# Patient Record
Sex: Female | Born: 1950 | Race: White | Hispanic: No | Smoking: Former smoker
Health system: Southern US, Community
[De-identification: ages and names within clinical notes are randomized; demographics above are authoritative.]

## PROBLEM LIST (undated history)

## (undated) DIAGNOSIS — I4819 Other persistent atrial fibrillation: Secondary | ICD-10-CM

## (undated) DIAGNOSIS — C50919 Malignant neoplasm of unspecified site of unspecified female breast: Secondary | ICD-10-CM

## (undated) DIAGNOSIS — T7840XA Allergy, unspecified, initial encounter: Secondary | ICD-10-CM

## (undated) DIAGNOSIS — E119 Type 2 diabetes mellitus without complications: Secondary | ICD-10-CM

## (undated) DIAGNOSIS — S329XXA Fracture of unspecified parts of lumbosacral spine and pelvis, initial encounter for closed fracture: Secondary | ICD-10-CM

## (undated) DIAGNOSIS — K644 Residual hemorrhoidal skin tags: Secondary | ICD-10-CM

## (undated) DIAGNOSIS — Z9889 Other specified postprocedural states: Secondary | ICD-10-CM

## (undated) DIAGNOSIS — D175 Benign lipomatous neoplasm of intra-abdominal organs: Secondary | ICD-10-CM

## (undated) DIAGNOSIS — Z8679 Personal history of other diseases of the circulatory system: Secondary | ICD-10-CM

## (undated) DIAGNOSIS — J9600 Acute respiratory failure, unspecified whether with hypoxia or hypercapnia: Secondary | ICD-10-CM

## (undated) DIAGNOSIS — D126 Benign neoplasm of colon, unspecified: Secondary | ICD-10-CM

## (undated) DIAGNOSIS — I4892 Unspecified atrial flutter: Secondary | ICD-10-CM

## (undated) DIAGNOSIS — I10 Essential (primary) hypertension: Secondary | ICD-10-CM

## (undated) DIAGNOSIS — N179 Acute kidney failure, unspecified: Secondary | ICD-10-CM

## (undated) DIAGNOSIS — E785 Hyperlipidemia, unspecified: Secondary | ICD-10-CM

## (undated) DIAGNOSIS — F419 Anxiety disorder, unspecified: Secondary | ICD-10-CM

## (undated) DIAGNOSIS — D649 Anemia, unspecified: Secondary | ICD-10-CM

## (undated) DIAGNOSIS — K648 Other hemorrhoids: Secondary | ICD-10-CM

## (undated) DIAGNOSIS — G4730 Sleep apnea, unspecified: Secondary | ICD-10-CM

## (undated) DIAGNOSIS — IMO0001 Reserved for inherently not codable concepts without codable children: Secondary | ICD-10-CM

## (undated) DIAGNOSIS — M797 Fibromyalgia: Secondary | ICD-10-CM

## (undated) DIAGNOSIS — IMO0002 Reserved for concepts with insufficient information to code with codable children: Secondary | ICD-10-CM

## (undated) DIAGNOSIS — R112 Nausea with vomiting, unspecified: Secondary | ICD-10-CM

## (undated) DIAGNOSIS — J449 Chronic obstructive pulmonary disease, unspecified: Secondary | ICD-10-CM

## (undated) DIAGNOSIS — Z86010 Personal history of colonic polyps: Secondary | ICD-10-CM

## (undated) DIAGNOSIS — K449 Diaphragmatic hernia without obstruction or gangrene: Secondary | ICD-10-CM

## (undated) DIAGNOSIS — K219 Gastro-esophageal reflux disease without esophagitis: Secondary | ICD-10-CM

## (undated) DIAGNOSIS — M1990 Unspecified osteoarthritis, unspecified site: Secondary | ICD-10-CM

## (undated) DIAGNOSIS — J189 Pneumonia, unspecified organism: Secondary | ICD-10-CM

## (undated) DIAGNOSIS — K5790 Diverticulosis of intestine, part unspecified, without perforation or abscess without bleeding: Secondary | ICD-10-CM

## (undated) HISTORY — DX: Reserved for concepts with insufficient information to code with codable children: IMO0002

## (undated) HISTORY — DX: Hyperlipidemia, unspecified: E78.5

## (undated) HISTORY — PX: DILATION AND CURETTAGE OF UTERUS: SHX78

## (undated) HISTORY — PX: ROTATOR CUFF REPAIR: SHX139

## (undated) HISTORY — DX: Gastro-esophageal reflux disease without esophagitis: K21.9

## (undated) HISTORY — DX: Diaphragmatic hernia without obstruction or gangrene: K44.9

## (undated) HISTORY — DX: Chronic obstructive pulmonary disease, unspecified: J44.9

## (undated) HISTORY — DX: Benign lipomatous neoplasm of intra-abdominal organs: D17.5

## (undated) HISTORY — DX: Acute kidney failure, unspecified: N17.9

## (undated) HISTORY — PX: POLYPECTOMY: SHX149

## (undated) HISTORY — DX: Malignant neoplasm of unspecified site of unspecified female breast: C50.919

## (undated) HISTORY — DX: Residual hemorrhoidal skin tags: K64.4

## (undated) HISTORY — PX: TOTAL KNEE ARTHROPLASTY: SHX125

## (undated) HISTORY — DX: Benign neoplasm of colon, unspecified: D12.6

## (undated) HISTORY — DX: Unspecified osteoarthritis, unspecified site: M19.90

## (undated) HISTORY — PX: COLONOSCOPY: SHX174

## (undated) HISTORY — DX: Personal history of colonic polyps: Z86.010

## (undated) HISTORY — PX: JOINT REPLACEMENT: SHX530

## (undated) HISTORY — DX: Fracture of unspecified parts of lumbosacral spine and pelvis, initial encounter for closed fracture: S32.9XXA

## (undated) HISTORY — DX: Anxiety disorder, unspecified: F41.9

## (undated) HISTORY — DX: Anemia, unspecified: D64.9

## (undated) HISTORY — DX: Reserved for inherently not codable concepts without codable children: IMO0001

## (undated) HISTORY — DX: Residual hemorrhoidal skin tags: K64.8

## (undated) HISTORY — PX: UPPER GASTROINTESTINAL ENDOSCOPY: SHX188

## (undated) HISTORY — DX: Unspecified atrial flutter: I48.92

## (undated) HISTORY — DX: Essential (primary) hypertension: I10

## (undated) HISTORY — DX: Allergy, unspecified, initial encounter: T78.40XA

## (undated) HISTORY — DX: Acute respiratory failure, unspecified whether with hypoxia or hypercapnia: J96.00

## (undated) HISTORY — PX: ESOPHAGOGASTRODUODENOSCOPY: SHX1529

## (undated) HISTORY — DX: Diverticulosis of intestine, part unspecified, without perforation or abscess without bleeding: K57.90

## (undated) HISTORY — DX: Other persistent atrial fibrillation: I48.19

---

## 1991-09-26 DIAGNOSIS — D126 Benign neoplasm of colon, unspecified: Secondary | ICD-10-CM

## 1991-09-26 HISTORY — DX: Benign neoplasm of colon, unspecified: D12.6

## 1993-06-30 ENCOUNTER — Encounter: Payer: Self-pay | Admitting: Internal Medicine

## 1999-07-22 ENCOUNTER — Other Ambulatory Visit: Admission: RE | Admit: 1999-07-22 | Discharge: 1999-07-22 | Payer: Self-pay | Admitting: Obstetrics and Gynecology

## 2000-07-26 ENCOUNTER — Encounter (INDEPENDENT_AMBULATORY_CARE_PROVIDER_SITE_OTHER): Payer: Self-pay | Admitting: Gastroenterology

## 2000-10-17 ENCOUNTER — Other Ambulatory Visit: Admission: RE | Admit: 2000-10-17 | Discharge: 2000-10-17 | Payer: Self-pay | Admitting: Obstetrics and Gynecology

## 2001-02-13 ENCOUNTER — Encounter: Admission: RE | Admit: 2001-02-13 | Discharge: 2001-02-13 | Payer: Self-pay | Admitting: Obstetrics and Gynecology

## 2001-02-13 ENCOUNTER — Encounter: Payer: Self-pay | Admitting: Obstetrics and Gynecology

## 2001-05-10 ENCOUNTER — Encounter: Payer: Self-pay | Admitting: Obstetrics and Gynecology

## 2001-05-10 ENCOUNTER — Encounter: Admission: RE | Admit: 2001-05-10 | Discharge: 2001-05-10 | Payer: Self-pay | Admitting: Obstetrics and Gynecology

## 2001-12-18 ENCOUNTER — Emergency Department (HOSPITAL_COMMUNITY): Admission: EM | Admit: 2001-12-18 | Discharge: 2001-12-18 | Payer: Self-pay | Admitting: Emergency Medicine

## 2001-12-18 ENCOUNTER — Encounter: Payer: Self-pay | Admitting: Emergency Medicine

## 2002-02-13 ENCOUNTER — Other Ambulatory Visit: Admission: RE | Admit: 2002-02-13 | Discharge: 2002-02-13 | Payer: Self-pay | Admitting: Obstetrics and Gynecology

## 2002-04-22 ENCOUNTER — Encounter: Admission: RE | Admit: 2002-04-22 | Discharge: 2002-04-22 | Payer: Self-pay | Admitting: Obstetrics and Gynecology

## 2002-04-22 ENCOUNTER — Encounter: Payer: Self-pay | Admitting: Obstetrics and Gynecology

## 2002-05-14 ENCOUNTER — Encounter: Admission: RE | Admit: 2002-05-14 | Discharge: 2002-05-14 | Payer: Self-pay | Admitting: Obstetrics and Gynecology

## 2002-05-14 ENCOUNTER — Encounter: Payer: Self-pay | Admitting: Obstetrics and Gynecology

## 2004-06-02 ENCOUNTER — Other Ambulatory Visit: Admission: RE | Admit: 2004-06-02 | Discharge: 2004-06-02 | Payer: Self-pay | Admitting: Obstetrics and Gynecology

## 2004-08-02 ENCOUNTER — Ambulatory Visit: Payer: Self-pay | Admitting: Internal Medicine

## 2004-09-23 ENCOUNTER — Ambulatory Visit: Payer: Self-pay | Admitting: Internal Medicine

## 2004-10-21 ENCOUNTER — Ambulatory Visit: Payer: Self-pay | Admitting: Internal Medicine

## 2004-12-07 ENCOUNTER — Encounter: Admission: RE | Admit: 2004-12-07 | Discharge: 2004-12-07 | Payer: Self-pay | Admitting: Obstetrics and Gynecology

## 2005-02-01 ENCOUNTER — Other Ambulatory Visit: Admission: RE | Admit: 2005-02-01 | Discharge: 2005-02-01 | Payer: Self-pay | Admitting: Obstetrics and Gynecology

## 2005-02-01 ENCOUNTER — Other Ambulatory Visit: Admission: RE | Admit: 2005-02-01 | Discharge: 2005-02-03 | Payer: Self-pay | Admitting: Physician Assistant

## 2005-12-04 ENCOUNTER — Ambulatory Visit: Payer: Self-pay | Admitting: Internal Medicine

## 2006-05-21 ENCOUNTER — Ambulatory Visit: Payer: Self-pay | Admitting: Internal Medicine

## 2006-09-08 ENCOUNTER — Emergency Department (HOSPITAL_COMMUNITY): Admission: EM | Admit: 2006-09-08 | Discharge: 2006-09-08 | Payer: Self-pay | Admitting: Emergency Medicine

## 2006-09-24 ENCOUNTER — Ambulatory Visit: Payer: Self-pay | Admitting: Internal Medicine

## 2006-10-23 ENCOUNTER — Inpatient Hospital Stay (HOSPITAL_COMMUNITY): Admission: RE | Admit: 2006-10-23 | Discharge: 2006-10-26 | Payer: Self-pay | Admitting: Orthopedic Surgery

## 2006-12-28 ENCOUNTER — Ambulatory Visit: Admission: RE | Admit: 2006-12-28 | Discharge: 2006-12-28 | Payer: Self-pay | Admitting: Orthopedic Surgery

## 2007-01-28 ENCOUNTER — Inpatient Hospital Stay (HOSPITAL_COMMUNITY): Admission: RE | Admit: 2007-01-28 | Discharge: 2007-01-31 | Payer: Self-pay | Admitting: Orthopedic Surgery

## 2007-03-07 ENCOUNTER — Ambulatory Visit (HOSPITAL_COMMUNITY): Admission: RE | Admit: 2007-03-07 | Discharge: 2007-03-07 | Payer: Self-pay | Admitting: Orthopedic Surgery

## 2007-03-07 ENCOUNTER — Ambulatory Visit: Payer: Self-pay | Admitting: Vascular Surgery

## 2007-10-03 ENCOUNTER — Ambulatory Visit: Payer: Self-pay | Admitting: Internal Medicine

## 2007-10-03 ENCOUNTER — Telehealth (INDEPENDENT_AMBULATORY_CARE_PROVIDER_SITE_OTHER): Payer: Self-pay | Admitting: *Deleted

## 2007-10-03 DIAGNOSIS — K219 Gastro-esophageal reflux disease without esophagitis: Secondary | ICD-10-CM | POA: Insufficient documentation

## 2007-10-03 DIAGNOSIS — J45991 Cough variant asthma: Secondary | ICD-10-CM

## 2007-10-10 ENCOUNTER — Ambulatory Visit: Payer: Self-pay | Admitting: Internal Medicine

## 2007-10-17 ENCOUNTER — Ambulatory Visit: Payer: Self-pay | Admitting: Internal Medicine

## 2007-11-20 ENCOUNTER — Encounter: Payer: Self-pay | Admitting: Internal Medicine

## 2008-01-16 ENCOUNTER — Ambulatory Visit: Payer: Self-pay | Admitting: Internal Medicine

## 2008-02-04 ENCOUNTER — Telehealth (INDEPENDENT_AMBULATORY_CARE_PROVIDER_SITE_OTHER): Payer: Self-pay | Admitting: *Deleted

## 2008-06-02 ENCOUNTER — Ambulatory Visit: Payer: Self-pay | Admitting: Internal Medicine

## 2008-06-09 ENCOUNTER — Ambulatory Visit: Payer: Self-pay | Admitting: Internal Medicine

## 2008-06-09 ENCOUNTER — Ambulatory Visit: Payer: Self-pay | Admitting: Pulmonary Disease

## 2008-06-15 ENCOUNTER — Ambulatory Visit: Payer: Self-pay | Admitting: Internal Medicine

## 2008-06-23 ENCOUNTER — Telehealth (INDEPENDENT_AMBULATORY_CARE_PROVIDER_SITE_OTHER): Payer: Self-pay | Admitting: *Deleted

## 2008-07-01 ENCOUNTER — Ambulatory Visit: Payer: Self-pay | Admitting: Internal Medicine

## 2008-07-01 DIAGNOSIS — Z860100 Personal history of colon polyps, unspecified: Secondary | ICD-10-CM | POA: Insufficient documentation

## 2008-07-01 DIAGNOSIS — R1319 Other dysphagia: Secondary | ICD-10-CM

## 2008-09-07 ENCOUNTER — Telehealth: Payer: Self-pay | Admitting: Internal Medicine

## 2008-11-30 ENCOUNTER — Telehealth (INDEPENDENT_AMBULATORY_CARE_PROVIDER_SITE_OTHER): Payer: Self-pay

## 2009-01-15 ENCOUNTER — Telehealth: Payer: Self-pay | Admitting: Internal Medicine

## 2009-02-23 ENCOUNTER — Ambulatory Visit: Payer: Self-pay | Admitting: Internal Medicine

## 2009-07-14 ENCOUNTER — Encounter: Payer: Self-pay | Admitting: Internal Medicine

## 2009-07-19 ENCOUNTER — Ambulatory Visit: Payer: Self-pay | Admitting: Internal Medicine

## 2009-07-19 DIAGNOSIS — R079 Chest pain, unspecified: Secondary | ICD-10-CM

## 2009-08-06 ENCOUNTER — Encounter (INDEPENDENT_AMBULATORY_CARE_PROVIDER_SITE_OTHER): Payer: Self-pay | Admitting: *Deleted

## 2009-08-13 ENCOUNTER — Encounter (INDEPENDENT_AMBULATORY_CARE_PROVIDER_SITE_OTHER): Payer: Self-pay | Admitting: *Deleted

## 2009-08-17 ENCOUNTER — Ambulatory Visit: Payer: Self-pay | Admitting: Internal Medicine

## 2009-08-25 ENCOUNTER — Telehealth: Payer: Self-pay | Admitting: Internal Medicine

## 2009-08-31 ENCOUNTER — Telehealth (INDEPENDENT_AMBULATORY_CARE_PROVIDER_SITE_OTHER): Payer: Self-pay | Admitting: *Deleted

## 2009-11-15 ENCOUNTER — Ambulatory Visit: Payer: Self-pay | Admitting: Internal Medicine

## 2009-11-22 IMAGING — CR DG CHEST 2V
2 series · 2 of 2 positions shown · non-contrast
Comparison: none

CLINICAL DATA: Cough and chronic bronchitis.
 CHEST - 2 VIEW:

[view not recorded (1 of 2)]
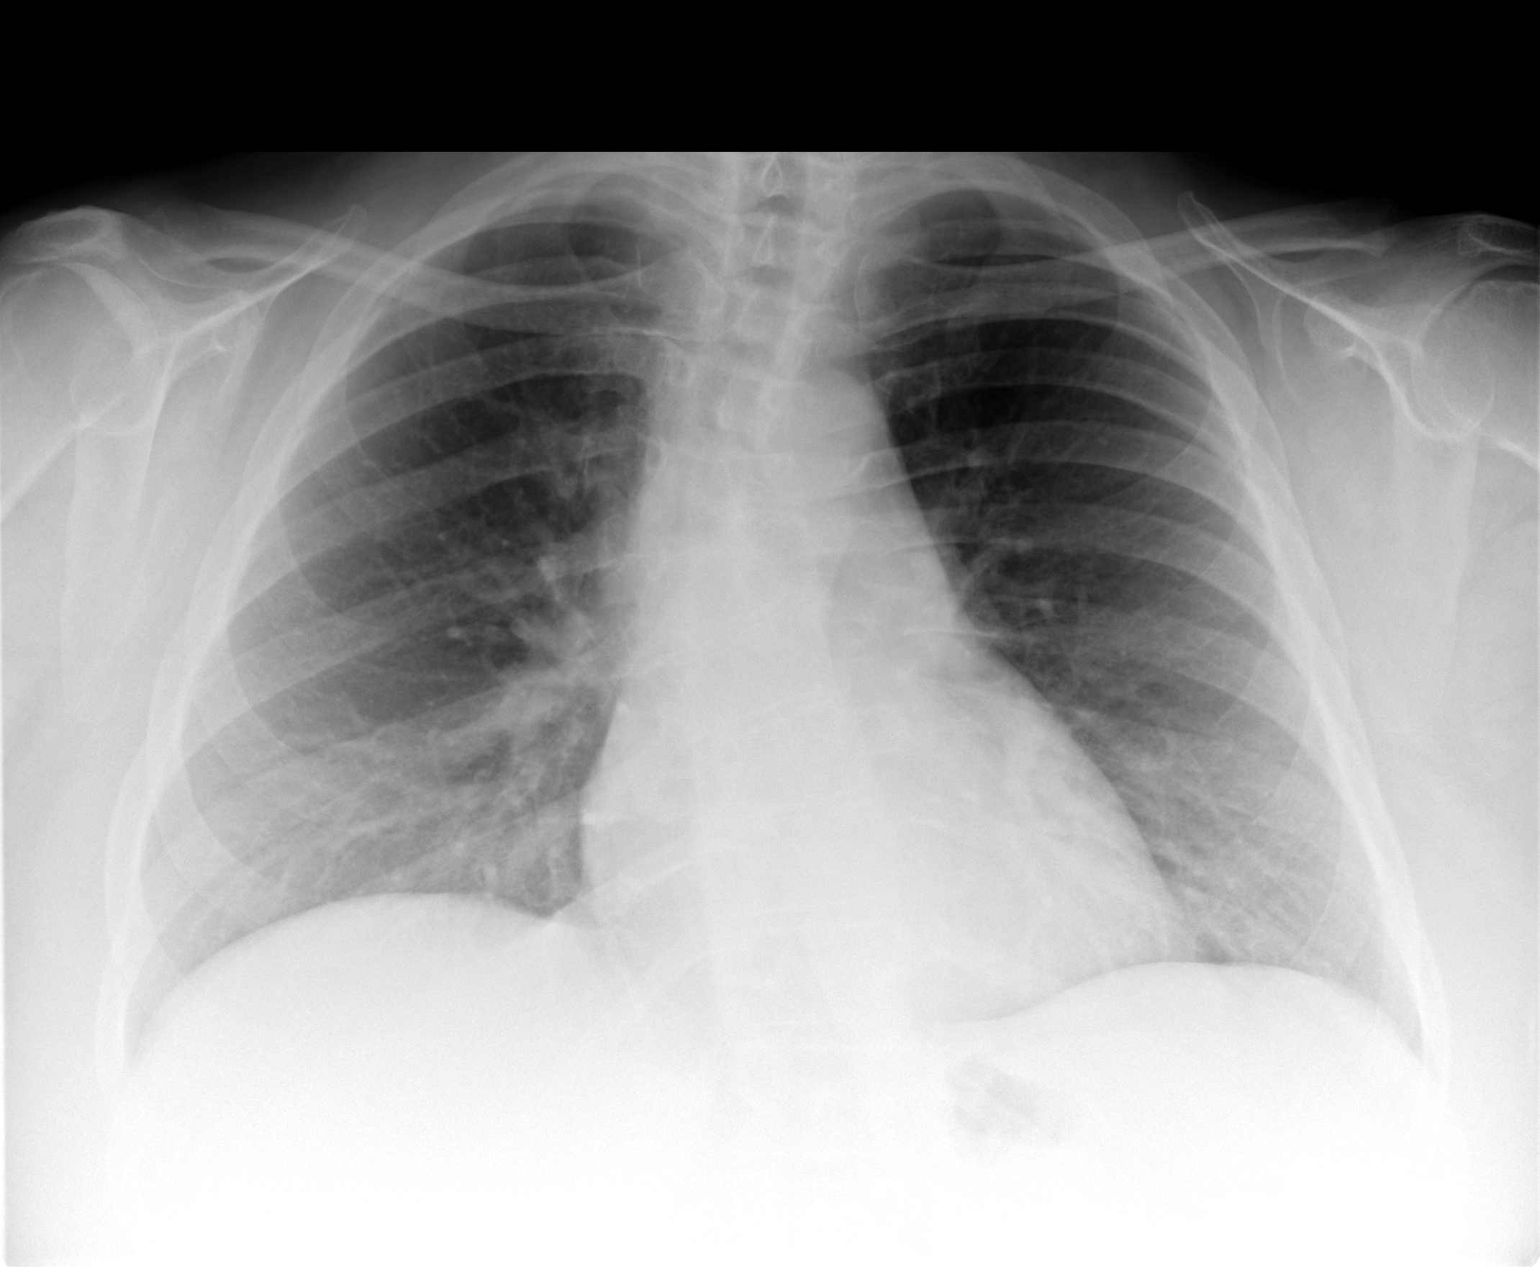

[view not recorded (2 of 2)]
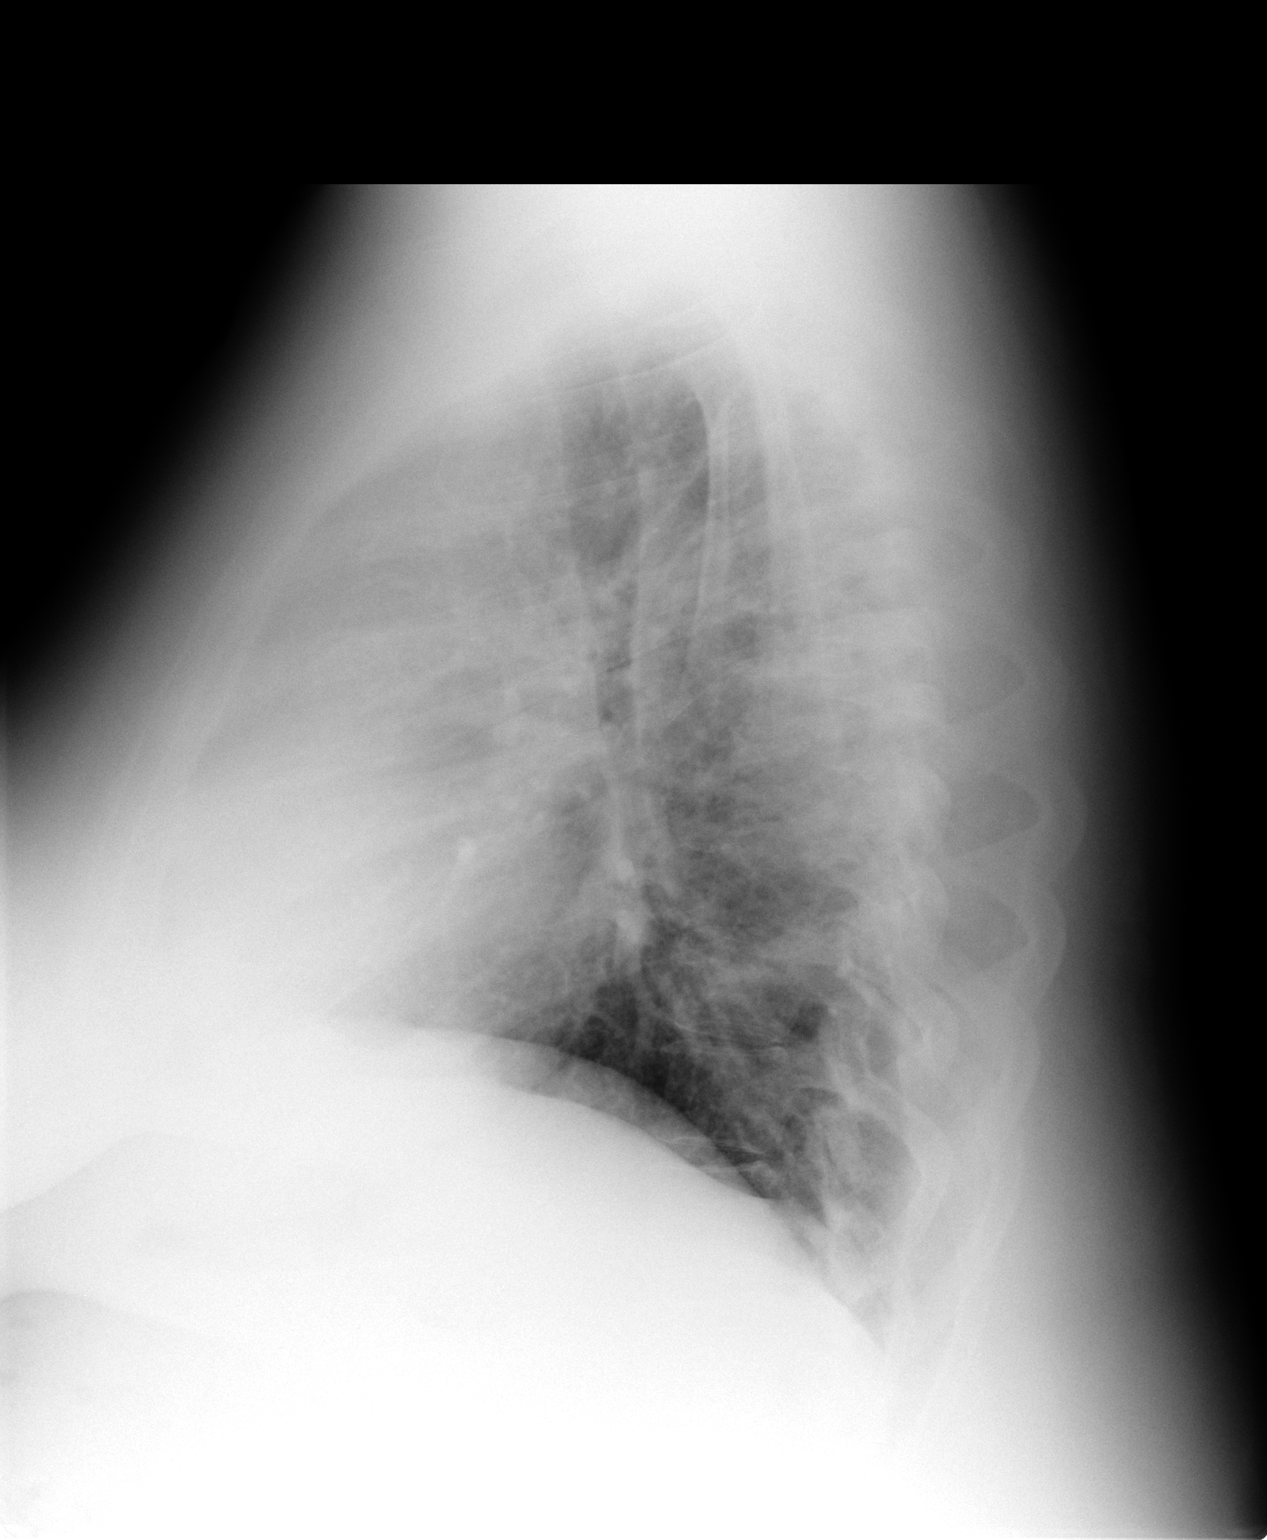

[2 of 2 positions shown; findings below may reference images not displayed]

FINDINGS: Normal mediastinum and cardiac silhouette.  There is scoliosis of the thoracolumbar spine unchanged.  There are low lung volumes.  Normal pulmonary vasculature.
IMPRESSION: No acute cardiopulmonary disease.

## 2010-01-31 ENCOUNTER — Encounter: Payer: Self-pay | Admitting: Internal Medicine

## 2010-02-11 ENCOUNTER — Encounter: Payer: Self-pay | Admitting: Internal Medicine

## 2010-03-21 ENCOUNTER — Encounter (INDEPENDENT_AMBULATORY_CARE_PROVIDER_SITE_OTHER): Payer: Self-pay | Admitting: *Deleted

## 2010-03-21 ENCOUNTER — Ambulatory Visit: Payer: Self-pay | Admitting: Internal Medicine

## 2010-06-13 ENCOUNTER — Telehealth: Payer: Self-pay | Admitting: Internal Medicine

## 2010-06-13 ENCOUNTER — Telehealth (INDEPENDENT_AMBULATORY_CARE_PROVIDER_SITE_OTHER): Payer: Self-pay | Admitting: *Deleted

## 2010-06-14 ENCOUNTER — Ambulatory Visit: Payer: Self-pay | Admitting: Internal Medicine

## 2010-06-14 DIAGNOSIS — Z860100 Personal history of colon polyps, unspecified: Secondary | ICD-10-CM

## 2010-06-14 HISTORY — DX: Personal history of colonic polyps: Z86.010

## 2010-06-14 HISTORY — DX: Personal history of colon polyps, unspecified: Z86.0100

## 2010-06-15 ENCOUNTER — Encounter: Payer: Self-pay | Admitting: Internal Medicine

## 2010-10-25 NOTE — Assessment & Plan Note (Signed)
Summary: ACID REFLUX/ABDOMINAL PAIN--CH.   History of Present Illness Visit Type: Follow-up Visit Primary GI MD: Stan Head MD [facility] Primary Provider: Doreen Beam, MD Requesting Provider: n/a Chief Complaint: dysphagia History of Present Illness:   " I feel like my food doesn't go down and I stay nauseous" She has a fullness in lower chest after she eats. It dissipates over time. No odynophagia.  She has been on prednisone which made her gain weight. She has had COPD problems of late and recently finished prednisone. She also complains of a dry mouth x 6 months.    GI Review of Systems    Reports dysphagia with solids and  nausea.      Denies abdominal pain, acid reflux, belching, bloating, chest pain, dysphagia with liquids, heartburn, loss of appetite, vomiting, vomiting blood, weight loss, and  weight gain.        Denies anal fissure, black tarry stools, change in bowel habit, constipation, diarrhea, diverticulosis, fecal incontinence, heme positive stool, hemorrhoids, irritable bowel syndrome, jaundice, light color stool, liver problems, rectal bleeding, and  rectal pain.    Current Medications (verified): 1)  Nexium 40 Mg  Cpdr (Esomeprazole Magnesium) .... Take 1 Tablet By Mouth Once A Day 2)  Celebrex 200 Mg  Caps (Celecoxib) .... Take 1 Tablet By Mouth Once A Day 3)  Alprazolam 0.5 Mg  Tabs (Alprazolam) .... Once Daily As Needed 4)  Furosemide 20 Mg  Tabs (Furosemide) .... 1/2 As Needed 5)  Symbicort 80-4.5 Mcg/act Aero (Budesonide-Formoterol Fumarate) .... 2 Puffs Two Times A Day 6)  Ventolin Hfa 108 (90 Base) Mcg/act Aers (Albuterol Sulfate) .... Two Puffs As Needed 7)  Vitamin E 600 Unit  Caps (Vitamin E) .... One Tablet By Mouth Once Daily 8)  Vitamin A & D 1000 Iu .... Take 1 Capsule By Mouth Once A Day 9)  Fish Oil 1000 Mg Caps (Omega-3 Fatty Acids) .... One Tablet By Mouth Once Daily 10)  Vitamin C 1000 Mg Tabs (Ascorbic Acid) .... One Tablet By Mouth Once Daily 11)   Ferrous Sulfate 325 (65 Fe) Mg Tabs (Ferrous Sulfate) .... One Tablet By Mouth Once Daily  Allergies (verified): 1)  ! Normand Sloop 2)  ! Epinephrine 3)  ! Darvon  Past History:  Past Medical History: Reviewed history from 07/01/2008 and no changes required. GERD  Asthma Osteoarthritis Degenerative disk disease Pelvic fracture COPD Hiatal hernia Migraines Anxiety Disorder Anemia Obesity Diverticulosis Adenomatous colon polyps  Past Surgical History: Reviewed history from 07/01/2008 and no changes required. 2 Knee Replacements-09/2006 Right rotater cuff  Family History: Reviewed history from 07/01/2008 and no changes required. positivefor emphysema in her father who was a smoker Family History of Breast Cancer: Mother, Sister No FH of Colon Cancer: Family History of Heart Disease: father and sister  Social History: Reviewed history from 07/01/2008 and no changes required. Patient states former smoker.  Alcohol Use - no Illicit Drug Use - no Daily Caffeine Use coffee 1 cup/day Widowed, no children Acoounting tech at [employer]  Review of Systems       recent hip injection  Vital Signs:  Patient profile:   60 year old female Height:      64 inches Weight:      256 pounds BMI:     44.10 BSA:     2.17 Pulse rate:   108 / minute Pulse rhythm:   regular BP sitting:   152 / 80  (left arm) Cuff size:  regular  Vitals Entered By: Ok Anis CMA (March 21, 2010 8:57 AM)  Physical Exam  General:  obese.   Eyes:  anicteric Mouth:  clear oro and posterior pharynx Chest Wall:  non-tender Lungs:  coarse breath sounds but clear Heart:  Regular rate and rhythm; no murmurs, rubs,  or bruits. Abdomen:  obese, some diastasis recti no organomegaly or mass tender upper abdominal wall BS+ Neurologic:  Alert and  oriented x3   Impression & Recommendations:  Problem # 1:  DYSPHAGIA (BJY-782.95) Assessment New Has been a problem in the past but  has now recurred. A bit vague but with prednisone history and GEERD, endoscopic evaluation is appropriate. May need esophageal dilation. Risks, benefits,and indications of endoscopic procedure(s) were reviewed with the patient and all questions answered.  Orders: Colon/Endo (Colon/Endo)  Problem # 2:  TUBULOVILLOUS ADENOMA, COLON, HX OF (ICD-V12.72) Assessment: Unchanged surveillance ande screening is appropriate. Risks, benefits,and indications of endoscopic procedure(s) were reviewed with the patient and all questions answered.  Orders: Colon/Endo (Colon/Endo)  Problem # 3:  SCREENING, COLON CANCER (ICD-V76.51) Assessment: Unchanged she is at higher-risk of colon cancer due to prior polyps. Orders: Colon/Endo (Colon/Endo)  Problem # 4:  ABDOMINAL WALL PAIN (ICD-789.09) Assessment: New This seems to be from coughing to try heating pad, bracing abdomen and possible sportscreme  Patient Instructions: 1)  Please pick up your medications at your pharmacy. MOVIPREP 2)  We will see you at your procedure on  06/14/10. 3)  You may use a heating pad applied to your abdomen for your abdominal wall pain.  You may also apply a sports cream that contains salicylate to the area. 4)  COMFORT KEEPERS--[phone]--www.comfortkeepers.com 5)  HOME INSTEAD--[phone]--www.homeinstead.com 6)  [facility] Patient Information Guide given to patient.  7)  Colonoscopy and Flexible Sigmoidoscopy brochure given.  8)  Upper Endoscopy with Dilatation brochure given.  9)  Copy sent to : Doreen Beam, MD 10)  The medication list was reviewed and reconciled.  All changed / newly prescribed medications were explained.  A complete medication list was provided to the patient / caregiver. Prescriptions: MOVIPREP 100 GM  SOLR (PEG-KCL-NACL-NASULF-NA ASC-C) As per prep instructions.  #1 x 0   Entered by:   Francee Piccolo CMA (AAMA)   Authorized by:   Iva Boop MD, [facility]   Signed by:    Francee Piccolo CMA (AAMA) on 03/21/2010   Method used:   Electronically to        [facility] # [phone]* (retail)       [address]       [place], [place]  [address]       Ph: [phone] or [phone]       Fax: [phone]   RxID:   [id number]

## 2010-10-25 NOTE — Letter (Signed)
Summary: Patient Notice- Polyp Results  [facility]  [address] [place], [place] [address]   Phone: [phone]  Fax: [phone]        June 15, 2010 MRN: [id number]    [name] [address] [place], [place]  [address]    Dear [name],  The polyp removed from your colon was adenomatous. This means that it was pre-cancerous or that  it had the potential to change into cancer over time.  I recommend that you have a repeat colonoscopy in 5 years to determine if you have developed any new polyps over time. If you develop any new rectal bleeding, abdominal pain or significant bowel habit changes, please contact us before then.  Please call us if you are having persistent problems or have questions about your condition that have not been fully answered at this time.  Sincerely,  Iva Boop MD, [facility]  This letter has been electronically signed by your physician.  Appended Document: Patient Notice- Polyp Results letter mailed

## 2010-10-25 NOTE — Letter (Signed)
Summary: [facility] Instructions  [facility]  [address] [place], [place] [address]   Phone: [phone]  Fax: [phone]       [name]    02-23-1951    MRN: [id number]      Procedure Day Dorna Bloom: Jake Shark, 06/14/10     Arrival Time: 7:30 AM      Procedure Time: 8:30 AM    Location of Procedure:                    _X_  [facility] (4th Floor)  PREPARATION FOR COLONOSCOPY WITH MOVIPREP   Starting 5 days prior to your procedure 06/09/10 do not eat nuts, seeds, popcorn, corn, beans, peas,  salads, or any raw vegetables.  Do not take any fiber supplements (e.g. Metamucil, Citrucel, and Benefiber).  THE DAY BEFORE YOUR PROCEDURE         MONDAY, 06/13/10  1.  Drink clear liquids the entire day-NO SOLID FOOD  2.  Do not drink anything colored red or purple.  Avoid juices with pulp.  No orange juice.  3.  Drink at least 64 oz. (8 glasses) of fluid/clear liquids during the day to prevent dehydration and help the prep work efficiently.  CLEAR LIQUIDS INCLUDE: Water Jello Ice Popsicles Tea (sugar ok, no milk/cream) Powdered fruit flavored drinks Coffee (sugar ok, no milk/cream) Gatorade Juice: apple, white grape, white cranberry  Lemonade Clear bullion, consomm, broth Carbonated beverages (any kind) Strained chicken noodle soup Hard Candy                             4.  In the morning, mix first dose of MoviPrep solution:    Empty 1 Pouch A and 1 Pouch B into the disposable container    Add lukewarm drinking water to the top line of the container. Mix to dissolve    Refrigerate (mixed solution should be used within 24 hrs)  5.  Begin drinking the prep at 5:00 p.m. The MoviPrep container is divided by 4 marks.   Every 15 minutes drink the solution down to the next mark (approximately 8 oz) until the full liter is complete.   6.  Follow completed prep with 16 oz of clear liquid of your choice (Nothing red or purple).  Continue to drink  clear liquids until bedtime.  7.   Mix second dose of MoviPrep solution:    Empty 1 Pouch A and 1 Pouch B into the disposable container    Add lukewarm drinking water to the top line of the container. Mix to dissolve    Refrigerate  Beginning at 9:00 p.m. (5 hours before procedure):         1. Every 15 minutes, drink the solution down to the next mark (approx 8 oz) until the full liter is complete.         2. Follow completed prep with 16 oz. of clear liquid of your choice.    THE DAY OF YOUR PROCEDURE      TUESDAY, 06/14/10  1. You may drink clear liquids until 6:30 AM (2 HOURS BEFORE PROCEDURE).  MEDICATION INSTRUCTIONS  Unless otherwise instructed, you should take regular prescription medications with a small sip of water   as early as possible the morning of your procedure.  Additional medication instructions: Hold Ferrous Sulfate 7 days prior to procedure.        OTHER INSTRUCTIONS  You will need  a responsible adult at least 62 years of age to accompany you and drive you home.   This person must remain in the waiting room during your procedure.  Wear loose fitting clothing that is easily removed.  Leave jewelry and other valuables at home.  However, you may wish to bring a book to read or  an iPod/MP3 player to listen to music as you wait for your procedure to start.  Remove all body piercing jewelry and leave at home.  Total time from sign-in until discharge is approximately 2-3 hours.  You should go home directly after your procedure and rest.  You can resume normal activities the  day after your procedure.  The day of your procedure you should not:   Drive   Make legal decisions   Operate machinery   Drink alcohol   Return to work  You will receive specific instructions about eating, activities and medications before you leave.  The above instructions have been reviewed and explained to me by   [facility], CMA    I fully understand and can  verbalize these instructions _____________________________ Date _________

## 2010-10-25 NOTE — Progress Notes (Signed)
Summary: please delete  Phone Note Call from Patient   Summary of Call: Pt called stated was already vomiting up prep- usually receives a prescription for nausea- did not this time Initial call taken by: Oda Cogan RN,  June 13, 2010 3:24 PM     Appended Document: please delete SEE SUBSEQUENT PHONE NOTE

## 2010-10-25 NOTE — Procedures (Signed)
Summary: Upper Endoscopy  Patient: [name] Note: All result statuses are Final unless otherwise noted.  Tests: (1) Upper Endoscopy (EGD)   EGD Upper Endoscopy       DONE     [facility]     [address]     [place], [place]  [address]           ENDOSCOPY PROCEDURE REPORT           PATIENT:  [name], [name]  MR#:  [id number]     BIRTHDATE:  07/03/1951, 59 yrs. old  GENDER:  female           ENDOSCOPIST:  Iva Boop, MD, [facility]     Referred by:  Doreen Beam, M.D.           PROCEDURE DATE:  06/14/2010     PROCEDURE:  EGD, diagnostic     ASA CLASS:  Class III     INDICATIONS:  dysphagia           MEDICATIONS:   Fentanyl 75 mcg IV, Versed 6 mg IV     TOPICAL ANESTHETIC:  Exactacain Spray           DESCRIPTION OF PROCEDURE:   After the risks benefits and     alternatives of the procedure were thoroughly explained, informed     consent was obtained.  The LB GIF-H180 [id number] endoscope was     introduced through the mouth and advanced to the second portion of     the duodenum, without limitations.  The instrument was slowly     withdrawn as the mucosa was fully examined.     <<PROCEDUREIMAGES>>           The upper, middle, and distal third of the esophagus were     carefully inspected and no abnormalities were noted. The z-line     was well seen at the GEJ. The endoscope was pushed into the fundus     which was normal including a retroflexed view. The antrum,gastric     body, first and second part of the duodenum were unremarkable.     Z-line at 42 cm    Retroflexed views revealed no abnormalities.     The scope was then withdrawn from the patient and the procedure     completed.           COMPLICATIONS:  None           ENDOSCOPIC IMPRESSION:     1) Normal EGD     RECOMMENDATIONS:     We both think dysphagia is anxiety-related.     She wants to try cheaper PPI so I prescribed omeprazole 40 mg     each day instead of Nexium           REPEAT EXAM:  In  for as needed.           Iva Boop, MD, [facility]           CC:  Werner Lean, MD     The Patient           n.     Rosalie Doctor:   Iva Boop at 06/14/2010 09:22 AM           Jonnie Kind, [id number]  Note: An exclamation mark (!) indicates a result that was not dispersed into the flowsheet. Document Creation Date: 06/14/2010 9:22 AM _______________________________________________________________________  (1) Order result status: Final Collection or observation date-time: 06/14/2010 08:51 Requested  date-time:  Receipt date-time:  Reported date-time:  Referring Physician:   Ordering Physician: Stan Head [phone]) Specimen Source:  Source: Launa Grill Order Number: [id number] Lab site:

## 2010-10-25 NOTE — Progress Notes (Signed)
Summary: pt vomiting up prep  Phone Note Call from Patient   Summary of Call: Pt vomiting prep- usually receives rx for nausea- did not this time Initial call taken by: Oda Cogan RN,  June 13, 2010 3:28 PM  Follow-up for Phone Call        pt called back and stated did not need anything for nausea-asked if she wanted something in case she needed something later- kept saying would be OK . Told her we would call in something  for just in case  Follow-up by: Oda Cogan RN,  June 13, 2010 4:12 PM    New/Updated Medications: METOCLOPRAMIDE HCL 10 MG  TABS (METOCLOPRAMIDE HCL) 1 by mouth as needed for nausea with colon prep may repeat a dose within 30 minutes and use eery 6 hours as needed Prescriptions: METOCLOPRAMIDE HCL 10 MG  TABS (METOCLOPRAMIDE HCL) 1 by mouth as needed for nausea with colon prep may repeat a dose within 30 minutes and use eery 6 hours as needed  #4 x 0   Entered and Authorized by:   Iva Boop MD, [facility]   Signed by:   Iva Boop MD, FACG on 06/13/2010   Method used:   Electronically to        [facility] [address] # [phone]* (retail)       [address]       [place], [place]  [address]       Ph: [phone] or [phone]       Fax: [phone]   RxID:   [id number]

## 2010-10-25 NOTE — Letter (Signed)
Summary: Certified Return Receipt  Certified Return Receipt   Imported By: Maryln Gottron 02/15/2010 10:06:57  _____________________________________________________________________  External Attachment:    Type:   Image     Comment:   External Document

## 2010-10-25 NOTE — Letter (Signed)
Summary: Generic Letter  [facility]  [address] [place], [place] [address]   Phone: [phone]  Fax: [phone]    01/31/2010  [name] [address] [place] [address] [place], [place]  [address]  Dear [name],  You have repeatedly cancelled or not shown for appointments with me in the past several months. While I recognize that this happens at times, your continued non-compliance with office visits is jeopardizing your care.  I understand that you have arranged another appointment March 21, 2010. Please be aware that failure to show for that appointment without calling in advance and explaining a reason why could result in discharge from my practice as it would indicate that we are unable to maintain an appropriate physician-patient relationship.   Sincerely,   Stan Head MD, [facility]  Appended Document: Generic Letter This letter was mailed certified by Benita Gutter in the Mailroom.

## 2010-10-25 NOTE — Procedures (Signed)
Summary: Colonoscopy  Patient: [name] Note: All result statuses are Final unless otherwise noted.  Tests: (1) Colonoscopy (COL)   COL Colonoscopy           DONE     [facility]     [address]     [place], [place]  [address]           COLONOSCOPY PROCEDURE REPORT           PATIENT:  [name], [name]  MR#:  [id number]     BIRTHDATE:  December 27, 1950, 59 yrs. old  GENDER:  female     ENDOSCOPIST:  Iva Boop, MD, [facility]     REF. BY:  Doreen Beam, M.D.     PROCEDURE DATE:  06/14/2010     PROCEDURE:  Colonoscopy with snare polypectomy     ASA CLASS:  Class III     INDICATIONS:  surveillance and high-risk screening, history of     pre-cancerous (adenomatous) colon polyps prior tubulovillous     adenoma 1993     2001 colonoscopy no polyps     MEDICATIONS:   Fentanyl 25 mcg IV, Versed 4 mg IV           DESCRIPTION OF PROCEDURE:   After the risks benefits and     alternatives of the procedure were thoroughly explained, informed     consent was obtained.  Digital rectal exam was performed and     revealed no abnormalities.   The LB [id number] endoscope     was introduced through the anus and advanced to the cecum, which     was identified by both the appendix and ileocecal valve, without     limitations.  The quality of the prep was good, using MoviPrep.     The instrument was then slowly withdrawn as the colon was fully     examined. Insertion: 2:09 minutes Withdrawal: 9:56 minutes     <<PROCEDUREIMAGES>>           FINDINGS:  A diminutive polyp was found in the ascending colon. It     was 5 mm in size. Polyp was snared without cautery. Retrieval was     successful. snare polyp  A lipoma was found in the distal     transverse colon. It was 1 cm in size.  This was otherwise a     normal examination of the colon.   Retroflexed views in the rectum     revealed internal and external hemorrhoids.    The scope was then     withdrawn from the patient and the  procedure completed.           COMPLICATIONS:  None     ENDOSCOPIC IMPRESSION:     1) 5 mm diminutive polyp in the ascending colon - removed     2) 1 cm lipoma in the distal transverse colon     3) Internal and external hemorrhoids     4) Otherwise normal examination, good prep     5) Prior tubulovillous adenoma 1993           REPEAT EXAM:  In for Colonoscopy, pending biopsy results.           Iva Boop, MD, [facility]           CC:  Doreen Beam, MD     The Patient           n.     eSIGNED:  Iva Boop at 06/14/2010 09:28 AM           Jonnie Kind, [id number]  Note: An exclamation mark (!) indicates a result that was not dispersed into the flowsheet. Document Creation Date: 06/14/2010 9:29 AM _______________________________________________________________________  (1) Order result status: Final Collection or observation date-time: 06/14/2010 09:09 Requested date-time:  Receipt date-time:  Reported date-time:  Referring Physician:   Ordering Physician: Stan Head [phone]) Specimen Source:  Source: Launa Grill Order Number: [id number] Lab site:   Appended Document: Colonoscopy     Procedures Next Due Date:    Colonoscopy: 06/2015

## 2010-10-25 NOTE — Miscellaneous (Signed)
Summary: change nexium to omeprazole  Clinical Lists Changes  Medications: Changed medication from NEXIUM 40 MG  CPDR (ESOMEPRAZOLE MAGNESIUM) Take 1 tablet by mouth once a day to OMEPRAZOLE 40 MG CPDR (OMEPRAZOLE) 1 by mouth once daily 30 minutes before breakfast - Signed Rx of OMEPRAZOLE 40 MG CPDR (OMEPRAZOLE) 1 by mouth once daily 30 minutes before breakfast;  #30 x 11;  Signed;  Entered by: Iva Boop MD, [facility];  Authorized by: Iva Boop MD, [facility];  Method used: Electronically to [facility] # [phone]*, [address] [place], [place]  [address], Ph: [phone] or [phone], Fax: [phone]    Prescriptions: OMEPRAZOLE 40 MG CPDR (OMEPRAZOLE) 1 by mouth once daily 30 minutes before breakfast  #30 x 11   Entered and Authorized by:   Iva Boop MD, [facility]   Signed by:   Iva Boop MD, [facility] on 06/14/2010   Method used:   Electronically to        [facility] # [phone]* (retail)       [address]       [place], [place]  [address]       Ph: [phone] or [phone]       Fax: [phone]   RxID:   [id number]

## 2010-10-31 ENCOUNTER — Ambulatory Visit: Payer: Self-pay | Admitting: Adult Health

## 2010-11-09 ENCOUNTER — Encounter: Payer: Self-pay | Admitting: Internal Medicine

## 2010-11-09 ENCOUNTER — Ambulatory Visit (INDEPENDENT_AMBULATORY_CARE_PROVIDER_SITE_OTHER): Payer: 59 | Admitting: Internal Medicine

## 2010-11-09 DIAGNOSIS — K219 Gastro-esophageal reflux disease without esophagitis: Secondary | ICD-10-CM

## 2010-11-09 DIAGNOSIS — J45909 Unspecified asthma, uncomplicated: Secondary | ICD-10-CM

## 2010-11-09 DIAGNOSIS — R05 Cough: Secondary | ICD-10-CM | POA: Insufficient documentation

## 2010-11-16 NOTE — Assessment & Plan Note (Signed)
Summary: Pulmonary/ acute ext ov wtih dtc asthma ? gerd related    Copy to:  na  Primary Provider/Referring Provider:  Doreen Beam, MD  CC:  Cough and wheezing x 2 months..  History of Present Illness: 57 yowf quit smoking in  1986  with history of Asthma and GERD.   June 02, 2008 --Complains 10 days of cough, congestion- green sputum, wheezing and DOE. Treated mcuinex and advil cold. No better.    Tx with Augmentin for 10 days, predn taper. and changed from QVAR to [employer].     July 19, 2009--Presents for an acute office visit.x 2d.  tripped in bathroom, fell against toilet seat, hitting right ribs on toilet seat. Pain w/ inspiration and very tender to touch along right lateral ribs. No skinbreak. No bruising noted. Has had recent bronchitis tx w/ doxcycline/pred  last week,  better w/ decreased cough adn congestion. No discolored mucus or fever. .Cont on celebrex Warm heat to ribs.  Vicodin 1-2 every 4-6 hrs as needed pain.  November 09, 2010 acute ov Cough and wheezing x 2 months despite symbicort 80 on multiple oil based vitamins and ppi daily but not ac assoc with nasal congestion but no purulent secretions at this time. Pt denies any significant sore throat, dysphagia, itching, sneezing,   fever, chills, sweats, unintended wt loss, pleuritic or exertional cp, hempoptysis, change in activity tolerance  orthopnea pnd or leg swelling .  Pt also denies any obvious fluctuation in symptoms with weather or environmental change or other alleviating or aggravating factors.       Current Medications (verified): 1)  Celebrex 200 Mg  Caps (Celecoxib) .... Take 1 Tablet By Mouth Every Other Day 2)  Alprazolam 0.5 Mg  Tabs (Alprazolam) .... Once Daily As Needed 3)  Furosemide 20 Mg  Tabs (Furosemide) .... 1/2 As Needed 4)  Symbicort 80-4.5 Mcg/act Aero (Budesonide-Formoterol Fumarate) .... 2 Puffs Two Times A Day 5)  Ventolin Hfa 108 (90 Base) Mcg/act Aers (Albuterol Sulfate) .... Two  Puffs As Needed 6)  Vitamin E 600 Unit  Caps (Vitamin E) .... One Tablet By Mouth Once Daily 7)  Vitamin A & D 1000 Iu .... Take 1 Capsule By Mouth Once A Day 8)  Fish Oil 1000 Mg Caps (Omega-3 Fatty Acids) .... One Tablet By Mouth Once Daily 9)  Vitamin C 1000 Mg Tabs (Ascorbic Acid) .... One Tablet By Mouth Once Daily 10)  Ferrous Sulfate 325 (65 Fe) Mg Tabs (Ferrous Sulfate) .... One Tablet By Mouth Once Daily 11)  Omeprazole 40 Mg Cpdr (Omeprazole) .Marland Kitchen.. 1 Once Daily 12)  Vitamin B-12 100 Mcg Tabs (Cyanocobalamin) .Marland Kitchen.. 1 Once Daily 13)  Chromium Picolinate 200 Mcg Tabs (Chromium Picolinate) .Marland Kitchen.. 1  Once Daily  Allergies (verified): 1)  ! Normand Sloop 2)  ! Epinephrine 3)  ! 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 NECK:  Supple w/ fair ROM; no JVD; normal carotid impulses w/o bruits; no thyromegaly or nodules palpated; no lymphadenopathy. CHEST: junky sonorous pan exp  rhonchi bilaterally  HEART:  RRR, no m/r/g  heard ABDOMEN:  Soft & nt; nml bowel sounds; no organomegaly or masses detected. EXT: Warm bilat,  no calf pain, edema, clubbing, pulses intact Skin: no rash/lesion    Impression & Recommendations:  Problem # 1:  ASTHMA (ICD-493.90)  Poorly controlled chronically ? why    DDX of  difficult airways managment all start with A and  include Adherence, Ace Inhibitors, Acid Reflux, Active Sinus Disease, Alpha 1 Antitripsin deficiency, Anxiety masquerading as Airways dz,  ABPA,  allergy(esp in young), Aspiration (esp in elderly), Adverse effects of DPI,  Active smokers, plus two Bs  = Bronchiectasis and Beta blocker use..and one C= CHF    Adherence:  I spent extra time with the patient today explaining optimal mdi  technique.  This improved from  75-90% + p coaching  Acid reflux:  on oil based vits,  not using ppi ac.  See instructions for specific recommendations   Allergy:  w/u on f/u  Acute sinus dz:  sinus ct next step  Orders: Est. Patient Level IV ([address]) HFA Instruction [phone])  Medications Added to Medication List This Visit: 1)  Celebrex 200 Mg Caps (Celecoxib) .... Take 1 tablet by mouth every other day 2)  Omeprazole 40 Mg Cpdr (Omeprazole) .Marland Kitchen.. 1 once daily 3)  Omeprazole 40 Mg Cpdr (Omeprazole) .... Take  one 30-60 min before first meal of the day 4)  Vitamin B-12 100 Mcg Tabs (Cyanocobalamin) .Marland Kitchen.. 1 once daily 5)  Chromium Picolinate 200 Mcg Tabs (Chromium picolinate) .Marland Kitchen.. 1  once daily 6)  Symbicort 160-4.5 Mcg/act Aero (Budesonide-formoterol fumarate) .... 2 puffs first thing  in am and 2 puffs again in pm about 12 hours later 7)  Prednisone 10 Mg Tabs (Prednisone) .... 4 each am x 2days, 2x2days, 1x2days and stop  Other Orders: Flutter Valve (09811)  Patient Instructions: 1)  Symbicort 160 2 puffs first thing  in am and 2 puffs again in pm about 12 hours later  2)  Ventolin is only to be used if needed 3)  Prednsione 10  mg 4 each am x 2days, 2x2days, 1x2days and stop 4)  Omeprazole 40 mg  Take  one 30-60 min before first meal of the day also take Pepcid 20 mg one at bedtime as long as flare in resp symptoms 5)  use flutter whenever coughing 6)  Mucinex dm 2 every 12 hours if cough 7)  GERD (REFLUX)  is a common cause of respiratory symptoms. It commonly presents without heartburn and can be treated with medication, but also with lifestyle changes including avoidance of late meals, excessive alcohol, smoking cessation, and avoid fatty foods, chocolate, peppermint, colas, red wine, and acidic juices such as orange juice. NO MINT  OR MENTHOL PRODUCTS SO NO COUGH DROPS  8)  USE SUGARLESS CANDY INSTEAD (jolley ranchers)  9)  NO OIL BASED VITAMINS  10)  Please schedule a follow-up appointment in 4 weeks, sooner if needed  with PFTs Prescriptions: PREDNISONE 10 MG  TABS (PREDNISONE) 4 each am x 2days, 2x2days, 1x2days and stop  #14 x 0   Entered and Authorized by:   Nyoka Cowden MD   Signed by:   Nyoka Cowden MD on 11/09/2010   Method used:   Electronically to        [facility] [address] # [phone]* (retail)       [address]       [place], [place]  [address]       Ph: [phone] or [phone]       Fax: [phone]   RxID:   [id number] SYMBICORT 160-4.5 MCG/ACT  AERO (BUDESONIDE-FORMOTEROL FUMARATE) 2 puffs first thing  in am and 2 puffs again in pm about 12 hours later  #1 x 11   Entered and Authorized by:   Nyoka Cowden MD   Signed by:   Nyoka Cowden MD on 11/09/2010   Method used:   Electronically to        [facility] [address] # [phone]* (retail)       [address]       [place], [place]  [address]       Ph: [phone] or [phone]       Fax: [phone]   RxID:   [id number]

## 2010-12-08 ENCOUNTER — Ambulatory Visit: Payer: 59 | Admitting: Internal Medicine

## 2010-12-21 ENCOUNTER — Encounter: Payer: Self-pay | Admitting: Internal Medicine

## 2010-12-28 ENCOUNTER — Ambulatory Visit (INDEPENDENT_AMBULATORY_CARE_PROVIDER_SITE_OTHER): Payer: 59 | Admitting: Internal Medicine

## 2010-12-28 ENCOUNTER — Encounter: Payer: Self-pay | Admitting: Internal Medicine

## 2010-12-28 VITALS — BP 164/70 | HR 108 | Temp 98.0°F | Ht 65.0 in | Wt 262.0 lb

## 2010-12-28 DIAGNOSIS — R05 Cough: Secondary | ICD-10-CM

## 2010-12-28 DIAGNOSIS — J45909 Unspecified asthma, uncomplicated: Secondary | ICD-10-CM

## 2010-12-28 LAB — PULMONARY FUNCTION TEST

## 2010-12-28 NOTE — Patient Instructions (Addendum)
Ok to try symbicort 160 1-2 every 12 hours  For drainage try chlortrimeton 4mg  one every 6 hours  Your lung function tests are very good with no smoking damage but a major impact by weight  Weight control is simply a matter of calorie balance which needs to be tilted in your favor by eating less and exercising more.  To get the most out of exercise, you need to be continuously aware that you are short of breath, but never out of breath, for 30 minutes daily. As you improve, it will actually be easier for you to do the same amount of exercise  in  30 minutes so always push to the level where you are short of breath.  If this does not result in gradual weight reduction then I strongly recommend you see a nutritionist with a food diary x 2 weeks so that we can work out a negative calorie balance which is universally effective in steady weight loss programs.  Think of your calorie balance like you do your bank account where in this case you want the balance to go down so you must take in less calories than you burn up.  It's just that simple:  Hard to do, but easy to understand.  Good luck!   Follow up as needed

## 2010-12-28 NOTE — Assessment & Plan Note (Signed)
All goals of chronic asthma control met including optimal function and elimination of symptoms with minimal need for rescue therapy.  Contingencies discussed in full including contacting this office immediately if not controlling the symptoms using the rule of two's.       Each maintenance medication was reviewed in detail including most importantly the difference between maintenance and as needed and under what circumstances the prns are to be used.  Please see instructions for details which were reviewed in writing and the patient given a copy.   

## 2010-12-28 NOTE — Assessment & Plan Note (Signed)
Discussed pft's and impact of weight as well as strategy to address this.

## 2010-12-28 NOTE — Progress Notes (Signed)
PFT done today. 

## 2010-12-28 NOTE — Progress Notes (Signed)
Subjective:    Patient ID: [name], female    DOB: 1950/10/29, 60 y.o.   MRN: [id number]  HPI  38 yowf quit smoking in 1986 with history of Asthma and GERD.   June 02, 2008 --Complains 10 days of cough, congestion- green sputum, wheezing and DOE. Treated mcuinex and advil cold. No better. Tx with Augmentin for 10 days, predn taper. and changed from QVAR to [employer].   July 19, 2009--Presents for an acute office visit.x 2d. tripped in bathroom, fell against toilet seat, hitting right ribs on toilet seat. Pain w/ inspiration and very tender to touch along right lateral ribs. No skinbreak. No bruising noted. Has had recent bronchitis tx w/ doxcycline/pred last week, better w/ decreased cough adn congestion. No discolored mucus or fever. .Cont on celebrex  Warm heat to ribs.  Vicodin 1-2 every 4-6 hrs as needed pain.   November 09, 2010 acute ov Cough and wheezing x 2 months despite symbicort 80 on multiple oil based vitamins and ppi daily but not ac assoc with nasal congestion but no purulent secretions at this time.  1) Symbicort 160 2 puffs first thing in am and 2 puffs again in pm about 12 hours later  2) Ventolin is only to be used if needed  3) Prednsione 10 mg 4 each am x 2days, 2x2days, 1x2days and stop  4) Omeprazole 40 mg Take one 30-60 min before first meal of the day also take Pepcid 20 mg one at bedtime as long as flare in resp symptoms  5) use flutter whenever coughing  6) Mucinex dm 2 every 12 hours if cough  7) GERD (REFLUX) diet  12/28/2010 ov cc cough and breathing much better on rx.  Pt denies any significant sore throat, dysphagia, itching, sneezing,  nasal congestion or excess/ purulent secretions,  fever, chills, sweats, unintended wt loss, pleuritic or exertional cp, hempoptysis, orthopnea pnd or leg swelling.    Also denies any obvious fluctuation of symptoms with weather or environmental changes or other aggravating or alleviating factors.  No noct  co's or need for rescue   12/28/2010 ov much better on symbicort, still pnds  Current Medications (verified):  1) Celebrex 200 Mg Caps (Celecoxib) .... Take 1 Tablet By Mouth Every Other Day       Allergies 1) ! Normand Sloop  2) ! Epinephrine  3) ! Darvon    Past Medical History:  GERD  -Pos EGD with HH/ esophagitis 06/30/93  Asthma  - Spirometry 1989: FEV1 2.12 ( 71%) with ratio 82  - HFA 90% p coaching November 09, 2010  -PFT's nl 12/28/2010 x ERV 22% Osteoarthritis  Degenerative disk disease  Pelvic fracture  COPD  Hiatal hernia  Migraines  Anxiety Disorder  Anemia  Obesity  Diverticulosis  Adenomatous colon polyps   Social History:  Patient states former smoker quit in 1986  Alcohol Use - no  Illicit Drug Use - no  Daily Caffeine Use coffee 1 cup/day  Widowed, no children  Sales executive at [employer]      Review of Systems     Objective:   Physical Exam    GENERAL: A/Ox3; pleasant & cooperative.NAD  wt 256 > 266 November 09, 2010  > 262 12/28/2010  HEENT: [place]/AT, EOM-wnl, PERRLA, EACs-clear, TMs-wnl, NOSE pale mucosa, THROAT-clear & wnl.  NECK: Supple w/ fair ROM; no JVD; normal carotid impulses w/o bruits; no thyromegaly or nodules palpated; no lymphadenopathy.  CHEST: junky sonorous pan exp rhonchi bilaterally  HEART: RRR, no m/r/g heard  ABDOMEN: Soft & nt; nml bowel sounds; no organomegaly or masses detected.  EXT: Warm bilat, no calf pain, edema, clubbing, pulses intact    Assessment & Plan:

## 2011-01-17 ENCOUNTER — Encounter: Payer: Self-pay | Admitting: Internal Medicine

## 2011-02-07 NOTE — Discharge Summary (Signed)
NAMESUSI, GOSLIN NO.:  [id number]   MEDICAL RECORD NO.:  [id number]          PATIENT TYPE:  INP   LOCATION:  1610                         FACILITY:  [facility]   PHYSICIAN:  Madlyn Frankel. Charlann Boxer, M.D.  DATE OF BIRTH:  Jun 19, 1951   DATE OF ADMISSION:  01/28/2007  DATE OF DISCHARGE:  01/31/2007                               DISCHARGE SUMMARY   ADMITTING DIAGNOSES:  1. Osteoarthritis.  2. Degenerative disk disease.  3. Pelvic fracture.  4. Chronic obstructive pulmonary disease.  5. Reflux.  6. Asthma.  7. Hiatal hernia.  8. Migraines.  9. Anxiety.  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   DISCHARGE DIAGNOSIS:  1. Osteoarthritis.  2. Degenerative disk disease.  3. Pelvic fracture.  4. Chronic obstructive pulmonary disease.  5. Reflux.  6. Asthma.  7. Hiatal hernia.  8. Migraines.  9. Anxiety.  10.Right total knee replacement.  11.Left total knee replacement.  12.Acute blood loss anemia.   CONSULTATIONS:  None.   PROCEDURE:  Left total knee replacement.  Surgeon Dr. Durene Romans.  Assistant Dwyane Luo, PA-C.   HISTORY OF PRESENT ILLNESS:  This is a 60 year old female with a history  of persistent progressive left knee pain with a history of right total  knee replacement.  Left knee pain has been persistent and progressive in  nature and has been unrelieved with conservative treatment including  anti-inflammatories and injections.  She even had a fall and had to have  the knee aspirated and injected.  She was presurgically assessed by Dr.  Weyman Pedro.   LABORATORY DATA:  Preoperative CBC - hematocrit was 28.4, platelets 476.  On postoperative day #1, hematocrit 23.1, transfused 2 units; next day  her hematocrit was 29.2 and platelets were down to 347.  Preadmission  coags were within normal limits.  Preadmission chemistry all within  normal limits.  On postoperative day #1 sodium 134.  On postoperative  day #2, glucose 114 and stable.  GFR  within normal limits.  Calcium no  significant trending.  Routine chemistries- albumin 3.4.  UA negative.  Type and cross A+, transfused 2 units packed red blood cells on Jan 29, 2007.   ELECTROCARDIOGRAM:  Normal sinus rhythm.   CHEST X-RAY:  A chest x-ray on September 24, 2006 showed chronic  peribronchial thickening, no active chest process.  No abnormal changes.   HOSPITAL COURSE:  The patient underwent left total knee replacement,  tolerated the procedure well and was admitted to the orthopedic floor.  The pain was well-controlled throughout.  She remained neuromuscularly  and vascularly intact throughout.  The dressing was changed after  postoperative day #1.  On postoperative day #1, she had had a headache  all evening, some cough and chest congestion.  Her hematocrit was 23.1,  but she was afebrile.  She was able to do straight leg raise.  For the  acute blood loss anemia, we gave her 2 units of packed red blood cells.  For the headache, we gave her some Dilaudid.  We gave her some  dextromethorphan for her cough and maintained the Foley until  after the  transfusion.  PT assessment was done and recommended home health care  PT.  [facility] was selected at that time.   On postoperative day #2, the headache was resolved, hematocrit was back  up to 29.2.  Dressing was changed.  There was no active drainage.  She  continued to be able to do a straight leg raise.  She was  neuromuscularly and vascularly intact.  She had walked 150 feet the day  before, weightbearing as tolerated.   On postoperative day #3, she was doing well.  Headache was improved and  she was doing okay.  She did have some left knee pain but the left was  dry.  Dressing was changed.  No active drainage.  Positive straight leg  raise.  Ready for discharge home after physical therapy with a return to  our office subsequently.   DISCHARGE DISPOSITION:  Discharged home with home health care PT and  Lovenox  administration.   DISCHARGE DIET:  No restrictions.   WOUND CARE:  Keep dressing dry, change dressing daily.   DISCHARGE ACTIVITY:  Weightbearing as tolerated with the use of a  rolling walker.   DISCHARGE FOLLOWUP:  Follow up with Dr. Charlann Boxer, [phone] in 2 weeks.   DISCHARGE PHYSICAL THERAPY:  Goals of physical therapy - gait training,  proprioception, minimize pain, maximize range of motion, minimize pain,  increase strength.  Encourage activities of daily living.   DISCHARGE MEDICATIONS:  1. Nexium 40 mg p.o. q.a.m.  2. AeroBid inhaler 2 puffs twice a day.  3. Albuterol p.r.n.  4. Furosemide 20 mg p.o. 2-3 times a week as needed.  5. Alprazolam 0.5 mg p.o. p.r.n.  6. Lexapro 10 mg p.o. q.a.m.  7. Lovenox 30 mg one subcutaneous q.12h. x11 days.  8. Robaxin 500 mg one p.o. q.6h. muscle spasm.  9. Vicodin 5/325 one to two p.o. q.4-6h. p.r.n. pain.  10.Iron 325 mg one p.o. t.i.d. x2 weeks.  11.Enteric coated aspirin 325 mg one p.o. daily x3 weeks after Lovenox      completed.  12.Colace 100 mg one p.o. b.i.d. constipation.     ______________________________  Yetta Glassman [address], [place]      Madlyn Frankel. Charlann Boxer, M.D.  Electronically Signed    BLM/MEDQ  D:  02/13/2007  T:  02/13/2007  Job:  [id number]

## 2011-02-10 NOTE — Discharge Summary (Signed)
[name], BILLING NO.:  [id number]   MEDICAL RECORD NO.:  [id number]          PATIENT TYPE:  INP   LOCATION:  1618                         FACILITY:  [facility]   PHYSICIAN:  Madlyn Frankel. Charlann Boxer, M.D.  DATE OF BIRTH:  [birth year]   DATE OF ADMISSION:  10/23/2006  DATE OF DISCHARGE:  10/26/2006                               DISCHARGE SUMMARY   ADMISSION DIAGNOSES:  1. Migraines.  2. Anxiety and depression.  3. Asthma.  4. Bronchitis.  5. Chronic obstructive pulmonary disease.  6. Reflux.  7. Osteoarthritis.   DISCHARGE DIAGNOSES:  1. Migraines.  2. Anxiety and depression.  3. Asthma.  4. Bronchitis.  5. Chronic obstructive pulmonary disease.  6. Reflux.  7. Osteoarthritis.   CONSULTANTS:  None.   PROCEDURE:  Right total knee replacement.  Surgeon:  Madlyn Frankel. Charlann Boxer,  M.D.  Assistant:  Dwyane Luo, PA-C.   HISTORY OF PRESENT ILLNESS:  [name] is a very pleasant female with  a history of bilateral knee osteoarthritis.  She had been refractory to  all conservative treatments.  She has a decreased quality of life and  was scheduled for a total knee replacement.   PAST SURGICAL HISTORY:  1. Colposcopy and D&C in 1980.  2. Left rotator cuff repair in 2004.  3. Left knee arthroscopy in 2007.   Preadmission labs:  CBC:  Hemoglobin 10.5, hematocrit 31.9.  tracked  throughout the course of the day.  Hemoglobin 9.5, hematocrit 28.5  platelets 430.  January 31, hemoglobin 8.9, hematocrit 26.45, platelets  408.  On February 1, hemoglobin 8.1, hematocrit 24.1, platelets 388.  Preadmission white cell differential within normal limits.  Preadmission  coagulation INR 0.9.  Routine chemistry:  Sodium 139, glucose 105,  potassium 3.9.  tracked throughout her course of stay.  Sodium 138,  potassium 3.6, glucose 149.  The next day 141 sodium, potassium 4.0,  glucose 134.  Upon her discharge, glucose 104, sodium 140, potassium  4.1.  Preadmission urinalysis was  negative.   EKG normal sinus rhythm.  Chest two-view showed chronic peribronchial  thickening.  No active chest process.  No abnormal change.   HOSPITAL COURSE:  The patient underwent a right total knee replacement  and tolerated procedure well.  Was admitted to the orthopedic floor.  Postop day #1, the patient was doing fine, pain was well-controlled.  She was afebrile.  She was neuromuscularly and vascularly intact.  She  could do a positive straight leg raise.  PT/OT was done.  Lovenox was  started for DVT prophylaxis.  PT evaluation done.  The patient was able  to ambulate 100 feet.  Recommended for home health care PT with [facility].   Postop day #2, the patient was doing well.  Dressing was changed.  There  was some dried blood.  There was no active ooze, no sign of infection.  She was neuromuscularly and vascularly intact, continued to be positive  straight leg raise.  PT/OT was begun with weightbearing as tolerated,  discontinued Foley and plan to discharge on Friday.  Postop day #3, the  patient was doing  well, ready for discharge home, and she was  hemodynamically and orthopedically stable.   DISCHARGE DISPOSITION:  Discharged home in stable and improved condition  with home health care PT.   DISCHARGE MEDICATIONS:  1. Lovenox 30 mg one p.o. q.12h. x11 days.  2. Percocet 5/325 mg one to two p.o. q.4-6h. p.r.n. pain.  3. Robaxin one to two p.o. q.4-6h. p.r.n. muscle spasm or pain.  4. Celebrex 200 mg one p.o. daily x2 weeks.  5. Colace 100 mg one p.o. b.i.d. p.r.n. constipation.  6. MiraLax 17 g one scoop p.o. daily.  7. Enteric-coated aspirin 325 mg one p.o. daily after Lovenox finished      x4 weeks.  8. Iron 325 mg x3 daily x3 weeks.   DISCHARGE HOME MEDICATIONS:  1. Nexium 40 mg one p.o. q.a.m.  2. AeroBid inhaler two puffs q.a.m. and q.p.m.  3. Albuterol inhaler p.r.n. and q.p.m.  4. Albuterol inhaler p.r.n.  5. Furosemide 20 mg one p.o. p.r.n.  swelling.  6. Alprazolam 0.5 mg one p.o. p.r.n. anxiety.   DISCHARGE PHYSICAL THERAPY:  The patient is weightbearing as tolerated  with use of rolling walker, 7 times per week.  The patient was able to  ambulate over 50 feet upon discharge.  Goals of therapy are going to be  maximize strength, increase range of motion, decrease pain and encourage  independence in activities of daily living.   DISCHARGE WOUND CARE:  Keep wound dry and change dressing daily.   DISCHARGE FOLLOWUP:  Follow up with Dr. Charlann Boxer, [phone], in 2 weeks for  wound care check.     ______________________________  Yetta Glassman. [address], [place]      Madlyn Frankel. Charlann Boxer, M.D.  Electronically Signed    BLM/MEDQ  D:  11/30/2006  T:  11/30/2006  Job:  [id number]

## 2011-02-10 NOTE — Op Note (Signed)
NAMESHANIK, [name]:  [id number]   MEDICAL RECORD NO.:  [id number]          PATIENT TYPE:  INP   LOCATION:  0098                         FACILITY:  [facility]   PHYSICIAN:  Shelley Frankel. Charlann Murphy, M.D.  DATE OF BIRTH:  22-Oct-1950   DATE OF PROCEDURE:  10/23/2006  DATE OF DISCHARGE:                               OPERATIVE REPORT   PREOPERATIVE DIAGNOSIS:  End stage right knee osteoarthritis.   POSTOPERATIVE DIAGNOSIS:  End stage right knee osteoarthritis.   PROCEDURE:  Right total knee replacement with Dupuy rotating platform  posterior stabilized knee system, size 2.5 femur, size 3 tibia, 10 mm  insert, and a 38 patellar button.   SURGEON:  Shelley Frankel. Charlann Murphy, M.D.   ASSISTANT:  Yetta Glassman. Mann, P.A.-C.   ANESTHESIA:  Duramorph spinal.   ESTIMATED BLOOD LOSS:  Minimal.   TOURNIQUET TIME:  45 minutes at 250 mmHg.   DRAINS:  One.   COMPLICATIONS:  None.   INDICATIONS FOR PROCEDURE:  [name] is a pleasant 60 year old female  kindly referred for surgical consideration due to her medical  conditions.  She has had end stage right knee osteoarthritis with  progressive pain failing conservative measures.  She had already thought  and hoped to have knee replacement surgery but had some concerns about  the risks  involved.  The risks of infection, DVT, perioperative  comorbidities related to her medical conditions were all reviewed,  consent was obtained.  We reviewed the postoperative course and the fact  that she is a young individual.   PROCEDURE IN DETAIL:  The patient was brought to the operating theater.  Once adequate anesthesia and preoperative antibiotics were administered,  1 gram Ancef, the patient was positioned supine.  A proximal thigh  tourniquet was placed on the right lower extremity.  The right lower  extremity was then pre-scrubbed and then prepped and draped in a sterile  fashion.  A midline incision was made followed by a modified median  parapatellar arthrotomy with patellar subluxation around the inversion.  Following the exposure, partial meniscectomies of remaining tissue  present there.   Attention was first directed to the patella.  Pre-cut measurement was  only 21 mm.  I resected down to 14 mm and resurfaced using a 38 patellar  button, this restored it back to about a 23 mm patellar height.  Following the cut surface, I drew the lug holes in the superior medial  aspect of the patella.   At this point, attention was redirected back to the knee.  Femoral  exposure was obtained including notch osteophyte debridement.  I then  drilled an opening into the femoral canal, irrigated the canal to  prevent fat emboli, and then passed an intramedullary rod.  At 5 degrees  of valgus, I resected 10 mm of bone off her distal femur.  She was noted  to have significant tricompartmental degenerative changes noted.  I then  measured the femur and determined that an anterior lateral cortex it was  a size 2.5 femur.  I went ahead and drilled these holes and this  corresponded perpendicular to white  signs line based off the posterior 3  degrees of the posterior condyles.  I went ahead and cut the anterior,  posterior, and chamfer cuts without complications. The trochlear box cut  was then made off the lateral aspect of the distal femur.   At this point, attention was directed to the tibia.  The tibial exposure  was obtained with tibial subluxation rather than dislocation.  Further  meniscectomy was carried out as needed.  I went ahead with the  extramedullary guide resected 8 mm of bone off the neutral position of  her tibia off the lateral tibia.  I then, with the extension block at 10  mm, I checked the extension block and the knee came out to full  extension.  At this point, I went ahead and placed a size 3 tibial tray  and determined it was a size 3.  I went ahead and pinned it in its  orientation and checked the alignment and I  was happy that it passed  through the ankle in the AP and lateral planes perpendicular.   Trial reduction was then carried out with a 2.5 femur, 3 tibia, and the  10 insert, and the knee came out to full extension, had good stable  ligaments from extension to flexion.  The 38 patella was placed and  tracked without application of any pressure.  At this point, all trials  were removed and the wound was copiously irrigated with normal saline  solution while the final components were opened on the back field.  I  injected the knee with 60 mL of Marcaine with epinephrine and 1 mL of  Toradol.  When the cement was ready on the back table, we cemented the  components in position, first tibia, then femur, then the patella.  The  cement was allowed to cure with the knee out of extension.  Excess  cement was debrided.  Once the cement had cured, excessive cement was  debrided off the posterior aspect of the knee.  Once I was satisfied  there was no visualized cement present, I placed a final 10 insert based  on my trial reductions.   At this point, the knee was irrigated again with about 300 mL of normal  saline solution.  A medium Hemovac drain was placed deep.  I  reapproximated the extensor mechanism in flexion with #1 Vicryl.  I ran  a 2-0 Vicryl in the subcu layer and ran a 4-0 Monocryl.  Her skin was  noted to be very thin, however, the 4-0 Monocryl closed the wound  nicely.  The wound was cleaned, dried, and dressed sterilely with Steri-  Strips, dressing sponge, and tape.  She was brought to the recovery room  in stable condition.      [name], M.D.  Electronically Signed     MDO/MEDQ  D:  10/23/2006  T:  10/23/2006  Job:  [id number]

## 2011-02-10 NOTE — Assessment & Plan Note (Signed)
[name]                               PULMONARY OFFICE NOTE   AYRABELLA, LABOMBARD ANN                 MRN:          [id number]  DATE:05/22/2006                            DOB:          13-Dec-1950    PRIMARY SERVICE/ACUTE OFFICE EVALUATION:   HISTORY:  This is a 60 year old female who got sick in [place] and then  flew home with this a week ago with severe nasal congestion, which is green  in the morning, without any significant fever but now is having a hacking,  dry cough for the last week as well with no purulence, with no pleuritic [name]  exertional chest pain, orthopnea, PND [name] leg swelling.   For a full list of medications please see face sheet of May 21, 2006.   PHYSICAL EXAMINATION:  GENERAL:  She is a somewhat depressed-appearing  ambulatory white female in no acute distress.  VITAL SIGNS:  Stable vital signs.  Weight is 209 pounds.  It is up about 20  pounds from baseline.  HEENT:  Remarkable for a moderate nonspecific turbinate edema.  Oropharynx  is clear.  NECK:  Supple without cervical adenopathy [name] tenderness.  The trachea is  midline.  LUNGS:  Diminished breath sounds with a few rhonchi on FVC maneuver.  CARDIAC:  There is regular rate and rhythm without murmur, gallop, [name] rub.  ABDOMEN:  Soft, benign.  EXTREMITIES:  Warm without calf tenderness, cyanosis, clubbing [name] edema.   IMPRESSION:  Acute rhinitis, tracheobronchitis, complicated by recent air  travel.   RECOMMENDATIONS:  1. Omnicef, Omni Pack #1, to be followed by second pack if all purulent      secretions are not eliminated after 5 days.  2. Increase Nexium up to b.i.d. for any exacerbation of cough as      emphasized on previous evaluations.  3. Prednisone 10 mg tablets #20 to be taken, tapered off over 8 days.   Chart review indicates the patient is due for a comprehensive health care  evaluation, which I have scheduled for the next 4-6 weeks, but we  will  certainly see her sooner if she does not respond to the above measures  promptly.                                   Charlaine Dalton. Sherene Sires, MD, [name]   MBW/MedQ  DD:  05/21/2006  DT:  05/22/2006  Job #:  [id number]

## 2011-02-10 NOTE — H&P (Signed)
NAMESHERREY, NORTH            ACCOUNT NO.:  [id number]   MEDICAL RECORD NO.:  [id number]          PATIENT TYPE:  INP   LOCATION:  NA                           FACILITY:  [facility]   PHYSICIAN:  Madlyn Frankel. Charlann Boxer, M.D.  DATE OF BIRTH:  11-06-1950   DATE OF ADMISSION:  01/01/2007  DATE OF DISCHARGE:                              HISTORY & PHYSICAL   PROCEDURE:  Left total knee arthroplasty.   CHIEF COMPLAINT:  Left knee pain.   HISTORY OF PRESENT ILLNESS:  This is a 61 year old female with a history  of persistent and progressive left knee pain, with history of right  total knee arthroplasty in  January of 2008 by surgeon Dr. Durene Romans.  Pain in her left knee has been unresolved and refractory to all  conservative treatments, with included oral anti-inflammatories,  cortisone injections.  She has been cleared pre-surgically, previously  by her primary care physician, Dr. Weyman Pedro.   PAST MEDICAL HISTORY:  Significant of anxiety, migraines, asthma, COPD,  hiatal hernia, reflux disease, osteoarthritis, pelvic fracture.   PAST SURGICAL HISTORY:  1. Left rotator cuff repair 2003.  2. Left hammertoe repair 2004.  3. Left knee arthroscopic surgery 2007.  4. Right total knee replacement January 2008.   FAMILY MEDICAL HISTORY:  Heart disease, cancer, arthritis.   SOCIAL HISTORY:  She is widowed.  Her primary caregiver after surgery  will be her sister, Guss Bunde.   DRUG ALLERGIES:  Include:  1. Theo-Dur.  2. Hydrocodone.  3. Darvon.  4. Epinephrine.   MEDICATIONS:  1. Nexium 40 mg one p.o. daily.  2. Celebrex 200 mg one p.o. daily.  3. AeroBid inhaler 2 puffs q.a.m. and q.p.m.  4. Lexapro 10 mg one p.o. daily.  5. Alprazolam 0.5 mg one p.o. p.r.n.  6. Furosemide 20 mg one p.o. every other day every other day.  7. Albuterol rescue inhaler p.r.n.  8. Mucinex D p.r.n.  9. Keflex 500 mg one p.o. t.i.d.  10.Prednisone 20 mg three daily for 3 days, then two daily for 2  days,      then one for 11 days for bronchitis,. per her primary care      physician addressed at time of a history and physical, and      discussed about cessation prior to surgery.   REVIEW OF SYSTEMS:  RESPIRATORY/PULMONARY:  She has a recent case of  bronchitis, was seen by her primary care physician and given a  prescription for prednisone.  GI:  She had a recent stomach virus where  she had nausea, vomiting and diarrhea, but it is resolved.  Otherwise,  see HPI.   PHYSICAL EXAMINATION:  VITAL SIGNS:  Pulse 96, respirations 18, blood  pressure 166/102.  GENERAL:  A little bit fatigued, lethargic with cough, otherwise awake,  alert and oriented, well-developed, well-nourished, in no acute  distress.  NECK:  Supple.  No carotid bruits.  CHEST:  She has rales in both lungs.  BREASTS:  Deferred.  HEART:  Regular rate and rhythm without gallops, clicks or murmurs.  ABDOMEN:  Soft, nontender, nondistended.  Bowel sounds present.  GENITOURINARY:  Deferred.  EXTREMITIES:  Left knee has full extension and flexion  to 120 with some  diffuse tenderness.  SKIN:  She has dorsalis pedis pulse, positive left lower extremity.  No  signs of cellulitis.  NEUROLOGIC:  She has intact distal sensibilities.   LABS:  EKG, chest x-ray are pending at [facility] pre-surgical testing.   IMPRESSION:  Left knee osteoarthritis.   PLAN OF ACTION:  Left total knee arthroplasty on January 01, 1999 at [facility] by surgeon Dr. Durene Romans.  Risks and complications were  discussed.  Questions were encouraged, answered and reviewed.     ______________________________  Yetta Glassman [address], [place]      Madlyn Frankel. Charlann Boxer, M.D.  Electronically Signed    BLM/MEDQ  D:  12/28/2006  T:  12/28/2006  Job:  [id number]   cc:   Weyman Pedro  [address]  [place] [address]

## 2011-02-10 NOTE — H&P (Signed)
[name], [name]            ACCOUNT NO.:  [id number]   MEDICAL RECORD NO.:  [id number]          PATIENT TYPE:  INP   LOCATION:  NA                           FACILITY:  [facility]   PHYSICIAN:  Madlyn Frankel. Charlann Boxer, M.D.  DATE OF BIRTH:  [birth year]   DATE OF ADMISSION:  10/23/2006  DATE OF DISCHARGE:                              HISTORY & PHYSICAL   PROCEDURE:  Right total knee replacement.   SURGEON:  Dr. Durene Romans.   CHIEF COMPLAINT:  Right knee pain.   HISTORY OF PRESENT ILLNESS:  [name] is a very pleasant female  complaining of bilateral knee pain which is secondary to osteoarthritis  is treated in previous visits.  She has been refractory to all  conservative treatment measures.  Her quality of life has been  diminished as results of the significant pain.  As a result, she has  been scheduled for a right total knee replacement.  She also states that  in the recent past, approximately on December 15, she fell when tripping  over some shoes and sustained an inferior pubic rami fracture.  She had  been seen by Dr. Ranell Patrick for this.  Stated this should not interfere or  keep [place] from the right total knee replacement surgery.   PAST MEDICAL HISTORY:  1. Migraines.  2. Anxiety/depression.  3. Asthma.  4. Bronchitis.  5. COPD.  6. Reflux disease.  7. Urge incontinence.  8. Osteoarthritis.   PAST SURGICAL HISTORY:  1. Colposcopy and D&C in 1980.  2. Left rotator cuff repair, 2004.  3. Left knee arthroscopy, 2007.   FAMILY HISTORY:  Cancer, arthritis.   SOCIAL HISTORY:  She is widowed and will be staying with her sister, who  will be the primary caregiver after surgery.   DRUG ALLERGIES:  THEO-DUR, DARVON.   MEDICATIONS:  1. Celebrex 200 mg 1 p.o. daily.  2. Xanax 0.5, 1 p.o. p.r.n., no more than 2 per day.  3. Nexium 40 mg 1 p.o. before meals.  4. __________  20 mg 1 p.r.n. may take 1 daily.  5. Macrobid inhaler 7 g 2 puffs q.a.m., q.p.m.  6. Albuterol 17 g  container 2 puffs p.r.n. no more than x4 daily.   REVIEW OF SYSTEMS:  No new signs or symptoms of any cardiovascular,  respiratory, gastrointestinal, genitourinary, neurological system  complaints.  Musculoskeletal:  Recent inferior previous rami fractures  noted in history of present illness, otherwise all negative.   PHYSICAL EXAMINATION:  VITAL SIGNS:  Temperature 90.1, pulse 88,  respirations 18, blood pressure 180/96.  GENERAL:  Awake, alert, and oriented, well-developed, well-nourished  female, appears to be in mild distress due to the rami fracture.  NECK:  Supple.  No carotid bruits.  CHEST/LUNGS:  Clear to auscultation bilaterally.  BREASTS:  Deferred.  HEART: Regular rate and rhythm without gallops, clicks, rubs or murmurs.  ABDOMEN:  Soft, nontender, nondistended.  Bowel sounds present.  GENITOURINARY:  Deferred.  EXTREMITIES:  Painful range of motion.  SKIN:  Does show edema right lower extremity.  She has multiple healing  scars.  The dorsalis.  pedis pulse  nonpalpable.  Capillary refill less  than 2 seconds.  NEUROLOGIC:  Intact sensibilities, distal.   Labs pending.   X-rays of the right knee AP and lateral reviewed.   IMPRESSION:  Right knee osteoarthritis.   PLAN OF ACTION:  Right total knee replacement, [facility],  October 23, 2006, by surgeon Dr. Durene Romans.  All risks and  complications were discussed.  The questions were encouraged answered  and reviewed.     ______________________________  [name], [place]      Madlyn Frankel. Charlann Boxer, M.D.  Electronically Signed    BLM/MEDQ  D:  10/15/2006  T:  10/15/2006  Job:  [id number]

## 2011-02-10 NOTE — Assessment & Plan Note (Signed)
[facility]                             PULMONARY OFFICE NOTE   Shelley, [name]                 MRN:          [id number]  DATE:09/24/2006                            DOB:          12-10-1950    HISTORY:  This is a 60 year old white female who has not been back for a  comprehensive health care evaluation since 2003, but comes in today for  surgical clearance to have her knees repaired.  She has had trouble  with morbid obesity with a peak weight of around 300 pounds, and has  been able to lose down to around 200 now, but debilitated related to  knee pain, right greater than left, and plans to have knee replacement  surgery.  She is not limited by dyspnea at present, and on the average,  uses albuterol less than twice weekly, waking up with asthma less than  twice a month while maintained on AeroBid 2 puffs b.i.d.  She denies any  exertional chest pain, pleuritic pain, orthopnea, PND or leg swelling.   PAST MEDICAL HISTORY:  1. Morbid obesity with ideal weight 156.  2. Mild asthmatic bronchitis status post smoking cessation in 1986.  3. Diverticulosis status post most recent colonoscopy November 2001.      In the computer for recall July 06, 2005, but has not scheduled      it.  4. Degenerative arthritis, status post left knee arthroscopy January      2001.  5. History of cervical dysplasia followed at [facility] previously.  6. Blood clot while living in [place], presumably related to obesity.   ALLERGIES:  1. THEODORE.  2. EPINEPHRINE.  3. DARVON.  All with nonspecific reactions.   MEDICATIONS:  Taken in detail on the worksheet.  Corrected and  accommodated September 24, 2006.   SOCIAL HISTORY:  She quit smoking about 18 years ago.  She works as a  Solicitor.  She does not get any regular exercise.   FAMILY HISTORY:  Positive for breast cancer in her mother.  One sister  had breast cancer.  Her dad had a stroke in his 78s.  There is no  premature heart disease in her family to her knowledge, nor any history  of any colon cancer.  Her father did have emphysema and was smoker.   REVIEW OF SYSTEMS:  Taken in detail, and essentially negative except as  outlined above.   She is here today for surgical clearance, but also complaining of  increasing dry cough over the last several days, more of a daytime than  a nighttime cough with no overt sinus or reflux symptoms, itching,  sneezing or wheezing.   PHYSICAL EXAMINATION:  This is a somber, but not overtly depressed,  ambulatory, obese, white female, in no acute distress.  She had stable  vital signs except for a blood pressure of 152/80.  HEENT:  Unremarkable.  Oropharynx clear.  Dentition is intact.  The ear  canals are clear bilaterally.  Nasal turbinates are normal.  Limited  funduscopy was normal.  LUNG FIELDS:  Review a few rhonchi bilaterally.  Overall air movement is  adequate.  HEART:  Regular rhythm without murmur, gallop or rub.  ABDOMEN:  Soft, benign with no palpable organomegaly, masses or  tenderness.  EXTREMITIES:  Warm without calf tenderness, cyanosis, clubbing or edema.  MUSCULOSKELETAL EXAM:  She did have mild crepitus in both knees, right  greater than left, otherwise musculoskeletal exam was unremarkable.  LABS:  Chest x-ray was unremarkable.  Labs were requested, but are not  in the computer at the time of dictation.   IMPRESSION:  1. Acute cough over the last several days, may be due to a upper      respiratory infection, but could also be due to poorly controlled      reflux, and/or based on a cyclical problem.  The fact that the      cough does not occur at night is reassuring that it is not an      asthma flare.  I, therefore, recommended that she take Codiclear 2      teaspoons every 4 hours to control cough and increase PPI therapy      up to b.i.d. until 100% back to baseline, then 1 daily thereafter.  2. Morbid obesity complicated now by  degenerative arthritis, which is      going to require apparently knee replacement.  I am concerned about      this patient's risk deep vein thrombosis and also pulmonary issues      related to immobility such as atelectasis and respiratory failure,      but believe she would be a reasonable surgical risk.  I do believe      she needs to re-devote herself to longterm weight loss through      calorie balance issues that we discussed in detail today.  3. General health maintenance.  She was updated in tetanus in 1994,      Pneumovax in 2000, but is overdue for followup colonoscopy as of      October 2006, which will need to wait now until her knees are taken      care of.  4. Followup.  I have scheduled her for an appointment here in 3 months      with pulmonary function tests.  We will see her back sooner if      needed.  We will tentatively clear her for knee surgery.     Charlaine Dalton. Sherene Sires, MD, [facility]  Electronically Signed    MBW/MedQ  DD: 09/26/2006  DT: 09/26/2006  Job #: [id number]   cc:   Almedia Balls. Ranell Patrick, M.D.

## 2011-02-10 NOTE — Op Note (Signed)
NAMEKATIEJO, GILROY NO.:  [id number]   MEDICAL RECORD NO.:  [id number]          PATIENT TYPE:  INP   LOCATION:  0006                         FACILITY:  [facility]   PHYSICIAN:  Madlyn Frankel. Charlann Boxer, M.D.  DATE OF BIRTH:  11-29-1950   DATE OF PROCEDURE:  01/28/2007  DATE OF DISCHARGE:                               OPERATIVE REPORT   PREOPERATIVE DIAGNOSIS:  Left knee osteoarthritis, history of right  total knee replacement.   POSTOPERATIVE DIAGNOSIS:  Left knee osteoarthritis, history of right  total knee replacement.   PROCEDURE:  Left total knee replacement.   COMPONENTS USED:  DePuy rotating platform posterior stabilized knee  system, size 2.5 femur, 3 tibia, 2.5 x 15 mm insert and a 38 patellar  button.   SURGEON:  Madlyn Frankel. Charlann Boxer, M.D.   ASSISTANT:  Dwyane Luo, PA-C   ANESTHESIA:  Duramorph spinal.   TOURNIQUET TIME:  42 minutes 250 mmHg.   COMPLICATIONS:  None.   DRAIN:  None.   INDICATIONS FOR PROCEDURE:  Ms. Letitia Libra is a 60 year old female who  presented to the office with bilateral knee osteoarthritis.  She has had  a history of right total knee replacement 3 months ago which she had  done very well with.  She is now for scheduled left total knee  replacement.  She reviewed the risks and benefits and with her history  of this right knee though she had done very well with she is anxious  about her recovery and going through the pain again.   Consent was obtained.   PROCEDURE IN DETAIL:  The patient was brought to operative theater.  Once adequate anesthesia and preoperative antibiotics administered, the  patient was positioned supine with the left tourniquet placed.  The left  lower extremity pre scrubbed and prepped and draped in sterile fashion.  Midline incision was made followed by modified medial parapatellar  arthrotomy patella.  Patella subluxation was carried out.  The patient  noted to have a very large effusion and a very large  synovitis.  An  extensive synovectomy was carried out at this point due to this large  synovitis including major debulking to the suprapatellar pouch region.   Following this exposure and partial meniscectomies and debridement  anterior fat pad region, Attention was first directed to the patella.  Precut measure was 22 mm.  I resected down to 14 mm and used a 38  patellar button which was similar to the contralateral side.   I then placed a metal shim to protect the patella from retractors during  the case.  At this point further knee exposure obtained.  The femoral  canal was opened with drill, then irrigated to prevent fat emboli.  The  intramedullary rod was passed and at 5 degrees of valgus for this left  knee, 10 mm of bone resected off the distal femur.  I then sized the  femur and it turned out to be a size 2.5 same as the contralateral knee.  The anterior-posterior and chamfer cuts were on all made based off the  posterior condylar axis which was perpendicular to [employer]  line.   The bone was removed including osteophytes medial laterally.   At this point the final trochlear box cut was made based off the lateral  aspect of distal femur.  This point attention was now directed to the  tibia.  Tibial exposure obtained including meniscectomies medially and  laterally.  On her contralateral leg, I resected 8 mm of bone off the  lateral side and so I chose to do this again at this point, despite  significant wear pattern.   Radiographically her tibia appeared to be relatively symmetric.  When we  resected 8 mm of bone this time the cut surface still a size 3.  I  checked the extensor block and found it was going to be at least 12.5  and probably even 15.  I went ahead and placed a size 3, tibial tray,  pinned it, checked alignment rod and was satisfied the cut was  perpendicular in both planes.  I went ahead and drilled and keel punched  this area. trial reduction was then  carried out.  I went straight to the  15 poly as my initial thought and the knee came out perfectly to  extension without any hyperextension, was stable from extension to  flexion of the patella tracked without application of pressure.  Given  this all trial components removed.  Further debridements were carried  out as necessary.  No debridement was necessary posteriorly.  The knee  was then irrigated with normal saline solution while final components  were opened.  The knee was injected with 60 mL of 0.25% Marcaine with  epinephrine and 1 mL of Toradol.   Once cement was ready, components were cemented in position first with  the tibia, then femur and then patella.  Extruded cement was removed.  Once the cement had cured, excessive cement was debrided throughout the  knee and again at the posterior aspect of the knee.  Once I was  satisfied that I was unable to visualize any free cement fragments, I  placed a final 15 insert.  This matched the 2.5 femur.   The knee was again irrigated.  Tourniquet was let down at 42 minutes.  I  will then injected 5 mL of FloSeal into the medial lateral gutters.  The  knee was then reapproximated with the knee in flexion and with #1  Vicryl.  The remaining wound was closed with 2-0 Vicryl and then running  4-0 Monocryl.  Her skin was noted be thin due to history of prednisone  use but we were able to get it closed like the contralateral knee.  The  knee was then cleaned, dried and dressed sterilely with Steri-Strips,  bulky sterile dressing.  She was transferred to recovery room in stable  condition.      Madlyn Frankel Charlann Boxer, M.D.  Electronically Signed     MDO/MEDQ  D:  01/28/2007  T:  01/28/2007  Job:  [id number]

## 2011-02-10 NOTE — H&P (Signed)
NAMECHERYL, CHAY NO.:  [id number]   MEDICAL RECORD NO.:  [id number]         PATIENT TYPE:  LINP   LOCATION:                               FACILITY:  [facility]   PHYSICIAN:  Madlyn Frankel. Charlann Boxer, M.D.  DATE OF BIRTH:  May 05, 1951   DATE OF ADMISSION:  01/28/2007  DATE OF DISCHARGE:                              HISTORY & PHYSICAL   [name] is being admitted to [facility] on Jan 28, 2007.  Surgeon will be Dr. Durene Romans.  Procedure will be a left total  knee arthroplasty.   CHIEF COMPLAINT:  Left knee pain.   HISTORY OF PRESENT ILLNESS:  This is a 60 year old female with a history  persistent progressive left knee pain with a history of right total knee  replacement in the past.  She has been scheduled for surgery twice, has  become medically ill and unable to proceed forward with the surgery in  the past.  In regards to her left knee, it has been refractory to all  conservative treatments.  She has had a recent bout of falling with  significant amount of effusion which was aspirated and injected,  resolving some of the pain associated with the fall.  She has been  cleared presurgically by Dr. Weyman Pedro.   PAST MEDICAL HISTORY:  1. Osteoarthritis.  2. Degenerative disk disease in her back.  3. Pelvic fracture.  4. COPD.  5. Reflux disease.  6. Asthma.  7. Hiatal hernia.  8. Migraines.  9. Anxiety.   PAST SURGICAL HISTORY:  1. Right total knee replacement in January 2008.  2. Left knee arthroscopic surgery in 2007.  3. Left rotator cuff repair in 2003.  4. Left hammertoe repair 2004.   FAMILY MEDICAL HISTORY:  Arthritis, heart disease, cancer.   SOCIAL HISTORY:  She is widowed.  Her primary caregiver after surgery  will be her sister, [name].   DRUG ALLERGIES:  Theo-Dur, hydrocodone, Darvon, epinephrine.   MEDICATIONS:  1. Nexium 40 mg one p.o. daily.  2. Celebrex 200 mg one p.o. daily.  3. AeroBid inhaler 2 puffs q.a.m.  and q.p.m.  4. Lexapro 10 mg one p.o. daily.  5. Alprazolam 0.5 mg one p.o. p.r.n.  6. Furosemide 20 mg one p.o. every other day.  7. Albuterol rescue inhaler p.r.n.Marland Kitchen   REVIEW OF SYSTEMS:  RESPIRATORY:  Resolved bronchitis.  MUSCULOSKELETAL:  Fell on left knee, had increased pain and swelling,  treated in the orthopedic office with aspiration and injection.   Otherwise none.  See HPI.   PHYSICAL EXAMINATION:  Pulse 96, respirations 18, blood pressure 180/94.  GENERAL:  She is awake, alert and oriented, well developed, well  nourished, no acute distress.  NECK:  Supple.  No carotid bruits.  CHEST:  Lungs clear to auscultation bilaterally.  BREASTS:  Deferred.  HEART:  Regular rate and rhythm without gallops, clicks, rubs or  murmurs.  ABDOMEN:  Soft, nontender, nondistended.  Bowel sounds present in all  four quadrants.  GENITOURINARY:  Deferred.  EXTREMITIES:  Left knee has full extension and flexion to 120.  Right  knee with well-healed scar, full range of motion 0 to 120.  Left lower  extremity has an intact dorsalis pedis pulse.  NEUROLOGIC:  Intact distal sensibilities.   Labs, EKG, chest x-ray are all pending.   IMPRESSION:  Left knee osteoarthritis.   PLAN OF ACTION:  Left total knee arthroplasty on Jan 28, 2007 at [facility] by surgeon Dr. Durene Romans.  Risks and complications were  discussed.  Questions were encouraged, answered and reviewed.     ______________________________  Yetta Glassman [address], [place]      Madlyn Frankel. Charlann Boxer, M.D.  Electronically Signed    BLM/MEDQ  D:  01/16/2007  T:  01/16/2007  Job:  [id number]

## 2011-06-02 ENCOUNTER — Telehealth: Payer: Self-pay | Admitting: Internal Medicine

## 2011-06-02 NOTE — Telephone Encounter (Signed)
I spoke with pt and she was requesting a sample of symbicort 160 until she can get her rx sent to her from [facility]. I advised pt would leave 1 sample upfront for her to pick up. Pt verbalized understanding adn had no questions

## 2011-07-07 ENCOUNTER — Other Ambulatory Visit: Payer: Self-pay | Admitting: Internal Medicine

## 2011-10-11 ENCOUNTER — Ambulatory Visit (INDEPENDENT_AMBULATORY_CARE_PROVIDER_SITE_OTHER): Payer: 59 | Admitting: Pulmonary Disease

## 2011-10-11 ENCOUNTER — Ambulatory Visit: Payer: 59 | Admitting: Pulmonary Disease

## 2011-10-11 ENCOUNTER — Encounter: Payer: Self-pay | Admitting: Pulmonary Disease

## 2011-10-11 ENCOUNTER — Telehealth: Payer: Self-pay | Admitting: Internal Medicine

## 2011-10-11 DIAGNOSIS — J45901 Unspecified asthma with (acute) exacerbation: Secondary | ICD-10-CM

## 2011-10-11 DIAGNOSIS — J209 Acute bronchitis, unspecified: Secondary | ICD-10-CM | POA: Insufficient documentation

## 2011-10-11 MED ORDER — CEFDINIR 300 MG PO CAPS: 600.0000 mg | ORAL_CAPSULE | Freq: Every day | ORAL | Status: AC

## 2011-10-11 MED ORDER — HYDROCODONE-HOMATROPINE 5-1.5 MG/5ML PO SYRP: 5.0000 mL | ORAL_SOLUTION | Freq: Four times a day (QID) | ORAL | Status: AC | PRN

## 2011-10-11 MED ORDER — PREDNISONE 10 MG PO TABS: ORAL_TABLET | ORAL | Status: DC

## 2011-10-11 NOTE — Telephone Encounter (Signed)
Spoke with pt and she states has has "croupy, dry cough" x several days and also slight increased SOB. She was scheduled to see [facility] this am but coming to her appt her tire went flat and she can not make it until later- appt rescheduled for 4 pm with KC.

## 2011-10-11 NOTE — Assessment & Plan Note (Signed)
The patient is coughing up purulent mucus for the last one week, and this is not improving.  More than likely this is a bacterial process at this point, and we'll therefore treat her with a course of antibiotics.

## 2011-10-11 NOTE — Progress Notes (Signed)
  Subjective:    Patient ID: [name], female    DOB: May 06, 1951, 61 y.o.   MRN: [id number]  HPI The patient comes in today for an acute sick visit.  She has known asthma, and is maintained on a good bronchodilator regimen.  She gives a one-week history of worsening chest congestion, cough with purulent mucus, and worsening shortness of breath.  She initially tried to "ride this out", but her symptoms have just worsened.  She has had some low-grade fever.  Her cough has escalated to the point that she is coughing incessantly and producing mucus.   Review of Systems  Constitutional: Positive for diaphoresis. Negative for fever and unexpected weight change.  HENT: Positive for ear pain, congestion, rhinorrhea and postnasal drip. Negative for nosebleeds, sore throat, sneezing, trouble swallowing, dental problem and sinus pressure.   Eyes: Negative for redness and itching.  Respiratory: Positive for cough, chest tightness, shortness of breath and wheezing.   Cardiovascular: Positive for chest pain. Negative for palpitations and leg swelling.  Gastrointestinal: Negative for nausea and vomiting.  Genitourinary: Negative for dysuria.  Musculoskeletal: Negative for joint swelling.  Skin: Negative for rash.  Neurological: Positive for headaches.  Hematological: Does not bruise/bleed easily.  Psychiatric/Behavioral: Negative for dysphoric mood. The patient is not nervous/anxious.        Objective:   Physical Exam Obese female in no acute distress.  Significant coughing during our visit Nose without purulence or discharge noted Oropharynx clear, no exudates Chest with diffuse rhonchi and some true wheezing, as well as upper airway pseudo-wheezing Cardiac exam with regular rate and rhythm Lower extremities with mild edema, no cyanosis noted Alert and oriented, moves all 4 extremities.       Assessment & Plan:

## 2011-10-11 NOTE — Assessment & Plan Note (Signed)
The patient clearly has bronchospasm on exam related to her acute bronchitis, and is uncomfortable with respect to her breathing.  She will need a short course of prednisone to get her through this.

## 2011-10-11 NOTE — Patient Instructions (Signed)
Will treat with a short course of prednisone for your asthma flareup Will treat with omnicef for your bronchitis Hycodan as directed for your severe coughing. Please call if you do not improve, and keep followup with Dr. Sherene Sires.

## 2011-10-12 ENCOUNTER — Ambulatory Visit: Payer: 59 | Admitting: Adult Health

## 2011-11-06 DIAGNOSIS — I4892 Unspecified atrial flutter: Secondary | ICD-10-CM

## 2011-11-10 ENCOUNTER — Ambulatory Visit (INDEPENDENT_AMBULATORY_CARE_PROVIDER_SITE_OTHER): Payer: 59 | Admitting: *Deleted

## 2011-11-10 ENCOUNTER — Encounter: Payer: Self-pay | Admitting: Cardiology

## 2011-11-10 DIAGNOSIS — I4891 Unspecified atrial fibrillation: Secondary | ICD-10-CM

## 2011-11-10 DIAGNOSIS — Z7901 Long term (current) use of anticoagulants: Secondary | ICD-10-CM

## 2011-11-10 LAB — POCT INR

## 2011-11-21 ENCOUNTER — Ambulatory Visit (INDEPENDENT_AMBULATORY_CARE_PROVIDER_SITE_OTHER): Payer: 59 | Admitting: *Deleted

## 2011-11-21 DIAGNOSIS — Z7901 Long term (current) use of anticoagulants: Secondary | ICD-10-CM

## 2011-11-21 DIAGNOSIS — I4891 Unspecified atrial fibrillation: Secondary | ICD-10-CM

## 2011-11-21 NOTE — Patient Instructions (Signed)
Has appt with DeGent tomorrow.  Will send message to him re. Further need for lovenox since pt is out so long.  Pt is leaving for [place] on 3/1 returning 3/5 so she can not have coumadin checked until 12/01/11.

## 2011-11-22 ENCOUNTER — Ambulatory Visit (INDEPENDENT_AMBULATORY_CARE_PROVIDER_SITE_OTHER): Payer: 59 | Admitting: Cardiology

## 2011-11-22 ENCOUNTER — Encounter: Payer: Self-pay | Admitting: Cardiology

## 2011-11-22 VITALS — BP 166/83 | HR 96 | Resp 18 | Ht 65.0 in | Wt 258.8 lb

## 2011-11-22 DIAGNOSIS — IMO0002 Reserved for concepts with insufficient information to code with codable children: Secondary | ICD-10-CM | POA: Insufficient documentation

## 2011-11-22 DIAGNOSIS — I4892 Unspecified atrial flutter: Secondary | ICD-10-CM | POA: Insufficient documentation

## 2011-11-22 DIAGNOSIS — J449 Chronic obstructive pulmonary disease, unspecified: Secondary | ICD-10-CM

## 2011-11-22 DIAGNOSIS — I4891 Unspecified atrial fibrillation: Secondary | ICD-10-CM

## 2011-11-22 DIAGNOSIS — R0600 Dyspnea, unspecified: Secondary | ICD-10-CM

## 2011-11-22 MED ORDER — ENOXAPARIN SODIUM 120 MG/0.8ML ~~LOC~~ SOLN: 120.0000 mg | Freq: Two times a day (BID) | SUBCUTANEOUS | Status: DC

## 2011-11-22 NOTE — Patient Instructions (Signed)
Follow up as scheduled with Misty Stanley and Dr. Earnestine Leys. Continue Lovenox and Coumadin. Call the office with your blood pressure medications.

## 2011-11-22 NOTE — Progress Notes (Signed)
I figured  as much when I saw her in hospital. Thanks for head up. Given her noncompliance I would not go back to Lovenox and I will have discussion about Xarelto if she is not going to be compliant with monitoring.  Thanks for the heads up  Michelle Piper ===View-only below this line===  ----- Message -----    From: Louanna Raw, RN    Sent: 11/21/2011   5:20 PM      To: Peyton Bottoms, MD  You are seeing her tomorrow for phosp visit. She is a mess and doubt her compliance You d/c her on lovenox 120mg  bid and coumadin 5mg  daily on 11/07/11.  At her first INR check she had not started coumadin. Did coumadin teaching that day and sent in another Rx for lovenox.  Continued lovenox and told her to start coumadin.  She cancelled INR appt for last Friday and came in today.  INR 1.2  Denies missing doses.  Says she finished Lovenox yesterday but if she had been taking correctly she should have ran out 4 days ago.  I gave her a coumadin boost today and tomorrow and increased her dose to 7.5mg  daily.  She is going to [place]. Friday and will not be back till Tuesday.  Will recheck INR 3/8.  Didn't know if you wanted her back on Lovenox since she is 2 weeks out.  Please address with her tomorrow. Good luck with this one!

## 2011-11-22 NOTE — Progress Notes (Signed)
Shelley Bottoms, MD, [facility] Board Certified in Adult Cardiovascular Medicine,Internal Medicine and Critical Care Medicine    CC: Followup patient post urgent cardioversion.  HPI:  The patient is a obese 61 year old female who was recently admitted to [facility] with dyspnea and type I atrial flutter with 2-1 AV conduction. The patient underwent a TEE guided cardioversion. Normal sinus rhythm was restored. The patient was discharged the next day on Lovenox 120 mg subcutaneous twice a day as well as warfarin. However per the patient's report she was given different instructions from the nurse to discharge her and told her to finish her Lovenox and then start warfarin. When the patient came to see our Coumadin clinic last week she was not on Coumadin and run out of her Lovenox. She was restarted on Coumadin. The patient is still in for weekly no of risk for stroke and we had a long discussion about this. The patient will be restarted on Lovenox immediately at 120 mg subcutaneous twice a day. The patient remains in normal sinus rhythm. We did place her short term on amiodarone because when the patient was in the emergency room we were unable to slow her down both with beta blocker therapy and Cardizem. Therefore I performed an urgent cardioversion with IV propofol in the emergency room under TEE guidance. The patient states she has no chest pain she has chronic dyspnea but also has asthma. TEE was noted for normal LV function although this was somewhat difficult established secondary to her rapid heart rate of 150 beats per minute. There was mild left atrial enlargement.  The patient also has a high STOP BANG score and has been set up for an appointment Dr. Andrey Campanile to screen for obstructive sleep apnea. The patient has recently been treated with prednisone for acute bronchitis. She's not taking metoprolol and tells me that she is also normal her blood pressure medication but that she did not bring  it with her and she is not sure and will call us later with the medication. Her blood pressure however is poorly controlled and after finding out what her blood pressure medication is we'll either increase or add additional therapy for blood pressure control. Fortunately the patient reports no symptoms consistent with TIA/RIND. She denies any palpitations orthopnea or PND presyncope or syncope.   PMH: reviewed and listed in Problem List in Electronic Records (and see below) Past Medical History  Diagnosis Date  . Asthma     spirometry 1989: FEV1 2.12 (71%) with ratio 82.  HFA 90% after coaching 11-09-10  . COPD (chronic obstructive pulmonary disease)   . GERD (gastroesophageal reflux disease)     pso EGD with HH / esophagitis 06-30-93  . Osteoarthritis   . DDD (degenerative disc disease)   . Pelvic fracture   . Hiatal hernia   . Migraine   . Anxiety disorder   . Anemia   . Diverticulosis   . Adenomatous colon polyp   . Atrial flutter     Status post cardioversion 2-12 2013. Discharged on Lovenox but patient did not take Coumadin. Misunderstood instructions. Short-term amiodarone  . Dyspnea     multifactorial, obesity, deconditioning, normal LV function by echocardiogram performed in primary care's office, rule out obstructive sleep apnea  . Hypertension    Past Surgical History  Procedure Date  . Total knee arthroplasty     x2 09/2006  . Rotator cuff repair     right    Allergies/SH/FHX : available in  Electronic Records for review  Allergies  Allergen Reactions  . Epinephrine   . Propoxyphene Hcl    History   Social History  . Marital Status: Widowed    Spouse Name: N/A    Number of Children: 0  . Years of Education: N/A   Occupational History  . Runner, broadcasting/film/video at [employer]    Social History Main Topics  . Smoking status: Former Smoker -- 1.0 packs/day for 20 years    Types: Cigarettes    Quit date: 09/25/1984  . Smokeless tobacco: Never Used   . Alcohol Use: No  . Drug Use: No  . Sexually Active: Not on file   Other Topics Concern  . Not on file   Social History Narrative   Daily caffeine use 1 cup/day   Family History  Problem Relation Age of Onset  . Emphysema Father     smoker  . Heart disease Father   . Breast cancer Mother   . Breast cancer Sister   . Heart disease Sister   . Colon cancer Neg Hx     Medications: Current Outpatient Prescriptions  Medication Sig Dispense Refill  . albuterol (VENTOLIN HFA) 108 (90 BASE) MCG/ACT inhaler Inhale 2 puffs into the lungs every 6 (six) hours as needed.        . ALPRAZolam (XANAX) 0.5 MG tablet Take 0.5 mg by mouth daily as needed.        Marland Kitchen amiodarone (PACERONE) 200 MG tablet Take 200 mg by mouth daily.      . budesonide-formoterol (SYMBICORT) 160-4.5 MCG/ACT inhaler Inhale 2 puffs into the lungs 2 (two) times daily.        . celecoxib (CELEBREX) 200 MG capsule Take by mouth every other day.        . furosemide (LASIX) 20 MG tablet 1/2 tab daily as needed       . omeprazole (PRILOSEC) 40 MG capsule TAKE ONE CAPSULE BY MOUTH ONCE DAILY BEFORE BREAKFAST  30 capsule  11  . Ascorbic Acid (VITAMIN C) 500 MG tablet Take 2,000 mg by mouth daily.       Marland Kitchen enoxaparin (LOVENOX) 120 MG/0.8ML SOLN Inject 0.8 mLs (120 mg total) into the skin every 12 (twelve) hours.  28 Syringe  1  . predniSONE (DELTASONE) 10 MG tablet Take 4 tabs daily x 2 days, then 3 tabs daily x 2 days, then 2 tabs daily x 2 days, then stop.  18 tablet  0    ROS: No nausea or vomiting. No fever or chills.No melena or hematochezia.No bleeding.No claudication  Physical Exam: BP 166/83  Pulse 96  Resp 18  Ht 5\' 5"  (1.651 m)  Wt 258 lb 12.8 oz (117.391 kg)  BMI 43.07 kg/m2  SpO2 92% General: Obese white female in no distress. Neck: Short neck with large circumference. No carotid bruits. No thyromegaly nonnodular thyroid Lungs: Decreased breath sounds bilaterally without wheezing Cardiac: Regular rate and  rhythm with normal S1-S2. No murmur rubs or gallops Vascular: No edema. Normal distal pulses bilaterally Skin: Warm and dry Physcologic: Normal affect  12lead ECG: Normal sinus rhythm with no acute ischemic changes Limited bedside ECHO:N/A No images are attached to the encounter.    Patient Active Problem List  Diagnoses  . OBESITY, MORBID  . ASTHMA  . GERD  . RIB PAIN  . DYSPHAGIA  . TUBULOVILLOUS ADENOMA, COLON, HX OF  . COUGH  . Encounter for long-term (current) use of anticoagulants  . Dyspnea-chronic and  multifactorial normal LV function   . Atrial flutter-status post type I atrial flutter an urgent cardioversion/short-term amiodarone only   . DDD (degenerative disc disease)  . COPD (chronic obstructive pulmonary disease)    PLAN   Patient has remained in normal sinus rhythm after cardioversion amiodarone therapy.  The patient however was atypical flutter and I have recommended catheter frequency ablation particularly because she's not a good candidate for long-term amiodarone therapy given her underlying lung disease and relatively young age.  The patient is currently on Coumadin but there was a lapse of several days when she was not both on Coumadin and Lovenox. I explained to the patient is serious implications discussed that had in regards to the possibility of stroke. She states that she was misinformed by the nurse upon discharge. I have written for Lovenox to be started immediately today at 120 mg subcutaneous twice a day and followup in the Coumadin clinic next Tuesday. INR yesterday was 1.2.  The patient will also call with the additional blood pressure medication that she is on and we will make further adjustments if needed.  The patient has an appointment scheduled with Dr. Andrey Campanile for sleep study later this month as well as the EP service for possible RCFA.  Patient had normal thyroid function studies in hospital.

## 2011-11-28 ENCOUNTER — Ambulatory Visit (INDEPENDENT_AMBULATORY_CARE_PROVIDER_SITE_OTHER): Payer: 59 | Admitting: *Deleted

## 2011-11-28 DIAGNOSIS — Z7901 Long term (current) use of anticoagulants: Secondary | ICD-10-CM

## 2011-11-28 DIAGNOSIS — I4891 Unspecified atrial fibrillation: Secondary | ICD-10-CM

## 2011-11-28 MED ORDER — WARFARIN SODIUM 5 MG PO TABS: 5.0000 mg | ORAL_TABLET | Freq: Every day | ORAL | Status: DC

## 2011-12-01 ENCOUNTER — Ambulatory Visit (INDEPENDENT_AMBULATORY_CARE_PROVIDER_SITE_OTHER): Payer: 59 | Admitting: *Deleted

## 2011-12-01 DIAGNOSIS — I4891 Unspecified atrial fibrillation: Secondary | ICD-10-CM

## 2011-12-01 DIAGNOSIS — Z7901 Long term (current) use of anticoagulants: Secondary | ICD-10-CM

## 2011-12-01 LAB — POCT INR: INR: 2.9

## 2011-12-05 ENCOUNTER — Other Ambulatory Visit: Payer: Self-pay | Admitting: Cardiology

## 2011-12-08 ENCOUNTER — Ambulatory Visit (INDEPENDENT_AMBULATORY_CARE_PROVIDER_SITE_OTHER): Payer: 59 | Admitting: *Deleted

## 2011-12-08 DIAGNOSIS — I4891 Unspecified atrial fibrillation: Secondary | ICD-10-CM

## 2011-12-08 DIAGNOSIS — Z7901 Long term (current) use of anticoagulants: Secondary | ICD-10-CM

## 2011-12-08 LAB — POCT INR: INR: 4.9

## 2011-12-11 ENCOUNTER — Institutional Professional Consult (permissible substitution): Payer: 59 | Admitting: Internal Medicine

## 2011-12-15 ENCOUNTER — Ambulatory Visit (INDEPENDENT_AMBULATORY_CARE_PROVIDER_SITE_OTHER): Payer: 59 | Admitting: *Deleted

## 2011-12-15 DIAGNOSIS — I4891 Unspecified atrial fibrillation: Secondary | ICD-10-CM

## 2011-12-15 DIAGNOSIS — Z7901 Long term (current) use of anticoagulants: Secondary | ICD-10-CM

## 2011-12-15 LAB — POCT INR: INR: 1.5

## 2011-12-26 ENCOUNTER — Ambulatory Visit (INDEPENDENT_AMBULATORY_CARE_PROVIDER_SITE_OTHER): Payer: 59 | Admitting: *Deleted

## 2011-12-26 DIAGNOSIS — Z7901 Long term (current) use of anticoagulants: Secondary | ICD-10-CM

## 2011-12-26 DIAGNOSIS — I4891 Unspecified atrial fibrillation: Secondary | ICD-10-CM

## 2011-12-26 LAB — POCT INR: INR: 4

## 2012-01-05 ENCOUNTER — Ambulatory Visit (INDEPENDENT_AMBULATORY_CARE_PROVIDER_SITE_OTHER): Payer: 59 | Admitting: *Deleted

## 2012-01-05 DIAGNOSIS — Z7901 Long term (current) use of anticoagulants: Secondary | ICD-10-CM

## 2012-01-05 DIAGNOSIS — I4891 Unspecified atrial fibrillation: Secondary | ICD-10-CM

## 2012-01-05 LAB — POCT INR: INR: 3.5

## 2012-01-08 ENCOUNTER — Institutional Professional Consult (permissible substitution): Payer: 59 | Admitting: Internal Medicine

## 2012-01-11 ENCOUNTER — Other Ambulatory Visit: Payer: Self-pay | Admitting: Internal Medicine

## 2012-01-16 ENCOUNTER — Ambulatory Visit (INDEPENDENT_AMBULATORY_CARE_PROVIDER_SITE_OTHER): Payer: 59 | Admitting: Cardiology

## 2012-01-16 ENCOUNTER — Ambulatory Visit (INDEPENDENT_AMBULATORY_CARE_PROVIDER_SITE_OTHER): Payer: 59 | Admitting: *Deleted

## 2012-01-16 ENCOUNTER — Encounter: Payer: Self-pay | Admitting: Cardiology

## 2012-01-16 VITALS — BP 149/80 | HR 80 | Ht 65.0 in | Wt 273.0 lb

## 2012-01-16 DIAGNOSIS — I4892 Unspecified atrial flutter: Secondary | ICD-10-CM

## 2012-01-16 DIAGNOSIS — G4733 Obstructive sleep apnea (adult) (pediatric): Secondary | ICD-10-CM

## 2012-01-16 DIAGNOSIS — Z7901 Long term (current) use of anticoagulants: Secondary | ICD-10-CM

## 2012-01-16 DIAGNOSIS — J45909 Unspecified asthma, uncomplicated: Secondary | ICD-10-CM

## 2012-01-16 DIAGNOSIS — I4891 Unspecified atrial fibrillation: Secondary | ICD-10-CM

## 2012-01-16 LAB — POCT INR: INR: 2.2

## 2012-01-16 NOTE — Assessment & Plan Note (Signed)
Patient is trying to cut down on her caloric intake. I told her that weight reduction is an important part of her therapy.

## 2012-01-16 NOTE — Assessment & Plan Note (Signed)
Patient has not been for almost 2 months of anticoagulation her INR was stable today at 2.2.

## 2012-01-16 NOTE — Assessment & Plan Note (Signed)
The patient is status post TEE guided cardioversion for type I atrial flutter with 21 AV conduction. At that time the patient was hospitalized with heart failure symptoms. She was placed on short-term amiodarone with the anticipation to proceed with video catheter frequency ablation. We placed the patient on short-term amiodarone because when the patient was in the ER we were unable to slow her down with beta blocker therapy in addition with calcium channel blockers. Therefore I performed an urgent cardioversion with IV propofol in the emergency room under TEE guidance. All is also reluctant to use beta blockers because of the patient's history of asthma. TEE was noted for normal LV function despite rapid heart rate of 150 beats per minute period the patient was placed on Lovenox as well as Coumadin. Her INR has now been stable around 2.2. She has an appointment scheduled with Dr. Johney Frame in may for evaluation for radiate catheter frequency ablation as the patient given her age is not a good candidate for long-term amiodarone therapy.

## 2012-01-16 NOTE — Assessment & Plan Note (Signed)
Reportedly normal PFTs April 2012. Patient is followed by pulmonary.

## 2012-01-16 NOTE — Assessment & Plan Note (Signed)
Patient has an appointment scheduled with Dr. Andrey Campanile to proceed with sleep study. The patient is a very high pretest probability of obstructive sleep apnea and likely will need CPAP. She will see Dr. Andrey Campanile tomorrow.

## 2012-01-16 NOTE — Progress Notes (Signed)
Shelley Bottoms, MD, [facility] ABIM Board Certified in Adult Cardiovascular Medicine,Internal Medicine and Critical Care Medicine    CC: Followup patient with atrial flutter  HPI:  The patient is a 61 year old obese white female who presented 2 months ago to [facility] with atrial flutter type I with 2-1 AV conduction. The patient required emergent TEE guided cardioversion in the emergency room with IV propofol as you're unable to slow her down with both beta blockers and calcium channel blockers. She also presented with mild heart failure symptoms. She was placed on amiodarone only with the intention to keep her normal sinus rhythm until she could proceed with ablation with Dr. Johney Frame for typical flutter. In the interim the patient is also being evaluated for sleep apnea. She has been taking Coumadin for almost 2 months now and her INR is in therapeutic. From a cardiac standpoint she's been doing well. She reports no chest pain or recurrent palpitations. She has mild shortness of breath mainly related to her asthma and obesity. She reports no heart failure symptoms however. The patient is trying to cut down on her weight. She is trying to follow a diet. She's tried to cut her calories. Recently her lisinopril was increased to 30 mg by mouth daily for hypertension which is now well controlled. Thyroid function studies were within normal limits during hospitalization   PMH: reviewed and listed in Problem List in Electronic Records (and see below) Past Medical History  Diagnosis Date  . Asthma     spirometry 1989: FEV1 2.12 (71%) with ratio 82.  HFA 90% after coaching 11-09-10  . COPD (chronic obstructive pulmonary disease)   . GERD (gastroesophageal reflux disease)     pso EGD with HH / esophagitis 06-30-93  . Osteoarthritis   . DDD (degenerative disc disease)   . Pelvic fracture   . Hiatal hernia   . Migraine   . Anxiety disorder   . Anemia   . Diverticulosis   . Adenomatous colon polyp   .  Atrial flutter     Status post cardioversion 2-12 2013. Discharged on Lovenox but patient did not take Coumadin. Misunderstood instructions. Short-term amiodarone  . Dyspnea     multifactorial, obesity, deconditioning, normal LV function by echocardiogram performed in primary care's office, rule out obstructive sleep apnea  . Hypertension    Past Surgical History  Procedure Date  . Total knee arthroplasty     x2 09/2006  . Rotator cuff repair     right    Allergies/SH/FHX : available in Electronic Records for review  Allergies  Allergen Reactions  . Epinephrine   . Propoxyphene Hcl    History   Social History  . Marital Status: Widowed    Spouse Name: N/A    Number of Children: 0  . Years of Education: N/A   Occupational History  . Runner, broadcasting/film/video at [employer]    Social History Main Topics  . Smoking status: Former Smoker -- 1.0 packs/day for 20 years    Types: Cigarettes    Quit date: 09/25/1984  . Smokeless tobacco: Never Used  . Alcohol Use: No  . Drug Use: No  . Sexually Active: Not on file   Other Topics Concern  . Not on file   Social History Narrative   Daily caffeine use 1 cup/day   Family History  Problem Relation Age of Onset  . Emphysema Father     smoker  . Heart disease Father   . Breast  cancer Mother   . Breast cancer Sister   . Heart disease Sister   . Colon cancer Neg Hx     Medications: Current Outpatient Prescriptions  Medication Sig Dispense Refill  . albuterol (VENTOLIN HFA) 108 (90 BASE) MCG/ACT inhaler Inhale 2 puffs into the lungs every 6 (six) hours as needed.        . ALPRAZolam (XANAX) 0.5 MG tablet Take 0.5 mg by mouth daily as needed.        Marland Kitchen amiodarone (PACERONE) 400 MG tablet Take 0.5 tablets (200 mg total) by mouth daily.  30 tablet  6  . celecoxib (CELEBREX) 200 MG capsule Take by mouth every other day.        . fluticasone (FLONASE) 50 MCG/ACT nasal spray Place 2 sprays into the nose Daily.      .  furosemide (LASIX) 20 MG tablet 1/2 tab daily as needed       . lisinopril (PRINIVIL,ZESTRIL) 20 MG tablet Take 30 mg by mouth daily.       Marland Kitchen omeprazole (PRILOSEC) 40 MG capsule TAKE ONE CAPSULE BY MOUTH ONCE DAILY BEFORE BREAKFAST  30 capsule  11  . SYMBICORT 160-4.5 MCG/ACT inhaler inhale 2 puffs FIRST THING IN THE MORNING AND AGAIN IN THE EVENING 12 HOURS LATER  10.2 g  0  . warfarin (COUMADIN) 5 MG tablet Take 1 tablet (5 mg total) by mouth daily. Take coumadin 3 tablets x 3 then as directed  60 tablet  3  . DISCONTD: amiodarone (PACERONE) 200 MG tablet Take 200 mg by mouth daily.        ROS: No nausea or vomiting. No fever or chills.No melena or hematochezia.No bleeding.No claudication  Physical Exam: BP 149/80  Pulse 80  Ht 5\' 5"  (1.651 m)  Wt 273 lb (123.832 kg)  BMI 45.43 kg/m2 General: Overweight white female in no distress Neck: Short neck, normal carotid upstroke no carotid bruits. No thyromegaly nonnodular thyroid Lungs: Diminished breath sounds bilaterally but no wheezing Cardiac: Regular rate and rhythm with normal S1 and S2 no pathological murmurs Vascular: No edema. Normal distal pulses Skin: Warm and dry Physcologic: Normal affect  12lead ECG: Normal sinus rhythm Limited bedside ECHO:N/A No images are attached to the encounter.   Assessment and Plan  Atrial flutter The patient is status post TEE guided cardioversion for type I atrial flutter with 2:1 AV conduction. At that time the patient was hospitalized with mild heart failure symptoms. She was placed on short-term amiodarone with the anticipation to proceed with radio catheter frequency ablation. We placed the patient on short-term amiodarone because when the patient was in the ER we were unable to slow her down with beta blocker therapy in addition with calcium channel blockers. Therefore I performed an urgent cardioversion with IV propofol in the emergency room under TEE guidance. I was also reluctant to use  beta blockers in high dose because of the patient's history of asthma. TEE was noted for normal LV function despite rapid heart rate of 150 beats per minute period the patient was placed on Lovenox as well as Coumadin. Her INR has now been stable around 2.2. She has an appointment scheduled with Dr. Johney Frame in may for evaluation for radio catheter frequency ablation as the patient given her age is not a good candidate for long-term amiodarone therapy.  Encounter for long-term (current) use of anticoagulants Patient has not been for almost 2 months of anticoagulation her INR was stable today at 2.2.  Obstructive sleep  apnea Patient has an appointment scheduled with Dr. Andrey Campanile to proceed with sleep study. The patient is a very high pretest probability of obstructive sleep apnea and likely will need CPAP. She will see Dr. Andrey Campanile tomorrow.  ASTHMA Reportedly normal PFTs April 2012. Patient is followed by pulmonary.  OBESITY, MORBID Patient is trying to cut down on her caloric intake. I told her that weight reduction is an important part of her therapy.    Patient Active Problem List  Diagnoses  . OBESITY, MORBID  . ASTHMA  . GERD  . RIB PAIN  . DYSPHAGIA  . TUBULOVILLOUS ADENOMA, COLON, HX OF  . COUGH  . Encounter for long-term (current) use of anticoagulants  . Dyspnea  . Atrial flutter  . DDD (degenerative disc disease)  . COPD (chronic obstructive pulmonary disease)  . Obstructive sleep apnea

## 2012-01-16 NOTE — Patient Instructions (Signed)
Continue all current medications. Your physician wants you to follow up in: 6 months.  You will receive a reminder letter in the mail one-two months in advance.  If you don't receive a letter, please call our office to schedule the follow up appointment   

## 2012-02-02 ENCOUNTER — Ambulatory Visit (INDEPENDENT_AMBULATORY_CARE_PROVIDER_SITE_OTHER): Payer: 59 | Admitting: *Deleted

## 2012-02-02 DIAGNOSIS — I4891 Unspecified atrial fibrillation: Secondary | ICD-10-CM

## 2012-02-02 DIAGNOSIS — Z7901 Long term (current) use of anticoagulants: Secondary | ICD-10-CM

## 2012-02-14 ENCOUNTER — Encounter: Payer: Self-pay | Admitting: *Deleted

## 2012-02-14 ENCOUNTER — Encounter: Payer: Self-pay | Admitting: Internal Medicine

## 2012-02-14 ENCOUNTER — Ambulatory Visit (INDEPENDENT_AMBULATORY_CARE_PROVIDER_SITE_OTHER): Payer: 59 | Admitting: Internal Medicine

## 2012-02-14 VITALS — BP 153/70 | HR 82 | Resp 19 | Ht 63.0 in | Wt 252.4 lb

## 2012-02-14 DIAGNOSIS — I4892 Unspecified atrial flutter: Secondary | ICD-10-CM

## 2012-02-14 DIAGNOSIS — J449 Chronic obstructive pulmonary disease, unspecified: Secondary | ICD-10-CM

## 2012-02-14 NOTE — Assessment & Plan Note (Signed)
As above I would like to avoid amiodarone long term

## 2012-02-14 NOTE — Patient Instructions (Signed)
Your physician recommends that you continue on your current medications as directed. Please refer to the Current Medication list given to you today.  Your physician has recommended that you have an ablation. Catheter ablation is a medical procedure used to treat some cardiac arrhythmias (irregular heartbeats). During catheter ablation, a long, thin, flexible tube is put into a blood vessel in your groin (upper thigh), or neck. This tube is called an ablation catheter. It is then guided to your heart through the blood vessel. Radio frequency waves destroy small areas of heart tissue where abnormal heartbeats may cause an arrhythmia to start. Please see the instruction sheet given to you today.   

## 2012-02-14 NOTE — Progress Notes (Signed)
 Primary Care Physician: VYAS,DHRUV B., MD, MD Referring Physician:  Dr DeGent   [name] is a 61 y.o. female with a h/o recently diagnosed atrial flutter who presents today for EP consultation.  She reports initially being diagnosed with atrial flutter 2/13 after presenting to [facility] with tachypalpitations and SOB.  She reports having heart "fluttering" in the setting of bronchitis/ COPD exacerbation in December.  She was on steroids/ albuterol at that time.  She continued to have tachypalpitations and therefore had an echo scheduled by Dr Vyas office.  When she showed up for her echo 2/13, she was found to have atrial flutter with RVR.  She was therefore referred to the [facility] ER.  She underwent TEE guided cardioversion at that time and was initiated on anticoagulation.  She was placed on amiodarone to prevent recurrence and is referred to my office for further consideration of ablation. She reports that her right atrium was enlarged.  She was referred for sleep study (which she completed last night).  Results are presently pending.  She has done well since cardioversion.  She is unaware of any further episodes of atrial flutter.  Today, she denies symptoms of chest pain, shortness of breath (above baseline), orthopnea, PND, lower extremity edema, dizziness, presyncope, syncope, or neurologic sequela.  She has frequent cough with COPD.   The patient is tolerating medications without difficulties and is otherwise without complaint today.   Past Medical History  Diagnosis Date  . Asthma     spirometry 1989: FEV1 2.12 (71%) with ratio 82.  HFA 90% after coaching 11-09-10  . COPD (chronic obstructive pulmonary disease)   . GERD (gastroesophageal reflux disease)     pso EGD with HH / esophagitis 06-30-93  . Osteoarthritis   . DDD (degenerative disc disease)   . Pelvic fracture   . Hiatal hernia   . Migraine   . Anxiety disorder   . Anemia   . Diverticulosis   .  Adenomatous colon polyp   . Atrial flutter     Status post cardioversion 2-12 2013. Discharged on Lovenox but patient did not take Coumadin. Misunderstood instructions. Short-term amiodarone  . Dyspnea     multifactorial, obesity, deconditioning, normal LV function by echocardiogram performed in primary care's office, rule out obstructive sleep apnea  . Hypertension   . Obesity    Past Surgical History  Procedure Date  . Total knee arthroplasty     x2 09/2006  . Rotator cuff repair     right    Current Outpatient Prescriptions  Medication Sig Dispense Refill  . albuterol (VENTOLIN HFA) 108 (90 BASE) MCG/ACT inhaler Inhale 2 puffs into the lungs every 6 (six) hours as needed.        . ALPRAZolam (XANAX) 0.5 MG tablet Take 0.5 mg by mouth daily as needed.        . amiodarone (PACERONE) 400 MG tablet Take 0.5 tablets (200 mg total) by mouth daily.  30 tablet  6  . celecoxib (CELEBREX) 200 MG capsule Take by mouth every other day.        . fluticasone (FLONASE) 50 MCG/ACT nasal spray Place 2 sprays into the nose Daily.      . furosemide (LASIX) 20 MG tablet 1/2 tab daily as needed       . lisinopril (PRINIVIL,ZESTRIL) 20 MG tablet Take 30 mg by mouth daily.       . omeprazole (PRILOSEC) 40 MG capsule TAKE ONE CAPSULE BY MOUTH ONCE DAILY   BEFORE BREAKFAST  30 capsule  11  . SYMBICORT 160-4.5 MCG/ACT inhaler inhale 2 puffs FIRST THING IN THE MORNING AND AGAIN IN THE EVENING 12 HOURS LATER  10.2 g  0  . warfarin (COUMADIN) 5 MG tablet Take 1 tablet (5 mg total) by mouth daily. Take coumadin 3 tablets x 3 then as directed  60 tablet  3    Allergies  Allergen Reactions  . Epinephrine   . Propoxyphene Hcl     History   Social History  . Marital Status: Widowed    Spouse Name: N/A    Number of Children: 0  . Years of Education: N/A   Occupational History  . accounting tech at [employer]    Social History Main Topics  . Smoking status: Former Smoker -- 1.0  packs/day for 20 years    Types: Cigarettes    Quit date: 09/25/1984  . Smokeless tobacco: Never Used  . Alcohol Use: No  . Drug Use: No  . Sexually Active: Not on file   Other Topics Concern  . Not on file   Social History Narrative   Daily caffeine use 1 cup/dayLives in [place] alone.    Family History  Problem Relation Age of Onset  . Emphysema Father     smoker  . Heart disease Father   . Breast cancer Mother   . Breast cancer Sister   . Heart disease Sister   . Colon cancer Neg Hx     ROS- All systems are reviewed and negative except as per the HPI above  Physical Exam: Filed Vitals:   02/14/12 1547  BP: 153/70  Pulse: 82  Resp: 19  Height: 5' 3" (1.6 m)  Weight: 252 lb 6.4 oz (114.488 kg)    GEN- The patient is overweight appearing, alert and oriented x 3 today.   Head- normocephalic, atraumatic Eyes-  Sclera clear, conjunctiva pink Ears- hearing intact Oropharynx- clear Neck- supple, no JVP Lymph- no cervical lymphadenopathy Lungs- Clear with a prolonged expiratory phase Heart- Regular rate and rhythm, no murmurs, rubs or gallops, PMI not laterally displaced GI- soft, NT, ND, + BS Extremities- no clubbing, cyanosis, or edema MS- diffuse muscle atrophy Skin- diffuse small ecchymosis Psych- euthymic mood, full affect Neuro- strength and sensation are intact  EKG today reveals sinus rhythm 82 bpm, PR 142, QRS 84, poor R wave progression EKG 11/06/11 reveals 2:1 atrial flutter  Assessment and Plan:  

## 2012-02-14 NOTE — Assessment & Plan Note (Addendum)
The patient recently presented with symptomatic 2:1 atrial flutter.  She has multiple risk factors for recurrent atrial arrhythmias including obesity, chronic lung disease, frequent use of steroids/ bronchodilators, and possibly sleep apnea.  I think that ablation for atrial flutter is reasonable, particularly as she was very symptomatic and had difficult to control ventricular rates.   Given her lung disease, I think that we need to get her off of amiodarone as soon as possible.    Therapeutic strategies for atrial flutter including medicine and ablation were discussed in detail with the patient today. Risk, benefits, and alternatives to EP study and radiofrequency ablation were also discussed in detail today. These risks include but are not limited to stroke, bleeding, vascular damage, tamponade, perforation, damage to the heart and other structures, AV block requiring pacemaker, worsening renal function, and death.   The patient understands these risk and wishes to proceed.  She is aware that with her lung disease she is at increased risks with anesthesia.  We will proceed with catheter ablation at the next available time.  We will stop amiodarone and coumadin post ablation.  She is aware that if she develops atrial fibrillation in the future then she would again require anticoagulation at that time.

## 2012-02-14 NOTE — Assessment & Plan Note (Signed)
Weight loss is strongly advised  She had a sleep study yesterday.   Results are pending.

## 2012-02-15 ENCOUNTER — Telehealth: Payer: Self-pay | Admitting: Internal Medicine

## 2012-02-15 LAB — BASIC METABOLIC PANEL
BUN: 18 mg/dL (ref 6–23)
CO2: 31 mEq/L (ref 19–32)
GFR: 56.19 mL/min — ABNORMAL LOW (ref >60.00)
Glucose, Bld: 96 mg/dL (ref 70–99)
Potassium: 4.1 mEq/L (ref 3.5–5.1)

## 2012-02-15 LAB — CBC WITH DIFFERENTIAL/PLATELET
Basophils Relative: 1.1 % (ref 0.0–3.0)
Eosinophils Relative: 2.7 % (ref 0.0–5.0)
HCT: 32.8 % — ABNORMAL LOW (ref 36.0–46.0)
Lymphs Abs: 1.3 10*3/uL (ref 0.7–4.0)
MCV: 88 fl (ref 78.0–100.0)
Monocytes Absolute: 0.4 10*3/uL (ref 0.1–1.0)
Neutrophils Relative %: 71.4 % (ref 43.0–77.0)
RBC: 3.72 Mil/uL — ABNORMAL LOW (ref 3.87–5.11)
WBC: 6.7 10*3/uL (ref 4.5–10.5)

## 2012-02-15 NOTE — Telephone Encounter (Signed)
Needs to know what to do about coumadin. She is having a cath/abliation on 5/30

## 2012-02-15 NOTE — Telephone Encounter (Signed)
Spoke with patient and let her know to continue taking this until we see her back in follow up after the ablation.

## 2012-02-15 NOTE — Telephone Encounter (Signed)
PT WANTS TO KNOW IF SHE IS TO STOP AMIODARONE PRIOR TO ABLATION, PLS CALL [phone]

## 2012-02-15 NOTE — Telephone Encounter (Signed)
Pt saw Dr Johney Frame 5/22 and is scheduled for an ablation on 5/30.  Would like INR around 2.0.  Pt frogot to take coumadin last night.  Told to continue regular schedule and keep INR appt for 5/28.  Will hold or decrease coumadin then if need be.

## 2012-02-20 ENCOUNTER — Ambulatory Visit (INDEPENDENT_AMBULATORY_CARE_PROVIDER_SITE_OTHER): Payer: 59 | Admitting: *Deleted

## 2012-02-20 ENCOUNTER — Encounter (HOSPITAL_COMMUNITY): Payer: Self-pay | Admitting: Pharmacy Technician

## 2012-02-20 DIAGNOSIS — I4891 Unspecified atrial fibrillation: Secondary | ICD-10-CM

## 2012-02-20 DIAGNOSIS — Z7901 Long term (current) use of anticoagulants: Secondary | ICD-10-CM

## 2012-02-22 ENCOUNTER — Ambulatory Visit (HOSPITAL_COMMUNITY): Payer: 59 | Admitting: Anesthesiology

## 2012-02-22 ENCOUNTER — Ambulatory Visit: Admit: 2012-02-22 | Payer: Self-pay | Admitting: Internal Medicine

## 2012-02-22 ENCOUNTER — Encounter (HOSPITAL_COMMUNITY): Payer: Self-pay | Admitting: Anesthesiology

## 2012-02-22 ENCOUNTER — Ambulatory Visit (HOSPITAL_COMMUNITY): Admit: 2012-02-22 | Payer: Self-pay | Admitting: Internal Medicine

## 2012-02-22 ENCOUNTER — Encounter (HOSPITAL_COMMUNITY): Payer: Self-pay

## 2012-02-22 ENCOUNTER — Ambulatory Visit (HOSPITAL_COMMUNITY)
Admission: RE | Admit: 2012-02-22 | Discharge: 2012-02-23 | Disposition: A | Discharge status: Home / Self Care | Payer: 59 | Source: Ambulatory Visit | Attending: Internal Medicine | Admitting: Internal Medicine

## 2012-02-22 ENCOUNTER — Encounter (HOSPITAL_COMMUNITY): Payer: Self-pay | Admitting: General Practice

## 2012-02-22 ENCOUNTER — Encounter (HOSPITAL_COMMUNITY)
Admission: RE | Disposition: A | Discharge status: Home / Self Care | Payer: Self-pay | Source: Ambulatory Visit | Attending: Internal Medicine

## 2012-02-22 DIAGNOSIS — I4892 Unspecified atrial flutter: Principal | ICD-10-CM | POA: Insufficient documentation

## 2012-02-22 DIAGNOSIS — Z23 Encounter for immunization: Secondary | ICD-10-CM | POA: Insufficient documentation

## 2012-02-22 DIAGNOSIS — J45909 Unspecified asthma, uncomplicated: Secondary | ICD-10-CM | POA: Insufficient documentation

## 2012-02-22 DIAGNOSIS — E669 Obesity, unspecified: Secondary | ICD-10-CM | POA: Insufficient documentation

## 2012-02-22 DIAGNOSIS — J45991 Cough variant asthma: Secondary | ICD-10-CM | POA: Insufficient documentation

## 2012-02-22 DIAGNOSIS — G4733 Obstructive sleep apnea (adult) (pediatric): Secondary | ICD-10-CM | POA: Insufficient documentation

## 2012-02-22 DIAGNOSIS — I10 Essential (primary) hypertension: Secondary | ICD-10-CM | POA: Insufficient documentation

## 2012-02-22 DIAGNOSIS — K219 Gastro-esophageal reflux disease without esophagitis: Secondary | ICD-10-CM | POA: Insufficient documentation

## 2012-02-22 HISTORY — DX: Pneumonia, unspecified organism: J18.9

## 2012-02-22 HISTORY — DX: Fibromyalgia: M79.7

## 2012-02-22 HISTORY — PX: ATRIAL FLUTTER ABLATION: SHX5733

## 2012-02-22 HISTORY — DX: Anxiety disorder, unspecified: F41.9

## 2012-02-22 HISTORY — DX: Sleep apnea, unspecified: G47.30

## 2012-02-22 HISTORY — DX: Other specified postprocedural states: R11.2

## 2012-02-22 HISTORY — PX: A FLUTTER ABLATION: SHX5348

## 2012-02-22 HISTORY — DX: Other specified postprocedural states: Z98.890

## 2012-02-22 LAB — PROTIME-INR: Prothrombin Time: 18.2 seconds — ABNORMAL HIGH (ref 11.6–15.2)

## 2012-02-22 SURGERY — ATRIAL FLUTTER ABLATION
Anesthesia: LOCAL

## 2012-02-22 SURGERY — ABLATION, ARRHYTHMOGENIC FOCUS, FOR ATRIAL FLUTTER
Anesthesia: General

## 2012-02-22 SURGERY — ATRIAL FLUTTER ABLATION
Anesthesia: General

## 2012-02-22 MED ORDER — CHLORHEXIDINE GLUCONATE 0.12 % MT SOLN
15.0000 mL | Freq: Two times a day (BID) | OROMUCOSAL | Status: DC
  Administered 2012-02-22 – 2012-02-23 (×2): 15 mL via OROMUCOSAL
  Filled 2012-02-22 (×4): qty 15

## 2012-02-22 MED ORDER — ONDANSETRON HCL 4 MG/2ML IJ SOLN: 4.0000 mg | Freq: Four times a day (QID) | INTRAMUSCULAR | Status: DC | PRN

## 2012-02-22 MED ORDER — FENTANYL CITRATE 0.05 MG/ML IJ SOLN
INTRAMUSCULAR | Status: DC | PRN
  Administered 2012-02-22: 150 ug via INTRAVENOUS

## 2012-02-22 MED ORDER — SODIUM CHLORIDE 0.9 % IV SOLN: 250.0000 mL | INTRAVENOUS | Status: DC | PRN

## 2012-02-22 MED ORDER — ONDANSETRON HCL 4 MG/2ML IJ SOLN
INTRAMUSCULAR | Status: DC | PRN
  Administered 2012-02-22: 4 mg via INTRAVENOUS

## 2012-02-22 MED ORDER — LACTATED RINGERS IV SOLN
INTRAVENOUS | Status: DC | PRN
  Administered 2012-02-22 (×2): via INTRAVENOUS

## 2012-02-22 MED ORDER — PROPOFOL 10 MG/ML IV EMUL
INTRAVENOUS | Status: DC | PRN
  Administered 2012-02-22: 200 mg via INTRAVENOUS

## 2012-02-22 MED ORDER — EPHEDRINE SULFATE 50 MG/ML IJ SOLN
INTRAMUSCULAR | Status: DC | PRN
  Administered 2012-02-22: 5 mg via INTRAVENOUS
  Administered 2012-02-22: 10 mg via INTRAVENOUS

## 2012-02-22 MED ORDER — LIDOCAINE HCL (CARDIAC) 20 MG/ML IV SOLN
INTRAVENOUS | Status: DC | PRN
  Administered 2012-02-22: 80 mg via INTRAVENOUS

## 2012-02-22 MED ORDER — SODIUM CHLORIDE 0.9 % IJ SOLN
3.0000 mL | Freq: Two times a day (BID) | INTRAMUSCULAR | Status: DC
  Administered 2012-02-22: 3 mL via INTRAVENOUS

## 2012-02-22 MED ORDER — ACETAMINOPHEN 325 MG PO TABS
650.0000 mg | ORAL_TABLET | ORAL | Status: DC | PRN
  Administered 2012-02-22: 650 mg via ORAL
  Filled 2012-02-22: qty 2

## 2012-02-22 MED ORDER — MIDAZOLAM HCL 5 MG/5ML IJ SOLN
INTRAMUSCULAR | Status: DC | PRN
  Administered 2012-02-22: 2 mg via INTRAVENOUS

## 2012-02-22 MED ORDER — BUPIVACAINE HCL (PF) 0.25 % IJ SOLN
INTRAMUSCULAR | Status: AC
  Filled 2012-02-22: qty 60

## 2012-02-22 MED ORDER — HYDROCODONE-ACETAMINOPHEN 5-325 MG PO TABS
1.0000 | ORAL_TABLET | ORAL | Status: DC | PRN
  Administered 2012-02-22: 1 via ORAL
  Administered 2012-02-22 – 2012-02-23 (×2): 2 via ORAL
  Filled 2012-02-22 (×3): qty 2

## 2012-02-22 MED ORDER — BIOTENE DRY MOUTH MT LIQD
15.0000 mL | Freq: Two times a day (BID) | OROMUCOSAL | Status: DC
  Administered 2012-02-22: 15 mL via OROMUCOSAL

## 2012-02-22 MED ORDER — SODIUM CHLORIDE 0.9 % IJ SOLN: 3.0000 mL | INTRAMUSCULAR | Status: DC | PRN

## 2012-02-22 MED ORDER — ALPRAZOLAM 0.5 MG PO TABS
0.5000 mg | ORAL_TABLET | Freq: Every evening | ORAL | Status: DC | PRN
  Filled 2012-02-22: qty 1

## 2012-02-22 MED ORDER — ALBUTEROL SULFATE HFA 108 (90 BASE) MCG/ACT IN AERS
2.0000 | INHALATION_SPRAY | Freq: Four times a day (QID) | RESPIRATORY_TRACT | Status: DC | PRN
  Filled 2012-02-22: qty 6.7

## 2012-02-22 MED ORDER — LISINOPRIL 20 MG PO TABS
30.0000 mg | ORAL_TABLET | Freq: Every day | ORAL | Status: DC
  Administered 2012-02-23: 30 mg via ORAL
  Filled 2012-02-22: qty 1

## 2012-02-22 MED ORDER — PNEUMOCOCCAL VAC POLYVALENT 25 MCG/0.5ML IJ INJ
0.5000 mL | INJECTION | Freq: Once | INTRAMUSCULAR | Status: AC
  Administered 2012-02-22: 0.5 mL via INTRAMUSCULAR
  Filled 2012-02-22: qty 0.5

## 2012-02-22 MED ORDER — BUDESONIDE-FORMOTEROL FUMARATE 160-4.5 MCG/ACT IN AERO
2.0000 | INHALATION_SPRAY | Freq: Two times a day (BID) | RESPIRATORY_TRACT | Status: DC
  Administered 2012-02-22 – 2012-02-23 (×2): 2 via RESPIRATORY_TRACT
  Filled 2012-02-22: qty 6

## 2012-02-22 MED ORDER — HYDROMORPHONE HCL PF 1 MG/ML IJ SOLN: 0.2500 mg | INTRAMUSCULAR | Status: DC | PRN

## 2012-02-22 MED ORDER — SUCCINYLCHOLINE CHLORIDE 20 MG/ML IJ SOLN
INTRAMUSCULAR | Status: DC | PRN
  Administered 2012-02-22: 100 mg via INTRAVENOUS

## 2012-02-22 MED ORDER — CELECOXIB 200 MG PO CAPS
200.0000 mg | ORAL_CAPSULE | ORAL | Status: DC
  Administered 2012-02-23: 200 mg via ORAL
  Filled 2012-02-22: qty 1

## 2012-02-22 MED ORDER — ALBUMIN HUMAN 5 % IV SOLN
INTRAVENOUS | Status: DC | PRN
  Administered 2012-02-22: 11:00:00 via INTRAVENOUS

## 2012-02-22 NOTE — Anesthesia Procedure Notes (Signed)
Procedure Name: Intubation Date/Time: 02/22/2012 10:36 AM Performed by: Garen Lah Pre-anesthesia Checklist: Patient identified, Patient being monitored, Emergency Drugs available, Timeout performed and Suction available Patient Re-evaluated:Patient Re-evaluated prior to inductionOxygen Delivery Method: Circle system utilized Preoxygenation: Pre-oxygenation with 100% oxygen Intubation Type: IV induction Ventilation: Mask ventilation without difficulty Grade View: Grade III Tube type: Oral Tube size: 7.5 mm Number of attempts: 1 Airway Equipment and Method: Video-laryngoscopy and Stylet Placement Confirmation: ETT inserted through vocal cords under direct vision,  positive ETCO2 and breath sounds checked- equal and bilateral Secured at: 21 cm Tube secured with: Tape Dental Injury: Teeth and Oropharynx as per pre-operative assessment  Difficulty Due To: Difficulty was anticipated, Difficult Airway- due to reduced neck mobility and Difficult Airway- due to limited oral opening

## 2012-02-22 NOTE — Progress Notes (Signed)
Pt placed on CPAP in PACU from EP Lab. RT was called for CPAP in EP Lab. Pt was intubated for procedure and after extubation but was hard to wake up. CPAP was placed on pt in auto mode. Pt SPO2 came up to 100% with 6L O2 placed in line. Pt became more awake, but left on CPAP to awaken some more. RT will continue to monitor.

## 2012-02-22 NOTE — H&P (View-Only) (Signed)
Primary Care Physician: Ignatius Specking., MD, MD Referring Physician:  Dr Austin Miles Standley Brooking is a 61 y.o. female with a h/o recently diagnosed atrial flutter who presents today for EP consultation.  She reports initially being diagnosed with atrial flutter 2/13 after presenting to [facility] with tachypalpitations and SOB.  She reports having heart "fluttering" in the setting of bronchitis/ COPD exacerbation in December.  She was on steroids/ albuterol at that time.  She continued to have tachypalpitations and therefore had an echo scheduled by Dr Sherril Croon office.  When she showed up for her echo 2/13, she was found to have atrial flutter with RVR.  She was therefore referred to the [facility] ER.  She underwent TEE guided cardioversion at that time and was initiated on anticoagulation.  She was placed on amiodarone to prevent recurrence and is referred to my office for further consideration of ablation. She reports that her right atrium was enlarged.  She was referred for sleep study (which she completed last night).  Results are presently pending.  She has done well since cardioversion.  She is unaware of any further episodes of atrial flutter.  Today, she denies symptoms of chest pain, shortness of breath (above baseline), orthopnea, PND, lower extremity edema, dizziness, presyncope, syncope, or neurologic sequela.  She has frequent cough with COPD.   The patient is tolerating medications without difficulties and is otherwise without complaint today.   Past Medical History  Diagnosis Date  . Asthma     spirometry 1989: FEV1 2.12 (71%) with ratio 82.  HFA 90% after coaching 11-09-10  . COPD (chronic obstructive pulmonary disease)   . GERD (gastroesophageal reflux disease)     pso EGD with HH / esophagitis 06-30-93  . Osteoarthritis   . DDD (degenerative disc disease)   . Pelvic fracture   . Hiatal hernia   . Migraine   . Anxiety disorder   . Anemia   . Diverticulosis   .  Adenomatous colon polyp   . Atrial flutter     Status post cardioversion 2-12 2013. Discharged on Lovenox but patient did not take Coumadin. Misunderstood instructions. Short-term amiodarone  . Dyspnea     multifactorial, obesity, deconditioning, normal LV function by echocardiogram performed in primary care's office, rule out obstructive sleep apnea  . Hypertension   . Obesity    Past Surgical History  Procedure Date  . Total knee arthroplasty     x2 09/2006  . Rotator cuff repair     right    Current Outpatient Prescriptions  Medication Sig Dispense Refill  . albuterol (VENTOLIN HFA) 108 (90 BASE) MCG/ACT inhaler Inhale 2 puffs into the lungs every 6 (six) hours as needed.        . ALPRAZolam (XANAX) 0.5 MG tablet Take 0.5 mg by mouth daily as needed.        Marland Kitchen amiodarone (PACERONE) 400 MG tablet Take 0.5 tablets (200 mg total) by mouth daily.  30 tablet  6  . celecoxib (CELEBREX) 200 MG capsule Take by mouth every other day.        . fluticasone (FLONASE) 50 MCG/ACT nasal spray Place 2 sprays into the nose Daily.      . furosemide (LASIX) 20 MG tablet 1/2 tab daily as needed       . lisinopril (PRINIVIL,ZESTRIL) 20 MG tablet Take 30 mg by mouth daily.       Marland Kitchen omeprazole (PRILOSEC) 40 MG capsule TAKE ONE CAPSULE BY MOUTH ONCE DAILY  BEFORE BREAKFAST  30 capsule  11  . SYMBICORT 160-4.5 MCG/ACT inhaler inhale 2 puffs FIRST THING IN THE MORNING AND AGAIN IN THE EVENING 12 HOURS LATER  10.2 g  0  . warfarin (COUMADIN) 5 MG tablet Take 1 tablet (5 mg total) by mouth daily. Take coumadin 3 tablets x 3 then as directed  60 tablet  3    Allergies  Allergen Reactions  . Epinephrine   . Propoxyphene Hcl     History   Social History  . Marital Status: Widowed    Spouse Name: N/A    Number of Children: 0  . Years of Education: N/A   Occupational History  . Runner, broadcasting/film/video at [employer]    Social History Main Topics  . Smoking status: Former Smoker -- 1.0  packs/day for 20 years    Types: Cigarettes    Quit date: 09/25/1984  . Smokeless tobacco: Never Used  . Alcohol Use: No  . Drug Use: No  . Sexually Active: Not on file   Other Topics Concern  . Not on file   Social History Narrative   Daily caffeine use 1 cup/dayLives in [place] alone.    Family History  Problem Relation Age of Onset  . Emphysema Father     smoker  . Heart disease Father   . Breast cancer Mother   . Breast cancer Sister   . Heart disease Sister   . Colon cancer Neg Hx     ROS- All systems are reviewed and negative except as per the HPI above  Physical Exam: Filed Vitals:   02/14/12 1547  BP: 153/70  Pulse: 82  Resp: 19  Height: 5\' 3"  (1.6 m)  Weight: 252 lb 6.4 oz (114.488 kg)    GEN- The patient is overweight appearing, alert and oriented x 3 today.   Head- normocephalic, atraumatic Eyes-  Sclera clear, conjunctiva pink Ears- hearing intact Oropharynx- clear Neck- supple, no JVP Lymph- no cervical lymphadenopathy Lungs- Clear with a prolonged expiratory phase Heart- Regular rate and rhythm, no murmurs, rubs or gallops, PMI not laterally displaced GI- soft, NT, ND, + BS Extremities- no clubbing, cyanosis, or edema MS- diffuse muscle atrophy Skin- diffuse small ecchymosis Psych- euthymic mood, full affect Neuro- strength and sensation are intact  EKG today reveals sinus rhythm 82 bpm, PR 142, QRS 84, poor R wave progression EKG 11/06/11 reveals 2:1 atrial flutter  Assessment and Plan:

## 2012-02-22 NOTE — Op Note (Signed)
NAMEREBEKKA, LOBELLO NO.:  [id number]  MEDICAL RECORD NO.:  [id number]  LOCATION:  [facility]                         FACILITY:  [facility]  PHYSICIAN:  Hillis Range, MD       DATE OF BIRTH:  [birth year]  DATE OF PROCEDURE: DATE OF DISCHARGE:                              OPERATIVE REPORT   SURGEON:  Hillis Range, MD  PREPROCEDURE DIAGNOSIS:  Typical appearing atrial flutter.  POSTPROCEDURE DIAGNOSIS:  Atrial flutter.  PROCEDURES: 1. Comprehensive EP study. 2. Coronary sinus pacing and recording. 3. Mapping of SVT (atrial flutter). 4. Ablation of SVT (atrial flutter).  INTRODUCTION:  [name] is a pleasant 61 year old female with a history of chronic lung disease and morbid obesity.  She recently presented to Dr. Andee Lineman with symptomatic typical appearing atrial flutter with rapid ventricular rates.  She required cardioversion due to symptomatic atrial flutter.  She was placed empirically on amiodarone and adequately anticoagulated.  She now presents for EP study and radiofrequency ablation.  DESCRIPTION OF THE PROCEDURE:  Informed written consent was obtained, and the patient was brought to the electrophysiology lab in a fasting state.  She was adequately sedated with general anesthesia as outlined in the anesthesia report.  The patient's right groin was prepped and draped in the usual sterile fashion by the EP lab staff.  Using a percutaneous Seldinger technique, two 6-French and one 8-French hemostasis sheaths were placed into the right common femoral vein.  A 6- French decapolar Polaris X catheter was introduced through the right common femoral vein and advanced into the coronary sinus for recording and pacing from this location.  A 6-French quadripolar Josephson catheter was introduced through the right common femoral vein and advanced into the right ventricle for recording and pacing.  This catheter was then pulled back to the His bundle location.  The  patient presented to the electrophysiology lab in normal sinus rhythm.  Her PR interval measured 139 msec with a QRS duration of 80 msec and a QT interval of 465 msec.  Her AH interval measured 73 msec with an HV interval of 41 msec.  Ventricular pacing was performed, which revealed midline concentric decremental VA conduction with a VA Wenckebach cycle length of 500 msec.  Rapid atrial pacing was performed, which revealed no evidence of PR greater than RR.  The AV Wenckebach cycle length was 360 msec.  Attempts were made to induce atrial flutter with rapid atrial pacing down to a cycle length of 240 msec.  Atrial flutter could not be induced today.  As the patient has previously had typical appearing atrial flutter which was felt to likely be cavotricuspid isthmus dependent, I elected to perform empiric cavotricuspid isthmus ablation today.  A 7- [place] [employer] II XP 8 mm ablation catheter was introduced through the right common femoral vein and advanced into the right atrium.  Mapping of Koch's triangle was performed along the atrial side.  This demonstrated a rather standard isthmus.  A series of 11 radiofrequency applications were delivered between the tricuspid valve anulus and the inferior vena cava along the usual cavotricuspid isthmus with a target temperature of 60 degrees at 60 watts for 120 seconds each.  Isthmus  block was initially not achieved with this approach.  I therefore advanced a 8.5-French ramp sheath through the right common femoral vein into the low right atrium.  The ablation catheter was reintroduced through this ramp sheath and again positioned along the cavotricuspid isthmus.  A series of 5 additional radiofrequency applications were delivered along the isthmus with isthmus block achieved.  A 6 radiofrequency application was then delivered as a bonus burn.  Following ablation, differential atrial pacing was performed from the low lateral right  atrium which confirmed complete bidirectional cavotricuspid isthmus block. The stimulus to earliest activation measured bidirectionally across the isthmus. The patient was observed for 20 minutes without return of conduction through the isthmus.  Following ablation, the AV Wenckebach cycle length measured 350 msec.  Rapid atrial pacing was again continued down to a cycle length of 250 msec with no arrhythmias observed.  Following ablation, the AH interval measured 80 msec with an HV interval of 50 msec.  The procedure was therefore considered completed.  All catheters were removed and the sheaths were aspirated and flushed.  The sheaths were removed and hemostasis was assured.  There were no early apparent complications.  CONCLUSIONS: 1. Sinus rhythm upon presentation. 2. No evidence of accessory pathways or dual AV nodal physiology. 3. No inducible atrial flutter today, however, clinically the patient     has had typical right atrial flutter and therefore I elected to     perform cavotricuspid isthmus ablation. 4. Complete cavotricuspid isthmus block was achieved with ablation. 5. No inducible arrhythmias following ablation. 6. No early apparent complications.     Hillis Range, MD     JA/MEDQ  D:  02/22/2012  T:  02/22/2012  Job:  [id number]  cc:   Learta Codding, MD,FACC Epic Chart

## 2012-02-22 NOTE — Interval H&P Note (Signed)
History and Physical Interval Note:  02/22/2012 9:52 AM  [name]  has presented today for surgery, with the diagnosis of aflutter  The various methods of treatment have been discussed with the patient and family. After consideration of risks, benefits and other options for treatment, the patient has consented to  Procedure(s) (LRB): ATRIAL FLUTTER ABLATION (N/A) as a surgical intervention .  The patients' history has been reviewed, patient examined, no change in status, stable for surgery.  I have reviewed the patients' chart and labs.  Questions were answered to the patient's satisfaction.     Hillis Range

## 2012-02-22 NOTE — Preoperative (Signed)
Beta Blockers   Reason not to administer Beta Blockers:Not Applicable. No home beta blockers 

## 2012-02-22 NOTE — Transfer of Care (Signed)
Immediate Anesthesia Transfer of Care Note  Patient: [name]  Procedure(s) Performed: Procedure(s) (LRB): ATRIAL FLUTTER ABLATION (N/A)  Patient Location: PACU  Anesthesia Type: General  Level of Consciousness: alert  and oriented  Airway & Oxygen Therapy: Patient Spontanous Breathing and Patient connected to face mask oxygen  Post-op Assessment: Report given to PACU RN and Post -op Vital signs reviewed and stable  Post vital signs: Reviewed  Complications: respiratory complications. Pt. Became apneic after extubation.  Mask ventilation with positive pressure.  Ventilations resumed on own. Patient taken to OR PACU and placed on CPAP.

## 2012-02-22 NOTE — Anesthesia Preprocedure Evaluation (Addendum)
Anesthesia Evaluation  Patient identified by MRN, date of birth, ID band  Reviewed: Allergy & Precautions, H&P , NPO status , Patient's Chart, lab work & pertinent test results, reviewed documented beta blocker date and time   Airway Mallampati: II      Dental  (+) Teeth Intact and Dental Advisory Given   Pulmonary shortness of breath and with exertion, asthma , sleep apnea , COPD COPD inhaler,  breath sounds clear to auscultation        Cardiovascular hypertension, Pt. on medications + dysrhythmias Atrial Fibrillation Rhythm:Regular Rate:Normal     Neuro/Psych  Headaches, PSYCHIATRIC DISORDERS Anxiety  Neuromuscular disease    GI/Hepatic Neg liver ROS, hiatal hernia, GERD-  Controlled,  Endo/Other    Renal/GU negative Renal ROS     Musculoskeletal  (+) Arthritis -,   Abdominal   Peds  Hematology negative hematology ROS (+)   Anesthesia Other Findings   Reproductive/Obstetrics                          Anesthesia Physical Anesthesia Plan  ASA: IV  Anesthesia Plan: General   Post-op Pain Management:    Induction: Intravenous  Airway Management Planned: Oral ETT  Additional Equipment:   Intra-op Plan:   Post-operative Plan: Possible Post-op intubation/ventilation  Informed Consent: I have reviewed the patients History and Physical, chart, labs and discussed the procedure including the risks, benefits and alternatives for the proposed anesthesia with the patient or authorized representative who has indicated his/her understanding and acceptance.   Dental advisory given  Plan Discussed with: CRNA, Anesthesiologist and Surgeon  Anesthesia Plan Comments:        Anesthesia Quick Evaluation

## 2012-02-22 NOTE — Anesthesia Postprocedure Evaluation (Signed)
  Anesthesia Post-op Note  Patient: [name]  Procedure(s) Performed: Procedure(s) (LRB): ATRIAL FLUTTER ABLATION (N/A)  Patient Location: PACU  Anesthesia Type: General  Level of Consciousness: awake  Airway and Oxygen Therapy: Patient Spontanous Breathing  Post-op Pain: mild  Post-op Assessment: Post-op Vital signs reviewed  Post-op Vital Signs: Reviewed  Complications: No apparent anesthesia complications

## 2012-02-22 NOTE — Progress Notes (Signed)
Doing well s/p atrial flutter ablation I have stopped amiodarone and coumadin.  CPAP compliance is encouraged.  Follow-up with me in [place] office in 4 weeks.

## 2012-02-22 NOTE — Brief Op Note (Signed)
02/22/2012  12:29 PM  PATIENT:  [name]  61 y.o. female  PRE-OPERATIVE DIAGNOSIS:  aflutter  POST-OPERATIVE DIAGNOSIS:  * No post-op diagnosis entered *  PROCEDURE:  Procedure(s) (LRB): ATRIAL FLUTTER ABLATION (N/A)  SURGEON:  Surgeon(s) and Role:    * Hillis Range, MD - Primary  PHYSICIAN ASSISTANT:   ASSISTANTS: none   ANESTHESIA:   general  EBL:  Total I/O In: 1250 [I.V.:1000; IV Piggyback:250] Out: 0   BLOOD ADMINISTERED:none  DRAINS: none   LOCAL MEDICATIONS USED:  LIDOCAINE   SPECIMEN:  No Specimen  DISPOSITION OF SPECIMEN:  N/A  COUNTS:  YES  TOURNIQUET:  * No tourniquets in log *  DICTATION: .Other Dictation: Dictation Number [id number]  PLAN OF CARE: Admit for overnight observation  PATIENT DISPOSITION:  PACU - hemodynamically stable.   Delay start of Pharmacological VTE agent (>24hrs) due to surgical blood loss or risk of bleeding: not applicable

## 2012-02-22 NOTE — Progress Notes (Signed)
1300 02/22/2012  Venous sheathes x 3 removed without difficulty. No bleeding or hematoma. Dressing applied.

## 2012-02-22 NOTE — H&P (Signed)
ELECTROPHYSIOLOGY HISTORY & PHYSICAL  Patient ID: [name] MRN: [id number], DOB/AGE: 1950/10/23   Date of Admission: 02/22/2012  Primary Physician: Ignatius Specking., MD Primary Cardiologist: Andee Lineman, MD Primary Electrophysiologist: Johney Frame, MD   Reason for Admission: Atrial flutter ablation  History of Present Illness Please see Dr. Jenel Lucks recent consult note for full details. Briefly, [name] is a 61 year old woman with recently diagnosed atrial flutter, Feb 2013, when she presented to [facility] with tachypalpitations and SOB. She describes heart "fluttering" in the setting of bronchitis/ COPD exacerbation. She was on steroids/albuterol at that time. She continued to have tachypalpitations and therefore had an echo scheduled by Dr Sherril Croon office. During her echocardiogram she was found to have atrial flutter with RVR. She was then referred to [facility] ED. She underwent TEE guided cardioversion and was initiated on anticoagulation. She was placed on amiodarone to prevent recurrence and was referred to Dr. Johney Frame for consideration of ablation. She has done well since cardioversion. She is unaware of any further episodes of atrial flutter. Today, she denies symptoms of chest pain, worsening shortness of breath, orthopnea, PND, lower extremity edema, dizziness, presyncope or syncope. She has frequent cough with COPD. The patient is tolerating her medication regimen without difficulty. During her consultation with Dr. Johney Frame, therapeutic strategies for atrial flutter including medicine and ablation were discussed in detail. She has multiple risk factors for recurrent atrial arrhythmias including obesity, chronic lung disease, frequent use of steroids/ bronchodilators and probable sleep apnea (recent sleep study completed). Dr. Johney Frame felt that ablation for atrial flutter was reasonable, particularly because she was very symptomatic and had rates that were difficult to control. He  also recommended amiodarone be discontinued following ablation to avoid any long term pulmonary side effects given her underlying lung disease at baseline. The procedure, risks and benefits, were reviewed in detail and [name] expressed understanding and agreed to proceed. She presents today for EP study with 3-D mapping and RF ablation of atrial flutter. She denies any changes to her health, medications or medical history since her visit with Dr. Johney Frame on 02/14/2012. She has no further questions regarding the procedure and has agreed to proceed.  Past Medical History  Diagnosis Date  . Asthma     spirometry 1989: FEV1 2.12 (71%) with ratio 82.  HFA 90% after coaching 11-09-10  . COPD (chronic obstructive pulmonary disease)   . GERD (gastroesophageal reflux disease)     pso EGD with HH / esophagitis 06-30-93  . Osteoarthritis   . DDD (degenerative disc disease)   . Pelvic fracture   . Hiatal hernia   . Migraine   . Anxiety disorder   . Anemia   . Diverticulosis   . Adenomatous colon polyp   . Atrial flutter     Status post cardioversion 2-12 2013. Discharged on Lovenox but patient did not take Coumadin. Misunderstood instructions. Short-term amiodarone  . Dyspnea     multifactorial, obesity, deconditioning, normal LV function by echocardiogram performed in primary care's office, rule out obstructive sleep apnea  . Hypertension   . Obesity     Past Surgical History  Procedure Date  . Total knee arthroplasty     x2 09/2006  . Rotator cuff repair     right    Allergies/Intolerances Allergies  Allergen Reactions  . Epinephrine Other (See Comments)    Pallpitations during dental procedures  . Propoxyphene Hcl Nausea And Vomiting  . Theophyllines     Home Medications . albuterol (VENTOLIN  HFA) 108 (90 BASE) MCG/ACT inhaler Inhale 2 puffs into the lungs every 6 (six) hours as needed. For shortness of breath      . ALPRAZolam (XANAX) 0.5 MG tablet Take 0.5 mg by mouth daily as  needed. For anxiety      . amiodarone (PACERONE) 400 MG tablet Take 200 mg by mouth daily.      . budesonide-formoterol (SYMBICORT) 160-4.5 MCG/ACT inhaler Inhale 2 puffs into the lungs 2 (two) times daily.      . celecoxib (CELEBREX) 200 MG capsule Take 200 mg by mouth every other day.       . fluticasone (FLONASE) 50 MCG/ACT nasal spray Place 2 sprays into the nose daily.       . furosemide (LASIX) 20 MG tablet Take 10 mg by mouth daily as needed. For edema      . lisinopril (PRINIVIL,ZESTRIL) 20 MG tablet Take 30 mg by mouth daily.      Marland Kitchen omeprazole (PRILOSEC) 40 MG capsule Take 40 mg by mouth daily with breakfast.      . warfarin (COUMADIN) 5 MG tablet Take 5-10 mg by mouth daily. 5mg  Monday, Wednesday, Friday; 10mg  Sunday. Tuesday, Thursday, Saturday      Family History Family History  Problem Relation Age of Onset  . Emphysema Father     smoker  . Heart disease Father   . Breast cancer Mother   . Breast cancer Sister   . Heart disease Sister   . Colon cancer Neg Hx     Social History Social History  . Marital Status: Widowed    Spouse Name: N/A    Number of Children: 0  . Years of Education: N/A   Occupational History  . Runner, broadcasting/film/video at [employer]    Social History Main Topics  . Smoking status: Former Smoker -- 1.0 packs/day for 20 years    Types: Cigarettes    Quit date: 09/25/1984  . Smokeless tobacco: Never Used  . Alcohol Use: No  . Drug Use: No  . Sexually Active: Not on file   Social History Narrative   Daily caffeine use 1 cup/day[place] in [place].    Review of Systems General:  No chills, fever, night sweats or weight changes.  Cardiovascular:  No chest pain, dyspnea on exertion, edema, orthopnea, palpitations, paroxysmal nocturnal dyspnea. Dermatological: No rash, lesions or masses. Respiratory: No cough, dyspnea. Urologic: No hematuria, dysuria. Abdominal:   No nausea, vomiting, diarrhea, bright red blood per rectum, melena, or  hematemesis. Neurologic:  No visual changes, weakness, changes in mental status. All other systems reviewed and are otherwise negative except as noted above.  Physical Exam Blood pressure 140/57, pulse 92, temperature 98.5 F (36.9 C), temperature source Oral, resp. rate 18, height 5' 0.5" (1.537 m), weight 262 lb (118.842 kg), SpO2 97.00%.  General: Well developed, well appearing, overweight 61 year old female in no acute distress. HEENT: Normocephalic, atraumatic. EOMs intact. Sclera nonicteric. Oropharynx clear.  Neck: Supple without bruits. No JVD. Lungs:  Respirations regular and unlabored, CTA bilaterally. No wheezes, rales or rhonchi. Heart: RRR. S1, S2 present. No murmurs, rub, S3 or S4. Abdomen: Soft, non-tender, non-distended. BS present x 4 quadrants. No hepatosplenomegaly.  Extremities: No clubbing, cyanosis or edema. DP/PT/Radials 2+ and equal bilaterally. Psych: Normal affect. Neuro: Alert and oriented X 3. Moves all extremities spontaneously.   Labs 02/14/2012 -  WBC 6700, Hgb 10.5, Hct 32.8, PLT 329,000 Sodium 139, potassium 4.1, BUN 18, creatinine 1.1 02/22/2012 -  INR 1.48   Radiology/Studies No results found.  Assessment and Plan Per Dr. Jenel Lucks consult note - "The patient recently presented with symptomatic 2:1 atrial flutter. She has multiple risk factors for recurrent atrial arrhythmias including obesity, chronic lung disease, frequent use of steroids/ bronchodilators, and possibly sleep apnea. I think that ablation for atrial flutter is reasonable, particularly as she was very symptomatic and had difficult to control ventricular rates. Given her lung disease, I think that we need to get her off of amiodarone as soon as possible. Therapeutic strategies for atrial flutter including medicine and ablation were discussed in detail with the patient today. Risk, benefits, and alternatives to EP study and radiofrequency ablation were also discussed in detail today. These  risks include but are not limited to stroke, bleeding, vascular damage, tamponade, perforation, damage to the heart and other structures, AV block requiring pacemaker, worsening renal function, and death. The patient understands these risk and wishes to proceed. She is aware that with her lung disease she is at increased risks with anesthesia. We will proceed with catheter ablation at the next available time.  We will stop amiodarone and coumadin post ablation. She is aware that if she develops atrial fibrillation in the future then she would again require anticoagulation at that time."   Signed, Rick Duff, PA-C 02/22/2012, 8:55 AM  I have seen, examined the patient, and reviewed the above assessment and plan.   Co Sign: Hillis Range, MD 02/22/2012 2:58 PM

## 2012-02-23 ENCOUNTER — Ambulatory Visit: Payer: Self-pay | Admitting: *Deleted

## 2012-02-23 DIAGNOSIS — Z7901 Long term (current) use of anticoagulants: Secondary | ICD-10-CM

## 2012-02-23 NOTE — Discharge Summary (Signed)
ELECTROPHYSIOLOGY DISCHARGE SUMMARY    Patient ID: [name],  MRN: [id number], DOB/AGE: June 11, 1951 61 y.o.  Admit date: 02/22/2012 Discharge date: 02/23/2012  Primary Care Physician: Ignatius Specking., MD, MD Primary Cardiologist: Andee Lineman, MD Primary Electrophysiologist: Johney Frame, MD  Primary Discharge Diagnosis:  1.  Atrial flutter s/p RF ablation  Secondary Discharge Diagnoses:  1.  Asthma 2.  HTN 3.  Obesity 4.  Migraine HAs 5.  DDD 6.  GERD  Procedures This Admission: 1.  Comprehensive EP study, coronary sinus pacing and recording, mapping of SVT (atrial flutter), ablation of SVT (atrial flutter); conclusions as follows - Sinus rhythm upon presentation. No evidence of accessory pathways or dual AV nodal physiology. No inducible atrial flutter today, however, clinically the patient has had typical right atrial flutter; therefore Dr. Johney Frame elected to perform cavotricuspid isthmus ablation. Complete cavotricuspid isthmus block was achieved with ablation. No inducible arrhythmias following ablation. No early apparent complications.  History and Hospital Course:  [name] is a 61 year old woman with recently diagnosed atrial flutter, Feb 2013, when she presented to [facility] with tachypalpitations and SOB. She describes heart "fluttering" in the setting of bronchitis/ COPD exacerbation. She was on steroids/albuterol at that time. She continued to have tachypalpitations and therefore had an echo scheduled by Dr Sherril Croon office. During her echocardiogram she was found to have atrial flutter with RVR. She was then referred to [facility] ED. She underwent TEE guided cardioversion and was initiated on anticoagulation. She was placed on amiodarone to prevent recurrence and was referred to Dr. Johney Frame for consideration of ablation. She has multiple risk factors for recurrent atrial arrhythmias including obesity, chronic lung disease, frequent use of steroids/ bronchodilators and  probable sleep apnea (recent sleep study completed). Dr. Johney Frame felt that ablation for atrial flutter was reasonable, particularly because she was very symptomatic and had rates that were difficult to control. He also recommended amiodarone be discontinued following ablation to avoid any long term pulmonary side effects given her underlying lung disease at baseline. She elected to proceed and underwent EP study with 3D mapping and RF ablation of atrial flutter yesterday, 02/22/2012. There were no immediate complications. Following the procedure, Dr. Johney Frame discontinued amiodarone and warfarin. She remains hemodynamically stable and her groin site is intact with no evidence of bleeding or hematoma. She has been ambulating without difficulty. On telemetry, she remains in NSR. She has a migraine HA this AM but is otherwise without complaints. She is eager to go home. She has been seen, examined and deemed stable for discharge by Dr. Lewayne Bunting.  Discharge Vitals: Blood pressure 117/60, pulse 69, temperature 97.4 F (36.3 C), temperature source Oral, resp. rate 20, height 5' 0.5" (1.537 m), weight 262 lb (118.842 kg), SpO2 100.00%.   Labs:  [facility] 02/22/12 0830  INR 1.48   Disposition:  The patient is being discharged in stable condition.  Follow-up: Follow-up Information    Follow up with Hillis Range, MD on 03/25/2012. (At 1:30 PM)    Contact information:   [address], [address] [place] [address] [phone]    Discharge Medications:  Medication List  As of 02/23/2012 10:16 AM   STOP taking these medications     amiodarone 400 MG tablet      warfarin 5 MG tablet       TAKE these medications     ALPRAZolam 0.5 MG tablet   Commonly known as: XANAX   Take 0.5 mg by mouth daily as needed.  For anxiety      budesonide-formoterol 160-4.5 MCG/ACT inhaler   Commonly known as: SYMBICORT   Inhale 2 puffs into the lungs 2 (two) times daily.      celecoxib 200 MG capsule    Commonly known as: CELEBREX   Take 200 mg by mouth every other day.      fluticasone 50 MCG/ACT nasal spray   Commonly known as: FLONASE   Place 2 sprays into the nose daily.      furosemide 20 MG tablet   Commonly known as: LASIX   Take 10 mg by mouth daily as needed. For edema      lisinopril 20 MG tablet   Commonly known as: PRINIVIL,ZESTRIL   Take 30 mg by mouth daily.      omeprazole 40 MG capsule   Commonly known as: PRILOSEC   Take 40 mg by mouth daily with breakfast.      VENTOLIN HFA 108 (90 BASE) MCG/ACT inhaler   Generic drug: albuterol   Inhale 2 puffs into the lungs every 6 (six) hours as needed. For shortness of breath      Duration of Discharge Encounter: Greater than 30 minutes including physician time.  Signed, Rick Duff, PA-C 02/23/2012, 10:16 AM  EP Attending  Patient seen and examined. Agree with the above plan. Ready for discharge today.  Lewayne Bunting, M.D.

## 2012-02-23 NOTE — Progress Notes (Signed)
Pt assessment unchanged from this am. Pt d/c'd to private vehicle via wheelchair with sister

## 2012-02-23 NOTE — Discharge Instructions (Addendum)
Keep procedure site clean & dry. If you notice increased pain, swelling, bleeding or pus, call or return!  You may shower, but no soaking baths/hot tubs/pools for 1 week. No driving for 3 days. No lifting over 5 lbs for 1 week. No sexual activity for 1 week.

## 2012-03-25 ENCOUNTER — Encounter: Payer: 59 | Admitting: Internal Medicine

## 2012-04-08 ENCOUNTER — Other Ambulatory Visit: Payer: Self-pay | Admitting: Internal Medicine

## 2012-04-24 ENCOUNTER — Encounter: Payer: 59 | Admitting: Internal Medicine

## 2012-05-18 ENCOUNTER — Other Ambulatory Visit: Payer: Self-pay | Admitting: Internal Medicine

## 2012-05-20 ENCOUNTER — Encounter: Payer: Self-pay | Admitting: Physician Assistant

## 2012-05-20 ENCOUNTER — Ambulatory Visit (INDEPENDENT_AMBULATORY_CARE_PROVIDER_SITE_OTHER): Payer: 59 | Admitting: Physician Assistant

## 2012-05-20 VITALS — BP 138/64 | HR 82 | Ht 66.0 in | Wt 271.8 lb

## 2012-05-20 DIAGNOSIS — I4892 Unspecified atrial flutter: Secondary | ICD-10-CM

## 2012-05-20 NOTE — Progress Notes (Signed)
[address] [place], [place]  [address] Phone: [phone] Fax:  [phone]  Date:  05/20/2012   Name:  [name]   DOB:  Apr 17, 1951   MRN:  [id number]  PCP:  Ignatius Specking., MD  Primary Cardiologist:  Dr. Lewayne Bunting  Primary Electrophysiologist:  Dr. Hillis Range    History of Present Illness: [name] is a 61 y.o. female who returns for post hospital follow up.  She has a history of atrial flutter, COPD, GERD, HTN. She underwent TEE guided cardioversion in February 2013 at [facility]. She had been maintained on amiodarone. She was evaluated by Dr. Johney Frame 5/13. He recommended proceeding with atrial flutter ablation. He also recommended stopping amiodarone and Coumadin after her ablation. She was admitted 5/30-5/31 for RFCA of atrial flutter. The procedure was without complication.  She is doing well.  Has occasional palpitations but no rapid palpitations as before.  She denies chest pain, syncope, orthopnea, PND, edema.  She has chronic dyspnea from COPD without change.  Past Medical History  Diagnosis Date  . Asthma     spirometry 1989: FEV1 2.12 (71%) with ratio 82.  HFA 90% after coaching 11-09-10  . COPD (chronic obstructive pulmonary disease)   . GERD (gastroesophageal reflux disease)     pso EGD with HH / esophagitis 06-30-93  . Osteoarthritis   . DDD (degenerative disc disease)   . Pelvic fracture   . Hiatal hernia   . Migraine   . Anxiety disorder   . Anemia   . Diverticulosis   . Adenomatous colon polyp   . Atrial flutter     Status post cardioversion 2-12 2013. s/p RFCA 5/13 with JAllred (coumadin and amio stopped)   . Dyspnea     multifactorial, obesity, deconditioning, normal LV function by echocardiogram performed in primary care's office, rule out obstructive sleep apnea  . Hypertension   . Obesity   . PONV (postoperative nausea and vomiting)   . Recurrent upper respiratory infection (URI)   . Pneumonia      hx of pna  . Sleep apnea   . Fibromyalgia   . Anxiety     Current Outpatient Prescriptions  Medication Sig Dispense Refill  . albuterol (VENTOLIN HFA) 108 (90 BASE) MCG/ACT inhaler Inhale 2 puffs into the lungs every 6 (six) hours as needed. For shortness of breath      . ALPRAZolam (XANAX) 0.5 MG tablet Take 0.5 mg by mouth daily as needed. For anxiety      . budesonide-formoterol (SYMBICORT) 160-4.5 MCG/ACT inhaler Inhale 2 puffs into the lungs 2 (two) times daily.      . celecoxib (CELEBREX) 200 MG capsule Take 200 mg by mouth every other day.       . fluticasone (FLONASE) 50 MCG/ACT nasal spray Place 2 sprays into the nose daily.       . furosemide (LASIX) 20 MG tablet Take 10 mg by mouth daily as needed. For edema      . lisinopril (PRINIVIL,ZESTRIL) 20 MG tablet Take 30 mg by mouth daily.      Marland Kitchen omeprazole (PRILOSEC) 40 MG capsule Take 40 mg by mouth daily with breakfast.      . SYMBICORT 160-4.5 MCG/ACT inhaler INHALE 2 PUFFS FIRST THING IN THE MORNING AND AGAIN IN THE EVENING 12 HOURS LATER  10.2 g  0    Allergies: Allergies  Allergen Reactions  . Epinephrine Other (See Comments)    Pallpitations during dental procedures  .  Propoxyphene Hcl Nausea And Vomiting  . Theophyllines     History  Substance Use Topics  . Smoking status: Former Smoker -- 1.0 packs/day for 20 years    Types: Cigarettes    Quit date: 09/25/1984  . Smokeless tobacco: Never Used  . Alcohol Use: No     PHYSICAL EXAM: VS:  BP 138/64  Pulse 82  Ht 5\' 6"  (1.676 m)  Wt 271 lb 12.8 oz (123.288 kg)  BMI 43.87 kg/m2 Well nourished, well developed, in no acute distress HEENT: normal Neck: no JVD Cardiac:  normal S1, S2; RRR; no murmur Lungs:  clear to auscultation bilaterally, no wheezing, rhonchi or rales Abd: soft, nontender, no hepatomegaly Ext: no edema;  bilat groins without bruit Skin: warm and dry Neuro:  CNs 2-12 intact, no focal abnormalities noted  EKG:  Sinus rhythm, heart rate  82, leftward axis, poor R wave progression, no change from prior tracing      ASSESSMENT AND PLAN:  1. Atrial Flutter She is status post RFCA for atrial flutter.  She is off of Coumadin and amiodarone. She is maintaining sinus rhythm. She will continue followup with Dr. Andee Lineman as planned. Followup with Dr. Johney Frame as needed.  Luna Glasgow, PA-C  9:35 AM 05/20/2012

## 2012-05-20 NOTE — Patient Instructions (Addendum)
Your physician recommends that you schedule a follow-up appointment in: WITH DR. Andee Lineman IN THE [facility] OFFICE AS ALREADY SCHEDULED  NO CHANGES WERE MADE TODAY

## 2012-05-28 ENCOUNTER — Encounter: Payer: Self-pay | Admitting: Internal Medicine

## 2012-07-12 ENCOUNTER — Other Ambulatory Visit: Payer: Self-pay | Admitting: Internal Medicine

## 2012-07-16 ENCOUNTER — Ambulatory Visit: Payer: 59 | Admitting: Cardiology

## 2012-07-19 ENCOUNTER — Other Ambulatory Visit: Payer: Self-pay | Admitting: Internal Medicine

## 2012-08-14 ENCOUNTER — Ambulatory Visit: Payer: 59 | Admitting: Internal Medicine

## 2012-08-15 ENCOUNTER — Ambulatory Visit: Payer: 59 | Admitting: Adult Health

## 2012-08-16 ENCOUNTER — Ambulatory Visit: Payer: 59 | Admitting: Internal Medicine

## 2012-08-23 ENCOUNTER — Encounter: Payer: Self-pay | Admitting: Cardiology

## 2012-08-27 ENCOUNTER — Ambulatory Visit: Payer: 59 | Admitting: Cardiology

## 2012-09-13 ENCOUNTER — Ambulatory Visit: Payer: 59 | Admitting: Internal Medicine

## 2012-09-30 ENCOUNTER — Encounter: Payer: Self-pay | Admitting: Pulmonary Disease

## 2012-09-30 ENCOUNTER — Ambulatory Visit (INDEPENDENT_AMBULATORY_CARE_PROVIDER_SITE_OTHER): Payer: 59 | Admitting: Pulmonary Disease

## 2012-09-30 VITALS — BP 132/82 | HR 77 | Temp 97.4°F | Ht 65.0 in | Wt 250.6 lb

## 2012-09-30 DIAGNOSIS — J45901 Unspecified asthma with (acute) exacerbation: Secondary | ICD-10-CM

## 2012-09-30 DIAGNOSIS — J45909 Unspecified asthma, uncomplicated: Secondary | ICD-10-CM

## 2012-09-30 MED ORDER — PREDNISONE 10 MG PO TABS: ORAL_TABLET | ORAL | Status: DC

## 2012-09-30 MED ORDER — CEFUROXIME AXETIL 500 MG PO TABS: 500.0000 mg | ORAL_TABLET | Freq: Two times a day (BID) | ORAL | Status: DC

## 2012-09-30 MED ORDER — LEVALBUTEROL HCL 0.63 MG/3ML IN NEBU
0.6300 mg | INHALATION_SOLUTION | Freq: Once | RESPIRATORY_TRACT | Status: AC
  Administered 2012-09-30: 0.63 mg via RESPIRATORY_TRACT

## 2012-09-30 NOTE — Progress Notes (Signed)
Chief Complaint  Patient presents with  . acute visit    MW pt. c/o deep cough w/ occasional dark yellow phlem, wheezing, chest tx, increase SOB x 3 days. chest hurts form all the coughing and is not able to sleep d/t the cough    History of Present Illness: [name] is a 62 y.o. female former smoker with asthma.  She is followed by Dr. Sherene Sires, but has not seen him since April 2012.  She was seen by Dr. Shelle Iron in January 2013 for an asthma exacerbation.  She has more trouble with her breathing for the past 4 days.  She has cough with chest congestion.  She does not usually bring up sputum.  When she does it is a dark yellow/green.  She denies hemoptysis.  She has temperature of 100F.  She has chest tightness and wheezing.  She did get her flu shot this year.  She was told not to use symbicort when she has an exacerbation.  She has been using ventolin lots over the past several days.   Past Medical History  Diagnosis Date  . Asthma     Spirometry 1989: FEV1 2.12 (71%) with ratio 82.  HFA 90% after coaching 11-09-10  . COPD (chronic obstructive pulmonary disease)   . GERD (gastroesophageal reflux disease)     EGD with HH / esophagitis 06-30-93  . Osteoarthritis   . DDD (degenerative disc disease)   . Pelvic fracture   . Hiatal hernia   . Migraine   . Anxiety disorder   . Anemia   . Diverticulosis   . Adenomatous colon polyp 1993  . Atrial flutter     Status post cardioversion 2-12 2013. s/p RFCA 5/13 with JAllred (coumadin and amio stopped)   . Essential hypertension, benign   . Pneumonia   . Sleep apnea   . Fibromyalgia   . Lipoma of colon   . Internal and external hemorrhoids without complication     Past Surgical History  Procedure Date  . Total knee arthroplasty     x2 09/2006  . Rotator cuff repair     right  . A flutter ablation 02/22/2012  . Dilation and curettage of uterus   . Colonoscopy 05/2010, 07/2000  . Esophagogastroduodenoscopy 05/2010, 06/1993     Outpatient Encounter Prescriptions as of 09/30/2012  Medication Sig Dispense Refill  . albuterol (VENTOLIN HFA) 108 (90 BASE) MCG/ACT inhaler Inhale 2 puffs into the lungs every 6 (six) hours as needed. For shortness of breath      . ALPRAZolam (XANAX) 0.5 MG tablet Take 0.5 mg by mouth daily as needed. For anxiety      . amoxicillin (AMOXIL) 500 MG capsule Take 500 mg by mouth 4 (four) times daily. Prior to dental procedures      . budesonide-formoterol (SYMBICORT) 160-4.5 MCG/ACT inhaler Inhale 2 puffs into the lungs 2 (two) times daily.      . carvedilol (COREG) 12.5 MG tablet Take 12.5 mg by mouth 2 (two) times daily with a meal.      . celecoxib (CELEBREX) 200 MG capsule Take 200 mg by mouth. Every 4 days      . dextromethorphan (DELSYM) 30 MG/5ML liquid Take 60 mg by mouth 2 (two) times daily.      . furosemide (LASIX) 20 MG tablet Take 10 mg by mouth daily as needed. For edema      . lisinopril (PRINIVIL,ZESTRIL) 40 MG tablet Take 40 mg by mouth daily.      Marland Kitchen  omeprazole (PRILOSEC) 40 MG capsule TAKE ONE CAPSULE BY MOUTH ONCE DAILY BEFORE BREAKFAST  30 capsule  0  . [DISCONTINUED] lisinopril (PRINIVIL,ZESTRIL) 20 MG tablet Take 40 mg by mouth daily.       . [DISCONTINUED] methocarbamol (ROBAXIN) 500 MG tablet Take 500 mg by mouth as needed.       . [DISCONTINUED] omeprazole (PRILOSEC) 40 MG capsule Take 40 mg by mouth daily with breakfast.      . [DISCONTINUED] SYMBICORT 160-4.5 MCG/ACT inhaler INHALE 2 PUFFS FIRST THING EVERY MORNING AND AGAIN IN THE EVENING 12 HOURS LATER  1 Inhaler  0    Allergies  Allergen Reactions  . Epinephrine Other (See Comments)    Pallpitations during dental procedures  . Propoxyphene Hcl Nausea And Vomiting  . Theophyllines     Physical Exam:  Filed Vitals:   09/30/12 1517 09/30/12 1520  BP:  132/82  Pulse:  77  Temp: 97.4 F (36.3 C)   TempSrc: Oral   Height: 5\' 5"  (1.651 m)   Weight: 250 lb 9.6 oz (113.671 kg)   SpO2:  97%     Current  Encounter SPO2  09/30/12 1520 97%  02/23/12 1022 100%  02/23/12 0553 100%  02/22/12 2110 96%  02/22/12 1445 100%  02/22/12 1435 100%  02/22/12 0816 97%  02/22/12 1304 100%     Body mass index is 41.70 kg/(m^2).   Wt Readings from Last 2 Encounters:  09/30/12 250 lb 9.6 oz (113.671 kg)  05/20/12 271 lb 12.8 oz (123.288 kg)     General - Speaks in full sentences ENT - Maxillary sinus tenderness b/l, no oral exudate, no LAN, TM clear Cardiac - s1s2 regular, no murmur Chest - Decreased breath sounds, coarse breath sounds b/l with expiratory wheeze >> improved after xopenex treatment Back - No focal tenderness Abd - Soft, non-tender Ext - No edema Neuro - Normal strength Skin - No rashes Psych - normal mood, and behavior   Assessment/Plan:  [name], MD [facility] Pager:  [phone] 09/30/2012, 3:32 PM

## 2012-09-30 NOTE — Assessment & Plan Note (Signed)
She has acute asthma flare with sinus disease.  She improved some after nebulizer therapy.  Her symptoms are not suggestive of influenza.  She does not have evidence to suggest pneumonia.  Will give her course of ceftin and prednisone.  She is to continue symbicort and albuterol.  I have advised her to d/w Dr. Sherene Sires whether she would benefit from a home nebulizer.

## 2012-09-30 NOTE — Patient Instructions (Signed)
Ceftin 500 mg pill >> one pill twice per day for 1 week Prednisone 10 mg pill >> 4 pills for 2 days, 3 pills for 2 days, 2 pills for 2 days, 1 pill for 2 days Follow up with Dr. Sherene Sires in one week as scheduled

## 2012-10-09 ENCOUNTER — Ambulatory Visit (INDEPENDENT_AMBULATORY_CARE_PROVIDER_SITE_OTHER): Payer: 59 | Admitting: Internal Medicine

## 2012-10-09 ENCOUNTER — Telehealth: Payer: Self-pay | Admitting: Internal Medicine

## 2012-10-09 ENCOUNTER — Encounter: Payer: Self-pay | Admitting: Internal Medicine

## 2012-10-09 VITALS — BP 132/84 | HR 70 | Temp 97.8°F | Ht 65.5 in | Wt 236.0 lb

## 2012-10-09 DIAGNOSIS — J45909 Unspecified asthma, uncomplicated: Secondary | ICD-10-CM

## 2012-10-09 DIAGNOSIS — I10 Essential (primary) hypertension: Secondary | ICD-10-CM

## 2012-10-09 MED ORDER — NEBIVOLOL HCL 10 MG PO TABS: 10.0000 mg | ORAL_TABLET | Freq: Every day | ORAL | Status: DC

## 2012-10-09 MED ORDER — PREDNISONE 10 MG PO TABS: ORAL_TABLET | ORAL | Status: DC

## 2012-10-09 MED ORDER — TRAMADOL HCL 50 MG PO TABS: ORAL_TABLET | ORAL | Status: DC

## 2012-10-09 MED ORDER — MOMETASONE FURO-FORMOTEROL FUM 100-5 MCG/ACT IN AERO: INHALATION_SPRAY | RESPIRATORY_TRACT | Status: DC

## 2012-10-09 MED ORDER — OLMESARTAN MEDOXOMIL 40 MG PO TABS: 40.0000 mg | ORAL_TABLET | Freq: Every day | ORAL | Status: DC

## 2012-10-09 NOTE — Progress Notes (Signed)
Subjective:    Patient ID: [name], female    DOB: November 15, 1950, 62 y.o.   MRN: [id number]  HPI  18 yowf quit smoking in 1986 with history of Asthma and GERD.    July 19, 2009--Presents for an acute office visit.x 2d. tripped in bathroom, fell against toilet seat, hitting right ribs on toilet seat. Pain w/ inspiration and very tender to touch along right lateral ribs. No skinbreak. No bruising noted. Has had recent bronchitis tx w/ doxcycline/pred last week, better w/ decreased cough adn congestion. No discolored mucus or fever. .Cont on celebrex  Warm heat to ribs.  Vicodin 1-2 every 4-6 hrs as needed pain.   November 09, 2010 acute ov Cough and wheezing x 2 months despite symbicort 80 on multiple oil based vitamins and ppi daily but not ac assoc with nasal congestion but no purulent secretions at this time.  1) Symbicort 160 2 puffs first thing in am and 2 puffs again in pm about 12 hours later  2) Ventolin is only to be used if needed  3) Prednsione 10 mg 4 each am x 2days, 2x2days, 1x2days and stop  4) Omeprazole 40 mg Take one 30-60 min before first meal of the day also take Pepcid 20 mg one at bedtime as long as flare in resp symptoms  5) use flutter whenever coughing  6) Mucinex dm 2 every 12 hours if cough  7) GERD (REFLUX) diet  12/28/2010 ov cc cough and breathing much better on rx.  Pt denies any significant sore throat, dysphagia, itching, sneezing,  nasal congestion or excess/ purulent secretions,  fever, chills, sweats, unintended wt loss, pleuritic or exertional cp, hempoptysis, orthopnea pnd or leg swelling.    Also denies any obvious fluctuation of symptoms with weather or environmental changes or other aggravating or alleviating factors.  No noct co's or need for rescue   12/28/2010 ov much better on symbicort, still pnds rec Ok to try symbicort 160 1-2 every 12 hours For drainage try chlortrimeton 4mg  one every 6 hours  Seen by Dr Craige Cotta 09/30/12 for AB exac >  rx pred   10/09/2012 f/u ov/Dimple Bastyr cc doing ok until xmas with exposure to sick people while on acei and coreg with persistent  flare of ab since and sob some better p prednisone but still severe cough with min thick yellow mucus. Some better p albuterol.  Sleeping ok without nocturnal  or early am exacerbation  of respiratory  c/o's or need for noct saba. Also denies any obvious fluctuation of symptoms with weather or environmental changes or other aggravating or alleviating factors except as outlined above   ROS  The following are not active complaints unless bolded sore throat, dysphagia, dental problems, itching, sneezing,  nasal congestion or excess/ purulent secretions, ear ache,   fever, chills, sweats, unintended wt loss, pleuritic or exertional cp, hemoptysis,  orthopnea pnd or leg swelling, presyncope, palpitations, heartburn, abdominal pain, anorexia, nausea, vomiting, diarrhea  or change in bowel or urinary habits, change in stools or urine, dysuria,hematuria,  rash, arthralgias, visual complaints, headache, numbness weakness or ataxia or problems with walking or coordination,  change in mood/affect or memory.                   Allergies 1) ! Normand Sloop  2) ! Epinephrine  3) ! 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  NECK: Supple w/ fair ROM; no JVD; normal carotid impulses w/o bruits; no thyromegaly or nodules palpated; no lymphadenopathy.  CHEST: junky sonorous pan exp rhonchi bilaterally  HEART: RRR, no m/r/g heard  ABDOMEN: Soft & nt; nml bowel sounds; no organomegaly or masses detected.  EXT: Warm bilat, no calf pain, edema, clubbing, pulses intact    Assessment & Plan:

## 2012-10-09 NOTE — Patient Instructions (Addendum)
Stop coreg and  Lisinopril and symbicort   Dulera 100 Take 2 puffs first thing in am and then another 2 puffs about 12 hours later.   Start Bystolic 10 mg one daily   Start benicar 40 mg one daily ok to break in half if light headed standing   Take mucinex dm 1-2   every 12 hours and supplement if needed with  tramadol 50 mg up to 2 every 4 hours to suppress the urge to cough. Swallowing water or using ice chips/non mint and menthol containing candies (such as lifesavers or sugarless jolly ranchers) are also effective.  You should rest your voice and avoid activities that you know make you cough.  Once you have eliminated the cough for 3 straight days try reducing the tramadol first,  then the mucinex   Prednisone 10 mg take  4 each am x 2 days,   2 each am x 2 days,  1 each am x2days and stop   Please schedule a follow up office visit in 2 weeks, sooner if needed to see Tammy with your formulary

## 2012-10-09 NOTE — Telephone Encounter (Signed)
Per MW note   Prednisone 10 mg take 4 each am x 2 days, 2 each am x 2 days, 1 each am x2days and stop    rx has been sent to the pts pharmacy per pts request and i have called and lmom to make her aware.

## 2012-10-09 NOTE — Assessment & Plan Note (Signed)
Changed from coreg to bystolic 10/09/2012 due to wheeze Changed from acei to arb 10/09/2012 due to cough  ACE inhibitors are problematic in  pts with airway complaints because  even experienced pulmonologists can't always distinguish ace effects from copd/asthma.  By themselves they don't actually cause a problem, much like oxygen can't by itself start a fire, but they certainly serve as a powerful catalyst or enhancer for any "fire"  or inflammatory process in the upper airway, be it caused by an ET  tube or more commonly reflux (especially in the obese or pts with known GERD or who are on biphoshonates).    In the era of ARB near equivalency until we have a better handle on the reversibility of the airway problem, it just makes sense to avoid ACEI  entirely in the short run and then decide later, having established a level of airway control using a reasonable limited regimen, whether to add back ace but even then being very careful to observe the pt for worsening airway control and number of meds used/ needed to control symptoms.

## 2012-10-09 NOTE — Assessment & Plan Note (Signed)
-   Spirometry 1989: FEV1 2.12 ( 71%) with ratio 82  - HFA 100% 10/09/2012  -PFT's nl 12/28/2010 x ERV 22%  DDX of  difficult airways managment all start with A and  include Adherence, Ace Inhibitors, Acid Reflux, Active Sinus Disease, Alpha 1 Antitripsin deficiency, Anxiety masquerading as Airways dz,  ABPA,  allergy(esp in young), Aspiration (esp in elderly), Adverse effects of DPI,  Active smokers, plus two Bs  = Bronchiectasis and Beta blocker use..and one C= CHF   Adherence is always the initial "prime suspect" and is a multilayered concern that requires a "trust but verify" approach in every patient - starting with knowing how to use medications, especially inhalers, correctly, keeping up with refills and understanding the fundamental difference between maintenance and prns vs those medications only taken for a very short course and then stopped and not refilled. The proper method of use, as well as anticipated side effects, of a metered-dose inhaler are discussed and demonstrated to the patient. Improved effectiveness after extensive coaching during this visit to a level of approximately  100%  ? Acid reflux > continue rx  ACEI > d/c discussed separately  ? Beta blocker > Strongly prefer in this setting: Bystolic, the most beta -1  selective Beta blocker available in sample form, with bisoprolol the most selective generic choice  on the market.

## 2012-10-10 ENCOUNTER — Ambulatory Visit (INDEPENDENT_AMBULATORY_CARE_PROVIDER_SITE_OTHER): Payer: 59 | Admitting: Internal Medicine

## 2012-10-10 ENCOUNTER — Encounter: Payer: Self-pay | Admitting: Internal Medicine

## 2012-10-10 VITALS — BP 162/76 | HR 84 | Ht 65.5 in | Wt 244.0 lb

## 2012-10-10 DIAGNOSIS — K219 Gastro-esophageal reflux disease without esophagitis: Secondary | ICD-10-CM

## 2012-10-10 MED ORDER — OMEPRAZOLE 40 MG PO CPDR: 40.0000 mg | DELAYED_RELEASE_CAPSULE | Freq: Every day | ORAL | Status: DC

## 2012-10-10 NOTE — Patient Instructions (Addendum)
We have sent the following medications to your pharmacy for you to pick up at your convenience: Prilosec  Follow up with [place] in 2 years or sooner if needed.  Thank you for choosing me and [facility].  Iva Boop, M.D., [facility]

## 2012-10-10 NOTE — Progress Notes (Signed)
  Subjective:    Patient ID: [name], female    DOB: 1951-06-08, 62 y.o.   MRN: [id number]  HPI She returns in follow-up for GERD. As long as she takes her omeprazole 40 mg daily and watches diet, GERD is under control. Has a URI with reactive airway flare. Just had BP med changed , lisinopril stopped by Dr. Sherene Sires in hopes of reducing a cough problem.  Medications, allergies, past medical history, past surgical history, family history and social history are reviewed and updated in the EMR.  Review of Systems     Objective:   Physical Exam Obese ww NAD but does have a cough       Assessment & Plan:   1. GERD (gastroesophageal reflux disease)    1. Continue omeprazole 40 mg daily (refilled x 1 year) 2. May refill up to 2 years w/o visit if stable 3. We discussed need for weight loss 4. See me prn sooner if problems  IO:NGEX,BMWUX B., MD

## 2012-10-18 ENCOUNTER — Ambulatory Visit: Payer: 59 | Admitting: Cardiology

## 2012-10-21 ENCOUNTER — Encounter: Payer: 59 | Admitting: Adult Health

## 2012-10-30 ENCOUNTER — Ambulatory Visit (INDEPENDENT_AMBULATORY_CARE_PROVIDER_SITE_OTHER): Payer: 59 | Admitting: Adult Health

## 2012-10-30 ENCOUNTER — Encounter: Payer: Self-pay | Admitting: Adult Health

## 2012-10-30 VITALS — BP 150/78 | HR 77 | Temp 98.4°F | Ht 65.5 in | Wt 241.2 lb

## 2012-10-30 DIAGNOSIS — I10 Essential (primary) hypertension: Secondary | ICD-10-CM

## 2012-10-30 DIAGNOSIS — J019 Acute sinusitis, unspecified: Secondary | ICD-10-CM | POA: Insufficient documentation

## 2012-10-30 DIAGNOSIS — J45909 Unspecified asthma, uncomplicated: Secondary | ICD-10-CM

## 2012-10-30 MED ORDER — CEFDINIR 300 MG PO CAPS: 300.0000 mg | ORAL_CAPSULE | Freq: Two times a day (BID) | ORAL | Status: DC

## 2012-10-30 MED ORDER — BISOPROLOL FUMARATE 10 MG PO TABS: 10.0000 mg | ORAL_TABLET | Freq: Every day | ORAL | Status: DC

## 2012-10-30 MED ORDER — MOMETASONE FURO-FORMOTEROL FUM 100-5 MCG/ACT IN AERO: 2.0000 | INHALATION_SPRAY | Freq: Two times a day (BID) | RESPIRATORY_TRACT | Status: DC

## 2012-10-30 MED ORDER — LOSARTAN POTASSIUM 100 MG PO TABS: 100.0000 mg | ORAL_TABLET | Freq: Every day | ORAL | Status: DC

## 2012-10-30 NOTE — Assessment & Plan Note (Signed)
B/p rx changes due to insurance formulary coverage.  Pt to follow up with PCP in 4 weeks as planned for b/p and refills on rx.  Low salt diet  Check b/p 3 x week - call if >150

## 2012-10-30 NOTE — Progress Notes (Signed)
Subjective:    Patient ID: [name], female    DOB: 1951-06-12, 62 y.o.   MRN: [id number]  HPI  39 yowf quit smoking in 1986 with history of Asthma and GERD.    July 19, 2009--Presents for an acute office visit.x 2d. tripped in bathroom, fell against toilet seat, hitting right ribs on toilet seat. Pain w/ inspiration and very tender to touch along right lateral ribs. No skinbreak. No bruising noted. Has had recent bronchitis tx w/ doxcycline/pred last week, better w/ decreased cough adn congestion. No discolored mucus or fever. .Cont on celebrex  Warm heat to ribs.  Vicodin 1-2 every 4-6 hrs as needed pain.   November 09, 2010 acute ov Cough and wheezing x 2 months despite symbicort 80 on multiple oil based vitamins and ppi daily but not ac assoc with nasal congestion but no purulent secretions at this time.  1) Symbicort 160 2 puffs first thing in am and 2 puffs again in pm about 12 hours later  2) Ventolin is only to be used if needed  3) Prednsione 10 mg 4 each am x 2days, 2x2days, 1x2days and stop  4) Omeprazole 40 mg Take one 30-60 min before first meal of the day also take Pepcid 20 mg one at bedtime as long as flare in resp symptoms  5) use flutter whenever coughing  6) Mucinex dm 2 every 12 hours if cough  7) GERD (REFLUX) diet  12/28/2010 ov cc cough and breathing much better on rx.  Pt denies any significant sore throat, dysphagia, itching, sneezing,  nasal congestion or excess/ purulent secretions,  fever, chills, sweats, unintended wt loss, pleuritic or exertional cp, hempoptysis, orthopnea pnd or leg swelling.    Also denies any obvious fluctuation of symptoms with weather or environmental changes or other aggravating or alleviating factors.  No noct co's or need for rescue   12/28/2010 ov much better on symbicort, still pnds rec Ok to try symbicort 160 1-2 every 12 hours For drainage try chlortrimeton 4mg  one every 6 hours  Seen by Dr Craige Cotta 09/30/12 for AB exac >  rx pred   10/09/2012 f/u ov/Wert cc doing ok until xmas with exposure to sick people while on acei and coreg with persistent  flare of ab since and sob some better p prednisone but still severe cough with min thick yellow mucus. Some better p albuterol. >changed coreg to bystolic , symbicort to dulera  And d/c ACE to ARB   10/30/2012 Follow up  Patient returns for a two-week followup and medication review. Patient was seen last visit with persistent cough and wheezing. Patient was changed off of her ACE inhibitor her beta blocker Coreg was changed over to [employer]. The patient was changed from Symbicort to delay or a. Patient returns today reporting that she is much improved. Cough and wheezing are decreased essentially. She feels that she can take in a deep breath and has poor airflow. She does complain that she has sinus congestion, drainage, and sinus pain, and pressure with a sinus headache. Patient denies any fever or hemoptysis, orthopnea, PND, or leg swelling. Patient has brought her drug formulary in today for several her medications are on tier 3 or 4  medications. She requests meds be changed to tier 1 or 2 to have more for affordability .    Allergies 1) ! Normand Sloop  2) ! Epinephrine  3) ! 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  HEENT:   No headaches,  Difficulty swallowing,  Tooth/dental problems, or  Sore throat,                No sneezing, itching, ear ache,  +nasal congestion, post nasal drip,   CV:  No chest pain,  Orthopnea, PND, swelling in lower extremities, anasarca, dizziness, palpitations, syncope.   GI  No heartburn, indigestion, abdominal pain, nausea, vomiting, diarrhea, change in bowel habits, loss of appetite, bloody stools.   Resp:  .  No excess mucus, no productive cough,  No non-productive cough,  No coughing up of blood.  No change in color of mucus.  No wheezing.  No chest wall deformity  Skin: no rash or lesions.  GU: no dysuria, change in color of urine, no urgency or frequency.  No flank pain, no hematuria   MS:     No back pain.  Psych:  No change in mood or affect. No depression or anxiety.  No memory loss.            Objective:   Physical Exam    GENERAL: A/Ox3; pleasant & cooperative.NAD  With harsh barking upper airway cough wt 256 > 266 November 09, 2010  > 262 12/28/2010 > 236 10/09/2012 >241 10/30/2012  HEENT: [place]/AT, EOM-wnl, PERRLA, EACs-clear, TMs-wnl, NOSE pale mucosa, THROAT-clear & wnl.  NECK: Supple w/ fair ROM; no JVD; normal carotid impulses w/o bruits; no thyromegaly or nodules palpated; no lymphadenopathy.  CHEST CTA bialterally , no wheezing  HEART: RRR, no m/r/g heard  ABDOMEN: Soft & nt; nml bowel sounds; no organomegaly or masses detected.  EXT: Warm bilat, no calf pain, edema, clubbing, pulses intact, arthritic changes in hands.     Assessment & Plan:

## 2012-10-30 NOTE — Patient Instructions (Addendum)
Begin Losartan 100mg  daily (In place of Benicar)  Avoid ACE Inhibitors in future due to cough .  Begin Bisoprolol 10mg  daily ( in place Bystolic )  Continue on Dulera 2 puffs Twice daily  -brush/rinse /gargle after inhaler use.  Omnicef 300mg  Twice daily  For 10 days  Mucinex Twice daily  As needed  Congestion.  Fluids and rest.  Saline nasal rinses As needed   Please contact office for sooner follow up if symptoms do not improve or worsen or seek emergency care  follow up Dr. Sherene Sires  In 6 weeks.

## 2012-10-30 NOTE — Assessment & Plan Note (Signed)
Recent flare with upper airway cough, secondary to ACE inhibitor and nonselective beta blocker. Patient is much improved off of ACE inhibitor. Patient's been advised to avoid ACE inhibitor in the  future if possible. Improved control on [facility] w/ better formulary coverage.  Does appear to have sinusitis today - will tx w/ abx  Plan  Begin Losartan 100mg  daily (In place of Benicar)  Avoid ACE Inhibitors in future due to cough .  Begin Bisoprolol 10mg  daily ( in place Bystolic )  Continue on Dulera 2 puffs Twice daily  -brush/rinse /gargle after inhaler use.  Omnicef 300mg  Twice daily  For 10 days  Mucinex Twice daily  As needed  Congestion.  Fluids and rest.  Saline nasal rinses As needed   Please contact office for sooner follow up if symptoms do not improve or worsen or seek emergency care  follow up Dr. Sherene Sires  In 6 weeks.

## 2012-10-30 NOTE — Assessment & Plan Note (Signed)
Acute sinusitis.  Plan Begin Omnicef x10 days. Mucinex twice daily as needed. For congestion. Saline nasal rinses as needed. Followup in 6 weeks and as needed.

## 2012-12-13 ENCOUNTER — Ambulatory Visit: Payer: 59 | Admitting: Internal Medicine

## 2013-01-08 ENCOUNTER — Ambulatory Visit: Payer: 59 | Admitting: Internal Medicine

## 2013-01-23 ENCOUNTER — Ambulatory Visit: Payer: 59 | Admitting: Internal Medicine

## 2013-02-07 ENCOUNTER — Ambulatory Visit: Payer: 59 | Admitting: Internal Medicine

## 2013-03-05 ENCOUNTER — Ambulatory Visit: Payer: 59 | Admitting: Internal Medicine

## 2013-03-12 ENCOUNTER — Ambulatory Visit: Payer: 59 | Admitting: Internal Medicine

## 2013-04-15 ENCOUNTER — Ambulatory Visit: Payer: 59 | Admitting: Internal Medicine

## 2013-04-22 ENCOUNTER — Ambulatory Visit: Payer: 59 | Admitting: Internal Medicine

## 2013-04-29 ENCOUNTER — Ambulatory Visit: Payer: 59 | Admitting: Internal Medicine

## 2013-05-23 ENCOUNTER — Ambulatory Visit: Payer: 59 | Admitting: Internal Medicine

## 2013-09-26 ENCOUNTER — Encounter: Payer: Self-pay | Admitting: Internal Medicine

## 2013-09-26 DIAGNOSIS — Z0000 Encounter for general adult medical examination without abnormal findings: Secondary | ICD-10-CM | POA: Insufficient documentation

## 2013-10-20 ENCOUNTER — Encounter: Payer: Self-pay | Admitting: Internal Medicine

## 2013-10-29 ENCOUNTER — Other Ambulatory Visit: Payer: Self-pay | Admitting: Internal Medicine

## 2013-10-30 ENCOUNTER — Telehealth: Payer: Self-pay | Admitting: Internal Medicine

## 2013-10-30 NOTE — Telephone Encounter (Signed)
2 boxes dulera for p/u  No rescue inhaler available  LMOM for the pt to let her know

## 2013-12-04 ENCOUNTER — Ambulatory Visit: Payer: 59 | Admitting: Internal Medicine

## 2014-01-21 ENCOUNTER — Ambulatory Visit: Payer: 59 | Admitting: Internal Medicine

## 2014-02-12 ENCOUNTER — Ambulatory Visit: Payer: 59 | Admitting: Internal Medicine

## 2014-02-19 ENCOUNTER — Encounter (HOSPITAL_COMMUNITY): Payer: Medicare Other | Admitting: Certified Registered"

## 2014-02-19 ENCOUNTER — Encounter (HOSPITAL_COMMUNITY): Payer: Self-pay | Admitting: Internal Medicine

## 2014-02-19 ENCOUNTER — Inpatient Hospital Stay (HOSPITAL_COMMUNITY)
Admission: AD | Admit: 2014-02-19 | Discharge: 2014-02-20 | DRG: 309 | Disposition: A | Discharge status: Home / Self Care | Payer: Medicare Other | Source: Ambulatory Visit | Attending: Cardiology | Admitting: Cardiology

## 2014-02-19 ENCOUNTER — Inpatient Hospital Stay (HOSPITAL_COMMUNITY): Payer: Medicare Other

## 2014-02-19 ENCOUNTER — Encounter (HOSPITAL_COMMUNITY)
Admission: AD | Disposition: A | Discharge status: Home / Self Care | Payer: Self-pay | Source: Ambulatory Visit | Attending: Cardiology

## 2014-02-19 ENCOUNTER — Inpatient Hospital Stay (HOSPITAL_COMMUNITY): Payer: Medicare Other | Admitting: Certified Registered"

## 2014-02-19 DIAGNOSIS — I4891 Unspecified atrial fibrillation: Principal | ICD-10-CM | POA: Diagnosis present

## 2014-02-19 DIAGNOSIS — G4733 Obstructive sleep apnea (adult) (pediatric): Secondary | ICD-10-CM | POA: Diagnosis present

## 2014-02-19 DIAGNOSIS — Z836 Family history of other diseases of the respiratory system: Secondary | ICD-10-CM

## 2014-02-19 DIAGNOSIS — Z860100 Personal history of colon polyps, unspecified: Secondary | ICD-10-CM

## 2014-02-19 DIAGNOSIS — J449 Chronic obstructive pulmonary disease, unspecified: Secondary | ICD-10-CM

## 2014-02-19 DIAGNOSIS — Z7901 Long term (current) use of anticoagulants: Secondary | ICD-10-CM

## 2014-02-19 DIAGNOSIS — J4489 Other specified chronic obstructive pulmonary disease: Secondary | ICD-10-CM

## 2014-02-19 DIAGNOSIS — Z87891 Personal history of nicotine dependence: Secondary | ICD-10-CM

## 2014-02-19 DIAGNOSIS — J45901 Unspecified asthma with (acute) exacerbation: Secondary | ICD-10-CM

## 2014-02-19 DIAGNOSIS — F410 Panic disorder [episodic paroxysmal anxiety] without agoraphobia: Secondary | ICD-10-CM | POA: Diagnosis present

## 2014-02-19 DIAGNOSIS — Z96659 Presence of unspecified artificial knee joint: Secondary | ICD-10-CM

## 2014-02-19 DIAGNOSIS — Z803 Family history of malignant neoplasm of breast: Secondary | ICD-10-CM

## 2014-02-19 DIAGNOSIS — E119 Type 2 diabetes mellitus without complications: Secondary | ICD-10-CM | POA: Diagnosis not present

## 2014-02-19 DIAGNOSIS — Z79899 Other long term (current) drug therapy: Secondary | ICD-10-CM

## 2014-02-19 DIAGNOSIS — I10 Essential (primary) hypertension: Secondary | ICD-10-CM | POA: Diagnosis present

## 2014-02-19 DIAGNOSIS — R079 Chest pain, unspecified: Secondary | ICD-10-CM

## 2014-02-19 DIAGNOSIS — Z6841 Body Mass Index (BMI) 40.0 and over, adult: Secondary | ICD-10-CM

## 2014-02-19 DIAGNOSIS — K5730 Diverticulosis of large intestine without perforation or abscess without bleeding: Secondary | ICD-10-CM | POA: Diagnosis present

## 2014-02-19 DIAGNOSIS — K219 Gastro-esophageal reflux disease without esophagitis: Secondary | ICD-10-CM | POA: Diagnosis present

## 2014-02-19 DIAGNOSIS — Z888 Allergy status to other drugs, medicaments and biological substances status: Secondary | ICD-10-CM

## 2014-02-19 DIAGNOSIS — Z8249 Family history of ischemic heart disease and other diseases of the circulatory system: Secondary | ICD-10-CM

## 2014-02-19 DIAGNOSIS — J441 Chronic obstructive pulmonary disease with (acute) exacerbation: Secondary | ICD-10-CM | POA: Diagnosis present

## 2014-02-19 HISTORY — PX: CARDIOVERSION: SHX1299

## 2014-02-19 LAB — MAGNESIUM: MAGNESIUM: 2 mg/dL (ref 1.5–2.5)

## 2014-02-19 LAB — TROPONIN I
Troponin I: <0.3 ng/mL (ref <0.30)
Troponin I: <0.3 ng/mL (ref <0.30)
Troponin I: <0.3 ng/mL (ref <0.30)

## 2014-02-19 LAB — TSH: TSH: 2.21 u[IU]/mL (ref 0.350–4.500)

## 2014-02-19 LAB — CBC
HCT: 31.1 % — ABNORMAL LOW (ref 36.0–46.0)
Hemoglobin: 9.4 g/dL — ABNORMAL LOW (ref 12.0–15.0)
MCH: 27.5 pg (ref 26.0–34.0)
MCHC: 30.2 g/dL (ref 30.0–36.0)
MCV: 90.9 fL (ref 78.0–100.0)
PLATELETS: 314 10*3/uL (ref 150–400)
RBC: 3.42 MIL/uL — AB (ref 3.87–5.11)
RDW: 16.4 % — ABNORMAL HIGH (ref 11.5–15.5)
WBC: 7.1 10*3/uL (ref 4.0–10.5)

## 2014-02-19 LAB — BASIC METABOLIC PANEL
BUN: 13 mg/dL (ref 6–23)
CHLORIDE: 104 meq/L (ref 96–112)
CO2: 26 meq/L (ref 19–32)
Calcium: 9 mg/dL (ref 8.4–10.5)
Creatinine, Ser: 0.9 mg/dL (ref 0.50–1.10)
GFR calc Af Amer: 76 mL/min — ABNORMAL LOW (ref >90)
GFR calc non Af Amer: 66 mL/min — ABNORMAL LOW (ref >90)
Glucose, Bld: 149 mg/dL — ABNORMAL HIGH (ref 70–99)
Potassium: 3.9 mEq/L (ref 3.7–5.3)
Sodium: 141 mEq/L (ref 137–147)

## 2014-02-19 LAB — MRSA PCR SCREENING: MRSA BY PCR: NEGATIVE

## 2014-02-19 LAB — HEMOGLOBIN A1C
Hgb A1c MFr Bld: 6.5 % — ABNORMAL HIGH (ref <5.7)
MEAN PLASMA GLUCOSE: 140 mg/dL — AB (ref <117)

## 2014-02-19 LAB — PROTIME-INR
INR: 1.09 (ref 0.00–1.49)
Prothrombin Time: 13.9 seconds (ref 11.6–15.2)

## 2014-02-19 SURGERY — CARDIOVERSION
Anesthesia: General

## 2014-02-19 MED ORDER — SODIUM CHLORIDE 0.9 % IV SOLN: INTRAVENOUS | Status: DC | PRN

## 2014-02-19 MED ORDER — METOPROLOL TARTRATE 1 MG/ML IV SOLN
5.0000 mg | Freq: Four times a day (QID) | INTRAVENOUS | Status: AC
  Administered 2014-02-19 (×3): 5 mg via INTRAVENOUS
  Filled 2014-02-19 (×4): qty 5

## 2014-02-19 MED ORDER — IPRATROPIUM BROMIDE 0.02 % IN SOLN
0.5000 mg | RESPIRATORY_TRACT | Status: DC
  Administered 2014-02-19: 0.5 mg via RESPIRATORY_TRACT
  Filled 2014-02-19: qty 2.5

## 2014-02-19 MED ORDER — SODIUM CHLORIDE 0.9 % IV SOLN
INTRAVENOUS | Status: DC | PRN
  Administered 2014-02-19: 17:00:00 via INTRAVENOUS

## 2014-02-19 MED ORDER — SODIUM CHLORIDE 0.9 % IJ SOLN
3.0000 mL | Freq: Two times a day (BID) | INTRAMUSCULAR | Status: DC
Start: 2014-02-19 — End: 2014-02-19
  Administered 2014-02-19 (×2): 3 mL via INTRAVENOUS

## 2014-02-19 MED ORDER — LIDOCAINE HCL (CARDIAC) 20 MG/ML IV SOLN
INTRAVENOUS | Status: DC | PRN
  Administered 2014-02-19: 80 mg via INTRAVENOUS

## 2014-02-19 MED ORDER — SODIUM CHLORIDE 0.9 % IJ SOLN
3.0000 mL | Freq: Two times a day (BID) | INTRAMUSCULAR | Status: DC
  Administered 2014-02-19: 3 mL via INTRAVENOUS

## 2014-02-19 MED ORDER — FUROSEMIDE 20 MG PO TABS
10.0000 mg | ORAL_TABLET | Freq: Every day | ORAL | Status: DC
  Administered 2014-02-19 – 2014-02-20 (×2): 10 mg via ORAL
  Filled 2014-02-19 (×2): qty 0.5

## 2014-02-19 MED ORDER — DILTIAZEM HCL 100 MG IV SOLR
5.0000 mg/h | INTRAVENOUS | Status: DC
  Administered 2014-02-19 (×3): 15 mg/h via INTRAVENOUS
  Administered 2014-02-20: 10 mg/h via INTRAVENOUS
  Filled 2014-02-19 (×2): qty 100

## 2014-02-19 MED ORDER — ALPRAZOLAM 0.5 MG PO TABS
0.5000 mg | ORAL_TABLET | Freq: Three times a day (TID) | ORAL | Status: DC | PRN
  Administered 2014-02-19 (×2): 0.5 mg via ORAL
  Filled 2014-02-19 (×2): qty 1

## 2014-02-19 MED ORDER — PROPOFOL 10 MG/ML IV BOLUS
INTRAVENOUS | Status: DC | PRN
  Administered 2014-02-19: 80 mg via INTRAVENOUS

## 2014-02-19 MED ORDER — METOPROLOL TARTRATE 1 MG/ML IV SOLN: 5.0000 mg | Freq: Four times a day (QID) | INTRAVENOUS | Status: DC

## 2014-02-19 MED ORDER — MOMETASONE FURO-FORMOTEROL FUM 100-5 MCG/ACT IN AERO
2.0000 | INHALATION_SPRAY | Freq: Two times a day (BID) | RESPIRATORY_TRACT | Status: DC
  Administered 2014-02-19: 2 via RESPIRATORY_TRACT
  Filled 2014-02-19: qty 8.8

## 2014-02-19 MED ORDER — ACETAMINOPHEN 325 MG PO TABS
650.0000 mg | ORAL_TABLET | Freq: Four times a day (QID) | ORAL | Status: DC | PRN
  Administered 2014-02-19: 650 mg via ORAL
  Filled 2014-02-19: qty 2

## 2014-02-19 MED ORDER — METHYLPREDNISOLONE SODIUM SUCC 125 MG IJ SOLR
60.0000 mg | Freq: Two times a day (BID) | INTRAMUSCULAR | Status: DC
  Administered 2014-02-19 (×2): 60 mg via INTRAVENOUS
  Filled 2014-02-19: qty 0.96
  Filled 2014-02-19: qty 2
  Filled 2014-02-19: qty 0.96

## 2014-02-19 MED ORDER — SODIUM CHLORIDE 0.9 % IJ SOLN: 3.0000 mL | INTRAMUSCULAR | Status: DC | PRN

## 2014-02-19 MED ORDER — DM-GUAIFENESIN ER 30-600 MG PO TB12
1.0000 | ORAL_TABLET | Freq: Two times a day (BID) | ORAL | Status: DC
  Administered 2014-02-19 – 2014-02-20 (×4): 1 via ORAL
  Filled 2014-02-19 (×5): qty 1

## 2014-02-19 MED ORDER — SODIUM CHLORIDE 0.9 % IV SOLN: 250.0000 mL | INTRAVENOUS | Status: DC | PRN

## 2014-02-19 MED ORDER — LOSARTAN POTASSIUM 50 MG PO TABS
100.0000 mg | ORAL_TABLET | Freq: Every day | ORAL | Status: DC
  Administered 2014-02-20: 100 mg via ORAL
  Filled 2014-02-19 (×2): qty 2

## 2014-02-19 MED ORDER — PANTOPRAZOLE SODIUM 40 MG PO TBEC
40.0000 mg | DELAYED_RELEASE_TABLET | Freq: Every day | ORAL | Status: DC
  Administered 2014-02-19 – 2014-02-20 (×2): 40 mg via ORAL
  Filled 2014-02-19 (×2): qty 1

## 2014-02-19 MED ORDER — ENOXAPARIN SODIUM 120 MG/0.8ML ~~LOC~~ SOLN
120.0000 mg | Freq: Two times a day (BID) | SUBCUTANEOUS | Status: DC
  Administered 2014-02-19 – 2014-02-20 (×3): 120 mg via SUBCUTANEOUS
  Filled 2014-02-19 (×5): qty 0.8

## 2014-02-19 MED ORDER — ALBUTEROL SULFATE (2.5 MG/3ML) 0.083% IN NEBU: 2.5000 mg | INHALATION_SOLUTION | RESPIRATORY_TRACT | Status: DC | PRN

## 2014-02-19 MED ORDER — DIGOXIN 0.25 MG/ML IJ SOLN
0.2500 mg | Freq: Once | INTRAMUSCULAR | Status: AC
  Administered 2014-02-19: 0.25 mg via INTRAVENOUS
  Filled 2014-02-19: qty 1

## 2014-02-19 NOTE — H&P (View-Only) (Signed)
TELEMETRY: Reviewed telemetry pt in afib HR 80-110s  [name] is a 63 year old female with COPD, asthma, HTN, and hx of AFL s/p DCCV in 2013 and ablation by Dr. Rayann Heman with 2 months of LMWH (and noted to have stopped coumadin and amio on chart) who presented to [facility] 5/27 with complaints of tightness in her throat with radiation down her chest. Found to be in rapid afib and transferred to [facility] with ?COPD exacerbation as well.   Filed Vitals:   02/19/14 0445 02/19/14 0500 02/19/14 0600 02/19/14 0744  BP:    160/107  Pulse: 149 86 60 62  Temp:    97.9 F (36.6 C)  TempSrc:    Axillary  Resp: 21 21 17 16   Height:      Weight:      SpO2: 91% 95% 99% 100%    Intake/Output Summary (Last 24 hours) at 02/19/14 0823 Last data filed at 02/19/14 0600  Gross per 24 hour  Intake 282.58 ml  Output    100 ml  Net 182.58 ml   Filed Weights   02/19/14 0253  Weight: 274 lb (124.286 kg)   Subjective Lying in bed, feels much improved since yesterday. No chest pain or throat discomfort at this time. No SOB. Reports having nausea yesterday and feeling like she was having a panic attack which led to her coming to the hospital. Has been on coumadin in 20+ years ago for ?PE as per patient and reports taking Lovenox for ~2 months after ablation with Dr. Rayann Heman and has not followed up for at least 1.5 years. She reports chronic cough and was scheduled to have some upcoming dental work next week for which she will need to take antibiotics. She also finished a coarse of prednisone last week for poison oak. Reports symptoms of her throat tightness and change in heart rhythm started around 4pm yesterday.   LABS: Basic Metabolic Panel:  Recent Labs  02/19/14 0525  NA 141  K 3.9  CL 104  CO2 26  GLUCOSE 149*  BUN 13  CREATININE 0.90  CALCIUM 9.0  MG 2.0   CBC:  Recent Labs  02/19/14 0525  WBC 7.1  HGB 9.4*  HCT 31.1*  MCV 90.9  PLT 314   Cardiac Enzymes:  Recent Labs  02/19/14 0420  TROPONINI <0.30   Thyroid Function Tests:  Recent Labs  02/19/14 0525  TSH 2.210   Radiology/Studies:  Portable Chest X-ray 1 View  02/19/2014   CLINICAL DATA:  Shortness of breath  EXAM: PORTABLE CHEST - 1 VIEW  COMPARISON:  Portable exam 0258 hr compared to 02/18/2014  FINDINGS: Enlargement of cardiac silhouette.  Mediastinal contours and pulmonary vascularity normal.  Minimal RIGHT base atelectasis.  Lungs otherwise clear.  No pleural effusion or pneumothorax.  Bones unremarkable.  IMPRESSION: Enlargement of cardiac silhouette with minimal RIGHT basilar atelectasis.  No interval change.   Electronically Signed   By: Lavonia Dana M.D.   On: 02/19/2014 03:06   PHYSICAL EXAM General: lying in bed, NAD, on [place] O2, obese Head: Normocephalic, atraumatic Lungs: Clear bilaterally to auscultation without wheezes, rales, or rhonchi. Breathing is unlabored. Heart: Irregularly irregular, tachycardia Abdomen: Soft, obese, non-tender, non-distended with normoactive bowel sounds Msk:  Strength and tone appears normal for age. Extremities: No clubbing, cyanosis or edema.  Distal pedal pulses are 2+ and equal bilaterally. Neuro: Alert and oriented X 3. Moves all extremities spontaneously. Psych:  Responds to questions appropriately with a normal  affect.  ASSESSMENT AND PLAN: [name] is a 63 yo former smoker female with PMHx of COPD and asthma (not on home O2), mild B/L carotid artery disease, and h/o AFL s/p DCCV in 2013 and ablation by Dr. Rayann Heman who presented to [facility] and found to be in AFib with RVR and ? mild-to-moderate COPD exacerbation and subsequently transferred to [facility].   Afib with RVR--hx of prior episode s/p DCCV and ablation 2013. Used to see Dr. Rayann Heman but has not seen him for >1 year. reports taking lovenox for approximately 2 months s/p ablation.  Onset possibly <24 hours on admission, with unclear etiology.  Possibly secondary to mild COPD exacerbation?  Remains in afib this morning. CE x1 neg. Currently on IV Dilt, TSH wnl 2.2, INR 1.09. Given Digoxin 0.25mg  x1 this morning. Will attempt to cardiovert today.  CHADS-VASc 3, adjusted stroke rate 3.2%/year. -Lovenox therapeutic dose for now -Continue IV Dilt and on metoprolol 5mg  iv qid for now -Continue to cycle CE  Mild COPD exacerbation--started on IV solu-medrol, atrovent nebz, [place] prn. CXR with minimal R basilar atelectasis. No SOB at this time. On dulera at home.  -consider transition to PO prednisone -wean off oxygen  HTN--continued on home dose losartan 100mg  qd, and currently on IV dilt and Lopressor. Holding home Zebeta 10mg .  -held AM dose of ARB given well controlled BP on dilt gtt. Will add parameters  OSA--on CPAP -continue cpap  Present on Admission:  . COPD exacerbation . Atrial fibrillation with RVR . Hypertension . OBESITY, MORBID  Signed: Jerene Pitch, MD PGY-2, Internal Medicine Resident Pager: [phone]  02/19/2014,8:43 AM  Patient seen and examined and history reviewed. Agree with above findings and plan. Patient with history of prior aflutter ablation in 12/13 and history of OSA on CPAP presents with afib with RVR. Clear abrupt onset of symptoms yesterday. Requiring IV meds for rate control. Will update Echo. Since onset less than 48 hours will proceed with DCCV today. Patient is NPO. Post CV will give Xarelto for one month. Discussed with Dr. Rayann Heman.   Ander Slade [facility] 02/19/2014 11:01 AM

## 2014-02-19 NOTE — Anesthesia Preprocedure Evaluation (Addendum)
Anesthesia Evaluation  Patient identified by MRN, date of birth, ID band Patient awake    Reviewed: Allergy & Precautions, H&P , NPO status , Patient's Chart, lab work & pertinent test results  History of Anesthesia Complications Negative for: history of anesthetic complications  Airway Mallampati: III TM Distance: >3 FB Neck ROM: Full    Dental  (+) Teeth Intact, Partial Lower, Dental Advisory Given,    Pulmonary asthma , sleep apnea , pneumonia -, COPDformer smoker,  breath sounds clear to auscultation        Cardiovascular hypertension, Pt. on home beta blockers and Pt. on medications Rhythm:Irregular Rate:Tachycardia     Neuro/Psych  Headaches, PSYCHIATRIC DISORDERS Anxiety  Neuromuscular disease    GI/Hepatic Neg liver ROS, hiatal hernia, GERD-  Medicated and Controlled,  Endo/Other  negative endocrine ROS  Renal/GU negative Renal ROS     Musculoskeletal  (+) Fibromyalgia -  Abdominal   Peds  Hematology   Anesthesia Other Findings   Reproductive/Obstetrics                        Anesthesia Physical Anesthesia Plan  ASA: III  Anesthesia Plan: General   Post-op Pain Management:    Induction: Intravenous  Airway Management Planned: Mask  Additional Equipment:   Intra-op Plan:   Post-operative Plan:   Informed Consent: I have reviewed the patients History and Physical, chart, labs and discussed the procedure including the risks, benefits and alternatives for the proposed anesthesia with the patient or authorized representative who has indicated his/her understanding and acceptance.   Dental advisory given  Plan Discussed with: CRNA, Anesthesiologist and Surgeon  Anesthesia Plan Comments:        Anesthesia Quick Evaluation

## 2014-02-19 NOTE — Interval H&P Note (Signed)
History and Physical Interval Note:  02/19/2014 4:49 PM  [name]  has presented today for surgery, with the diagnosis of a-fib  The various methods of treatment have been discussed with the patient and family. After consideration of risks, benefits and other options for treatment, the patient has consented to  Procedure(s): CARDIOVERSION (N/A) as a surgical intervention .  The patient's history has been reviewed, patient examined, no change in status, stable for surgery.  I have reviewed the patient's chart and labs.  Questions were answered to the patient's satisfaction.     Dorothy Spark

## 2014-02-19 NOTE — H&P (Signed)
History and Physical   Patient ID: [name] MRN: [id number], DOB/AGE: Apr 16, 1951   Admit date: 02/19/2014 Date of Consult: 02/19/2014   Primary Physician: [name]., MD Primary Cardiologist: Dr. Rayann Heman in 2013  HPI: [name] is a 63 y.o. female with PMHx of COPD and asthma (not on home O2) secondary to former h/o cigarette smoking (quit 30 years ago), obesity though leads an active lifestyle, resides at home in [place], HTN, GERD, OA, migraine HA, fibromyalgia, anxiety (self-discontinued prescribed SSRI), mild B/L carotid artery disease and h/o AFL s/p DCCV in 2013 at [facility] followed by outpatient successful AFL ablation by Dr. Rayann Heman at that time; completed 2 months of [facility] with LMWH and then it was discontinued in 2013.  She has since not been seen by Cardiology and follows only with her PCP.  She has reportedly been in good state of health without any angina/dyspnea, palpitations, syncope or interim hospitalizations.  In addition, three weeks ago she had poison ivy/oak rash and was prescribed a short course of oral prednisone which she discontinued about two weeks ago after the rash quickly resolved.  Today she was doing things in the garden when she suddenly felt a tightness around her throat (very intense) followed by some feeling of radiation of the discomfort down her chest towards her epigastric area.  She took a small dose of 2 pills of her prescribed Xanax (which she never takes) and there was no significant improvement over the course of 90 minutes; she thought that maybe she was having a panic attack and so that's why she tried to take Xanax.  At that point she presented to the [facility] ED and was found to be in rapid AFib, negative initial troponin, no ST-T changes on ECG and request was made for transfer to [facility].  She does state that due to the recent weather, she has noticed an increase in her cough, mucus production but no  fevers/chills/rigors.  She has not noted any change in exertional symptoms/resting chest discomfort until the acute onset of symptoms this afternoon (approximately 4pm) while working in her garden.  Problem List: Past Medical History  Diagnosis Date  . Asthma     Spirometry 1989: FEV1 2.12 (71%) with ratio 82.  HFA 90% after coaching 11-09-10  . COPD (chronic obstructive pulmonary disease)   . GERD (gastroesophageal reflux disease)     EGD with HH / esophagitis 06-30-93  . Osteoarthritis   . DDD (degenerative disc disease)   . Pelvic fracture   . Hiatal hernia   . Migraine   . Anxiety disorder   . Anemia   . Diverticulosis   . Adenomatous colon polyp 1993  . Atrial flutter     Status post cardioversion 2-12 2013. s/p RFCA 5/13 with JAllred (coumadin and amio stopped)   . Essential hypertension, benign   . Pneumonia   . Sleep apnea   . Fibromyalgia   . Lipoma of colon   . Internal and external hemorrhoids without complication     Past Surgical History  Procedure Laterality Date  . Total knee arthroplasty      x2 09/2006  . Rotator cuff repair      right  . A flutter ablation  02/22/2012  . Dilation and curettage of uterus    . Colonoscopy  05/2010, 07/2000  . Esophagogastroduodenoscopy  05/2010, 06/1993     Allergies:  Allergies  Allergen Reactions  . Ace Inhibitors Cough  . Epinephrine Other (See  Comments)    Pallpitations during dental procedures  . Propoxyphene Hcl Nausea And Vomiting  . Theophyllines     Home Medications: Prior to Admission medications   Medication Sig Start Date End Date Taking? Authorizing Provider  albuterol (VENTOLIN HFA) 108 (90 BASE) MCG/ACT inhaler Inhale 2 puffs into the lungs every 6 (six) hours as needed. For shortness of breath   Yes Historical Provider, MD  ALPRAZolam Duanne Moron) 0.5 MG tablet Take 0.5 mg by mouth daily as needed. For anxiety   Yes Historical Provider, MD  bisoprolol (ZEBETA) 10 MG tablet Take 1 tablet (10 mg total) by  mouth daily. 10/30/12  Yes Tammy S Parrett, NP  celecoxib (CELEBREX) 200 MG capsule Take 200 mg by mouth. Every 4 days   Yes Historical Provider, MD  dextromethorphan-guaiFENesin (MUCINEX DM) 30-600 MG per 12 hr tablet Take 1 tablet by mouth every 12 (twelve) hours.   Yes Historical Provider, MD  furosemide (LASIX) 20 MG tablet Take 10 mg by mouth daily as needed. For edema   Yes Historical Provider, MD  losartan (COZAAR) 100 MG tablet Take 1 tablet (100 mg total) by mouth daily. 10/30/12  Yes Tammy S Parrett, NP  mometasone-formoterol (DULERA) 100-5 MCG/ACT AERO Inhale 2 puffs into the lungs 2 (two) times daily. 10/30/12  Yes Tammy S Parrett, NP  omeprazole (PRILOSEC) 40 MG capsule take 1 capsule by mouth once daily BEFORE BREAKFAST 10/29/13  Yes Gatha Mayer, MD    Inpatient Medications:  . dextromethorphan-guaiFENesin  1 tablet Oral Q12H  . furosemide  10 mg Oral Daily  . ipratropium  0.5 mg Nebulization Q4H  . losartan  100 mg Oral Daily  . methylPREDNISolone (SOLU-MEDROL) injection  60 mg Intravenous Q12H  . metoprolol  5 mg Intravenous 4 times per day  . pantoprazole  40 mg Oral Daily  . sodium chloride  3 mL Intravenous Q12H   Prescriptions prior to admission  Medication Sig Dispense Refill  . albuterol (VENTOLIN HFA) 108 (90 BASE) MCG/ACT inhaler Inhale 2 puffs into the lungs every 6 (six) hours as needed. For shortness of breath      . ALPRAZolam (XANAX) 0.5 MG tablet Take 0.5 mg by mouth daily as needed. For anxiety      . bisoprolol (ZEBETA) 10 MG tablet Take 1 tablet (10 mg total) by mouth daily.  30 tablet  2  . celecoxib (CELEBREX) 200 MG capsule Take 200 mg by mouth. Every 4 days      . dextromethorphan-guaiFENesin (MUCINEX DM) 30-600 MG per 12 hr tablet Take 1 tablet by mouth every 12 (twelve) hours.      . furosemide (LASIX) 20 MG tablet Take 10 mg by mouth daily as needed. For edema      . losartan (COZAAR) 100 MG tablet Take 1 tablet (100 mg total) by mouth daily.  30 tablet   2  . mometasone-formoterol (DULERA) 100-5 MCG/ACT AERO Inhale 2 puffs into the lungs 2 (two) times daily.  1 Inhaler  5  . omeprazole (PRILOSEC) 40 MG capsule take 1 capsule by mouth once daily BEFORE BREAKFAST  90 capsule  3  . [DISCONTINUED] olmesartan (BENICAR) 40 MG tablet Take 1 tablet (40 mg total) by mouth daily.      . [DISCONTINUED] predniSONE (DELTASONE) 10 MG tablet Take 4 tabs x 2 days, 2 tabs x 2 days, 1 tab x 2 days then stop  14 tablet  0  . [DISCONTINUED] cefdinir (OMNICEF) 300 MG capsule Take 1 capsule (300  mg total) by mouth 2 (two) times daily.  20 capsule  0    Family History  Problem Relation Age of Onset  . Emphysema Father     smoker  . Heart disease Father   . Breast cancer Mother   . Breast cancer Sister   . Heart disease Sister   . Colon cancer Neg Hx      History   Social History  . Marital Status: Widowed    Spouse Name: N/A    Number of Children: 0  . Years of Education: N/A   Occupational History  . Accounting tech at [employer] History Main Topics  . Smoking status: Former Smoker -- 1.00 packs/day for 20 years    Types: Cigarettes    Quit date: 09/25/1984  . Smokeless tobacco: Never Used  . Alcohol Use: No  . Drug Use: No  . Sexual Activity: Not Currently   Other Topics Concern  . Not on file   Social History Narrative  . No narrative on file     Review of Systems: All other systems reviewed and are otherwise negative except as noted above.  Physical Exam: Blood pressure 132/92, pulse 129, temperature 99.2 F (37.3 C), resp. rate 23, SpO2 100.00%. General: Well developed, well nourished, in no acute distress.  Mildly anxious. Head: Normocephalic, atraumatic, sclera non-icteric, no xanthomas, nares are without discharge.  Neck: Negative for carotid bruits. JVD not elevated. Lungs: Breathing is mildly labored, B/L wheezes and ronchi diffusely.  No crackles. Heart: Rapid, irregularly irregular, +S1 +S2.   No murmurs, rubs, or gallops appreciated. Abdomen:  Obese, soft, non-tender, non-distended with normoactive bowel sounds. No hepatomegaly. No rebound/guarding. No obvious abdominal masses. Msk:  Strength and tone appears normal for age. Extremities:  No clubbing, cyanosis or edema.  Distal pedal pulses are 2+ and equal bilaterally. Neuro: Alert and oriented X 3. Moves all extremities spontaneously. Psych:  Responds to questions appropriately with a normal affect.  Labs: No results found for this basename: WBC, HGB, HCT, MCV, PLT,  in the last 72 hours No results found for this basename: VITAMINB12, FOLATE, FERRITIN, TIBC, IRON, RETICCTPCT,  in the last 72 hours No results found for this basename: DDIMER,  in the last 72 hours No results found for this basename: NA, K, CL, CO2, BUN, CREATININE, CALCIUM, LABALBU, PROT, BILITOT, ALKPHOS, ALT, AST, AMYLASE, LIPASE, GLUCOSE,  in the last 168 hours No results found for this basename: HGBA1C,  in the last 72 hours No results found for this basename: CKTOTAL, CKMB, CKMBINDEX, TROPONINI,  in the last 72 hours No components found with this basename: POCBNP,  No results found for this basename: CHOL, HDL, LDLCALC, TRIG, CHOLHDL, LDLDIRECT,  in the last 72 hours No results found for this basename: TSH, T4TOTAL, FREET3, T3FREE, THYROIDAB,  in the last 72 hours  Radiology/Studies: No results found.  02/19/14 12-lead ECG on arrival to [facility]:  Rapid AFib at 151 bpm, nonspecific ST changes.  No significant change in comparison to prior ECG at OSH.  ASSESSMENT:  63 yo WF with PMHx of COPD and asthma (not on home O2) secondary to former h/o cigarette smoking (quit 30 years ago), obesity though leads an active lifestyle, resides at home in [facility], HTN, GERD, OA, migraine HA, fibromyalgia, anxiety (self-discontinued prescribed SSRI), mild B/L carotid artery disease and h/o AFL s/p DCCV in 2013 at [facility] followed by outpatient successful AFL ablation by  Dr. Rayann Heman at that time;  completed 2 months of AC with LMWH and then it was discontinued in 2013.  She now presents with AFib with RVR and findings c/w mild-to-moderate COPD exacerbation.  PLAN:  1-Admit to [facility], on-call attending, Dr. Mare Ferrari.  ICU level of care given concerns about ongoing AFib with RVR and chest discomfort. 2-AFib with RVR:  Likely onset within past 24 hours based on patient's story however cannot be certain-thus consider TEE + DCCV if remains in AFib by AM.  Will R/O ACS.  Can evaluate LV function by TEE but if complete surface Echo is needed after rate is controlled/rhythm is restored to sinus then it can be ordered at that time.  Rate control will be employed overnight with Diltiazem infusion and IV Metoprolol (low risk for beta blocker related bronchospasm as patient tolerates bisoprolol daily at home).  If issues with IV metoprolol tolerance, then can utilize po coreg as an alternative OR restart bisoprolol OR consider IV digoxin loading (patient has already received single dose 0.5mg  at OSH with no improvement in HR). 3-Throat and epigastric pain during acute onset of symptoms:  This was likely a manifestation of the AFib with RVR although sorting out such symptoms from ACS can be challenging in this setting.  Will proceed with R/O ACS (first biomarker negative at OSH), evaluate LV function by echo once rate controlled or in SR after DCCV and assess symptoms and ECG closely.  If symptoms persist after SR restored, then consider nuclear stress testing versus coronary angiography. 4-COPD/bronchitis exacerbation:  IV solu-medrol 60mg  q12 hrs, atrovent nebulizers q4 hrs, avoid albuterol for the time being, hold home regimen of Dulera and Ventolin, nasal cannula to maintain SpO2 >90%.  Will obtain portable CXR.  ABG if needed but not indicated at the present time. 5-Anticoagulation for primary prevention of stroke in AFib and for potential rhythm control strategy:  Start therapeutic  [place] consult for AFib.  Will also check daily PT/INR incase warfarin is initiated post-cardioversion (alternatively a NOAC could be considered in which case PT/INR's will no longer need to be checked). 6-Check TSH and HbA1C. 7-GERD:  Change to Protonix (due to our hospital formulary). 8-DVT PPx:  Addressed due to therapeutic LMWH ordered for AFib.   Code Status:  FULL CODE.  Signed, Charlton Haws, MD [facility] Cardiology [place]  02/19/2014, 2:47 AM

## 2014-02-19 NOTE — Progress Notes (Signed)
RT Note: D/C'd Atrovent order. Pt states she takes Albuterol PRN A @ home & will call respiratory if she needs a breathing treatment. Pt tolerating well at this time, RT will continue to monitor.

## 2014-02-19 NOTE — Progress Notes (Signed)
Pt's heart rate staying in the 140's-150's with cardizem gtt at 15mg /hr and IV lopressor given. Patient is asymptomatic and sitting comfortably in bed, BP 126/103. Notified Dr. Donneta Romberg and new orders given for digoxin to maintain HR at goal of < 120. Will continue to monitor patient closely.

## 2014-02-19 NOTE — CV Procedure (Signed)
    Cardioversion Note  [name] [id number] 12/27/1950  Procedure: DC Cardioversion Indications: a-fib with RVR  Procedure Details Consent: Obtained Time Out: Verified patient identification, verified procedure, site/side was marked, verified correct patient position, special equipment/implants available, Radiology Safety Procedures followed,  medications/allergies/relevent history reviewed, required imaging and test results available.  Performed  The patient has been on adequate anticoagulation.  The patient received 80 mg of IV Propofol for sedation.  Synchronous cardioversion was performed at 120 joules x 1.  The cardioversion was successful   Complications: No apparent complications Patient did tolerate procedure well.   Dorothy Spark, MD, [facility] 02/19/2014, 4:52 PM

## 2014-02-19 NOTE — Progress Notes (Signed)
Pt. Refused cpap. Pt. States she only tolerates nasal prongs. Pt. States she will notify if she changes her mind and wants to try the mask.

## 2014-02-19 NOTE — Transfer of Care (Signed)
Immediate Anesthesia Transfer of Care Note  Patient: [name]  Procedure(s) Performed: Procedure(s): CARDIOVERSION (N/A)  Patient Location: Nursing Unit  Anesthesia Type:MAC  Level of Consciousness: awake, alert  and oriented  Airway & Oxygen Therapy: Patient Spontanous Breathing and Patient connected to nasal cannula oxygen  Post-op Assessment: Report given to PACU RN  Post vital signs: Reviewed and stable  Complications: No apparent anesthesia complications

## 2014-02-19 NOTE — Care Management Note (Addendum)
    Page 1 of 1   02/20/2014     12:58:45 PM CARE MANAGEMENT NOTE 02/20/2014  Patient:  [name], [name]   Account Number:  [id number]  Date Initiated:  02/19/2014  Documentation initiated by:  Elissa Hefty  Subjective/Objective Assessment:   adm w at fibw rvr     Action/Plan:   lives alone, pcp dr d vyas   Anticipated DC Date:  02/20/2014   Anticipated DC Plan:  [place]  CM consult  Medication Assistance      Choice offered to / List presented to:             Status of service:   Medicare Important Message given?   (If response is "NO", the following Medicare IM given date fields will be blank) Date Medicare IM given:   Date Additional Medicare IM given:    Discharge Disposition:  HOME/SELF CARE  Per UR Regulation:  Reviewed for med. necessity/level of care/duration of stay  If discussed at [place] of Stay Meetings, dates discussed:    Comments:  5/29 1300 debbie Sharrieff Spratlin rn,bsn gave pt xarelto 30day free card.

## 2014-02-19 NOTE — Progress Notes (Signed)
ANTICOAGULATION CONSULT NOTE - Initial Consult  Pharmacy Consult for Lovenox  Indication: atrial fibrillation  Allergies  Allergen Reactions  . Ace Inhibitors Cough  . Epinephrine Other (See Comments)    Pallpitations during dental procedures  . Propoxyphene Hcl Nausea And Vomiting  . Theophyllines    Patient Measurements: Weight: 274 lb (124.286 kg) (per 2H RN)  Vital Signs: Temp: 99.2 F (37.3 C) (05/28 0200) BP: 132/92 mmHg (05/28 0200) Pulse Rate: 129 (05/28 0200)  Medical History: Past Medical History  Diagnosis Date  . Asthma     Spirometry 1989: FEV1 2.12 (71%) with ratio 82.  HFA 90% after coaching 11-09-10  . COPD (chronic obstructive pulmonary disease)   . GERD (gastroesophageal reflux disease)     EGD with HH / esophagitis 06-30-93  . Osteoarthritis   . DDD (degenerative disc disease)   . Pelvic fracture   . Hiatal hernia   . Migraine   . Anxiety disorder   . Anemia   . Diverticulosis   . Adenomatous colon polyp 1993  . Atrial flutter     Status post cardioversion 2-12 2013. s/p RFCA 5/13 with JAllred (coumadin and amio stopped)   . Essential hypertension, benign   . Pneumonia   . Sleep apnea   . Fibromyalgia   . Lipoma of colon   . Internal and external hemorrhoids without complication     Assessment: 63 y/o F to start Lovenox for afib. Pt is s/p DCCV in 2013 with no problems since. Labs ordered.   Goal of Therapy:  Monitor platelets by anticoagulation protocol: Yes   Plan:  -Lovenox 120 mg subcutaneous BID -F/U AM labs, including SCr, to ensure appropriate dosing -Minimum q72h CBC  -Monitor for bleeding  Narda Bonds 02/19/2014,2:56 AM

## 2014-02-19 NOTE — Progress Notes (Signed)
TELEMETRY: Reviewed telemetry pt in afib HR 80-110s  [name] is a 63 year old female with COPD, asthma, HTN, and hx of AFL s/p DCCV in 2013 and ablation by Dr. Rayann Heman with 2 months of LMWH (and noted to have stopped coumadin and amio on chart) who presented to [facility] 5/27 with complaints of tightness in her throat with radiation down her chest. Found to be in rapid afib and transferred to [facility] with ?COPD exacerbation as well.   Filed Vitals:   02/19/14 0445 02/19/14 0500 02/19/14 0600 02/19/14 0744  BP:    160/107  Pulse: 149 86 60 62  Temp:    97.9 F (36.6 C)  TempSrc:    Axillary  Resp: 21 21 17 16   Height:      Weight:      SpO2: 91% 95% 99% 100%    Intake/Output Summary (Last 24 hours) at 02/19/14 0823 Last data filed at 02/19/14 0600  Gross per 24 hour  Intake 282.58 ml  Output    100 ml  Net 182.58 ml   Filed Weights   02/19/14 0253  Weight: 274 lb (124.286 kg)   Subjective Lying in bed, feels much improved since yesterday. No chest pain or throat discomfort at this time. No SOB. Reports having nausea yesterday and feeling like she was having a panic attack which led to her coming to the hospital. Has been on coumadin in 20+ years ago for ?PE as per patient and reports taking Lovenox for ~2 months after ablation with Dr. Rayann Heman and has not followed up for at least 1.5 years. She reports chronic cough and was scheduled to have some upcoming dental work next week for which she will need to take antibiotics. She also finished a coarse of prednisone last week for poison oak. Reports symptoms of her throat tightness and change in heart rhythm started around 4pm yesterday.   LABS: Basic Metabolic Panel:  Recent Labs  02/19/14 0525  NA 141  K 3.9  CL 104  CO2 26  GLUCOSE 149*  BUN 13  CREATININE 0.90  CALCIUM 9.0  MG 2.0   CBC:  Recent Labs  02/19/14 0525  WBC 7.1  HGB 9.4*  HCT 31.1*  MCV 90.9  PLT 314   Cardiac Enzymes:  Recent Labs  02/19/14 0420  TROPONINI <0.30   Thyroid Function Tests:  Recent Labs  02/19/14 0525  TSH 2.210   Radiology/Studies:  Portable Chest X-ray 1 View  02/19/2014   CLINICAL DATA:  Shortness of breath  EXAM: PORTABLE CHEST - 1 VIEW  COMPARISON:  Portable exam 0258 hr compared to 02/18/2014  FINDINGS: Enlargement of cardiac silhouette.  Mediastinal contours and pulmonary vascularity normal.  Minimal RIGHT base atelectasis.  Lungs otherwise clear.  No pleural effusion or pneumothorax.  Bones unremarkable.  IMPRESSION: Enlargement of cardiac silhouette with minimal RIGHT basilar atelectasis.  No interval change.   Electronically Signed   By: Lavonia Dana M.D.   On: 02/19/2014 03:06   PHYSICAL EXAM General: lying in bed, NAD, on [address] O2, obese Head: Normocephalic, atraumatic Lungs: Clear bilaterally to auscultation without wheezes, rales, or rhonchi. Breathing is unlabored. Heart: Irregularly irregular, tachycardia Abdomen: Soft, obese, non-tender, non-distended with normoactive bowel sounds Msk:  Strength and tone appears normal for age. Extremities: No clubbing, cyanosis or edema.  Distal pedal pulses are 2+ and equal bilaterally. Neuro: Alert and oriented X 3. Moves all extremities spontaneously. Psych:  Responds to questions appropriately with a normal  affect.  ASSESSMENT AND PLAN: [name] is a 63 yo former smoker female with PMHx of COPD and asthma (not on home O2), mild B/L carotid artery disease, and h/o AFL s/p DCCV in 2013 and ablation by Dr. Rayann Heman who presented to [facility] and found to be in AFib with RVR and ? mild-to-moderate COPD exacerbation and subsequently transferred to [facility].   Afib with RVR--hx of prior episode s/p DCCV and ablation 2013. Used to see Dr. Rayann Heman but has not seen him for >1 year. reports taking lovenox for approximately 2 months s/p ablation.  Onset possibly <24 hours on admission, with unclear etiology.  Possibly secondary to mild COPD exacerbation?  Remains in afib this morning. CE x1 neg. Currently on IV Dilt, TSH wnl 2.2, INR 1.09. Given Digoxin 0.25mg  x1 this morning. Will attempt to cardiovert today.  CHADS-VASc 3, adjusted stroke rate 3.2%/year. -Lovenox therapeutic dose for now -Continue IV Dilt and on metoprolol 5mg  iv qid for now -Continue to cycle CE  Mild COPD exacerbation--started on IV solu-medrol, atrovent nebz, [place] prn. CXR with minimal R basilar atelectasis. No SOB at this time. On dulera at home.  -consider transition to PO prednisone -wean off oxygen  HTN--continued on home dose losartan 100mg  qd, and currently on IV dilt and Lopressor. Holding home Zebeta 10mg .  -held AM dose of ARB given well controlled BP on dilt gtt. Will add parameters  OSA--on CPAP -continue cpap  Present on Admission:  . COPD exacerbation . Atrial fibrillation with RVR . Hypertension . OBESITY, MORBID  Signed: Jerene Pitch, MD PGY-2, Internal Medicine Resident Pager: [phone]  02/19/2014,8:43 AM  Patient seen and examined and history reviewed. Agree with above findings and plan. Patient with history of prior aflutter ablation in 12/13 and history of OSA on CPAP presents with afib with RVR. Clear abrupt onset of symptoms yesterday. Requiring IV meds for rate control. Will update Echo. Since onset less than 48 hours will proceed with DCCV today. Patient is NPO. Post CV will give Xarelto for one month. Discussed with Dr. Rayann Heman.   Ander Slade [facility] 02/19/2014 11:01 AM

## 2014-02-19 NOTE — Anesthesia Postprocedure Evaluation (Signed)
  Anesthesia Post-op Note  Patient: [name]  Procedure(s) Performed: Procedure(s): CARDIOVERSION (N/A)  Patient Location: Nursing Unit  Anesthesia Type:MAC  Level of Consciousness: awake, alert  and oriented  Airway and Oxygen Therapy: Patient Spontanous Breathing and Patient connected to nasal cannula oxygen  Post-op Pain: none  Post-op Assessment: Post-op Vital signs reviewed  Post-op Vital Signs: Reviewed and stable  Last Vitals:  Filed Vitals:   02/19/14 1626  BP: 145/90  Pulse: 135  Temp: 36.3 C  Resp: 21    Complications: No apparent anesthesia complications

## 2014-02-20 DIAGNOSIS — E119 Type 2 diabetes mellitus without complications: Secondary | ICD-10-CM | POA: Diagnosis not present

## 2014-02-20 DIAGNOSIS — I517 Cardiomegaly: Secondary | ICD-10-CM

## 2014-02-20 DIAGNOSIS — J441 Chronic obstructive pulmonary disease with (acute) exacerbation: Secondary | ICD-10-CM

## 2014-02-20 LAB — BASIC METABOLIC PANEL
BUN: 19 mg/dL (ref 6–23)
CO2: 20 mEq/L (ref 19–32)
Calcium: 9.1 mg/dL (ref 8.4–10.5)
Chloride: 98 mEq/L (ref 96–112)
Creatinine, Ser: 1.01 mg/dL (ref 0.50–1.10)
GFR calc Af Amer: 66 mL/min — ABNORMAL LOW (ref >90)
GFR, EST NON AFRICAN AMERICAN: 57 mL/min — AB (ref >90)
GLUCOSE: 221 mg/dL — AB (ref 70–99)
Potassium: 4.5 mEq/L (ref 3.7–5.3)
SODIUM: 135 meq/L — AB (ref 137–147)

## 2014-02-20 LAB — CBC
HCT: 30.1 % — ABNORMAL LOW (ref 36.0–46.0)
HEMOGLOBIN: 9.6 g/dL — AB (ref 12.0–15.0)
MCH: 28.9 pg (ref 26.0–34.0)
MCHC: 31.9 g/dL (ref 30.0–36.0)
MCV: 90.7 fL (ref 78.0–100.0)
Platelets: 341 10*3/uL (ref 150–400)
RBC: 3.32 MIL/uL — ABNORMAL LOW (ref 3.87–5.11)
RDW: 16.2 % — ABNORMAL HIGH (ref 11.5–15.5)
WBC: 10 10*3/uL (ref 4.0–10.5)

## 2014-02-20 LAB — MAGNESIUM: Magnesium: 1.9 mg/dL (ref 1.5–2.5)

## 2014-02-20 LAB — HEMOGLOBIN A1C
HEMOGLOBIN A1C: 6.6 % — AB (ref <5.7)
Mean Plasma Glucose: 143 mg/dL — ABNORMAL HIGH (ref <117)

## 2014-02-20 MED ORDER — BISOPROLOL FUMARATE 10 MG PO TABS
10.0000 mg | ORAL_TABLET | Freq: Every day | ORAL | Status: DC
  Administered 2014-02-20: 10 mg via ORAL
  Filled 2014-02-20: qty 1

## 2014-02-20 MED ORDER — ALBUTEROL SULFATE (2.5 MG/3ML) 0.083% IN NEBU: 2.5000 mg | INHALATION_SOLUTION | RESPIRATORY_TRACT | Status: DC | PRN

## 2014-02-20 MED ORDER — RIVAROXABAN 20 MG PO TABS: 20.0000 mg | ORAL_TABLET | Freq: Every day | ORAL | Status: DC

## 2014-02-20 MED ORDER — LORAZEPAM 0.5 MG PO TABS: 0.5000 mg | ORAL_TABLET | Freq: Two times a day (BID) | ORAL | Status: DC | PRN

## 2014-02-20 MED ORDER — RIVAROXABAN 20 MG PO TABS
20.0000 mg | ORAL_TABLET | Freq: Every day | ORAL | Status: DC
  Filled 2014-02-20: qty 1

## 2014-02-20 MED ORDER — LEVALBUTEROL HCL 0.63 MG/3ML IN NEBU: 0.6300 mg | INHALATION_SOLUTION | Freq: Four times a day (QID) | RESPIRATORY_TRACT | Status: DC | PRN

## 2014-02-20 MED ORDER — OLMESARTAN MEDOXOMIL 40 MG PO TABS: 40.0000 mg | ORAL_TABLET | Freq: Every day | ORAL | Status: DC

## 2014-02-20 MED ORDER — MOMETASONE FURO-FORMOTEROL FUM 100-5 MCG/ACT IN AERO
2.0000 | INHALATION_SPRAY | Freq: Two times a day (BID) | RESPIRATORY_TRACT | Status: DC
  Administered 2014-02-20: 2 via RESPIRATORY_TRACT
  Filled 2014-02-20: qty 8.8

## 2014-02-20 NOTE — Discharge Summary (Signed)
Patient seen and examined and history reviewed. Agree with above findings and plan. See my earlier rounding note.  Ander Slade [facility] 02/20/2014 1:52 PM

## 2014-02-20 NOTE — Discharge Instructions (Addendum)
Please follow up with Dr. Jackalyn Lombard office on discharge within a couple of weeks, his office should call you with an appointment.   Take xarelto daily, first dose tonight 02/20/14 at 8pm  Please all follow up with your pcp office in one week  If your symptoms return and/or you notice irregular heart beat or chest pain, call your cardiologist office right away or go to emergency room  You have been diagnosed with diabetes this admission. Please work on your diet and portion control and discuss further with pcp.   Information on my medicine - XARELTO (Rivaroxaban)  This medication education was reviewed with me or my healthcare representative as part of my discharge preparation.  The pharmacist that spoke with me during my hospital stay was:  Rikki Spearing, [facility]  Why was Xarelto prescribed for you? Xarelto was prescribed for you to reduce the risk of a blood clot forming that can cause a stroke if you have a medical condition called atrial fibrillation (a type of irregular heartbeat).  What do you need to know about xarelto ? Take your Xarelto ONCE DAILY at the same time every day with your evening meal. If you have difficulty swallowing the tablet whole, you may crush it and mix in applesauce just prior to taking your dose.  Take Xarelto exactly as prescribed by your doctor and DO NOT stop taking Xarelto without talking to the doctor who prescribed the medication.  Stopping without other stroke prevention medication to take the place of Xarelto may increase your risk of developing a clot that causes a stroke.  Refill your prescription before you run out.  After discharge, you should have regular check-up appointments with your healthcare provider that is prescribing your Xarelto.  In the future your dose may need to be changed if your kidney function or weight changes by a significant amount.  What do you do if you miss a dose? If you are taking Xarelto ONCE DAILY and you miss a dose,  take it as soon as you remember on the same day then continue your regularly scheduled once daily regimen the next day. Do not take two doses of Xarelto at the same time or on the same day.   Important Safety Information A possible side effect of Xarelto is bleeding. You should call your healthcare provider right away if you experience any of the following:   Bleeding from an injury or your nose that does not stop.   Unusual colored urine (red or dark brown) or unusual colored stools (red or black).   Unusual bruising for unknown reasons.   A serious fall or if you hit your head (even if there is no bleeding).  Some medicines may interact with Xarelto and might increase your risk of bleeding while on Xarelto. To help avoid this, consult your healthcare provider or pharmacist prior to using any new prescription or non-prescription medications, including herbals, vitamins, non-steroidal anti-inflammatory drugs (NSAIDs) and supplements.  This website has more information on Xarelto: [email or web address]   Atrial Fibrillation Atrial fibrillation is a condition that causes your heart to beat irregularly. It may also cause your heart to beat faster than normal. Atrial fibrillation can prevent your heart from pumping blood normally. It increases your risk of stroke and heart problems. HOME CARE  Take medications as told by your doctor.  Only take medications that your doctor says are safe. Some medications can make the condition worse or happen again.  If blood thinners were prescribed by your  doctor, take them exactly as told. Too much can cause bleeding. Too little and you will not have the needed protection against stroke and other problems.  Perform blood tests at home if told by your doctor.  Perform blood tests exactly as told by your doctor.  Do not drink alcohol.  Do not drink beverages with caffeine such as coffee, soda, and some teas.  Maintain a healthy weight.  Do not use diet  pills unless your doctor says they are safe. They may make heart problems worse.  Follow diet instructions as told by your doctor.  Exercise regularly as told by your doctor.  Keep all follow-up appointments. GET HELP RIGHT AWAY IF:   You have chest or belly (abdominal) pain.  You feel sick to your stomach (nauseous)  You suddenly have swollen feet and ankles.  You feel dizzy.  You face, arms, or legs feel numb or weak.  There is a change in your vision or speech.  You notice a change in the speed, rhythm, or strength of your heartbeat.  You suddenly begin peeing (urinating) more often.  You get tired more easily when moving or exercising. MAKE SURE YOU:   Understand these instructions.  Will watch your condition.  Will get help right away if you are not doing well or get worse. Document Released: 06/20/2008 Document Revised: 01/06/2013 Document Reviewed: 10/22/2012 [facility] Patient Information 2014 [place].

## 2014-02-20 NOTE — Progress Notes (Signed)
TELEMETRY: Reviewed telemetry pt in NSR 70-80s  [name] is a 63 year old female with COPD, asthma, HTN, and hx of AFL s/p DCCV in 2013 and ablation by Dr. Rayann Heman with 2 months of LMWH (and noted to have stopped coumadin and amio on chart) who presented to [facility] 5/27 with complaints of tightness in her throat with radiation down her chest. Found to be in rapid afib and transferred to [facility] with ?COPD exacerbation as well. S/p successful DCCV 02/19/14  Filed Vitals:   02/20/14 0400 02/20/14 0448 02/20/14 0500 02/20/14 0600  BP: 132/48  138/52 143/49  Pulse: 75  74 71  Temp:  97.5 F (36.4 C)    TempSrc:  Oral    Resp: 20  12 19   Height:      Weight:   275 lb (124.739 kg)   SpO2: 99%  97% 98%    Intake/Output Summary (Last 24 hours) at 02/20/14 0745 Last data filed at 02/20/14 0600  Gross per 24 hour  Intake 1050.5 ml  Output   1650 ml  Net -599.5 ml   Filed Weights   02/19/14 0253 02/20/14 0500  Weight: 274 lb (124.286 kg) 275 lb (124.739 kg)   Subjective Remains in sinus rhythm this morning, no complaints. Refused cpap mask overnight. Could not sleep at all and feels like she has a lot of energy.   LABS: Basic Metabolic Panel:  Recent Labs  02/19/14 0525 02/20/14 0338  NA 141 135*  K 3.9 4.5  CL 104 98  CO2 26 20  GLUCOSE 149* 221*  BUN 13 19  CREATININE 0.90 1.01  CALCIUM 9.0 9.1  MG 2.0 1.9   CBC:  Recent Labs  02/19/14 0525 02/20/14 0338  WBC 7.1 10.0  HGB 9.4* 9.6*  HCT 31.1* 30.1*  MCV 90.9 90.7  PLT 314 341   Cardiac Enzymes:  Recent Labs  02/19/14 0420 02/19/14 0915 02/19/14 1400  TROPONINI <0.30 <0.30 <0.30   Thyroid Function Tests:  Recent Labs  02/19/14 0525  TSH 2.210   Radiology/Studies:  Portable Chest X-ray 1 View  02/19/2014   CLINICAL DATA:  Shortness of breath  EXAM: PORTABLE CHEST - 1 VIEW  COMPARISON:  Portable exam 0258 hr compared to 02/18/2014  FINDINGS: Enlargement of cardiac silhouette.  Mediastinal  contours and pulmonary vascularity normal.  Minimal RIGHT base atelectasis.  Lungs otherwise clear.  No pleural effusion or pneumothorax.  Bones unremarkable.  IMPRESSION: Enlargement of cardiac silhouette with minimal RIGHT basilar atelectasis.  No interval change.   Electronically Signed   By: Lavonia Dana M.D.   On: 02/19/2014 03:06   PHYSICAL EXAM General: sitting in chair, NAD, obese Head: Normocephalic, atraumatic Lungs: Clear bilaterally to auscultation without wheezes, rales, or rhonchi. Breathing is unlabored. Heart: RRR, +SEM loudest LUSB Abdomen: Soft, obese, non-tender, non-distended with normoactive bowel sounds Msk:  Strength and tone appears normal for age. Extremities: No clubbing, cyanosis or edema. Neuro: Alert and oriented X 3. Moves all extremities spontaneously. Psych:  Responds to questions appropriately with a normal affect.  ASSESSMENT AND PLAN: [name] is a 63 yo former smoker female with PMHx of COPD and asthma (not on home O2), mild B/L carotid artery disease, and h/o AFL s/p DCCV in 2013 and ablation by Dr. Rayann Heman who presented to [facility] and found to be in AFib with RVR and ? mild-to-moderate COPD exacerbation and subsequently transferred to [facility].   Afib with RVR--s/p successful DCCV 5/28. Remains in sinus  rhythm today. Cardiac enzymes x3 negative. TSH wnl 2.2, INR 1.09.  CHADS-VASc 3, adjusted stroke rate 3.2%/year. -On Lovenox therapeutic dose for now, transition to [place] gtt and restart home BB - DC home today.  COPD--started on IV solu-medrol, atrovent nebz, [place] prn initially. CXR with minimal R basilar atelectasis. No SOB at this time. On dulera at home.  -discontinued steroids given stable respiratory status, not requiring supplemental o2.  -restarted home dulera  HTN--home medications: Benicar 40mg  qd and Zebeta 10mg  qd.  Started on IV dilt and Lopressor. Holding home Zebeta 10mg .  -continue ARB -d/c dilt gtt -restart home  BB  OSA--on CPAP -continue cpap (only tolerates nasal prongs)  Present on Admission:  . COPD exacerbation . Atrial fibrillation with RVR . Hypertension . OBESITY, MORBID  Signed: Jerene Pitch, MD PGY-2, Internal Medicine Resident Pager: [phone]  02/20/2014,7:45 AM  Case discussed and patient seen with Dr. [place]  Patient seen and examined and history reviewed. Agree with above findings and plan. Cardioverted successfully yesterday. Feels well today. Will resume home BP meds of Zbeta and Benicar. Recommend Xarelto po for one month. Will DC home today and follow up with Dr. Rayann Heman in 2 weeks.  Ander Slade [facility] 02/20/2014 11:58 AM

## 2014-02-20 NOTE — Progress Notes (Signed)
  Echocardiogram 2D Echocardiogram has been performed.  Basilia Jumbo 02/20/2014, 11:20 AM

## 2014-02-20 NOTE — Discharge Summary (Signed)
Name: [name] MRN: [id number] DOB: 1951/05/16 63 y.o. PCP: [name], MD  Date of Admission: 02/19/2014  1:57 AM Date of Discharge: 02/20/2014 Attending Physician: Darlin Coco, MD  Discharge Diagnosis: Principal Problem:   Atrial fibrillation with RVR Active Problems:   OBESITY, MORBID   Long term (current) use of anticoagulants   Obstructive sleep apnea   COPD (chronic obstructive pulmonary disease)   Hypertension   DM2 (diabetes mellitus, type 2)  Discharge Medications:   Medication List    STOP taking these medications       ALPRAZolam 0.5 MG tablet  Commonly known as:  XANAX     cefdinir 300 MG capsule  Commonly known as:  OMNICEF     olmesartan 40 MG tablet  Commonly known as:  BENICAR     predniSONE 10 MG tablet  Commonly known as:  DELTASONE      TAKE these medications       bisoprolol 10 MG tablet  Commonly known as:  ZEBETA  Take 1 tablet (10 mg total) by mouth daily.     celecoxib 200 MG capsule  Commonly known as:  CELEBREX  Take 200 mg by mouth daily as needed for mild pain.     fluticasone 50 MCG/ACT nasal spray  Commonly known as:  FLONASE  Place 2 sprays into both nostrils daily as needed for allergies or rhinitis.     furosemide 20 MG tablet  Commonly known as:  LASIX  Take 20 mg by mouth daily as needed for edema.     LORazepam 0.5 MG tablet  Commonly known as:  ATIVAN  Take 1 tablet (0.5 mg total) by mouth every 12 (twelve) hours as needed for anxiety.     losartan 100 MG tablet  Commonly known as:  COZAAR  Take 1 tablet (100 mg total) by mouth daily.     mometasone-formoterol 100-5 MCG/ACT Aero  Commonly known as:  DULERA  Inhale 2 puffs into the lungs 2 (two) times daily.     multivitamin with minerals Tabs tablet  Take 1 tablet by mouth daily.     omeprazole 40 MG capsule  Commonly known as:  PRILOSEC  Take 40 mg by mouth daily before breakfast.     rivaroxaban 20 MG Tabs tablet  Commonly known as:   XARELTO  Take 1 tablet (20 mg total) by mouth daily with supper.     VENTOLIN HFA 108 (90 BASE) MCG/ACT inhaler  Generic drug:  albuterol  Inhale 2 puffs into the lungs every 6 (six) hours as needed. For shortness of breath     Vitamin D3 5000 UNITS Caps  Take 5,000 Units by mouth daily.       Disposition and follow-up:   [name] was discharged from [facility] in Stable condition.  At the hospital follow up visit please address:  Afib RVR--s/p successful DCCV 02/19/14. Discharged on Xarelto for at least 1 month. Follows up with Dr. Rayann Heman. Restarted home BB (Zebeta) and ARB (Losartan); adjust doses accordingly.   DM2--new diagnosis. HbA1C 6.5. Will need to follow up with PCP and discuss further management. Recommended diet control at this time, did not start metformin this admission.   HTN--discharged on home Zebeta 10mg  and Losartan 100mg  qd. May need to adjust dose in future if BP uncontrolled.   Panic attacks/anxiety--as per patient. Has xanax at home. Reports improvement with ativan when given at outside hospital. Discharged with limited supply of ativan but  to be reassessed by PCP  2.  Labs / imaging needed at time of follow-up: consider BMET to follow up Na  3.  Pending labs/ test needing follow-up: Echo done 5/29, read pending  Follow-up Appointments: Follow-up Information   Follow up with VYAS,DHRUV B., MD. Schedule an appointment as soon as possible for a visit in 1 week.   Specialty:  Internal Medicine   Contact information:   [place]  [id number] [phone]       Follow up with Thompson Grayer, MD.   Specialty:  Cardiology   Contact information:   [address] [place] [address] [phone]      Discharge Instructions: Discharge Instructions   Call MD for:  difficulty breathing, headache or visual disturbances    Complete by:  As directed      Call MD for:  severe uncontrolled pain    Complete by:   As directed      Call MD for:    Complete by:  As directed   Irregular heart rate     Diet - low sodium heart healthy    Complete by:  As directed      Increase activity slowly    Complete by:  As directed           Procedures Performed:  Portable Chest X-ray 1 View  02/19/2014   CLINICAL DATA:  Shortness of breath  EXAM: PORTABLE CHEST - 1 VIEW  COMPARISON:  Portable exam 0258 hr compared to 02/18/2014  FINDINGS: Enlargement of cardiac silhouette.  Mediastinal contours and pulmonary vascularity normal.  Minimal RIGHT base atelectasis.  Lungs otherwise clear.  No pleural effusion or pneumothorax.  Bones unremarkable.  IMPRESSION: Enlargement of cardiac silhouette with minimal RIGHT basilar atelectasis.  No interval change.   Electronically Signed   By: Lavonia Dana M.D.   On: 02/19/2014 03:06   2D Echo: done 02/20/14, read pending  Admission HPI: [name] is a 63 y.o. female with PMHx of COPD and asthma (not on home O2) secondary to former h/o cigarette smoking (quit 30 years ago), obesity though leads an active lifestyle, resides at home in [place], HTN, GERD, OA, migraine HA, fibromyalgia, anxiety (self-discontinued prescribed SSRI), mild B/L carotid artery disease and h/o AFL s/p DCCV in 2013 at [facility] followed by outpatient successful AFL ablation by Dr. Rayann Heman at that time; completed 2 months of [facility] with LMWH and then it was discontinued in 2013. She has since not been seen by Cardiology and follows only with her PCP. She has reportedly been in good state of health without any angina/dyspnea, palpitations, syncope or interim hospitalizations. In addition, three weeks ago she had poison ivy/oak rash and was prescribed a short course of oral prednisone which she discontinued about two weeks ago after the rash quickly resolved. Today she was doing things in the garden when she suddenly felt a tightness around her throat (very intense) followed by some feeling of radiation of  the discomfort down her chest towards her epigastric area. She took a small dose of 2 pills of her prescribed Xanax (which she never takes) and there was no significant improvement over the course of 90 minutes; she thought that maybe she was having a panic attack and so that's why she tried to take Xanax. At that point she presented to the [facility] ED and was found to be in rapid AFib, negative initial troponin, no ST-T changes on ECG and request  was made for transfer to [facility]. She does state that due to the recent weather, she has noticed an increase in her cough, mucus production but no fevers/chills/rigors. She has not noted any change in exertional symptoms/resting chest discomfort until the acute onset of symptoms this afternoon (approximately 4pm) while working in her garden.  Hospital Course by problem list: Principal Problem:   Atrial fibrillation with RVR Active Problems:   OBESITY, MORBID   Long term (current) use of anticoagulants   Obstructive sleep apnea   COPD (chronic obstructive pulmonary disease)   Hypertension   DM2 (diabetes mellitus, type 2)   Afib with RVR--recurrent episode. Last known hx in 2013 s/p DCCV and ablation with Dr. Rayann Heman. Reports completing course of Lovenox in the past but loss to follow up with Cardiology.  S/p successful DCCV 02/19/14 this admission, remains in sinus rhythm.  Initially was on cardizem gtt with therapeutic lovenox, but transitioned back to home BB (zebeta) and continued on ARB (Losartan) during admission with transition to Xarelto x at least month on discharge.  She will need to follow up with cardiology and pcp as outpatient. Cardiac enzymes x3 were negative, TSH was wnl 2.2 and INR 1.09. CHADS-VASc 3, adjusted stroke rate 3.2%/year.   COPD--thought to have initial mild exacerbation but respiratory status stable during admission with chronic intermittent cough.  Hx of smoking.  Initially started on IV solu-medrol, atrovent nebz, and [place]  oxygen supplementation.  Successfully transitioned off supplemental oxygen, steroids discontinued, and home dulera restarted. CXR showed minimal R basilar atelectasis. May need pulmonary follow up in the future and will need to follow up with her PCP in the mean time.   HTN--home medications: Losartan 100 mg qd and Zebeta 10 mg qd. Initially on IV dilt and Lopressor this admission and transitioned to home BB and continued on ARB this admission after cardioversion on discharge. Restarted home medications on discharge and recommend follow up with PCP and cardiology with titration of medications as needed.    OSA--on CPAP at home but only tolerates nasal prongs.  Refused cpap night prior to discharge.   DM2--new diagnosis this admission. HbA1C 6.5. Discussed with patient who will need to follow up with pcp for further management. Did not start metformin at this time and recommended lifestyle modifications especially diet and portion control given obesity as well.   Discharge Vitals:   BP 155/55  Pulse 71  Temp(Src) 98 F (36.7 C) (Oral)  Resp 21  Ht 5\' 5"  (1.651 m)  Wt 275 lb (124.739 kg)  BMI 45.76 kg/m2  SpO2 98%  Discharge Labs:  Results for orders placed during the hospital encounter of 02/19/14 (from the past 24 hour(s))  TROPONIN I     Status: None   Collection Time    02/19/14  2:00 PM      Result Value Ref Range   Troponin I <0.30  <0.30 ng/mL  CBC     Status: Abnormal   Collection Time    02/20/14  3:38 AM      Result Value Ref Range   WBC 10.0  4.0 - 10.5 K/uL   RBC 3.32 (*) 3.87 - 5.11 MIL/uL   Hemoglobin 9.6 (*) 12.0 - 15.0 g/dL   HCT 30.1 (*) 36.0 - 46.0 %   MCV 90.7  78.0 - 100.0 fL   MCH 28.9  26.0 - 34.0 pg   MCHC 31.9  30.0 - 36.0 g/dL   RDW 16.2 (*) 11.5 - 15.5 %  Platelets 341  150 - 400 K/uL  BASIC METABOLIC PANEL     Status: Abnormal   Collection Time    02/20/14  3:38 AM      Result Value Ref Range   Sodium 135 (*) 137 - 147 mEq/L   Potassium 4.5  3.7 -  5.3 mEq/L   Chloride 98  96 - 112 mEq/L   CO2 20  19 - 32 mEq/L   Glucose, Bld 221 (*) 70 - 99 mg/dL   BUN 19  6 - 23 mg/dL   Creatinine, Ser 1.01  0.50 - 1.10 mg/dL   Calcium 9.1  8.4 - 10.5 mg/dL   GFR calc non Af Amer 57 (*) >90 mL/min   GFR calc Af Amer 66 (*) >90 mL/min  MAGNESIUM     Status: None   Collection Time    02/20/14  3:38 AM      Result Value Ref Range   Magnesium 1.9  1.5 - 2.5 mg/dL  HEMOGLOBIN A1C     Status: Abnormal   Collection Time    02/20/14  3:38 AM      Result Value Ref Range   Hemoglobin A1C 6.6 (*) <5.7 %   Mean Plasma Glucose 143 (*) <117 mg/dL   Signed: Jerene Pitch, MD 02/20/2014, 12:20 PM   Time Spent on Discharge: 35 minutes Services Ordered on Discharge: none Equipment Ordered on Discharge: none

## 2014-02-20 NOTE — Progress Notes (Signed)
Inpatient Diabetes Program Recommendations  AACE/ADA: New Consensus Statement on Inpatient Glycemic Control (2013)  Target Ranges:  Prepandial:   less than 140 mg/dL      Peak postprandial:   less than 180 mg/dL (1-2 hours)      Critically ill patients:  140 - 180 mg/dL   Reason for Visit: No previous history of diabetes noted however A1C=6.5%.  Note that lab glucose is also elevated.  Consider checking CBG's tid with meals and HS with Novolog correction.  Also if this is a new diagnosis, please discuss with patient so that bedside RN can begin survival skill diabetes education.  Called and discussed with Med student.  Will also discuss with RN.    Adah Perl, RN, BC-ADM Inpatient Diabetes Coordinator Pager [phone]

## 2014-02-20 NOTE — Progress Notes (Signed)
PT discharged to home. 2 PIV pulled cath intact. Instructions given to pt who verbalized understanding, departed floor via wheelchair with all belonging.

## 2014-02-23 ENCOUNTER — Encounter (HOSPITAL_COMMUNITY): Payer: Self-pay | Admitting: Cardiology

## 2014-02-27 ENCOUNTER — Ambulatory Visit: Payer: 59 | Admitting: Internal Medicine

## 2014-03-21 ENCOUNTER — Encounter (HOSPITAL_COMMUNITY): Payer: Self-pay | Admitting: Emergency Medicine

## 2014-03-21 ENCOUNTER — Emergency Department (HOSPITAL_COMMUNITY)
Admission: EM | Admit: 2014-03-21 | Discharge: 2014-03-21 | Disposition: A | Discharge status: Home / Self Care | Payer: Medicare Other | Attending: Emergency Medicine | Admitting: Emergency Medicine

## 2014-03-21 DIAGNOSIS — F411 Generalized anxiety disorder: Secondary | ICD-10-CM | POA: Insufficient documentation

## 2014-03-21 DIAGNOSIS — K219 Gastro-esophageal reflux disease without esophagitis: Secondary | ICD-10-CM | POA: Insufficient documentation

## 2014-03-21 DIAGNOSIS — Z79899 Other long term (current) drug therapy: Secondary | ICD-10-CM | POA: Insufficient documentation

## 2014-03-21 DIAGNOSIS — J449 Chronic obstructive pulmonary disease, unspecified: Secondary | ICD-10-CM | POA: Insufficient documentation

## 2014-03-21 DIAGNOSIS — Z7901 Long term (current) use of anticoagulants: Secondary | ICD-10-CM | POA: Insufficient documentation

## 2014-03-21 DIAGNOSIS — Z8739 Personal history of other diseases of the musculoskeletal system and connective tissue: Secondary | ICD-10-CM | POA: Insufficient documentation

## 2014-03-21 DIAGNOSIS — Z8781 Personal history of (healed) traumatic fracture: Secondary | ICD-10-CM | POA: Insufficient documentation

## 2014-03-21 DIAGNOSIS — Z8701 Personal history of pneumonia (recurrent): Secondary | ICD-10-CM | POA: Insufficient documentation

## 2014-03-21 DIAGNOSIS — I4891 Unspecified atrial fibrillation: Secondary | ICD-10-CM

## 2014-03-21 DIAGNOSIS — Z85038 Personal history of other malignant neoplasm of large intestine: Secondary | ICD-10-CM | POA: Insufficient documentation

## 2014-03-21 DIAGNOSIS — G43909 Migraine, unspecified, not intractable, without status migrainosus: Secondary | ICD-10-CM | POA: Insufficient documentation

## 2014-03-21 DIAGNOSIS — Z87891 Personal history of nicotine dependence: Secondary | ICD-10-CM | POA: Insufficient documentation

## 2014-03-21 DIAGNOSIS — J4489 Other specified chronic obstructive pulmonary disease: Secondary | ICD-10-CM | POA: Insufficient documentation

## 2014-03-21 DIAGNOSIS — I10 Essential (primary) hypertension: Secondary | ICD-10-CM | POA: Insufficient documentation

## 2014-03-21 DIAGNOSIS — IMO0001 Reserved for inherently not codable concepts without codable children: Secondary | ICD-10-CM | POA: Insufficient documentation

## 2014-03-21 LAB — CBC
HCT: 33.9 % — ABNORMAL LOW (ref 36.0–46.0)
Hemoglobin: 10.5 g/dL — ABNORMAL LOW (ref 12.0–15.0)
MCH: 27.9 pg (ref 26.0–34.0)
MCHC: 31 g/dL (ref 30.0–36.0)
MCV: 89.9 fL (ref 78.0–100.0)
Platelets: 366 10*3/uL (ref 150–400)
RBC: 3.77 MIL/uL — ABNORMAL LOW (ref 3.87–5.11)
RDW: 16.6 % — ABNORMAL HIGH (ref 11.5–15.5)
WBC: 8 10*3/uL (ref 4.0–10.5)

## 2014-03-21 LAB — BASIC METABOLIC PANEL
BUN: 18 mg/dL (ref 6–23)
CO2: 26 mEq/L (ref 19–32)
Calcium: 9.8 mg/dL (ref 8.4–10.5)
Chloride: 98 mEq/L (ref 96–112)
Creatinine, Ser: 0.97 mg/dL (ref 0.50–1.10)
GFR calc Af Amer: 70 mL/min — ABNORMAL LOW (ref >90)
GFR calc non Af Amer: 60 mL/min — ABNORMAL LOW (ref >90)
Glucose, Bld: 111 mg/dL — ABNORMAL HIGH (ref 70–99)
Potassium: 4.5 mEq/L (ref 3.7–5.3)
Sodium: 140 mEq/L (ref 137–147)

## 2014-03-21 LAB — I-STAT TROPONIN, ED: Troponin i, poc: 0 ng/mL (ref 0.00–0.08)

## 2014-03-21 MED ORDER — SODIUM CHLORIDE 0.9 % IV SOLN
INTRAVENOUS | Status: AC | PRN
  Administered 2014-03-21: 100 mL/h via INTRAVENOUS

## 2014-03-21 MED ORDER — DILTIAZEM HCL 60 MG PO TABS
60.0000 mg | ORAL_TABLET | Freq: Once | ORAL | Status: AC
  Administered 2014-03-21: 60 mg via ORAL
  Filled 2014-03-21 (×2): qty 1

## 2014-03-21 MED ORDER — DILTIAZEM HCL 60 MG PO TABS: 60.0000 mg | ORAL_TABLET | Freq: Every evening | ORAL | Status: DC

## 2014-03-21 MED ORDER — PROPOFOL 10 MG/ML IV BOLUS
0.5000 mg/kg | Freq: Once | INTRAVENOUS | Status: AC
Start: 2014-03-21 — End: 2014-03-21
  Administered 2014-03-21: 80 mg via INTRAVENOUS
  Filled 2014-03-21: qty 20

## 2014-03-21 MED ORDER — FENTANYL CITRATE 0.05 MG/ML IJ SOLN
100.0000 ug | Freq: Once | INTRAMUSCULAR | Status: AC
  Administered 2014-03-21: 100 ug via INTRAVENOUS
  Filled 2014-03-21: qty 2

## 2014-03-21 NOTE — ED Notes (Signed)
Pt c/o heart not feeling right onset today. Pt reports seen in [facility] and here last month for same symptoms. Pt reports symptoms today is worse.

## 2014-03-21 NOTE — ED Notes (Signed)
Patient placed on Zoll, Code cart in room. Airway cart at Door. Placed on O2 @ 2 lpm.

## 2014-03-21 NOTE — Discharge Instructions (Signed)
Take 60 mg of cardizem (diltiazem) in the evening. Keep all of your other medications the same.   Atrial Fibrillation Atrial fibrillation is a type of irregular heart rhythm (arrhythmia). During atrial fibrillation, the upper chambers of the heart (atria) quiver continuously in a chaotic pattern. This causes an irregular and often rapid heart rate.  Atrial fibrillation is the result of the heart becoming overloaded with disorganized signals that tell it to beat. These signals are normally released one at a time by a part of the right atrium called the sinoatrial node. They then travel from the atria to the lower chambers of the heart (ventricles), causing the atria and ventricles to contract and pump blood as they pass. In atrial fibrillation, parts of the atria outside of the sinoatrial node also release these signals. This results in two problems. First, the atria receive so many signals that they do not have time to fully contract. Second, the ventricles, which can only receive one signal at a time, beat irregularly and out of rhythm with the atria.  There are three types of atrial fibrillation:   Paroxysmal. Paroxysmal atrial fibrillation starts suddenly and stops on its own within a week.  Persistent. Persistent atrial fibrillation lasts for more than a week. It may stop on its own or with treatment.  Permanent. Permanent atrial fibrillation does not go away. Episodes of atrial fibrillation may lead to permanent atrial fibrillation. Atrial fibrillation can prevent your heart from pumping blood normally. It increases your risk of stroke and can lead to heart failure.  CAUSES   Heart conditions, including a heart attack, heart failure, coronary artery disease, and heart valve conditions.   Inflammation of the sac that surrounds the heart (pericarditis).  Blockage of an artery in the lungs (pulmonary embolism).  Pneumonia or other infections.  Chronic lung disease.  Thyroid problems,  especially if the thyroid is overactive (hyperthyroidism).  Caffeine, excessive alcohol use, and use of some illegal drugs.   Use of some medicines, including certain decongestants and diet pills.  Heart surgery.   Birth defects.  Sometimes, no cause can be found. When this happens, the atrial fibrillation is called lone atrial fibrillation. The risk of complications from atrial fibrillation increases if you have lone atrial fibrillation and you are age 39 years or older. RISK FACTORS  Heart failure.  Coronary artery disease.  Diabetes mellitus.   High blood pressure (hypertension).   Obesity.   Other arrhythmias.   Increased age. SYMPTOMS   A feeling that your heart is beating rapidly or irregularly.   A feeling of discomfort or pain in your chest.   Shortness of breath.   Sudden light-headedness or weakness.   Getting tired easily when exercising.   Urinating more often than normal (mainly when atrial fibrillation first begins).  In paroxysmal atrial fibrillation, symptoms may start and suddenly stop. DIAGNOSIS  Your health care provider may be able to detect atrial fibrillation when taking your pulse. Your health care provider may have you take a test called an ambulatory electrocardiogram (ECG). An ECG records your heartbeat patterns over a 24-hour period. You may also have other tests, such as:  Transthoracic echocardiogram (TTE). During echocardiography, sound waves are used to evaluate how blood flows through your heart.  Transesophageal echocardiogram (TEE).  Stress test. There is more than one type of stress test. If a stress test is needed, ask your health care provider about which type is best for you.   Chest X-ray exam.  Blood tests.  Computed tomography (CT). TREATMENT  Treatment may include:  Treating any underlying conditions. For example, if you have an overactive thyroid, treating the condition may correct atrial  fibrillation.  Taking medicine. Medicines may be given to control a rapid heart rate or to prevent blood clots, heart failure, or a stroke.  Having a procedure to correct the rhythm of the heart:  Electrical cardioversion. During electrical cardioversion, a controlled, low-energy shock is delivered to the heart through your skin. If you have chest pain, very low blood pressure, or sudden heart failure, this procedure may need to be done as an emergency.  Catheter ablation. During this procedure, heart tissues that send the signals that cause atrial fibrillation are destroyed.  Maze or minimaze procedure. During this surgery, thin lines of heart tissue that carry the abnormal signals are destroyed. The maze procedure is an open-heart surgery. The minimaze procedure is a minimally invasive surgery. This means that small cuts are made to access the heart instead of a large opening.  Pulmonary venous isolation. During this surgery, tissue around the veins that carry blood from the lungs (pulmonary veins) is destroyed. This tissue is thought to carry the abnormal signals. HOME CARE INSTRUCTIONS   Only take medicines that your health care provider approves, and take them as directed. Some medicines can make atrial fibrillation worse or recur.  If blood thinners were prescribed by your health care provider, take them exactly as directed. Too much blood-thinning medicine can cause bleeding. If you take too little, you will not have the needed protection against stroke and other problems.  Perform blood tests at home if directed by your health care provider. Perform blood tests exactly as directed.  Quit smoking if you smoke.  Do not drink alcohol.  Do not drink caffeinated beverages such as coffee, soda, and some teas. You may drink decaffeinated coffee, soda, or tea.   Maintain a healthy weight.Do not use diet pills unless your health care provider approves. They may make heart problems worse.    Follow diet instructions as directed by your health care provider.  Exercise regularly as directed by your health care provider.  Keep all follow-up appointments with your health care provider. PREVENTION  The following substances can cause atrial fibrillation to recur:   Caffeinated beverages.  Alcohol.  Certain medicines, especially those used for breathing problems.  Certain herbs and herbal medicines, such as those containing ephedra or ginseng.  Illegal drugs, such as cocaine and amphetamines. Sometimes medicines are given to prevent atrial fibrillation from recurring. Proper treatment of any underlying condition is also important in helping prevent recurrence.  SEEK MEDICAL CARE IF:  You notice a change in the rate, rhythm, or strength of your heartbeat.  You suddenly begin urinating more frequently.  You tire more easily when exerting yourself or exercising. SEEK IMMEDIATE MEDICAL CARE IF:   You have chest pain, abdominal pain, sweating, or weakness.  You feel nauseous.  You have shortness of breath.  You suddenly have swollen feet and ankles.  You feel dizzy.  Your face or limbs feel numb or weak.  You have a change in your vision or speech. MAKE SURE YOU:   Understand these instructions.  Will watch your condition.  Will get help right away if you are not doing well or get worse. Document Released: 09/11/2005 Document Revised: 09/16/2013 Document Reviewed: 10/22/2012 [facility] Patient Information 2015 [place], [place]. This information is not intended to replace advice given to you by your health care provider. Make  sure you discuss any questions you have with your health care provider.  Electrical Cardioversion Electrical cardioversion is the delivery of a jolt of electricity to change the rhythm of the heart. Sticky patches or metal paddles are placed on the chest to deliver the electricity from a device. This is done to restore a normal rhythm. A  rhythm that is too fast or not regular keeps the heart from pumping well. Electrical cardioversion is done in an emergency if:   There is low or no blood pressure as a result of the heart rhythm.   Normal rhythm must be restored as fast as possible to protect the brain and heart from further damage.   It may save a life. Cardioversion may be done for heart rhythms that are not immediately life-threatening, such as atrial fibrillation or flutter, in which:   The heart is beating too fast or is not regular.   Medicine to change the rhythm has not worked.   It is safe to wait in order to allow time for preparation.  Symptoms of the abnormal rhythm are bothersome.  The risk of stroke and other serious complications can be reduced. LET YOUR CAREGIVER KNOW ABOUT:   All medicines you are taking, including vitamins, herbs, eye drops, creams, and over-the-counter medicines.   Previous problems you or members of your family have had with the use of anesthetics.   Any blood disorders you have.   Previous surgeries you have had.   Medical conditions you have. RISKS AND COMPLICATIONS  Generally, this is a safe procedure. However, as with any procedure, complications can occur. Possible complications include:   Breathing problems related to the anesthetic used.  Cardiac arrest--This risk is rare.  A blood clot that breaks free and travels to other parts of your body. This could cause a stroke or other problems. The risk of this is lowered by use of blood thinning medicine (anticoagulant) prior to the procedure. BEFORE THE PROCEDURE   You may have tests to detect blood clots in your heart and evaluate heart function.  You may start taking anticoagulants so your blood does not clot as easily.   Medicines may be given to help stabilize your heart rate and rhythm. PROCEDURE  You will be given medicine through an IV tube to reduce discomfort and make you sleepy (sedative).    An electrical shock will be delivered. AFTER THE PROCEDURE Your heart rhythm will be watched to make sure it does not change.You may be able to go home within a few hours.  Document Released: 09/01/2002 Document Revised: 07/02/2013 Document Reviewed: 03/26/2013 [facility] Patient Information 2015 [place], [place]. This information is not intended to replace advice given to you by your health care provider. Make sure you discuss any questions you have with your health care provider.

## 2014-03-21 NOTE — ED Provider Notes (Signed)
CSN: [id number]     Arrival date & time 03/21/14  1440 History   First MD Initiated Contact with Patient 03/21/14 1516     Chief Complaint  Patient presents with  . Atrial Fibrillation     (Consider location/radiation/quality/duration/timing/severity/associated sxs/prior Treatment) HPI  63yF with palpitations. Onset a few hours before arrival. Pt has a past hx of afib. Previous ablation. Reoccurrence and had DC cardioversion 1 month ago. Had ECHO 5/28. On lovenox and then xarelto which is still taking. Current symptoms feel similar to previous afib. Chest feels tight and she feels anxious. Mild SOB. Is on bisoprolol and cardizem. Reports compliance with meds.   Past Medical History  Diagnosis Date  . Asthma     Spirometry 1989: FEV1 2.12 (71%) with ratio 82.  HFA 90% after coaching 11-09-10  . COPD (chronic obstructive pulmonary disease)   . GERD (gastroesophageal reflux disease)     EGD with HH / esophagitis 06-30-93  . Osteoarthritis   . DDD (degenerative disc disease)   . Pelvic fracture   . Hiatal hernia   . Migraine   . Anxiety disorder   . Anemia   . Diverticulosis   . Adenomatous colon polyp 1993  . Atrial flutter     Status post cardioversion 2-12 2013. s/p RFCA 5/13 with JAllred (coumadin and amio stopped)   . Essential hypertension, benign   . Pneumonia   . Sleep apnea   . Fibromyalgia   . Lipoma of colon   . Internal and external hemorrhoids without complication    Past Surgical History  Procedure Laterality Date  . Total knee arthroplasty      x2 09/2006  . Rotator cuff repair      right  . A flutter ablation  02/22/2012  . Dilation and curettage of uterus    . Colonoscopy  05/2010, 07/2000  . Esophagogastroduodenoscopy  05/2010, 06/1993  . Cardioversion N/A 02/19/2014    Procedure: CARDIOVERSION;  Surgeon: Dorothy Spark, MD;  Location: [facility] OR;  Service: Cardiovascular;  Laterality: N/A;   Family History  Problem Relation Age of Onset  . Emphysema Father      smoker  . Heart disease Father   . Breast cancer Mother   . Breast cancer Sister   . Heart disease Sister   . Colon cancer Neg Hx    History  Substance Use Topics  . Smoking status: Former Smoker -- 1.00 packs/day for 20 years    Types: Cigarettes    Quit date: 09/25/1984  . Smokeless tobacco: Never Used  . Alcohol Use: No   OB History   Grav Para Term Preterm Abortions TAB SAB Ect Mult Living                 Review of Systems  All systems reviewed and negative, other than as noted in HPI.   Allergies  Ace inhibitors; Epinephrine; Propoxyphene hcl; and Theophyllines  Home Medications   Prior to Admission medications   Medication Sig Start Date End Date Taking? Authorizing Provider  albuterol (VENTOLIN HFA) 108 (90 BASE) MCG/ACT inhaler Inhale 2 puffs into the lungs every 6 (six) hours as needed. For shortness of breath    Historical Provider, MD  bisoprolol (ZEBETA) 10 MG tablet Take 1 tablet (10 mg total) by mouth daily. 10/30/12   Tammy S Parrett, NP  celecoxib (CELEBREX) 200 MG capsule Take 200 mg by mouth daily as needed for mild pain.     Historical Provider, MD  Cholecalciferol (  VITAMIN D3) 5000 UNITS CAPS Take 5,000 Units by mouth daily.    Historical Provider, MD  fluticasone (FLONASE) 50 MCG/ACT nasal spray Place 2 sprays into both nostrils daily as needed for allergies or rhinitis.    Historical Provider, MD  furosemide (LASIX) 20 MG tablet Take 20 mg by mouth daily as needed for edema.     Historical Provider, MD  LORazepam (ATIVAN) 0.5 MG tablet Take 1 tablet (0.5 mg total) by mouth every 12 (twelve) hours as needed for anxiety. 02/20/14   Jerene Pitch, MD  losartan (COZAAR) 100 MG tablet Take 1 tablet (100 mg total) by mouth daily. 10/30/12   Tammy S Parrett, NP  mometasone-formoterol (DULERA) 100-5 MCG/ACT AERO Inhale 2 puffs into the lungs 2 (two) times daily. 10/30/12   Melvenia Needles, NP  Multiple Vitamin (MULTIVITAMIN WITH MINERALS) TABS tablet Take 1 tablet  by mouth daily.    Historical Provider, MD  omeprazole (PRILOSEC) 40 MG capsule Take 40 mg by mouth daily before breakfast.    Historical Provider, MD  rivaroxaban (XARELTO) 20 MG TABS tablet Take 1 tablet (20 mg total) by mouth daily with supper. 02/20/14   Jerene Pitch, MD   BP 136/88  Pulse 146  Temp(Src) 97.8 F (36.6 C) (Oral)  Resp 20  Ht 5' 5.5" (1.664 m)  Wt 280 lb (127.007 kg)  BMI 45.87 kg/m2  SpO2 98% Physical Exam  Nursing note and vitals reviewed. Constitutional: She appears well-developed and well-nourished. No distress.  Sitting in bed. Mildly anxious appearing. Obese.   HENT:  Head: Normocephalic and atraumatic.  Eyes: Conjunctivae are normal. Right eye exhibits no discharge. Left eye exhibits no discharge.  Neck: Neck supple.  Cardiovascular: Normal heart sounds.  Exam reveals no gallop and no friction rub.   No murmur heard. Tachy. irreg irreg.   Pulmonary/Chest: Effort normal and breath sounds normal. No respiratory distress.  Abdominal: Soft. She exhibits no distension. There is no tenderness.  Musculoskeletal: She exhibits no edema and no tenderness.  Neurological: She is alert.  Skin: Skin is warm and dry.  Psychiatric: She has a normal mood and affect. Her behavior is normal. Thought content normal.    ED Course  CARDIOVERSION Date/Time: 03/21/2014 4:20 PM Performed by: Virgel Manifold Authorized by: Virgel Manifold Consent: Verbal consent obtained. written consent obtained. Risks and benefits: risks, benefits and alternatives were discussed Consent given by: patient Required items: required blood products, implants, devices, and special equipment available Patient identity confirmed: verbally with patient, arm band and provided demographic data Time out: Immediately prior to procedure a "time out" was called to verify the correct patient, procedure, equipment, support staff and site/side marked as required. Patient sedated: yes Sedation type: moderate  (conscious) sedation Sedatives: fentanyl, propofol and see MAR for details Analgesia: fentanyl Vitals: Vital signs were monitored during sedation. Cardioversion basis: elective Pre-procedure rhythm: atrial fibrillation Patient position: patient was placed in a supine position Chest area: chest area exposed Electrodes: pads Electrodes placed: anterior-posterior Number of attempts: 1 Attempt 1 mode: synchronous Attempt 1 shock (in Joules): 100 Attempt 1 outcome: conversion to normal sinus rhythm Post-procedure rhythm: normal sinus rhythm Comments: Brief o2 desaturation which quickly responded to jaw thrust and supplemental o2.     Procedural Sedation  Preprocedure  Pre-anesthesia/induction confirmation of laterality/correct procedure site including "time-out."  Provider confirms review of the nurses' note, allergies, medications, pertinent labs, PMH, pre-induction vital signs, pulse oximetry, pain level, and ECG (as applicable), and patient condition satisfactory for commencing with  order for sedation and procedure.   Date/Time: 03/21/2014 4:20 PM Performed by: Virgel Manifold Authorized by: Virgel Manifold Consent: Verbal consent obtained. written consent obtained. Risks and benefits: risks, benefits and alternatives were discussed Consent given by: patient Required items: required blood products, implants, devices, and special equipment available Patient identity confirmed: verbally with patient, arm band and provided demographic data Time out: Immediately prior to procedure a "time out" was called to verify the correct patient, procedure, equipment, support staff and site/side marked as required. Patient sedated: yes Sedation type: moderate (conscious) sedation.   100 mcg Fentanyl 80 mg propofol  Patient tolerated procedure and procedural sedation component as expected without apparent immediate complications except for brief oxygen desaturation which responded to non-invasive  measures.  Physician confirms procedural medication orders as administered, patient was assessed by physician post-procedure, and confirms post-sedation plan of care and disposition.  (including critical care time) Labs Review Labs Reviewed  CBC - Abnormal; Notable for the following:    RBC 3.77 (*)    Hemoglobin 10.5 (*)    HCT 33.9 (*)    RDW 16.6 (*)    All other components within normal limits  BASIC METABOLIC PANEL - Abnormal; Notable for the following:    Glucose, Bld 111 (*)    GFR calc non Af Amer 60 (*)    GFR calc Af Amer 70 (*)    All other components within normal limits  I-STAT TROPOININ, ED    Imaging Review No results found.   EKG Interpretation   Date/Time:  Saturday March 21 2014 14:56:29 EDT Ventricular Rate:  143 PR Interval:    QRS Duration: 68 QT Interval:  276 QTC Calculation: 425 R Axis:   11 Text Interpretation:  Atrial flutter with rapid ventricular response  Septal infarct , age undetermined Abnormal ECG Confirmed by Russell  MD,  STEPHEN ([id number]) on 03/21/2014 4:50:08 PM      MDM   Final diagnoses:  Atrial fibrillation with rapid ventricular response    63yF with afib and RVR. Hx of same. Recent electro cardioversion. Had been on lovenox and then xarelto since. Had ECHO 5/28. Discussed attempting electrocardioversion again versus rate control and possible conversion with diltiazem. Pt electing for electrocardioversion.  Cardioversion successful. Discussed with Dr Bronson Ing. Recommended adding 60mg  cardizem in evening to current long acting cardizem. Follow-up with Dr Rayann Heman. Return precautions discussed.     Virgel Manifold, MD 03/25/14 [phone]

## 2014-03-23 ENCOUNTER — Telehealth: Payer: Self-pay | Admitting: Internal Medicine

## 2014-03-23 NOTE — Telephone Encounter (Signed)
New Message:  Pt is requesting to be worked in for a sooner appt.. States she went to the hospital over the weekend to have her heart shocked... PT also staets they increased her cardiazem

## 2014-03-23 NOTE — Telephone Encounter (Signed)
Ok to keep appointment on July 9.  She will keep this  She feels okay and does not see the need to be moved up as 7/9 is next week

## 2014-03-24 ENCOUNTER — Ambulatory Visit: Payer: 59 | Admitting: Internal Medicine

## 2014-03-24 ENCOUNTER — Encounter (INDEPENDENT_AMBULATORY_CARE_PROVIDER_SITE_OTHER): Payer: Self-pay

## 2014-03-24 ENCOUNTER — Ambulatory Visit (INDEPENDENT_AMBULATORY_CARE_PROVIDER_SITE_OTHER): Payer: Medicare Other | Admitting: Internal Medicine

## 2014-03-24 ENCOUNTER — Encounter: Payer: Self-pay | Admitting: Internal Medicine

## 2014-03-24 VITALS — BP 142/74 | HR 70 | Temp 98.1°F | Wt 263.0 lb

## 2014-03-24 DIAGNOSIS — J45909 Unspecified asthma, uncomplicated: Secondary | ICD-10-CM

## 2014-03-24 DIAGNOSIS — I4891 Unspecified atrial fibrillation: Secondary | ICD-10-CM

## 2014-03-24 NOTE — Patient Instructions (Signed)
Call us with formulatary alternatives to dulera 100 = symbicort 80  Alternatives to ventolin = proair, xopenox  Pepcid ac 20 mg at bedtime whenever coughing/ wheezing at night   If you are satisfied with your treatment plan,  let your doctor know and he/she can either refill your medications or you can return here when your prescription runs out.     If in any way you are not 100% satisfied,  please tell us.  If 100% better, tell your friends!  Pulmonary follow up is as needed

## 2014-03-24 NOTE — Progress Notes (Signed)
Subjective:    Patient ID: [name], female    DOB: [birth year]    MRN: [id number]    Brief patient profile:  56 yowf quit smoking in 1986 with history of Asthma and GERD.     History of Present Illness  July 19, 2009--Presents for an acute office visit.x 2d. tripped in bathroom, fell against toilet seat, hitting right ribs on toilet seat. Pain w/ inspiration and very tender to touch along right lateral ribs. No skinbreak. No bruising noted. Has had recent bronchitis tx w/ doxcycline/pred last week, better w/ decreased cough adn congestion. No discolored mucus or fever. .Cont on celebrex  Warm heat to ribs.  Vicodin 1-2 every 4-6 hrs as needed pain.   November 09, 2010 acute ov Cough and wheezing x 2 months despite symbicort 80 on multiple oil based vitamins and ppi daily but not ac assoc with nasal congestion but no purulent secretions at this time.  1) Symbicort 160 2 puffs first thing in am and 2 puffs again in pm about 12 hours later  2) Ventolin is only to be used if needed  3) Prednsione 10 mg 4 each am x 2days, 2x2days, 1x2days and stop  4) Omeprazole 40 mg Take one 30-60 min before first meal of the day also take Pepcid 20 mg one at bedtime as long as flare in resp symptoms  5) use flutter whenever coughing  6) Mucinex dm 2 every 12 hours if cough  7) GERD (REFLUX) diet  12/28/2010 ov cc cough and breathing much better on rx.  Pt denies any significant sore throat, dysphagia, itching, sneezing,  nasal congestion or excess/ purulent secretions,  fever, chills, sweats, unintended wt loss, pleuritic or exertional cp, hempoptysis, orthopnea pnd or leg swelling.    Also denies any obvious fluctuation of symptoms with weather or environmental changes or other aggravating or alleviating factors.  No noct co's or need for rescue   12/28/2010 ov much better on symbicort, still pnds rec Ok to try symbicort 160 1-2 every 12 hours For drainage try chlortrimeton 4mg  one every 6  hours  Seen by Dr Halford Chessman 09/30/12 for AB exac > rx pred   10/09/2012 f/u ov/Wert cc doing ok until xmas with exposure to sick people while on acei and coreg with persistent  flare of ab since and sob some better p prednisone but still severe cough with min thick yellow mucus. Some better p albuterol. >changed coreg to bystolic , symbicort to dulera  And change ACEi to ARB   10/30/2012 Follow up  Patient returns for a two-week followup and medication review. Patient was seen last visit with persistent cough and wheezing. Patient was changed off of her ACE inhibitor her beta blocker Coreg was changed over to [employer]. The patient was changed from Symbicort to delay or a. Patient returns today reporting that she is much improved. Cough and wheezing are decreased essentially. She feels that she can take in a deep breath and has poor airflow. She does complain that she has sinus congestion, drainage, and sinus pain, and pressure with a sinus headache. Patient denies any fever or hemoptysis, orthopnea, PND, or leg swelling. Patient has brought her drug formulary in today for several her medications are on tier 3 or 4  medications. She requests meds be changed to tier 1 or 2 to have more for affordability  rec Begin Losartan 100mg  daily (In place of Benicar)  Avoid ACE Inhibitors in future due to cough .  Begin  Bisoprolol 10mg  daily ( in place Bystolic )  Continue on Dulera 2 puffs Twice daily  -brush/rinse /gargle after inhaler use.  Omnicef 300mg  Twice daily  For 10 days  Mucinex Twice daily  As needed  Congestion.  Fluids and rest.  Saline nasal rinses As needed     03/24/2014 f/u ov/Wert re: asthma/ gerd Chief Complaint  Patient presents with  . Follow-up    Pt states that she is here for refill on Dulera. She states that her heart has went out of rythym x 2 since last visit vist.  She uses rescue inhaler approx 2 x per wk.   out of dulera x one week/needing more rescue since then for cough and  subj wheeze with doe x more than slow adls  No obvious day to day or daytime variabilty or assoc  cp or chest tightness,  overt sinus or hb symptoms. No unusual exp hx or h/o childhood pna/ asthma or knowledge of premature birth.  Sleeping ok without nocturnal  or early am exacerbation  of respiratory  c/o's or need for noct saba. Also denies any obvious fluctuation of symptoms with weather or environmental changes or other aggravating or alleviating factors except as outlined above   Current Medications, Allergies, Complete Past Medical History, Past Surgical History, Family History, and Social History were reviewed in [employer] record.  ROS  The following are not active complaints unless bolded sore throat, dysphagia, dental problems, itching, sneezing,  nasal congestion or excess/ purulent secretions, ear ache,   fever, chills, sweats, unintended wt loss, pleuritic or exertional cp, hemoptysis,  orthopnea pnd or leg swelling, presyncope, palpitations, heartburn, abdominal pain, anorexia, nausea, vomiting, diarrhea  or change in bowel or urinary habits, change in stools or urine, dysuria,hematuria,  rash, arthralgias, visual complaints, headache, numbness weakness or ataxia or problems with walking or coordination,  change in mood/affect or memory.             Allergies 1) ! Hubbard Robinson  2) ! Epinephrine  3) ! 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  NECK: Supple w/ fair ROM; no JVD; normal carotid impulses w/o bruits; no thyromegaly or nodules palpated; no lymphadenopathy.  CHEST CTA bialterally , no wheezing  HEART: RRR, no m/r/g heard  ABDOMEN: Soft & nt; nml bowel sounds; no organomegaly or masses detected.  EXT: Warm bilat, no calf pain, edema, clubbing, pulses  intact, arthritic changes in hands.     Assessment & Plan:

## 2014-03-25 ENCOUNTER — Encounter: Payer: Self-pay | Admitting: Internal Medicine

## 2014-03-25 NOTE — Assessment & Plan Note (Signed)
-   Spirometry 1989: FEV1 2.12 ( 71%) with ratio 82  - HFA 100% 10/09/2012  -PFT's nl 12/28/2010 x ERV 22%  DDX of  difficult airways management all start with A and  include Adherence, Ace Inhibitors, Acid Reflux, Active Sinus Disease, Alpha 1 Antitripsin deficiency, Anxiety masquerading as Airways dz,  ABPA,  allergy(esp in young), Aspiration (esp in elderly), Adverse effects of DPI,  Active smokers, plus two Bs  = Bronchiectasis and Beta blocker use..and one C= CHF  Adherence is always the initial "prime suspect" and is a multilayered concern that requires a "trust but verify" approach in every patient - starting with knowing how to use medications, especially inhalers, correctly, keeping up with refills and understanding the fundamental difference between maintenance and prns vs those medications only taken for a very short course and then stopped and not refilled.  - reviewed issues with formulary restrictions and various equivalent meds - The proper method of use, as well as anticipated side effects, of a metered-dose inhaler are discussed and demonstrated to the patient. Improved effectiveness after extensive coaching during this visit to a level of approximately  90%   - ? Acid (or non-acid) GERD > always difficult to exclude as up to 75% of pts in some series report no assoc GI/ Heartburn symptoms and she has gained sign wt since prev visit placing her more at risk> rec continue max (24h)  acid suppression and diet restrictions/ reviewed and instructions given in writing.   ? Anxiety > typically dx of exclusion but much higher on her list     Each maintenance medication was reviewed in detail including most importantly the difference between maintenance and as needed and under what circumstances the prns are to be used.  Please see instructions for details which were reviewed in writing and the patient given a copy.

## 2014-03-25 NOTE — Assessment & Plan Note (Signed)
Resolved at present - reviewed need to be compliant with ICS/LABA to minimize dep on saba which is much more prone to cause RVR

## 2014-04-02 ENCOUNTER — Ambulatory Visit: Payer: Medicare Other | Admitting: Internal Medicine

## 2014-04-06 ENCOUNTER — Ambulatory Visit (INDEPENDENT_AMBULATORY_CARE_PROVIDER_SITE_OTHER): Payer: Medicare Other | Admitting: Internal Medicine

## 2014-04-06 ENCOUNTER — Encounter: Payer: Self-pay | Admitting: Internal Medicine

## 2014-04-06 VITALS — BP 130/70 | HR 69 | Ht 65.0 in | Wt 235.0 lb

## 2014-04-06 DIAGNOSIS — G4733 Obstructive sleep apnea (adult) (pediatric): Secondary | ICD-10-CM

## 2014-04-06 DIAGNOSIS — I10 Essential (primary) hypertension: Secondary | ICD-10-CM

## 2014-04-06 DIAGNOSIS — I4891 Unspecified atrial fibrillation: Secondary | ICD-10-CM

## 2014-04-06 DIAGNOSIS — R0602 Shortness of breath: Secondary | ICD-10-CM

## 2014-04-06 DIAGNOSIS — G4730 Sleep apnea, unspecified: Secondary | ICD-10-CM

## 2014-04-06 DIAGNOSIS — Z7901 Long term (current) use of anticoagulants: Secondary | ICD-10-CM

## 2014-04-06 MED ORDER — RIVAROXABAN 20 MG PO TABS: 20.0000 mg | ORAL_TABLET | Freq: Every day | ORAL | Status: DC

## 2014-04-06 MED ORDER — FLECAINIDE ACETATE 50 MG PO TABS: 50.0000 mg | ORAL_TABLET | Freq: Two times a day (BID) | ORAL | Status: DC

## 2014-04-06 NOTE — Patient Instructions (Signed)
Your physician recommends that you schedule a follow-up appointment in: 4 weeks with Roderic Palau, NP   Your physician has recommended you make the following change in your medication:  1) Start Flecainide 50mg  twice daily  You have been referred to Dr Lauro Franklin apnea  Your physician has requested that you have an exercise tolerance test. For further information please visit [email or web address]. Please also follow instruction sheet, as given.

## 2014-04-07 ENCOUNTER — Other Ambulatory Visit: Payer: Self-pay | Admitting: *Deleted

## 2014-04-07 MED ORDER — RIVAROXABAN 20 MG PO TABS: 20.0000 mg | ORAL_TABLET | Freq: Every day | ORAL | Status: DC

## 2014-04-09 DIAGNOSIS — R0602 Shortness of breath: Secondary | ICD-10-CM | POA: Insufficient documentation

## 2014-04-09 NOTE — Progress Notes (Signed)
Primary Care Physician: [name]., MD Referring Physician:  Dr Martin[place]   [name] is a 63 y.o. female with a h/o atrial flutter s/p prior ablation who now presents with symptomatic persistent atrial fibrillation.  She reports initially being diagnosed with atrial flutter 2/13 after presenting to [facility] with tachypalpitations and SOB.   She underwent atrial flutter ablation by me in 2013.  She has done very well, without any difficulty until 5/15 when she presented with afib with RVR.  She required cardioversion by Dr Meda Coffee.  She is referred for further management.  She continues to have occasional symptoms which she feels represents atrial fibrillation.  She has occasional SOB and fatigue.  She has been placed on diltiazem and is adequately anticoagulated with xarelto.   The patient is tolerating medications without difficulties and is otherwise without complaint today.   Past Medical History  Diagnosis Date  . Asthma     Spirometry 1989: FEV1 2.12 (71%) with ratio 82.  HFA 90% after coaching 11-09-10  . COPD (chronic obstructive pulmonary disease)   . GERD (gastroesophageal reflux disease)     EGD with HH / esophagitis 06-30-93  . Osteoarthritis   . DDD (degenerative disc disease)   . Pelvic fracture   . Hiatal hernia   . Migraine   . Anxiety disorder   . Anemia   . Diverticulosis   . Adenomatous colon polyp 1993  . Atrial flutter     Status post cardioversion 2-12 2013. s/p RFCA 5/13 with JAllred (coumadin and amio stopped)   . Essential hypertension, benign   . Pneumonia   . Sleep apnea   . Fibromyalgia   . Lipoma of colon   . Internal and external hemorrhoids without complication    Past Surgical History  Procedure Laterality Date  . Total knee arthroplasty      x2 09/2006  . Rotator cuff repair      right  . A flutter ablation  02/22/2012  . Dilation and curettage of uterus    . Colonoscopy  05/2010, 07/2000  . Esophagogastroduodenoscopy   05/2010, 06/1993  . Cardioversion N/A 02/19/2014    Procedure: CARDIOVERSION;  Surgeon: Dorothy Spark, MD;  Location: [facility] OR;  Service: Cardiovascular;  Laterality: N/A;    Current Outpatient Prescriptions  Medication Sig Dispense Refill  . acetaminophen (TYLENOL) 500 MG tablet Take 1,000-1,500 mg by mouth 2 (two) times daily as needed for headache (migraines).      Marland Kitchen albuterol (VENTOLIN HFA) 108 (90 BASE) MCG/ACT inhaler Inhale 2 puffs into the lungs every 6 (six) hours as needed for wheezing or shortness of breath.       Marland Kitchen aspirin-acetaminophen-caffeine (EXCEDRIN MIGRAINE) 250-250-65 MG per tablet Take 2 tablets by mouth 3 (three) times daily as needed for headache or migraine.      . bisoprolol (ZEBETA) 10 MG tablet Take 1 tablet (10 mg total) by mouth daily.  30 tablet  2  . budesonide-formoterol (SYMBICORT) 160-4.5 MCG/ACT inhaler Inhale 2 puffs into the lungs 2 (two) times daily.      . celecoxib (CELEBREX) 200 MG capsule Take 200 mg by mouth daily.       Marland Kitchen diltiazem (CARDIZEM CD) 180 MG 24 hr capsule Take 180 mg by mouth daily.      Marland Kitchen diltiazem (CARDIZEM) 60 MG tablet Take 1 tablet (60 mg total) by mouth every evening.  30 tablet  0  . furosemide (LASIX) 20 MG tablet Take 20 mg by  mouth daily as needed for edema.       Marland Kitchen losartan (COZAAR) 100 MG tablet Take 1 tablet (100 mg total) by mouth daily.  30 tablet  2  . omeprazole (PRILOSEC) 40 MG capsule Take 40 mg by mouth daily before breakfast.      . vitamin D, CHOLECALCIFEROL, 400 UNITS tablet Take 400 Units by mouth daily with supper.      . flecainide (TAMBOCOR) 50 MG tablet Take 1 tablet (50 mg total) by mouth 2 (two) times daily.  180 tablet  3  . rivaroxaban (XARELTO) 20 MG TABS tablet Take 1 tablet (20 mg total) by mouth daily with supper.  30 tablet  11   No current facility-administered medications for this visit.    Allergies  Allergen Reactions  . Shellfish Allergy Hives and Shortness Of Breath  . Ace Inhibitors Cough  .  Epinephrine Other (See Comments)    Pallpitations during dental procedures  . Propoxyphene Hcl Nausea And Vomiting  . Theophyllines Other (See Comments)    Heart races and pounds    History   Social History  . Marital Status: Widowed    Spouse Name: N/A    Number of Children: 0  . Years of Education: N/A   Occupational History  . Accounting tech at [employer] History Main Topics  . Smoking status: Former Smoker -- 1.00 packs/day for 20 years    Types: Cigarettes    Quit date: 09/25/1984  . Smokeless tobacco: Never Used  . Alcohol Use: No  . Drug Use: No  . Sexual Activity: Not Currently   Other Topics Concern  . Not on file   Social History Narrative  . No narrative on file    Family History  Problem Relation Age of Onset  . Emphysema Father     smoker  . Heart disease Father   . Breast cancer Mother   . Breast cancer Sister   . Heart disease Sister   . Colon cancer Neg Hx     ROS- All systems are reviewed and negative except as per the HPI above  Physical Exam: Filed Vitals:   04/06/14 1506  BP: 130/70  Pulse: 69  Height: 5\' 5"  (1.651 m)  Weight: 235 lb (106.595 kg)    GEN- The patient is overweight appearing, alert and oriented x 3 today.   Head- normocephalic, atraumatic Eyes-  Sclera clear, conjunctiva pink Ears- hearing intact Oropharynx- clear Neck- supple, no JVP Lymph- no cervical lymphadenopathy Lungs- Clear with a prolonged expiratory phase Heart- Regular rate and rhythm, no murmurs, rubs or gallops, PMI not laterally displaced GI- soft, NT, ND, + BS Extremities- no clubbing, cyanosis, or edema MS- diffuse muscle atrophy Skin- diffuse small ecchymosis Psych- euthymic mood, full affect Neuro- strength and sensation are intact  EKG today reveals sinus rhythm Epic records including recent hospital records and my notes from 2013 are reviewed   Assessment and Plan:  1. Atrial fibrillation The patient  presents with recent afib for which she was cardioverted.  She is appropriately anticoagulated with xarelto (chads2vasc score is at least 3).  I will add flecainide 50mg  BID today.  This can be increased to 100mg  BID if her afib returns.  She will return for follow-up in 4 weeks in the afib clinic.  2. OSA The importance of compliance with management is discussed today She will be referred to Dr Gwenette Greet for mangement  3. SOB multifactoral Likely due to  lung disease--> she follows with Dr Melvyn Novas I will obtain a GXT to evaluate for CAD as a possible cause for worsening SOB  4. Obesity The importance of weight reduction was discussed today Body mass index is 39.11 kg/(m^2).  Return in 4 weeks for further management

## 2014-04-13 ENCOUNTER — Telehealth: Payer: Self-pay

## 2014-04-13 ENCOUNTER — Other Ambulatory Visit: Payer: Self-pay

## 2014-04-13 MED ORDER — RIVAROXABAN 20 MG PO TABS: 20.0000 mg | ORAL_TABLET | Freq: Every day | ORAL | Status: DC

## 2014-04-13 NOTE — Telephone Encounter (Signed)
Patient called stating that her xarelto was not call in to [facility], but it was called in on 04/07/14. But patient needed a PA for her xarelto. I called for PA and it was approved until 04/14/15. REF # [id number]

## 2014-04-14 ENCOUNTER — Encounter: Payer: Self-pay | Admitting: Adult Health

## 2014-04-14 ENCOUNTER — Ambulatory Visit (INDEPENDENT_AMBULATORY_CARE_PROVIDER_SITE_OTHER): Payer: Medicare Other | Admitting: Adult Health

## 2014-04-14 VITALS — BP 132/62 | HR 63 | Temp 98.5°F | Ht 65.0 in | Wt 256.0 lb

## 2014-04-14 DIAGNOSIS — J45909 Unspecified asthma, uncomplicated: Secondary | ICD-10-CM

## 2014-04-14 MED ORDER — LEVALBUTEROL TARTRATE 45 MCG/ACT IN AERO: 2.0000 | INHALATION_SPRAY | RESPIRATORY_TRACT | Status: DC | PRN

## 2014-04-14 NOTE — Patient Instructions (Signed)
Mucinex DM Twice daily  As needed  Cough/congestion  Zyrtec 10mg  At bedtime  As needed  Drainage  Discuss with cardiology regarding your new meds Cool compresses to skin.  Continue with sensitive skin lotions, detergents, etc.  Caution with sun exposure, no tanning beds.  Call if cough worsens or develops discolored mucus.  Please contact office for sooner follow up if symptoms do not improve or worsen or seek emergency care

## 2014-04-14 NOTE — Assessment & Plan Note (Signed)
Compensated w/out flare  Mild URI  Suspect skin issues are related to dry skin and photosenstivity w/ meds and tanning bed   Plan  Mucinex DM Twice daily  As needed  Cough/congestion  Zyrtec 10mg  At bedtime  As needed  Drainage  Discuss with cardiology regarding your new meds Cool compresses to skin.  Continue with sensitive skin lotions, detergents, etc.  Caution with sun exposure, no tanning beds.  Call if cough worsens or develops discolored mucus.  Please contact office for sooner follow up if symptoms do not improve or worsen or seek emergency care

## 2014-04-14 NOTE — Progress Notes (Signed)
Subjective:    Patient ID: [name], female    DOB: [birth year]    MRN: [id number]    Brief patient profile:  56 yowf quit smoking in 1986 with history of Asthma and GERD.     History of Present Illness  July 19, 2009--Presents for an acute office visit.x 2d. tripped in bathroom, fell against toilet seat, hitting right ribs on toilet seat. Pain w/ inspiration and very tender to touch along right lateral ribs. No skinbreak. No bruising noted. Has had recent bronchitis tx w/ doxcycline/pred last week, better w/ decreased cough adn congestion. No discolored mucus or fever. .Cont on celebrex  Warm heat to ribs.  Vicodin 1-2 every 4-6 hrs as needed pain.   November 09, 2010 acute ov Cough and wheezing x 2 months despite symbicort 80 on multiple oil based vitamins and ppi daily but not ac assoc with nasal congestion but no purulent secretions at this time.  1) Symbicort 160 2 puffs first thing in am and 2 puffs again in pm about 12 hours later  2) Ventolin is only to be used if needed  3) Prednsione 10 mg 4 each am x 2days, 2x2days, 1x2days and stop  4) Omeprazole 40 mg Take one 30-60 min before first meal of the day also take Pepcid 20 mg one at bedtime as long as flare in resp symptoms  5) use flutter whenever coughing  6) Mucinex dm 2 every 12 hours if cough  7) GERD (REFLUX) diet  12/28/2010 ov cc cough and breathing much better on rx.  Pt denies any significant sore throat, dysphagia, itching, sneezing,  nasal congestion or excess/ purulent secretions,  fever, chills, sweats, unintended wt loss, pleuritic or exertional cp, hempoptysis, orthopnea pnd or leg swelling.    Also denies any obvious fluctuation of symptoms with weather or environmental changes or other aggravating or alleviating factors.  No noct co's or need for rescue   12/28/2010 ov much better on symbicort, still pnds rec Ok to try symbicort 160 1-2 every 12 hours For drainage try chlortrimeton 4mg  one every 6  hours  Seen by Dr Halford Chessman 09/30/12 for AB exac > rx pred   10/09/2012 f/u ov/Wert cc doing ok until xmas with exposure to sick people while on acei and coreg with persistent  flare of ab since and sob some better p prednisone but still severe cough with min thick yellow mucus. Some better p albuterol. >changed coreg to bystolic , symbicort to dulera  And change ACEi to ARB   10/30/2012 Follow up  Patient returns for a two-week followup and medication review. Patient was seen last visit with persistent cough and wheezing. Patient was changed off of her ACE inhibitor her beta blocker Coreg was changed over to [employer]. The patient was changed from Symbicort to delay or a. Patient returns today reporting that she is much improved. Cough and wheezing are decreased essentially. She feels that she can take in a deep breath and has poor airflow. She does complain that she has sinus congestion, drainage, and sinus pain, and pressure with a sinus headache. Patient denies any fever or hemoptysis, orthopnea, PND, or leg swelling. Patient has brought her drug formulary in today for several her medications are on tier 3 or 4  medications. She requests meds be changed to tier 1 or 2 to have more for affordability  rec Begin Losartan 100mg  daily (In place of Benicar)  Avoid ACE Inhibitors in future due to cough .  Begin  Bisoprolol 10mg  daily ( in place Bystolic )  Continue on Dulera 2 puffs Twice daily  -brush/rinse /gargle after inhaler use.  Omnicef 300mg  Twice daily  For 10 days  Mucinex Twice daily  As needed  Congestion.  Fluids and rest.  Saline nasal rinses As needed     03/24/2014 f/u ov/Wert re: asthma/ gerd Chief Complaint  Patient presents with  . Follow-up    Pt states that she is here for refill on Dulera. She states that her heart has went out of rythym x 2 since last visit vist.  She uses rescue inhaler approx 2 x per wk.   out of dulera x one week/needing more rescue since then for cough and  subj wheeze with doe x more than slow adls  >no changes   0/98/1191 Acute OV  Complains of increased cough, congestion, minimally productive. No fever,  Has trouble with itching and dry skin. Is worried her new heart meds are causing her to itch. Has been using tanning bed at home daily .  We discussed sun /tanning bed exposure.        Current Medications, Allergies, Complete Past Medical History, Past Surgical History, Family History, and Social History were reviewed in [employer] record.  ROS  The following are not active complaints unless bolded sore throat, dysphagia, dental problems, itching, sneezing,  nasal congestion or excess/ purulent secretions, +PND ,  ear ache,   fever, chills, sweats, unintended wt loss, pleuritic or exertional cp, hemoptysis,  orthopnea pnd or leg swelling, presyncope, palpitations, heartburn, abdominal pain, anorexia, nausea, vomiting, diarrhea  or change in bowel or urinary habits, change in stools or urine, dysuria,hematuria,  rash, arthralgias, visual complaints, headache, numbness weakness or ataxia or problems with walking or coordination,  change in mood/affect or memory.             Allergies 1) ! Hubbard Robinson  2) ! Epinephrine  3) ! 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  NECK: Supple w/ fair ROM; no JVD; normal carotid impulses w/o bruits; no thyromegaly or nodules palpated; no lymphadenopathy.  CHEST CTA bialterally , no wheezing  HEART: RRR, no m/r/g heard  ABDOMEN: Soft & nt; nml bowel sounds; no organomegaly or masses detected.  EXT: Warm bilat, no calf pain, edema, clubbing, pulses intact, arthritic changes in hands.  Skin , no rash, dry skin  Assessment & Plan:

## 2014-04-14 NOTE — Addendum Note (Signed)
Addended by: Maurice March on: 04/14/2014 03:16 PM   Modules accepted: Orders, Medications

## 2014-04-16 ENCOUNTER — Telehealth: Payer: Self-pay | Admitting: *Deleted

## 2014-04-16 ENCOUNTER — Telehealth (HOSPITAL_COMMUNITY): Payer: Self-pay

## 2014-04-16 MED ORDER — ALBUTEROL SULFATE HFA 108 (90 BASE) MCG/ACT IN AERS: 2.0000 | INHALATION_SPRAY | Freq: Four times a day (QID) | RESPIRATORY_TRACT | Status: DC | PRN

## 2014-04-16 NOTE — Telephone Encounter (Signed)
Pt given rx for the xopenex HFA---proair HFA is preferred.  MW please advise if ok to send in the proair hfa for the pt or do you want to initiate the PA for the xopenex HFA.

## 2014-04-16 NOTE — Telephone Encounter (Signed)
proair is fine 

## 2014-04-16 NOTE — Telephone Encounter (Signed)
Called spoke with pt. Aware we are changing her to proair. RX sent. Nothing further needed

## 2014-04-16 NOTE — Telephone Encounter (Signed)
Encounter complete. 

## 2014-04-17 ENCOUNTER — Telehealth (HOSPITAL_COMMUNITY): Payer: Self-pay

## 2014-04-21 ENCOUNTER — Telehealth: Payer: Self-pay | Admitting: Internal Medicine

## 2014-04-21 ENCOUNTER — Ambulatory Visit (HOSPITAL_COMMUNITY)
Admission: RE | Admit: 2014-04-21 | Discharge: 2014-04-21 | Disposition: A | Discharge status: Home / Self Care | Payer: Medicare Other | Source: Ambulatory Visit | Attending: Cardiovascular Disease | Admitting: Cardiovascular Disease

## 2014-04-21 DIAGNOSIS — I4891 Unspecified atrial fibrillation: Secondary | ICD-10-CM

## 2014-04-21 DIAGNOSIS — R0602 Shortness of breath: Secondary | ICD-10-CM

## 2014-04-21 NOTE — Telephone Encounter (Signed)
Encounter complete. 

## 2014-04-21 NOTE — Telephone Encounter (Signed)
New message    Patient came to appt that was schedule at [facility] for exercise tolerance test@ 9:30 am     [facility] explain [facility] that she has two knee replacement .   Patient is asking to be reschedule for a nuclear test.

## 2014-04-27 NOTE — Telephone Encounter (Signed)
Order was placed for lexiscan myoview.

## 2014-04-27 NOTE — Telephone Encounter (Signed)
Please reschedule for lexiscan myoview

## 2014-05-06 ENCOUNTER — Ambulatory Visit: Payer: Medicare Other | Admitting: Internal Medicine

## 2014-05-06 ENCOUNTER — Telehealth: Payer: Self-pay | Admitting: Internal Medicine

## 2014-05-06 MED ORDER — BUDESONIDE-FORMOTEROL FUMARATE 80-4.5 MCG/ACT IN AERO: 2.0000 | INHALATION_SPRAY | Freq: Two times a day (BID) | RESPIRATORY_TRACT | Status: DC

## 2014-05-06 NOTE — Telephone Encounter (Signed)
lmomtcb x1 

## 2014-05-06 NOTE — Telephone Encounter (Signed)
Patient returning call.  [phone]

## 2014-05-06 NOTE — Telephone Encounter (Signed)
Pt states that she was given samples of Symbicort 160 at last OV with MW 03/24/14  Pt states that she would like to have a Rx called in.  I do not see where it is documented that the patient is switching from [employer] to Symbicort. Per last OV with MW (03/24/14) it states for the patient to look at her formulary for alternatives.  The patient has used Symbicort 160 in the past.   Please advise Dr Melvyn Novas if okay to substitute Symbicort 160 for the [facility] 100? thanks

## 2014-05-06 NOTE — Telephone Encounter (Signed)
Per the 6.30.15 ov w/ MW: Patient Instructions     Call us with formulatary alternatives to dulera 100 = symbicort 80  Alternatives to ventolin = proair, xopenox  Pepcid ac 20 mg at bedtime whenever coughing/ wheezing at night  If you are satisfied with your treatment plan, let your doctor know and he/she can either refill your medications or you can return here when your prescription runs out.  If in any way you are not 100% satisfied, please tell us. If 100% better, tell your friends!  Pulmonary follow up is as needed    Called spoke with patient, she has [facility] Medicare but reports that her pharmacist informed her that Symbicort is on her formulary and the cheaper alternative to the [facility] - she will call for the $25 savings card to try if needed.  Rx sent to [facility] in [place].  Pt aware to call with any questions, concerns or issues.  Nothing further needed; will sign off.

## 2014-05-06 NOTE — Telephone Encounter (Signed)
symbicort 80 - dulera 100 as per her last instructions from me (see ov) - ok to give sample but better option is figure out which one is on her formulary and if neither she's still eligible for the 25 $ a month symbicort card if has Pharmacist, community.

## 2014-05-19 ENCOUNTER — Telehealth (HOSPITAL_COMMUNITY): Payer: Self-pay

## 2014-05-19 NOTE — Telephone Encounter (Signed)
Encounter complete. 

## 2014-05-21 ENCOUNTER — Encounter (HOSPITAL_COMMUNITY): Payer: Medicare Other

## 2014-05-25 ENCOUNTER — Institutional Professional Consult (permissible substitution): Payer: Medicare Other | Admitting: Pulmonary Disease

## 2014-05-28 ENCOUNTER — Ambulatory Visit (HOSPITAL_COMMUNITY): Payer: Medicare Other | Attending: Cardiology | Admitting: Radiology

## 2014-05-28 VITALS — BP 148/61 | Ht 65.0 in | Wt 264.0 lb

## 2014-05-28 DIAGNOSIS — J449 Chronic obstructive pulmonary disease, unspecified: Secondary | ICD-10-CM | POA: Diagnosis not present

## 2014-05-28 DIAGNOSIS — R0602 Shortness of breath: Secondary | ICD-10-CM | POA: Diagnosis not present

## 2014-05-28 DIAGNOSIS — R0609 Other forms of dyspnea: Secondary | ICD-10-CM | POA: Diagnosis not present

## 2014-05-28 DIAGNOSIS — R5381 Other malaise: Secondary | ICD-10-CM | POA: Diagnosis not present

## 2014-05-28 DIAGNOSIS — I10 Essential (primary) hypertension: Secondary | ICD-10-CM | POA: Diagnosis not present

## 2014-05-28 DIAGNOSIS — R079 Chest pain, unspecified: Secondary | ICD-10-CM | POA: Diagnosis not present

## 2014-05-28 DIAGNOSIS — R0989 Other specified symptoms and signs involving the circulatory and respiratory systems: Secondary | ICD-10-CM | POA: Insufficient documentation

## 2014-05-28 DIAGNOSIS — R5383 Other fatigue: Secondary | ICD-10-CM

## 2014-05-28 DIAGNOSIS — I4891 Unspecified atrial fibrillation: Secondary | ICD-10-CM | POA: Diagnosis not present

## 2014-05-28 DIAGNOSIS — J4489 Other specified chronic obstructive pulmonary disease: Secondary | ICD-10-CM | POA: Insufficient documentation

## 2014-05-28 MED ORDER — REGADENOSON 0.4 MG/5ML IV SOLN
0.4000 mg | Freq: Once | INTRAVENOUS | Status: AC
  Administered 2014-05-28: 0.4 mg via INTRAVENOUS

## 2014-05-28 MED ORDER — TECHNETIUM TC 99M SESTAMIBI GENERIC - CARDIOLITE
33.0000 | Freq: Once | INTRAVENOUS | Status: AC | PRN
  Administered 2014-05-28: 33 via INTRAVENOUS

## 2014-05-28 NOTE — Progress Notes (Signed)
[place] [facility] NUCLEAR MED [address] [place], [place] [address] [phone]    Cardiology Nuclear Med Study  [name] is a 63 y.o. female     MRN : [id number]     DOB: 01-03-1951  Procedure Date: 05/28/2014  Nuclear Med Background Indication for Stress Test:  Evaluation for Ischemia Atrial Fibrillation> started Flecainide on 04-09-2014, but couldn't do Treadmill on 04-21-2014 History: Asthma;COPD;Afib;Ablation Cardiac Risk Factors: Hypertension  Symptoms:  Fatigue and SOB   Nuclear Pre-Procedure Caffeine/Decaff Intake:  None> 12 hrs NPO After: 8:30am   Lungs:  clear O2 Sat: 95% on room air. IV 0.9% NS with Angio Cath:  22g  IV Site: R Antecubital x 1, tolerated well IV Started by:  Irven Baltimore, RN  Chest Size (in):  48 Cup Size: DD  Height: 5\' 5"  (1.651 m)  Weight:  264 lb (119.75 kg)  BMI:  Body mass index is 43.93 kg/(m^2). Tech Comments:  Patient held Zebeta this am. Patient used Symbicort this am. Irven Baltimore, RN.    Nuclear Med Study 1 or 2 day study: 2 day  Stress Test Type:  Carlton Adam  Reading MD: N/A  Order Authorizing Provider:  Thompson Grayer, MD  Resting Radionuclide: Technetium 56m Sestamibi  Resting Radionuclide Dose: 33.0 mCi on 06/04/14   Stress Radionuclide:  Technetium 39m Sestamibi  Stress Radionuclide Dose: 33.0 mCi on 05/28/14           Stress Protocol Rest HR: 61 Stress HR: 82  Rest BP: 148/61 Stress BP: 163/53  Exercise Time (min): n/a METS: n/a   Predicted Max HR: 155 bpm % Max HR: 52.9 bpm Rate Pressure Product: 13366   Dose of Adenosine (mg):  n/a Dose of Lexiscan: 0.4 mg  Dose of Atropine (mg): n/a Dose of Dobutamine: n/a mcg/kg/min (at max HR)  Stress Test Technologist: Ileene Hutchinson, EMT-P  Nuclear Technologist:  Earl Many, CNMT     Rest Procedure:  Myocardial perfusion imaging was performed at rest 45 minutes following the intravenous administration of Technetium 80m Sestamibi. Rest ECG: NSR - Normal  EKG  Stress Procedure:  The patient received IV Lexiscan 0.4 mg over 15-seconds.  Technetium 38m Sestamibi injected at 30-seconds.  Quantitative spect images were obtained after a 45 minute delay. Stress ECG: No significant ST segment change suggestive of ischemia.  QPS Raw Data Images:  Acquisition technically good; normal left ventricular size. Stress Images:  There is decreased uptake in the anterior wall. Rest Images:  There is decreased uptake in the anterior wall. Subtraction (SDS):  There is a fixed anteriour defect that is most consistent with breast attenuation. Transient Ischemic Dilatation (Normal <1.22):  0.97 Lung/Heart Ratio (Normal <0.45):  0.32  Quantitative Gated Spect Images QGS EDV:  124 ml QGS ESV:  41 ml  Impression Exercise Capacity:  Lexiscan with no exercise. BP Response:  Normal blood pressure response. Clinical Symptoms:  There is chest pain and dyspnea. ECG Impression:  No significant ST segment change suggestive of ischemia. Comparison with Prior Nuclear Study: No previous nuclear study performed  Overall Impression:  Low risk stress nuclear study with a small, moderate intensity, fixed anterior defect consistent with soft tissue attenuation; no ischemia.  LV Ejection Fraction: 67%.  LV Wall Motion:  NL LV Function; NL Wall Motion  Kirk Ruths

## 2014-06-02 ENCOUNTER — Encounter (HOSPITAL_COMMUNITY): Payer: Medicare Other

## 2014-06-04 ENCOUNTER — Ambulatory Visit (HOSPITAL_COMMUNITY): Payer: Medicare Other | Attending: Cardiovascular Disease

## 2014-06-04 DIAGNOSIS — R0989 Other specified symptoms and signs involving the circulatory and respiratory systems: Secondary | ICD-10-CM

## 2014-06-04 MED ORDER — TECHNETIUM TC 99M SESTAMIBI GENERIC - CARDIOLITE
30.0000 | Freq: Once | INTRAVENOUS | Status: AC | PRN
  Administered 2014-06-04: 30 via INTRAVENOUS

## 2014-06-10 ENCOUNTER — Ambulatory Visit: Payer: Medicare Other | Admitting: Internal Medicine

## 2014-06-15 ENCOUNTER — Encounter: Payer: Self-pay | Admitting: Internal Medicine

## 2014-06-15 ENCOUNTER — Ambulatory Visit (INDEPENDENT_AMBULATORY_CARE_PROVIDER_SITE_OTHER): Payer: Medicare Other | Admitting: Internal Medicine

## 2014-06-15 VITALS — BP 132/86 | HR 63 | Ht 65.0 in | Wt 242.8 lb

## 2014-06-15 DIAGNOSIS — G4733 Obstructive sleep apnea (adult) (pediatric): Secondary | ICD-10-CM

## 2014-06-15 DIAGNOSIS — I10 Essential (primary) hypertension: Secondary | ICD-10-CM

## 2014-06-15 DIAGNOSIS — I4891 Unspecified atrial fibrillation: Secondary | ICD-10-CM

## 2014-06-15 DIAGNOSIS — R0602 Shortness of breath: Secondary | ICD-10-CM

## 2014-06-15 NOTE — Patient Instructions (Signed)
Your physician recommends that you schedule a follow-up appointment in: 3 months with Shelley Palau, NP  Calorie Counting for Weight Loss Calories are energy you get from the things you eat and drink. Your body uses this energy to keep you going throughout the day. The number of calories you eat affects your weight. When you eat more calories than your body needs, your body stores the extra calories as fat. When you eat fewer calories than your body needs, your body burns fat to get the energy it needs. Calorie counting means keeping track of how many calories you eat and drink each day. If you make sure to eat fewer calories than your body needs, you should lose weight. In order for calorie counting to work, you will need to eat the number of calories that are right for you in a day to lose a healthy amount of weight per week. A healthy amount of weight to lose per week is usually 1-2 lb (0.5-0.9 kg). A dietitian can determine how many calories you need in a day and give you suggestions on how to reach your calorie goal.   WHAT DO I NEED TO KNOW ABOUT CALORIE COUNTING? In order to meet your daily calorie goal, you will need to:  Find out how many calories are in each food you would like to eat. Try to do this before you eat.  Decide how much of the food you can eat.  Write down what you ate and how many calories it had. Doing this is called keeping a food log. WHERE DO I FIND CALORIE INFORMATION? The number of calories in a food can be found on a Nutrition Facts label. Note that all the information on a label is based on a specific serving of the food. If a food does not have a Nutrition Facts label, try to look up the calories online or ask your dietitian for help. HOW DO I DECIDE HOW MUCH TO EAT? To decide how much of the food you can eat, you will need to consider both the number of calories in one serving and the size of one serving. This information can be found on the Nutrition Facts label.  If a food does not have a Nutrition Facts label, look up the information online or ask your dietitian for help. Remember that calories are listed per serving. If you choose to have more than one serving of a food, you will have to multiply the calories per serving by the amount of servings you plan to eat. For example, the label on a package of bread might say that a serving size is 1 slice and that there are 90 calories in a serving. If you eat 1 slice, you will have eaten 90 calories. If you eat 2 slices, you will have eaten 180 calories. HOW DO I KEEP A FOOD LOG? After each meal, record the following information in your food log:  What you ate.  How much of it you ate.  How many calories it had.  Then, add up your calories. Keep your food log near you, such as in a small notebook in your pocket. Another option is to use a mobile app or website. Some programs will calculate calories for you and show you how many calories you have left each time you add an item to the log. WHAT ARE SOME CALORIE COUNTING TIPS?  Use your calories on foods and drinks that will fill you up and not leave you hungry. Some  examples of this include foods like nuts and nut butters, vegetables, lean proteins, and high-fiber foods (more than 5 g fiber per serving).  Eat nutritious foods and avoid empty calories. Empty calories are calories you get from foods or beverages that do not have many nutrients, such as candy and soda. It is better to have a nutritious high-calorie food (such as an avocado) than a food with few nutrients (such as a bag of chips).  Know how many calories are in the foods you eat most often. This way, you do not have to look up how many calories they have each time you eat them.  Look out for foods that may seem like low-calorie foods but are really high-calorie foods, such as baked goods, soda, and fat-free candy.  Pay attention to calories in drinks. Drinks such as sodas, specialty coffee  drinks, alcohol, and juices have a lot of calories yet do not fill you up. Choose low-calorie drinks like water and diet drinks.  Focus your calorie counting efforts on higher calorie items. Logging the calories in a garden salad that contains only vegetables is less important than calculating the calories in a milk shake.  Find a way of tracking calories that works for you. Get creative. Most people who are successful find ways to keep track of how much they eat in a day, even if they do not count every calorie. WHAT ARE SOME PORTION CONTROL TIPS?  Know how many calories are in a serving. This will help you know how many servings of a certain food you can have.  Use a measuring cup to measure serving sizes. This is helpful when you start out. With time, you will be able to estimate serving sizes for some foods.  Take some time to put servings of different foods on your favorite plates, bowls, and cups so you know what a serving looks like.  Try not to eat straight from a bag or box. Doing this can lead to overeating. Put the amount you would like to eat in a cup or on a plate to make sure you are eating the right portion.  Use smaller plates, glasses, and bowls to prevent overeating. This is a quick and easy way to practice portion control. If your plate is smaller, less food can fit on it.  Try not to multitask while eating, such as watching TV or using your computer. If it is time to eat, sit down at a table and enjoy your food. Doing this will help you to start recognizing when you are full. It will also make you more aware of what and how much you are eating. HOW CAN I CALORIE COUNT WHEN EATING OUT?  Ask for smaller portion sizes or child-sized portions.  Consider sharing an entree and sides instead of getting your own entree.  If you get your own entree, eat only half. Ask for a box at the beginning of your meal and put the rest of your entree in it so you are not tempted to eat  it.  Look for the calories on the menu. If calories are listed, choose the lower calorie options.  Choose dishes that include vegetables, fruits, whole grains, low-fat dairy products, and lean protein. Focusing on smart food choices from each of the 5 food groups can help you stay on track at restaurants.  Choose items that are boiled, broiled, grilled, or steamed.  Choose water, milk, unsweetened iced tea, or other drinks without added sugars. If you  want an alcoholic beverage, choose a lower calorie option. For example, a regular margarita can have up to 700 calories and a glass of wine has around 150.  Stay away from items that are buttered, battered, fried, or served with cream sauce. Items labeled "crispy" are usually fried, unless stated otherwise.  Ask for dressings, sauces, and syrups on the side. These are usually very high in calories, so do not eat much of them.  Watch out for salads. Many people think salads are a healthy option, but this is often not the case. Many salads come with bacon, fried chicken, lots of cheese, fried chips, and dressing. All of these items have a lot of calories. If you want a salad, choose a garden salad and ask for grilled meats or steak. Ask for the dressing on the side, or ask for olive oil and vinegar or lemon to use as dressing.  Estimate how many servings of a food you are given. For example, a serving of cooked rice is  cup or about the size of half a tennis ball or one cupcake wrapper. Knowing serving sizes will help you be aware of how much food you are eating at restaurants. The list below tells you how big or small some common portion sizes are based on everyday objects.  1 oz--4 stacked dice.  3 oz--1 deck of cards.  1 tsp--1 dice.  1 Tbsp-- a Ping-Pong ball.  2 Tbsp--1 Ping-Pong ball.   cup--1 tennis ball or 1 cupcake wrapper.  1 cup--1 baseball. Document Released: 09/11/2005 Document Revised: 01/26/2014 Document Reviewed:  07/17/2013 [facility] Patient Information 2015 [place], [place]. This information is not intended to replace advice given to you by your health care provider. Make sure you discuss any questions you have with your health care provider.   Exercise to Lose Weight Exercise and a healthy diet may help you lose weight. Your doctor may suggest specific exercises. EXERCISE IDEAS AND TIPS  Choose low-cost things you enjoy doing, such as walking, bicycling, or exercising to workout videos.  Take stairs instead of the elevator.  Walk during your lunch break.  Park your car further away from work or school.  Go to a gym or an exercise class.  Start with 5 to 10 minutes of exercise each day. Build up to 30 minutes of exercise 4 to 6 days a week.  Wear shoes with good support and comfortable clothes.  Stretch before and after working out.  Work out until you breathe harder and your heart beats faster.  Drink extra water when you exercise.  Do not do so much that you hurt yourself, feel dizzy, or get very short of breath. Exercises that burn about 150 calories:  Running 1  miles in 15 minutes.  Playing volleyball for 45 to 60 minutes.  Washing and waxing a car for 45 to 60 minutes.  Playing touch football for 45 minutes.  Walking 1  miles in 35 minutes.  Pushing a stroller 1  miles in 30 minutes.  Playing basketball for 30 minutes.  Raking leaves for 30 minutes.  Bicycling 5 miles in 30 minutes.  Walking 2 miles in 30 minutes.  Dancing for 30 minutes.  Shoveling snow for 15 minutes.  Swimming laps for 20 minutes.  Walking up stairs for 15 minutes.  Bicycling 4 miles in 15 minutes.  Gardening for 30 to 45 minutes.  Jumping rope for 15 minutes.  Washing windows or floors for 45 to 60 minutes. Document Released: 10/14/2010  Document Revised: 12/04/2011 Document Reviewed: 10/14/2010 [facility] Patient Information 2015 [place], [place]. This information is not intended to  replace advice given to you by your health care provider. Make sure you discuss any questions you have with your health care provider.

## 2014-06-15 NOTE — Progress Notes (Signed)
Primary Care Physician: [name]., MD Referring Physician:  Dr [place]   [name] is a 63 y.o. female with a h/o atrial flutter s/p prior ablation who now presents with symptomatic persistent atrial fibrillation.  She reports initially being diagnosed with atrial flutter 2/13 after presenting to [facility] with tachypalpitations and SOB.   She underwent atrial flutter ablation by me in 2013.  She has done very well, without any difficulty until 5/15 when she presented with afib with RVR.  She required cardioversion by Dr Meda Coffee.  She was referred for further management and was started  on Flecainide 50 mg bid and has been maintaining SR. Has a migraine this am but has h/o of migraines Adequately  anticoagulated with xarelto. Has been using weight watchers and has lost some weight. Using CPAP. Had a stress test, with flecainide initiated, which was low risk    The patient is tolerating medications without difficulties and is otherwise without complaint today.   Past Medical History  Diagnosis Date  . Asthma     Spirometry 1989: FEV1 2.12 (71%) with ratio 82.  HFA 90% after coaching 11-09-10  . COPD (chronic obstructive pulmonary disease)   . GERD (gastroesophageal reflux disease)     EGD with HH / esophagitis 06-30-93  . Osteoarthritis   . DDD (degenerative disc disease)   . Pelvic fracture   . Hiatal hernia   . Migraine   . Anxiety disorder   . Anemia   . Diverticulosis   . Adenomatous colon polyp 1993  . Atrial flutter     Status post cardioversion 2-12 2013. s/p RFCA 5/13 with JAllred (coumadin and amio stopped)   . Essential hypertension, benign   . Pneumonia   . Sleep apnea   . Fibromyalgia   . Lipoma of colon   . Internal and external hemorrhoids without complication    Past Surgical History  Procedure Laterality Date  . Total knee arthroplasty      x2 09/2006  . Rotator cuff repair      right  . A flutter ablation  02/22/2012  . Dilation and  curettage of uterus    . Colonoscopy  05/2010, 07/2000  . Esophagogastroduodenoscopy  05/2010, 06/1993  . Cardioversion N/A 02/19/2014    Procedure: CARDIOVERSION;  Surgeon: Dorothy Spark, MD;  Location: [facility] OR;  Service: Cardiovascular;  Laterality: N/A;    Current Outpatient Prescriptions  Medication Sig Dispense Refill  . acetaminophen (TYLENOL) 500 MG tablet Take 1,000-1,500 mg by mouth 2 (two) times daily as needed for headache (migraines).      Marland Kitchen albuterol (PROAIR HFA) 108 (90 BASE) MCG/ACT inhaler Inhale 2 puffs into the lungs every 6 (six) hours as needed for wheezing or shortness of breath.  1 Inhaler  3  . bisoprolol (ZEBETA) 10 MG tablet Take 1 tablet (10 mg total) by mouth daily.  30 tablet  2  . budesonide-formoterol (SYMBICORT) 80-4.5 MCG/ACT inhaler Inhale 2 puffs into the lungs 2 (two) times daily.  10.2 g  5  . celecoxib (CELEBREX) 200 MG capsule Take 1 capsule every 3 days      . diltiazem (CARDIZEM CD) 180 MG 24 hr capsule Take 180 mg by mouth daily.      . flecainide (TAMBOCOR) 50 MG tablet Take 1 tablet (50 mg total) by mouth 2 (two) times daily.  180 tablet  3  . furosemide (LASIX) 20 MG tablet Take 20 mg by mouth daily as needed for edema.       Marland Kitchen  losartan (COZAAR) 100 MG tablet Take 1 tablet (100 mg total) by mouth daily.  30 tablet  2  . omeprazole (PRILOSEC) 40 MG capsule Take 40 mg by mouth daily before breakfast.      . rivaroxaban (XARELTO) 20 MG TABS tablet Take 1 tablet (20 mg total) by mouth daily with supper.  30 tablet  11  . levalbuterol (XOPENEX HFA) 45 MCG/ACT inhaler Inhale 2 puffs into the lungs every 4 (four) hours as needed for wheezing.  1 Inhaler  12   No current facility-administered medications for this visit.    Allergies  Allergen Reactions  . Epinephrine Other (See Comments)    Pallpitations during dental procedures  . Propoxyphene Hcl Nausea And Vomiting  . Shellfish Allergy Hives and Shortness Of Breath  . Theophyllines Nausea And  Vomiting and Other (See Comments)    Heart races and pounds  . Ace Inhibitors Cough    History   Social History  . Marital Status: Widowed    Spouse Name: N/A    Number of Children: 0  . Years of Education: N/A   Occupational History  . Accounting tech at [employer] History Main Topics  . Smoking status: Former Smoker -- 1.00 packs/day for 20 years    Types: Cigarettes    Quit date: 09/25/1984  . Smokeless tobacco: Never Used  . Alcohol Use: No  . Drug Use: No  . Sexual Activity: Not Currently   Other Topics Concern  . Not on file   Social History Narrative  . No narrative on file    Family History  Problem Relation Age of Onset  . Emphysema Father     smoker  . Heart disease Father   . Breast cancer Mother   . Breast cancer Sister   . Heart disease Sister   . Colon cancer Neg Hx     ROS- All systems are reviewed and negative except as per the HPI above  Physical Exam: Filed Vitals:   06/15/14 1049  BP: 132/86  Pulse: 63  Height: 5\' 5"  (1.651 m)  Weight: 110.133 kg (242 lb 12.8 oz)    GEN- The patient is overweight appearing, alert and oriented x 3 today.   Head- normocephalic, atraumatic Eyes-  Sclera clear, conjunctiva pink Ears- hearing intact Oropharynx- clear Neck- supple, no JVP Lymph- no cervical lymphadenopathy Lungs- Clear with a prolonged expiratory phase Heart- Regular rate and rhythm, no murmurs, rubs or gallops, PMI not laterally displaced GI- soft, NT, ND, + BS Extremities- no clubbing, cyanosis, or edema MS- diffuse muscle atrophy Skin- diffuse small ecchymosis Psych- euthymic mood, full affect Neuro- strength and sensation are intact  EKG today reveals sinus rhythm at 63 bpm.QTC at 450 ms.   Assessment and Plan:  1. Atrial fibrillation The patient  with recent afib for which she was cardioverted.  She is appropriately anticoagulated with xarelto (chads2vasc score is at least 3). and  flecainide  50mg  BID is maintaining NSR. This can be increased to 100mg  BID if her afib returns.  .  2. OSA Continue with use of CPAP.  3. SOB multifactoral Likely due to lung disease--> she follows with Dr Melvyn Novas Recent Brantley Fling was low risk  4. Obesity The importance of weight reduction was discussed today Body mass index is 40.4 kg/(m^2). Congratulated on her weight loss, encouraged to continue efforts.  Return in 3 months in afib clinic.

## 2014-06-24 ENCOUNTER — Ambulatory Visit: Payer: Medicare Other | Admitting: Internal Medicine

## 2014-07-03 ENCOUNTER — Encounter: Payer: Self-pay | Admitting: Pulmonary Disease

## 2014-07-03 ENCOUNTER — Telehealth: Payer: Self-pay | Admitting: Internal Medicine

## 2014-07-03 ENCOUNTER — Ambulatory Visit (INDEPENDENT_AMBULATORY_CARE_PROVIDER_SITE_OTHER): Payer: Medicare Other | Admitting: Pulmonary Disease

## 2014-07-03 VITALS — BP 122/60 | HR 69 | Temp 97.8°F | Ht 65.0 in | Wt 265.2 lb

## 2014-07-03 DIAGNOSIS — G4733 Obstructive sleep apnea (adult) (pediatric): Secondary | ICD-10-CM

## 2014-07-03 NOTE — Progress Notes (Signed)
Subjective:    Patient ID: [name], female    DOB: [birth year], 63 y.o.   MRN: [id number]  HPI The patient is a 63 year old female who I've been asked to see for management of obstructive sleep apnea. She was diagnosed in 2013 with mild OSA, with an AHI of 15 events per hour. He subsequently underwent a CPAP titration study with an optimal pressure of 8 cm of water. She has been on CPAP since that time, and feels that she has done fairly well. She currently uses nasal pillows without significant mouth opening, and also has a heated humidifier. The patient states that she has not had a download in years off of her device, and currently does not feel rested in the mornings upon arising. She has not been told that she has persistent snoring or breakthrough apneas. She does have a chronic pain syndrome which results in disrupted sleep. She denies any significant inappropriate daytime sleepiness, but does get sleepy in the evenings watching television or movies. She denies any sleepiness with driving. The patient states that her weight is up 30+ pounds since her sleep study, and her Epworth score today is 4   Sleep Questionnaire What time do you typically go to bed?( Between what hours) 11p-2AM 11p-2AM at 1433 on 07/03/14 by Inge Rise, CMA How long does it take you to fall asleep? 30 min-1 hr 30 min-1 hr at 1433 on 07/03/14 by Inge Rise, CMA How many times during the night do you wake up? 2 2 at 1433 on 07/03/14 by Inge Rise, CMA What time do you get out of bed to start your day? 0630 0630 at 1433 on 07/03/14 by Inge Rise, CMA Do you drive or operate heavy machinery in your occupation? No No at 1433 on 07/03/14 by Inge Rise, CMA How much has your weight changed (up or down) over the past two years? (In pounds) 30 lb (13.608 kg) 30 lb (13.608 kg) at 1433 on 07/03/14 by Inge Rise, CMA Have you ever had a sleep study before? Yes Yes at 1433 on 07/03/14 by  [place] If yes, [facility] at 1433 on 07/03/14 by Inge Rise, CMA If yes, date of study? 2013 2013 at 1433 on 07/03/14 by Inge Rise, CMA Do you currently use CPAP? Yes Yes at 1433 on 07/03/14 by Inge Rise, CMA If so, what pressure? ? 4 ? 4 at 1433 on 07/03/14 by Inge Rise, CMA Do you wear oxygen at any time? No No at 1433 on 07/03/14 by Inge Rise, CMA   Review of Systems  Constitutional: Negative for fever and unexpected weight change.  HENT: Negative for congestion, dental problem, ear pain, nosebleeds, postnasal drip, rhinorrhea, sinus pressure, sneezing, sore throat and trouble swallowing.   Eyes: Negative for redness and itching.  Respiratory: Positive for cough and shortness of breath. Negative for chest tightness and wheezing.   Cardiovascular: Positive for palpitations. Negative for leg swelling.  Gastrointestinal: Negative for nausea and vomiting.  Genitourinary: Negative for dysuria.  Musculoskeletal: Positive for arthralgias. Negative for joint swelling.  Skin: Negative for rash.  Neurological: Positive for headaches.  Hematological: Does not bruise/bleed easily.  Psychiatric/Behavioral: Negative for dysphoric mood. The patient is nervous/anxious.        Objective:   Physical Exam Constitutional:  Obese female, no acute distress  HENT:  Nares patent without discharge, mild deviation to the  left with narrowing.   Oropharynx without exudate, palate and uvula are thick and elongated.   Eyes:  Perrla, eomi, no scleral icterus  Neck:  No JVD, no TMG  Cardiovascular:  Normal rate, regular rhythm, no rubs or gallops.  3/6 sem        Intact distal pulses  Pulmonary :  Normal breath sounds, no stridor or respiratory distress   No rales, rhonchi, or wheezing  Abdominal:  Soft, nondistended, bowel sounds present.  No tenderness noted.   Musculoskeletal:  No lower extremity edema  noted.  Lymph Nodes:  No cervical lymphadenopathy noted  Skin:  No cyanosis noted  Neurologic:  Alert, appropriate, moves all 4 extremities without obvious deficit.        Assessment & Plan:

## 2014-07-03 NOTE — Assessment & Plan Note (Signed)
The patient has a history of mild obstructive sleep apnea, but has gained significant weight since last visit. I suspect her pressure needs his higher than her current setting, and would like to get a download off her device in approximately 3 weeks.  I have reminded her to keep up with her mask changes and supplies, and to work aggressively on weight loss. I suspect a lot of her disrupted sleep test to do with her chronic pain syndrome. If she is doing well, will see her back in one year, but will make adjustments to her pressure based on the download.

## 2014-07-03 NOTE — Patient Instructions (Signed)
Continue on cpap, and keep up with mask changes and supplies. We will send an order to your home care company to download your machine in 3 weeks, and we will call you with the results.  If you do not hear from [place] in one week after donwload, please call us. Keep working on weight loss.  followup with me again in one year if you are doing well.

## 2014-07-03 NOTE — Telephone Encounter (Signed)
Received request from Nurse fax box, documents faxed for surgical clearance. To: [employer] Fax number: [phone] Attention: 10.9.15/km

## 2014-09-03 ENCOUNTER — Encounter (HOSPITAL_COMMUNITY): Payer: Self-pay | Admitting: Internal Medicine

## 2014-09-15 ENCOUNTER — Ambulatory Visit (HOSPITAL_COMMUNITY): Payer: Medicare Other | Admitting: Nurse Practitioner

## 2014-09-29 ENCOUNTER — Ambulatory Visit (HOSPITAL_COMMUNITY): Payer: Medicare Other | Admitting: Nurse Practitioner

## 2014-10-02 ENCOUNTER — Encounter (INDEPENDENT_AMBULATORY_CARE_PROVIDER_SITE_OTHER): Payer: Self-pay

## 2014-10-02 ENCOUNTER — Encounter: Payer: Self-pay | Admitting: Adult Health

## 2014-10-02 ENCOUNTER — Ambulatory Visit (INDEPENDENT_AMBULATORY_CARE_PROVIDER_SITE_OTHER): Payer: Medicare Other | Admitting: Adult Health

## 2014-10-02 ENCOUNTER — Ambulatory Visit (INDEPENDENT_AMBULATORY_CARE_PROVIDER_SITE_OTHER)
Admission: RE | Admit: 2014-10-02 | Discharge: 2014-10-02 | Disposition: A | Discharge status: Home / Self Care | Payer: Medicare Other | Source: Ambulatory Visit | Attending: Adult Health | Admitting: Adult Health

## 2014-10-02 VITALS — BP 128/78 | HR 86 | Temp 98.2°F | Ht 65.5 in | Wt 248.4 lb

## 2014-10-02 DIAGNOSIS — J45909 Unspecified asthma, uncomplicated: Secondary | ICD-10-CM

## 2014-10-02 MED ORDER — LEVALBUTEROL HCL 0.63 MG/3ML IN NEBU
0.6300 mg | INHALATION_SOLUTION | Freq: Once | RESPIRATORY_TRACT | Status: AC
  Administered 2014-10-02: 0.63 mg via RESPIRATORY_TRACT

## 2014-10-02 MED ORDER — HYDROCODONE-HOMATROPINE 5-1.5 MG/5ML PO SYRP: 5.0000 mL | ORAL_SOLUTION | Freq: Four times a day (QID) | ORAL | Status: DC | PRN

## 2014-10-02 MED ORDER — AMOXICILLIN-POT CLAVULANATE 875-125 MG PO TABS: 1.0000 | ORAL_TABLET | Freq: Two times a day (BID) | ORAL | Status: AC

## 2014-10-02 MED ORDER — ALBUTEROL SULFATE HFA 108 (90 BASE) MCG/ACT IN AERS: 2.0000 | INHALATION_SPRAY | RESPIRATORY_TRACT | Status: DC | PRN

## 2014-10-02 MED ORDER — PREDNISONE 10 MG PO TABS: ORAL_TABLET | ORAL | Status: DC

## 2014-10-02 NOTE — Addendum Note (Signed)
Addended by: Parke Poisson E on: 10/02/2014 01:57 PM   Modules accepted: Orders, Medications

## 2014-10-02 NOTE — Assessment & Plan Note (Addendum)
Slow to resolve flare with sinusitis /bronchitis  CXR with no acute process.  xopenex neb x 1  Plan  Augmentin 875mg  Twice daily  For 10 days -take with food.  Mucinex DM Twice daily  As needed  Cough/congestion  Saline nasal rinses As needed   Prednisone taper over next week.  Restart Symbicort 2 puffs Twice daily  , rinse after use.  Hydromet 1 tsp every 6hr as needed. , may make you sleepy.  Follow up Dr. Melvyn Novas  In 6 weeks and As needed

## 2014-10-02 NOTE — Patient Instructions (Addendum)
Augmentin 875mg  Twice daily  For 10 days -take with food.  Mucinex DM Twice daily  As needed  Cough/congestion  Saline nasal rinses As needed   Prednisone taper over next week.  Restart Symbicort 2 puffs Twice daily  , rinse after use.  Hydromet 1 tsp every 6hr as needed. , may make you sleepy.  Follow up Dr. Melvyn Novas  In 6 weeks and As needed

## 2014-10-02 NOTE — Progress Notes (Signed)
Subjective:    Patient ID: [name], female    DOB: [birth year]    MRN: [id number]    Brief patient profile:  56 yowf quit smoking in 1986 with history of Asthma and GERD.     History of Present Illness  July 19, 2009--Presents for an acute office visit.x 2d. tripped in bathroom, fell against toilet seat, hitting right ribs on toilet seat. Pain w/ inspiration and very tender to touch along right lateral ribs. No skinbreak. No bruising noted. Has had recent bronchitis tx w/ doxcycline/pred last week, better w/ decreased cough adn congestion. No discolored mucus or fever. .Cont on celebrex  Warm heat to ribs.  Vicodin 1-2 every 4-6 hrs as needed pain.   November 09, 2010 acute ov Cough and wheezing x 2 months despite symbicort 80 on multiple oil based vitamins and ppi daily but not ac assoc with nasal congestion but no purulent secretions at this time.  1) Symbicort 160 2 puffs first thing in am and 2 puffs again in pm about 12 hours later  2) Ventolin is only to be used if needed  3) Prednsione 10 mg 4 each am x 2days, 2x2days, 1x2days and stop  4) Omeprazole 40 mg Take one 30-60 min before first meal of the day also take Pepcid 20 mg one at bedtime as long as flare in resp symptoms  5) use flutter whenever coughing  6) Mucinex dm 2 every 12 hours if cough  7) GERD (REFLUX) diet  12/28/2010 ov cc cough and breathing much better on rx.  Pt denies any significant sore throat, dysphagia, itching, sneezing,  nasal congestion or excess/ purulent secretions,  fever, chills, sweats, unintended wt loss, pleuritic or exertional cp, hempoptysis, orthopnea pnd or leg swelling.    Also denies any obvious fluctuation of symptoms with weather or environmental changes or other aggravating or alleviating factors.  No noct co's or need for rescue   12/28/2010 ov much better on symbicort, still pnds rec Ok to try symbicort 160 1-2 every 12 hours For drainage try chlortrimeton 4mg  one every 6  hours  Seen by Dr Halford Chessman 09/30/12 for AB exac > rx pred   10/09/2012 f/u ov/Wert cc doing ok until xmas with exposure to sick people while on acei and coreg with persistent  flare of ab since and sob some better p prednisone but still severe cough with min thick yellow mucus. Some better p albuterol. >changed coreg to bystolic , symbicort to dulera  And change ACEi to ARB   10/30/2012 Follow up  Patient returns for a two-week followup and medication review. Patient was seen last visit with persistent cough and wheezing. Patient was changed off of her ACE inhibitor her beta blocker Coreg was changed over to [employer]. The patient was changed from Symbicort to delay or a. Patient returns today reporting that she is much improved. Cough and wheezing are decreased essentially. She feels that she can take in a deep breath and has poor airflow. She does complain that she has sinus congestion, drainage, and sinus pain, and pressure with a sinus headache. Patient denies any fever or hemoptysis, orthopnea, PND, or leg swelling. Patient has brought her drug formulary in today for several her medications are on tier 3 or 4  medications. She requests meds be changed to tier 1 or 2 to have more for affordability  rec Begin Losartan 100mg  daily (In place of Benicar)  Avoid ACE Inhibitors in future due to cough .  Begin  Bisoprolol 10mg  daily ( in place Bystolic )  Continue on Dulera 2 puffs Twice daily  -brush/rinse /gargle after inhaler use.  Omnicef 300mg  Twice daily  For 10 days  Mucinex Twice daily  As needed  Congestion.  Fluids and rest.  Saline nasal rinses As needed     03/24/2014 f/u ov/Wert re: asthma/ gerd Chief Complaint  Patient presents with  . Follow-up    Pt states that she is here for refill on Dulera. She states that her heart has went out of rythym x 2 since last visit vist.  She uses rescue inhaler approx 2 x per wk.   out of dulera x one week/needing more rescue since then for cough and  subj wheeze with doe x more than slow adls  >no changes   0/86/7619 Acute OV  Complains of increased cough, congestion, minimally productive. No fever,  Has trouble with itching and dry skin. Is worried her new heart meds are causing her to itch. Has been using tanning bed at home daily .  We discussed sun /tanning bed exposure.     10/02/2014 Acute OV  Complains of cough occasionally producing dark yellow mucus, head congestion w/ clear/bloody drainage, PND, increased SOB, wheezing, tightness x8 days.  Seen by PCP 1 week ago , tx w/ Levaquin and pred taper for sinusitis and bronchitis .  Feels no better.  Denies f/c/s, n/v/d, hemoptysis.  Misunderstood and stopped symbicort.  Sinus congestion is clear but still coughing up thick mucus-yellow. Has sinus pressure and teeth pain.  CXR today shows no acute process Taking mucinex without much help.   Current Medications, Allergies, Complete Past Medical History, Past Surgical History, Family History, and Social History were reviewed in [employer] record.  ROS  The following are not active complaints unless bolded sore throat, dysphagia, dental problems, itching, sneezing,  nasal congestion or excess/ purulent secretions, +PND ,  ear ache,   fever, chills, sweats, unintended wt loss, pleuritic or exertional cp, hemoptysis,  orthopnea pnd or leg swelling, presyncope, palpitations, heartburn, abdominal pain, anorexia, nausea, vomiting, diarrhea  or change in bowel or urinary habits, change in stools or urine, dysuria,hematuria,  rash, arthralgias, visual complaints, headache, numbness weakness or ataxia or problems with walking or coordination,  change in mood/affect or memory.        Allergies 1) ! Hubbard Robinson  2) ! Epinephrine  3) ! 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 , max sinus  tenderness  NECK: Supple w/ fair ROM; no JVD; normal carotid impulses w/o bruits; no thyromegaly or nodules palpated; no lymphadenopathy.  CHEST Barking cough, scattered rhonchi  HEART: RRR, no m/r/g heard  ABDOMEN: Soft & nt; nml bowel sounds; no organomegaly or masses detected.  EXT: Warm bilat, no calf pain, edema, clubbing, pulses intact, arthritic changes in hands.  Skin , no rash, dry skin   CXR 10/02/2014  No acute cardiopulmonary disease. 2. Stable cardiomegaly.  Assessment & Plan:

## 2014-10-06 ENCOUNTER — Telehealth: Payer: Self-pay | Admitting: Pulmonary Disease

## 2014-10-06 DIAGNOSIS — G4733 Obstructive sleep apnea (adult) (pediatric): Secondary | ICD-10-CM

## 2014-10-06 NOTE — Telephone Encounter (Signed)
Called [facility] [phone]. Spoke with Santiago Glad. She is going to have to talk with tech when he comes in later and see. She will call me once she finds out the below

## 2014-10-06 NOTE — Telephone Encounter (Signed)
Mindy, this pt's download does not have any information about AHI or breakthru apnea.  Does this mean her machine can't do this, or they need to add it to the report.  I need to get some idea if her apnea is being controlled on her current pressure of 8cm.

## 2014-10-07 NOTE — Telephone Encounter (Signed)
Let pt know that I have no way of knowing what is going on with her cpap.  Please send an order to dme to loan her an auto device set on 5-20cm for 3 weeks, then send download to me for a review.

## 2014-10-07 NOTE — Telephone Encounter (Signed)
Called pt and is aware. Order placed. Nothing further needed

## 2014-10-07 NOTE — Telephone Encounter (Signed)
Santiago Glad called back from [facility]. She reports pt unit does not put AHI or any breakthru apnea info. She has remstar plus c reflex 2013 model. They also contacted [employer] to see if they can get this info to [place] and was told could not. Please advise [facility] [place] thanks

## 2014-10-07 NOTE — Telephone Encounter (Signed)
Called [facility] and reports they are going to look into this today and will call us once done. Will await call back

## 2014-10-08 ENCOUNTER — Encounter (HOSPITAL_COMMUNITY): Payer: Self-pay | Admitting: Internal Medicine

## 2014-10-09 ENCOUNTER — Other Ambulatory Visit: Payer: Self-pay | Admitting: Internal Medicine

## 2014-11-08 ENCOUNTER — Other Ambulatory Visit: Payer: Self-pay | Admitting: Internal Medicine

## 2014-11-09 ENCOUNTER — Ambulatory Visit: Payer: Self-pay | Admitting: Nurse Practitioner

## 2014-11-13 ENCOUNTER — Ambulatory Visit: Payer: Medicare Other | Admitting: Internal Medicine

## 2014-11-17 ENCOUNTER — Ambulatory Visit: Payer: Self-pay | Admitting: Nurse Practitioner

## 2014-11-18 ENCOUNTER — Ambulatory Visit: Payer: Self-pay | Admitting: Nurse Practitioner

## 2014-11-19 ENCOUNTER — Encounter: Payer: Self-pay | Admitting: Nurse Practitioner

## 2014-11-19 ENCOUNTER — Telehealth: Payer: Self-pay | Admitting: *Deleted

## 2014-11-19 ENCOUNTER — Ambulatory Visit (INDEPENDENT_AMBULATORY_CARE_PROVIDER_SITE_OTHER): Payer: Medicare Other | Admitting: Nurse Practitioner

## 2014-11-19 VITALS — BP 130/58 | HR 69 | Ht 65.0 in | Wt 257.0 lb

## 2014-11-19 DIAGNOSIS — I480 Paroxysmal atrial fibrillation: Secondary | ICD-10-CM

## 2014-11-19 LAB — CBC WITH DIFFERENTIAL/PLATELET
BASOS ABS: 0.1 10*3/uL (ref 0.0–0.1)
Basophils Relative: 0.8 % (ref 0.0–3.0)
Eosinophils Absolute: 0.2 10*3/uL (ref 0.0–0.7)
Eosinophils Relative: 1.9 % (ref 0.0–5.0)
HCT: 29.9 % — ABNORMAL LOW (ref 36.0–46.0)
Hemoglobin: 9.7 g/dL — ABNORMAL LOW (ref 12.0–15.0)
LYMPHS ABS: 1.9 10*3/uL (ref 0.7–4.0)
Lymphocytes Relative: 22 % (ref 12.0–46.0)
MCHC: 32.6 g/dL (ref 30.0–36.0)
MCV: 80.5 fl (ref 78.0–100.0)
MONOS PCT: 6.7 % (ref 3.0–12.0)
Monocytes Absolute: 0.6 10*3/uL (ref 0.1–1.0)
Neutro Abs: 6 10*3/uL (ref 1.4–7.7)
Neutrophils Relative %: 68.6 % (ref 43.0–77.0)
PLATELETS: 432 10*3/uL — AB (ref 150.0–400.0)
RBC: 3.72 Mil/uL — AB (ref 3.87–5.11)
RDW: 18.2 % — AB (ref 11.5–15.5)
WBC: 8.7 10*3/uL (ref 4.0–10.5)

## 2014-11-19 LAB — BASIC METABOLIC PANEL
BUN: 17 mg/dL (ref 6–23)
CALCIUM: 9.5 mg/dL (ref 8.4–10.5)
CHLORIDE: 98 meq/L (ref 96–112)
CO2: 31 mEq/L (ref 19–32)
CREATININE: 1.27 mg/dL — AB (ref 0.40–1.20)
GFR: 44.73 mL/min — ABNORMAL LOW (ref >60.00)
Glucose, Bld: 135 mg/dL — ABNORMAL HIGH (ref 70–99)
Potassium: 3.9 mEq/L (ref 3.5–5.1)
Sodium: 137 mEq/L (ref 135–145)

## 2014-11-19 NOTE — Telephone Encounter (Signed)
-----   Message from Roderic Palau, NP sent at 11/19/2014  1:56 PM EST ----- Please inform of results. Pt is anemic but H/H has been at this value for some time.Reviewed labs reveal hgb of 9-10. Ask pt if she has ever been worked up for anemia. Kidney function looks ok.Crcl at176, No change in Noac needed.

## 2014-11-19 NOTE — Patient Instructions (Signed)
Your physician wants you to follow-up in: 6 months with Dr. Vallery Ridge will receive a reminder letter in the mail two months in advance. If you don't receive a letter, please call our office to schedule the follow-up appointment.  Your physician recommends that you return for lab work  Today for [place] and [place]

## 2014-11-19 NOTE — Progress Notes (Signed)
Primary Care Physician: [name]., MD Referring Physician:  Dr [place]   [name] is a 64 y.o. female with a h/o atrial flutter s/p prior ablation who now presents with symptomatic persistent atrial fibrillation.  She reports initially being diagnosed with atrial flutter 2/13 after presenting to [facility] with tachypalpitations and SOB.   She underwent atrial flutter ablation by Dr. Rayann Heman in 2013.  She has done very well, without any difficulty until 5/15 when she presented with afib with RVR.  She required cardioversion by Dr Meda Coffee.  She was referred for further management and was started  on Flecainide 50 mg bid and has been maintaining SR.  Adequately  anticoagulated with xarelto. Has been  using CPAP. Had a stress test, with flecainide initiated, which was low risk   She developed bronchitis and pneumonia in Dec/Jan requiring two rounds of antibiotics and prednisone. This caused some afib for a few days, but otherwise has been maintaining SR on flecainide. Tolerating blood thinner. Has to walk with a cane due to knee issues but is interested in starting silver sneakers.   The patient is tolerating medications without difficulties and is otherwise without complaint today.   Past Medical History  Diagnosis Date  . Asthma     Spirometry 1989: FEV1 2.12 (71%) with ratio 82.  HFA 90% after coaching 11-09-10  . COPD (chronic obstructive pulmonary disease)   . GERD (gastroesophageal reflux disease)     EGD with HH / esophagitis 06-30-93  . Osteoarthritis   . DDD (degenerative disc disease)   . Pelvic fracture   . Hiatal hernia   . Migraine   . Anxiety disorder   . Anemia   . Diverticulosis   . Adenomatous colon polyp 1993  . Atrial flutter     Status post cardioversion 2-12 2013. s/p RFCA 5/13 with JAllred (coumadin and amio stopped)   . Essential hypertension, benign   . Pneumonia   . Sleep apnea   . Fibromyalgia   . Lipoma of colon   . Internal and  external hemorrhoids without complication    Past Surgical History  Procedure Laterality Date  . Total knee arthroplasty      x2 09/2006  . Rotator cuff repair      right  . A flutter ablation  02/22/2012  . Dilation and curettage of uterus    . Colonoscopy  05/2010, 07/2000  . Esophagogastroduodenoscopy  05/2010, 06/1993  . Cardioversion N/A 02/19/2014    Procedure: CARDIOVERSION;  Surgeon: Dorothy Spark, MD;  Location: [facility];  Service: Cardiovascular;  Laterality: N/A;  . Atrial flutter ablation N/A 02/22/2012    Procedure: ATRIAL FLUTTER ABLATION;  Surgeon: Thompson Grayer, MD;  Location: [facility];  Service: Cardiovascular;  Laterality: N/A;    Current Outpatient Prescriptions  Medication Sig Dispense Refill  . acetaminophen (TYLENOL) 500 MG tablet Take 1,000-1,500 mg by mouth 2 (two) times daily as needed for headache (migraines).    Marland Kitchen albuterol (PROVENTIL HFA;VENTOLIN HFA) 108 (90 BASE) MCG/ACT inhaler Inhale 2 puffs into the lungs every 4 (four) hours as needed for wheezing or shortness of breath. 18 g 5  . bisoprolol (ZEBETA) 10 MG tablet Take 1 tablet (10 mg total) by mouth daily. 30 tablet 2  . celecoxib (CELEBREX) 200 MG capsule Take 1 capsule every 3 days    . diltiazem (CARDIZEM CD) 180 MG 24 hr capsule Take 180 mg by mouth daily.    . flecainide (TAMBOCOR) 50 MG  tablet Take 1 tablet (50 mg total) by mouth 2 (two) times daily. 180 tablet 3  . furosemide (LASIX) 20 MG tablet Take 20 mg by mouth daily as needed for edema.     Marland Kitchen losartan (COZAAR) 100 MG tablet Take 1 tablet (100 mg total) by mouth daily. 30 tablet 2  . omeprazole (PRILOSEC) 40 MG capsule take 1 capsule by mouth once daily before BREAKFAST 90 capsule 0  . PARoxetine (PAXIL) 20 MG tablet Take 20 mg by mouth daily.    . rivaroxaban (XARELTO) 20 MG TABS tablet Take 1 tablet (20 mg total) by mouth daily with supper. 30 tablet 11  . SYMBICORT 80-4.5 MCG/ACT inhaler inhale 2 puffs by mouth twice a day Rinse mouth after  use 10.2 g 5   No current facility-administered medications for this visit.    Allergies  Allergen Reactions  . Epinephrine Other (See Comments)    Pallpitations during dental procedures  . Propoxyphene Hcl Nausea And Vomiting  . Shellfish Allergy Hives and Shortness Of Breath  . Theophyllines Nausea And Vomiting and Other (See Comments)    Heart races and pounds  . Ace Inhibitors Cough    History   Social History  . Marital Status: Widowed    Spouse Name: N/A  . Number of Children: 0  . Years of Education: N/A   Occupational History  . Accounting tech at [employer] History Main Topics  . Smoking status: Former Smoker -- 1.00 packs/day for 20 years    Types: Cigarettes    Quit date: 09/25/1984  . Smokeless tobacco: Never Used  . Alcohol Use: No  . Drug Use: No  . Sexual Activity: Not Currently   Other Topics Concern  . Not on file   Social History Narrative    Family History  Problem Relation Age of Onset  . Emphysema Father     smoker  . Heart disease Father   . Breast cancer Mother   . Breast cancer Sister   . Heart disease Sister   . Colon cancer Neg Hx     ROS- All systems are reviewed and negative except as per the HPI above  Physical Exam: Filed Vitals:   11/19/14 0936  BP: 130/58  Pulse: 69  Height: 5\' 5"  (1.651 m)  Weight: 257 lb (116.574 kg)    GEN- The patient is overweight appearing, alert and oriented x 3 today.   Head- normocephalic, atraumatic Eyes-  Sclera clear, conjunctiva pink Ears- hearing intact Oropharynx- clear Neck- supple, no JVP Lymph- no cervical lymphadenopathy Lungs- Clear with a prolonged expiratory phase Heart- Regular rate and rhythm, no murmurs, rubs or gallops, PMI not laterally displaced GI- soft, NT, ND, + BS Extremities- no clubbing, cyanosis, or edema MS- diffuse muscle atrophy Skin- diffuse small ecchymosis Psych- euthymic mood, full affect Neuro- strength and sensation are  intact  EKG today reveals sinus rhythm at 69 bpm, NSR, normal EKG, PR int 174 ms, QRS 29ms, QTc 462 ms.   Assessment and Plan:  1. Atrial fibrillation The patient  with recent afib for which she was cardioverted.  She is appropriately anticoagulated with xarelto (chads2vasc score is at least 3). and  flecainide 50mg  BID is maintaining NSR. This can be increased to 100mg  BID if her afib returns.  .  2. OSA Continue with use of CPAP.  3. SOB multifactoral Likely due to lung disease--> she follows with Dr Melvyn Novas Recent Brantley Fling was low risk  4. Obesity The importance of weight reduction was discussed today Body mass index is 42.77 kg/(m^2). She wants to start exercise program with Silver sneakers. Instructed to increase exercise slowly as tolerated.  Return in 6 months iDr. Allred

## 2014-11-19 NOTE — Telephone Encounter (Signed)
Patient notified of lab results. Patient states she has been chronically anemic her entire life and is followed by her PCP for this. She has colonoscopies every 5 years as well. All questions answered.

## 2014-11-24 HISTORY — PX: BREAST BIOPSY: SHX20

## 2014-12-15 ENCOUNTER — Ambulatory Visit: Payer: Medicare Other | Admitting: Internal Medicine

## 2014-12-24 ENCOUNTER — Other Ambulatory Visit: Payer: Self-pay | Admitting: Radiology

## 2014-12-24 DIAGNOSIS — C50912 Malignant neoplasm of unspecified site of left female breast: Secondary | ICD-10-CM

## 2014-12-28 ENCOUNTER — Ambulatory Visit
Admission: RE | Admit: 2014-12-28 | Discharge: 2014-12-28 | Disposition: A | Discharge status: Home / Self Care | Payer: Medicare Other | Source: Ambulatory Visit | Attending: Radiology | Admitting: Radiology

## 2014-12-28 DIAGNOSIS — C50912 Malignant neoplasm of unspecified site of left female breast: Secondary | ICD-10-CM

## 2014-12-28 MED ORDER — GADOBENATE DIMEGLUMINE 529 MG/ML IV SOLN
20.0000 mL | Freq: Once | INTRAVENOUS | Status: AC | PRN
  Administered 2014-12-28: 20 mL via INTRAVENOUS

## 2014-12-30 ENCOUNTER — Telehealth: Payer: Self-pay | Admitting: *Deleted

## 2014-12-30 NOTE — Telephone Encounter (Signed)
Received request from [facility] to call pt and schedule her for med onc.  Called and left a message for the pt to return my call so I can schedule her.

## 2014-12-31 ENCOUNTER — Telehealth (HOSPITAL_COMMUNITY): Payer: Self-pay | Admitting: Oncology

## 2014-12-31 NOTE — Telephone Encounter (Addendum)
Per conversation between Drs. Dalbert Batman and [place], patient was contacted to make an appointment at [facility] to see Dr. Whitney Muse, since we are closer to home for her.  A message was left requesting a call back for scheduling an appointment. Per Dr. Dalbert Batman, the patient reported to him that she is ill with a "stomach virus," and I will follow up with her again if no return call to the clinic.  12/31/2014 [name] returned phone call and scheduled to see Dr. Whitney Muse after her appointment with Dr. Dalbert Batman.

## 2015-01-01 ENCOUNTER — Ambulatory Visit: Payer: Medicare Other | Admitting: Internal Medicine

## 2015-01-05 ENCOUNTER — Ambulatory Visit: Payer: Medicare Other | Admitting: Internal Medicine

## 2015-01-06 ENCOUNTER — Other Ambulatory Visit: Payer: Self-pay | Admitting: General Surgery

## 2015-01-06 DIAGNOSIS — C50212 Malignant neoplasm of upper-inner quadrant of left female breast: Secondary | ICD-10-CM

## 2015-01-07 ENCOUNTER — Other Ambulatory Visit: Payer: Self-pay | Admitting: Internal Medicine

## 2015-01-11 ENCOUNTER — Telehealth: Payer: Self-pay | Admitting: Genetic Counselor

## 2015-01-11 ENCOUNTER — Other Ambulatory Visit: Payer: Self-pay | Admitting: General Surgery

## 2015-01-11 DIAGNOSIS — C50212 Malignant neoplasm of upper-inner quadrant of left female breast: Secondary | ICD-10-CM

## 2015-01-11 NOTE — Telephone Encounter (Signed)
genetic appt-left message for patient to return call to schedule genetic appt

## 2015-01-12 ENCOUNTER — Telehealth: Payer: Self-pay | Admitting: Internal Medicine

## 2015-01-12 ENCOUNTER — Telehealth: Payer: Self-pay | Admitting: Genetic Counselor

## 2015-01-12 NOTE — Telephone Encounter (Signed)
Clarise Cruz was not available so I Spoke with Conni Slipper regarding pt's surgical clearance . Pt needs to hold Xarelto also  medical clearance prior to scheduled a Partial mastectomy. Abigail Butts is aware that this message will be send to Dr. Rayann Heman and his nurse for clearance.

## 2015-01-12 NOTE — Telephone Encounter (Signed)
New message      Request for surgical clearance:  What type of surgery is being performed?  Partial mastectomy 1. When is this surgery scheduled? Pending clearance  2. Are there any medications that need to be held prior to surgery and how long? xarelto and medical clearance  3. Name of physician performing surgery? Dr Dalbert Batman  4. What is your office phone and fax number? Fax [phone]

## 2015-01-12 NOTE — Telephone Encounter (Signed)
Called pt and scheduled new patient gen counseling appt.    [place] Gen Counselor 01/21/15 10 am  Dx: Genetic Testing Referring: Dr. Dalbert Batman

## 2015-01-13 NOTE — Telephone Encounter (Signed)
Pt has a CHADS score of 2.  No history of TIA/CVA.  Per Dr. Festus Barren, he requested pt hold Xarelto x 3 days.  Okay per protocol to hold x 3 days.  Will fax clearance to his office.

## 2015-01-14 ENCOUNTER — Ambulatory Visit (HOSPITAL_COMMUNITY): Payer: Medicare Other | Admitting: Hematology & Oncology

## 2015-01-21 ENCOUNTER — Other Ambulatory Visit: Payer: Medicare Other

## 2015-01-22 ENCOUNTER — Encounter (HOSPITAL_COMMUNITY): Payer: Medicare Other | Attending: Hematology & Oncology | Admitting: Hematology & Oncology

## 2015-01-22 ENCOUNTER — Encounter (HOSPITAL_COMMUNITY): Payer: Self-pay | Admitting: Hematology & Oncology

## 2015-01-22 VITALS — BP 126/53 | HR 58 | Temp 98.4°F | Resp 20 | Ht 65.0 in | Wt 266.0 lb

## 2015-01-22 DIAGNOSIS — C50212 Malignant neoplasm of upper-inner quadrant of left female breast: Secondary | ICD-10-CM

## 2015-01-22 DIAGNOSIS — N183 Chronic kidney disease, stage 3 (moderate): Secondary | ICD-10-CM

## 2015-01-22 DIAGNOSIS — L03116 Cellulitis of left lower limb: Secondary | ICD-10-CM

## 2015-01-22 DIAGNOSIS — D649 Anemia, unspecified: Secondary | ICD-10-CM | POA: Diagnosis not present

## 2015-01-22 DIAGNOSIS — Z170 Estrogen receptor positive status [ER+]: Secondary | ICD-10-CM | POA: Diagnosis not present

## 2015-01-22 DIAGNOSIS — C50912 Malignant neoplasm of unspecified site of left female breast: Secondary | ICD-10-CM

## 2015-01-22 MED ORDER — CEPHALEXIN 500 MG PO CAPS: 500.0000 mg | ORAL_CAPSULE | Freq: Three times a day (TID) | ORAL | Status: DC

## 2015-01-22 NOTE — Patient Instructions (Addendum)
[place] at [facility] Discharge Instructions  RECOMMENDATIONS MADE BY THE CONSULTANT AND ANY TEST RESULTS WILL BE SENT TO YOUR REFERRING PHYSICIAN.  Exam and discussion by Dr. Whitney Muse. Per Dr. Darrel Hoover office, Cardiology wants you to hold your xarelto for 3 days prior to your surgery.  Dr. Dalbert Batman will let you know when to restart it after surgery. Keflex - take as directed  Will get your tumor sent for oncotyping  Follow-up in 3 weeks.   Thank you for choosing [place] at [facility] to provide your oncology and hematology care.  To afford each patient quality time with our provider, please arrive at least 15 minutes before your scheduled appointment time.    You need to re-schedule your appointment should you arrive 10 or more minutes late.  We strive to give you quality time with our providers, and arriving late affects you and other patients whose appointments are after yours.  Also, if you no show three or more times for appointments you may be dismissed from the clinic at the providers discretion.     Again, thank you for choosing [facility].  Our hope is that these requests will decrease the amount of time that you wait before being seen by our physicians.       _____________________________________________________________  Should you have questions after your visit to [facility], please contact our office at [phone] between the hours of 8:30 a.m. and 4:30 p.m.  Voicemails left after 4:30 p.m. will not be returned until the following business day.  For prescription refill requests, have your pharmacy contact our office.

## 2015-01-22 NOTE — Progress Notes (Signed)
[name] CONSULT NOTE  Patient Care Team: [name], MD as PCP - General (Internal Medicine)  CHIEF COMPLAINTS/PURPOSE OF CONSULTATION:  Left Breast cancer ER + (100%) PR+ (99%) Ki-67 45% HER-2/NEU by CISH Negative. Note that the tumor appears to show heterogeneity in regards to HER 2 expression.  HISTORY OF PRESENTING ILLNESS:  [name] 64 y.o. female is here because of newly diagnosed Left breast cancer. She is scheduled for a partial mastectomy with sentinel node procedure with Dr. Dalbert Batman on 01/26/2015.  She always thought that she would get cancer as she notes both her mother and sister had breast cancer.  He had a mammogram on 12/15/2014 that showed a new 4 mm round nodule in the left breast. It was indeterminate. Ultrasound was performed that confirmed the findings. 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 Note that HER 2 was negative but heterogeneous. Breast MRI was performed on 12/28/2014 that noted the abnormalitity in the upper inner quadrant of the Left breast with post-biopsy change. Remainder of exam was benign.  She went for a second surgical opinion in [place] at the prompting of her sister. She states she "was told nothing different that she already knew." She has an appointment with genetics given her family history.  She is concerned that "something is wrong with her" because she feels relatively calm about her diagnosis.  She notes chronic SOB that is "worse on humid days." She wears a CPAP at night.Regularly gets colonoscopy and endoscopy.  MEDICAL HISTORY:  Past Medical History  Diagnosis Date  . Asthma     Spirometry 1989: FEV1 2.12 (71%) with ratio 82.  HFA 90% after coaching 11-09-10  . GERD (gastroesophageal reflux disease)     EGD with HH / esophagitis 06-30-93  . Osteoarthritis   . DDD (degenerative disc disease)   . Pelvic fracture   . Hiatal  hernia   . Migraine   . Anxiety disorder   . Anemia   . Diverticulosis   . Adenomatous colon polyp 1993  . Atrial flutter chadsvasc of 3 (11/19/14)    Status post cardioversion 2-12 2013. s/p RFCA 5/13 with JAllred (coumadin and amio stopped)   . Essential hypertension, benign   . Pneumonia   . Fibromyalgia   . Lipoma of colon   . Internal and external hemorrhoids without complication   . Breast cancer     left  . PONV (postoperative nausea and vomiting)   . Sleep apnea     cpap  . Anxiety   . History of atrial fibrillation     SURGICAL HISTORY: Past Surgical History  Procedure Laterality Date  . Total knee arthroplasty      x2 09/2006  . Rotator cuff repair      bilateral  . A flutter ablation  02/22/2012  . Dilation and curettage of uterus    . Colonoscopy  05/2010, 07/2000  . Esophagogastroduodenoscopy  05/2010, 06/1993  . Cardioversion N/A 02/19/2014    Procedure: CARDIOVERSION;  Surgeon: Dorothy Spark, MD;  Location: [facility];  Service: Cardiovascular;  Laterality: N/A;  . Atrial flutter ablation N/A 02/22/2012    Procedure: ATRIAL FLUTTER ABLATION;  Surgeon: Thompson Grayer, MD;  Location: [facility] CATH LAB;  Service: Cardiovascular;  Laterality: N/A;  . Breast biopsy Left 11/2014  . Joint replacement    . Rotator cuff repair      bil  .  Radioactive seed guided mastectomy with axillary sentinel lymph node biopsy Left 01/26/2015    Procedure: LEFT  PARTIAL MASTECTOMY WITH  RADIOACTIVE SEED LOCALIZATION, LEFT AXILLARY SENTINEL LYMPH NODE BIOPSY;  Surgeon: Fanny Skates, MD;  Location: [place] [facility];  Service: General;  Laterality: Left;    SOCIAL HISTORY: History   Social History  . Marital Status: Widowed    Spouse Name: N/A  . Number of Children: 0  . Years of Education: N/A   Occupational History  . 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 Smoking status: Former Smoker -- 1.00 packs/day for 20 years    Types: Cigarettes    Quit date:  09/25/1984  . Smokeless tobacco: Never Used  . Alcohol Use: Yes     Comment: occ  . Drug Use: No  . Sexual Activity: Not Currently   Other Topics Concern  . 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 He has been widowed for 13 years. She was married for 1 year and 4 months and her husband past. She had been previously married and states she had infertility issues. She was a smoker and quit smoking 30 years ago. She notes she smoked a pack a day for 20 years.  FAMILY HISTORY: Family History  Problem Relation Age of Onset  . Emphysema Father     smoker  . Heart disease Father   . Breast cancer Mother   . Breast cancer Sister   . Heart disease Sister   . Colon cancer Neg Hx    has no family status information on file.  Her mother died at the age of 60 from pneumonia. Her mother had breast cancer at the age of 32. Her sister also had breast cancer at the age of 77. She died from heart disease at the age of 71 however. He states her sister did have genetic testing and was told it was negative. 2 brothers are deceased from lung cancer. Her father died at 64 from COPD. She has one sister who is living and healthy. Maternal Aunt had breast cancer at the age of 43.  ALLERGIES:  is allergic to shellfish allergy; ace inhibitors; epinephrine; theophyllines; and propoxyphene hcl.  MEDICATIONS:  Current Outpatient Prescriptions  Medication Sig Dispense Refill  . acetaminophen (TYLENOL) 500 MG tablet Take 1,000-1,500 mg by mouth 2 (two) times daily as needed for headache (migraines).    Marland Kitchen albuterol (PROVENTIL HFA;VENTOLIN HFA) 108 (90 BASE) MCG/ACT inhaler Inhale 2 puffs into the lungs every 4 (four) hours as needed for wheezing or shortness of breath. 18 g 5  . bisoprolol (ZEBETA) 10 MG tablet Take 1 tablet (10 mg total) by mouth daily. 30 tablet 2  . diltiazem (CARDIZEM CD) 180 MG  24 hr capsule Take 180 mg by mouth daily.    . flecainide (TAMBOCOR) 50 MG tablet Take 1 tablet (50 mg total) by mouth 2 (two) times daily. 180 tablet 3  . fluticasone (FLONASE) 50 MCG/ACT nasal spray Place 2 sprays into both nostrils daily as needed for allergies.   0  . omeprazole (PRILOSEC) 40 MG capsule take 1 capsule by mouth once daily before BREAKFAST 90 capsule 0  . PARoxetine (PAXIL) 20 MG tablet Take 20 mg by mouth daily.    . rivaroxaban (XARELTO) 20 MG TABS tablet Take 1 tablet (20  mg total) by mouth daily with supper. 30 tablet 11  . SSD 1 % cream Apply 1 application topically 2 (two) times daily as needed (wound).   0  . SYMBICORT 80-4.5 MCG/ACT inhaler inhale 2 puffs by mouth twice a day Rinse mouth after use 10.2 g 5  . amoxicillin-clavulanate (AUGMENTIN) 500-125 MG per tablet Take 1 tablet (500 mg total) by mouth 3 (three) times daily. 14 tablet 0  . oxyCODONE-acetaminophen (ROXICET) 5-325 MG per tablet Take 1 tablet by mouth every 4 (four) hours as needed for severe pain. 30 tablet 0   No current facility-administered medications for this visit.    ROS  Review of Systems  Constitutional: Negative for fever, chills, weight loss and malaise/fatigue.  HENT: Negative for congestion, hearing loss, nosebleeds, sore throat and tinnitus.   Eyes: Negative for blurred vision, double vision, pain and discharge.  Respiratory: Negative for cough, hemoptysis, sputum production, shortness of breath and wheezing.   Cardiovascular: Negative for chest pain, palpitations, claudication, leg swelling and PND.  Gastrointestinal: Negative for heartburn, nausea, vomiting, abdominal pain, diarrhea, constipation, blood in stool and melena.  Genitourinary: Negative for dysuria, urgency, frequency and hematuria.  Musculoskeletal: Negative for falls. Positive for joint and back pain Skin: Negative for itching and rash.  Neurological: Negative for dizziness, tingling, tremors, sensory change, speech  change, focal weakness, seizures, loss of consciousness, weakness. Positive for migraines.  Endo/Heme/Allergies: Does not bruise/bleed easily.  Psychiatric/Behavioral: Negative for depression, suicidal ideas, memory loss and substance abuse. The patient is not nervous/anxious and does not have insomnia.   All other systems reviewed and are negative.   PHYSICAL EXAMINATION: ECOG PERFORMANCE STATUS: 1 - Symptomatic but completely ambulatory  Filed Vitals:   01/22/15 0813  BP: 126/53  Pulse: 58  Temp: 98.4 F (36.9 C)  Resp: 20   Filed Weights   01/22/15 0813  Weight: 266 lb (120.657 kg)     Physical Exam  Constitutional: She is oriented to person, place, and time and well-developed, well-nourished, and in no distress.  HENT:  Head: Normocephalic and atraumatic.  Nose: Nose normal.  Mouth/Throat: Oropharynx is clear and moist. No oropharyngeal exudate.  Eyes: Conjunctivae and EOM are normal. Pupils are equal, round, and reactive to light. Right eye exhibits no discharge. Left eye exhibits no discharge. No scleral icterus.  Neck: Normal range of motion. Neck supple. No tracheal deviation present. No thyromegaly present.  Cardiovascular: Normal rate, regular rhythm and normal heart sounds.  Exam reveals no gallop and no friction rub.   No murmur heard. Pulmonary/Chest: Effort normal and breath sounds normal. She has no wheezes. She has no rales.  Abdominal: Soft. Bowel sounds are normal. She exhibits no distension and no mass. There is no tenderness. There is no rebound and no guarding.  Musculoskeletal: Normal range of motion. She exhibits no edema.  Mild LLE warmth and erythema.   Lymphadenopathy:    She has no cervical adenopathy.  Neurological: She is alert and oriented to person, place, and time. She has normal reflexes. No cranial nerve deficit. Gait normal. Coordination normal.  Skin: Skin is warm and dry. No rash noted.  Psychiatric: Mood, memory, affect and judgment  normal.  Nursing note and vitals reviewed. Breast EXAM: No palpable abnormality in either breast. Both axillary regions without palpable abnormalities  LABORATORY DATA:  I have reviewed the data as listed  Results for Shelley, Murphy (MRN [id number]) as of 02/17/2015 11:04  Ref. Range 11/19/2014 10:26  Sodium Latest Ref Range:  135-145 mEq/L 137  Potassium Latest Ref Range: 3.5-5.1 mEq/L 3.9  Chloride Latest Ref Range: 96-112 mEq/L 98  CO2 Latest Ref Range: 19-32 mEq/L 31  BUN Latest Ref Range: 6-23 mg/dL 17  Creatinine Latest Ref Range: 0.40-1.20 mg/dL 1.27 (H)  Calcium Latest Ref Range: 8.4-10.5 mg/dL 9.5  Glucose Latest Ref Range: 70-99 mg/dL 135 (H)  GFR Latest Ref Range: >60.00 mL/min 44.73 (L)  WBC Latest Ref Range: 4.0-10.5 K/uL 8.7  RBC Latest Ref Range: 3.87-5.11 Mil/uL 3.72 (L)  Hemoglobin Latest Ref Range: 12.0-15.0 g/dL 9.7 (L)  HCT Latest Ref Range: 36.0-46.0 % 29.9 (L)  MCV Latest Ref Range: 78.0-100.0 fl 80.5  MCHC Latest Ref Range: 30.0-36.0 g/dL 32.6  RDW Latest Ref Range: 11.5-15.5 % 18.2 (H)  Platelets Latest Ref Range: 150.0-400.0 K/uL 432.0 (H)  Neutrophils Latest Ref Range: 43.0-77.0 % 68.6  Lymphocytes Latest Ref Range: 12.0-46.0 % 22.0  Monocytes Relative Latest Ref Range: 3.0-12.0 % 6.7  Eosinophil Latest Ref Range: 0.0-5.0 % 1.9  Basophil Latest Ref Range: 0.0-3.0 % 0.8  NEUT# Latest Ref Range: 1.4-7.7 K/uL 6.0  Lymphocyte # Latest Ref Range: 0.7-4.0 K/uL 1.9  Monocyte # Latest Ref Range: 0.1-1.0 K/uL 0.6  Eosinophils Absolute Latest Ref Range: 0.0-0.7 K/uL 0.2  Basophils Absolute Latest Ref Range: 0.0-0.1 K/uL 0.1   RADIOGRAPHIC STUDIES: I have personally reviewed the radiological images as listed and agreed with the findings in the report.  12/28/2014   CLINICAL DATA: Recently diagnosed invasive ductal carcinoma in the  medial left breast. Strong family history of breast cancer including  the patient's mother diagnosed at age 55, sister  diagnosed at age 61  and maternal aunt diagnosed at age 39.  LABS: BUN and creatinine were obtained on site at [facility] at  [address]  Results: BUN 11 mg/dL, Creatinine 0.9 mg/dL.  EXAM:  BILATERAL BREAST MRI WITH AND WITHOUT CONTRAST   IMPRESSION:  2.6 x 1.2 x 1.0 cm ill-defined, elongated, oval area of enhancement  in the upper inner quadrant of the left breast at the location of  the recently biopsied malignancy. This is compatible with a  combination of malignancy and post biopsy change. The majority of  this most likely represents post biopsy change. Otherwise,  unremarkable examination.   RECOMMENDATION:  Treatment plan.   BI-RADS CATEGORY 6: Known biopsy-proven malignancy.   Electronically Signed  By: Claudie Revering M.D.  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 She was provided with reading information from the NCCN and also given out patient navigation book about breast cancer.  I spent time discussing ER positivity and PR positivity; ie. The significance of these markers and treatments used for ER + tumors. We discussed endocrine therapy in some detail.  I address her Her-2 status.   We discussed the course of treatment: surgery, chemotherapy if needed, radiation and then endocrine therapy.  I discussed the use of the ONCOTYPE assay in regards to determining benefit from chemotherapy in women with ER+ tumors without significant LN involvement.   We will order an ONCOTYPE on her tumor after we follow-up with her final surgical pathology. Her surgery is scheduled. I answered her questions and advised her to contact [facility] our patient navigator if she has needs prior to her next visit.  She has an underlying anemia  and CKD. She has not had a more extensive anemia evaluation and we  will certainly address this moving forward.  She is on Xarelto. She has been instructed to hold this 24 hours prior to surgery. She has evidence of early LLE cellulitis, She will be given a course of Keflex and has been advised to call if she does not improve.  All questions were answered. The patient knows to call the clinic with any problems, questions or concerns.  This note was electronically signed.    This document serves as a record of services personally performed by Ancil Linsey, MD. It was created on her behalf by Pearlie Oyster, a trained medical scribe. The creation of this record is based on the scribe's personal observations and the provider's statements to them. This document has been checked and approved by the attending provider.      I have reviewed the above documentation for accuracy and completeness and I agree with the above.  Molli Hazard, MD

## 2015-01-22 NOTE — Progress Notes (Deleted)
[facility] CONSULT NOTE  Patient Care Team: [name], MD as PCP - General (Internal Medicine)  CHIEF COMPLAINTS/PURPOSE OF CONSULTATION:  Breast Cancer  HISTORY OF PRESENTING ILLNESS:  [name] 64 y.o. female is here because of  Newly diagnosed breast cancer.  She is scheduled for a partial mastectomy with sentinel node procedure with Dr. Dalbert Batman on 01/26/2015.    No history exists.     MEDICAL HISTORY:  Past Medical History  Diagnosis Date  . Asthma     Spirometry 1989: FEV1 2.12 (71%) with ratio 82.  HFA 90% after coaching 11-09-10  . COPD (chronic obstructive pulmonary disease)   . GERD (gastroesophageal reflux disease)     EGD with HH / esophagitis 06-30-93  . Osteoarthritis   . DDD (degenerative disc disease)   . Pelvic fracture   . Hiatal hernia   . Migraine   . Anxiety disorder   . Anemia   . Diverticulosis   . Adenomatous colon polyp 1993  . Atrial flutter chadsvasc of 3 (11/19/14)    Status post cardioversion 2-12 2013. s/p RFCA 5/13 with JAllred (coumadin and amio stopped)   . Essential hypertension, benign   . Pneumonia   . Sleep apnea   . Fibromyalgia   . Lipoma of colon   . Internal and external hemorrhoids without complication     SURGICAL HISTORY: Past Surgical History  Procedure Laterality Date  . Total knee arthroplasty      x2 09/2006  . Rotator cuff repair      right  . A flutter ablation  02/22/2012  . Dilation and curettage of uterus    . Colonoscopy  05/2010, 07/2000  . Esophagogastroduodenoscopy  05/2010, 06/1993  . Cardioversion N/A 02/19/2014    Procedure: CARDIOVERSION;  Surgeon: Dorothy Spark, MD;  Location: [facility];  Service: Cardiovascular;  Laterality: N/A;  . Atrial flutter ablation N/A 02/22/2012    Procedure: ATRIAL FLUTTER ABLATION;  Surgeon: Thompson Grayer, MD;  Location: [facility] CATH LAB;  Service: Cardiovascular;  Laterality: N/A;    SOCIAL HISTORY: History   Social History  . Marital Status:  Widowed    Spouse Name: N/A  . Number of Children: 0  . Years of Education: N/A   Occupational History  . 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 Smoking status: Former Smoker -- 1.00 packs/day for 20 years    Types: Cigarettes    Quit date: 09/25/1984  . Smokeless tobacco: Never Used  . Alcohol Use: No  . Drug Use: No  . Sexual Activity: Not Currently   Other Topics Concern  . Not on file   Social History Narrative    FAMILY HISTORY: Family History  Problem Relation Age of Onset  . Emphysema Father     smoker  . Heart disease Father   . Breast cancer Mother   . Breast cancer Sister   . Heart disease Sister   . Colon cancer Neg Hx    has no family status information on file.   ALLERGIES:  is allergic to epinephrine; propoxyphene hcl; shellfish allergy; theophyllines; and ace inhibitors.  MEDICATIONS:  Current Outpatient Prescriptions  Medication Sig Dispense Refill  . acetaminophen (TYLENOL) 500 MG tablet Take 1,000-1,500 mg by mouth 2 (two) times daily as needed for headache (migraines).    Marland Kitchen albuterol (PROVENTIL HFA;VENTOLIN HFA) 108 (90 BASE) MCG/ACT inhaler Inhale 2 puffs into the lungs every 4 (  four) hours as needed for wheezing or shortness of breath. 18 g 5  . ALPRAZolam (XANAX) 0.5 MG tablet   0  . bisoprolol (ZEBETA) 10 MG tablet Take 1 tablet (10 mg total) by mouth daily. 30 tablet 2  . celecoxib (CELEBREX) 200 MG capsule Take 1 capsule every 3 days    . diltiazem (CARDIZEM CD) 180 MG 24 hr capsule Take 180 mg by mouth daily.    . flecainide (TAMBOCOR) 50 MG tablet Take 1 tablet (50 mg total) by mouth 2 (two) times daily. 180 tablet 3  . fluticasone (FLONASE) 50 MCG/ACT nasal spray   0  . furosemide (LASIX) 20 MG tablet Take 20 mg by mouth daily as needed for edema.     . hydrOXYzine (ATARAX/VISTARIL) 25 MG tablet   0  . losartan (COZAAR) 100 MG tablet Take 1 tablet (100 mg total) by mouth daily. 30 tablet 2  .  omeprazole (PRILOSEC) 40 MG capsule take 1 capsule by mouth once daily before BREAKFAST 90 capsule 0  . PARoxetine (PAXIL) 20 MG tablet Take 20 mg by mouth daily.    . promethazine (PHENERGAN) 25 MG tablet   0  . rivaroxaban (XARELTO) 20 MG TABS tablet Take 1 tablet (20 mg total) by mouth daily with supper. 30 tablet 11  . SSD 1 % cream   0  . SYMBICORT 80-4.5 MCG/ACT inhaler inhale 2 puffs by mouth twice a day Rinse mouth after use 10.2 g 5   No current facility-administered medications for this visit.    ROS  PHYSICAL EXAMINATION: ECOG PERFORMANCE STATUS: {CHL ONC ECOG PS:[id number]}  There were no vitals filed for this visit. There were no vitals filed for this visit.   Physical Exam   LABORATORY DATA:  I have reviewed the data as listed Lab Results  Component Value Date   WBC 8.7 11/19/2014   HGB 9.7* 11/19/2014   HCT 29.9* 11/19/2014   MCV 80.5 11/19/2014   PLT 432.0* 11/19/2014     Chemistry      Component Value Date/Time   NA 137 11/19/2014 1026   K 3.9 11/19/2014 1026   CL 98 11/19/2014 1026   CO2 31 11/19/2014 1026   BUN 17 11/19/2014 1026   CREATININE 1.27* 11/19/2014 1026      Component Value Date/Time   CALCIUM 9.5 11/19/2014 1026       RADIOGRAPHIC STUDIES: I have personally reviewed the radiological images as listed and agreed with the findings in the report.   12/28/2014  CLINICAL DATA: Recently diagnosed invasive ductal carcinoma in the medial left breast. Strong family history of breast cancer including the patient's mother diagnosed at age 58, sister diagnosed at age 73 and maternal aunt diagnosed at age 66.  LABS: BUN and creatinine were obtained on site at [facility] at  [address]  Results: BUN 11 mg/dL, Creatinine 0.9 mg/dL.  EXAM: BILATERAL BREAST MRI WITH AND WITHOUT CONTRAST    IMPRESSION: 2.6 x 1.2 x 1.0 cm ill-defined, elongated, oval area of enhancement in the upper inner quadrant of the left  breast at the location of the recently biopsied malignancy. This is compatible with a combination of malignancy and post biopsy change. The majority of this most likely represents post biopsy change. Otherwise, unremarkable examination.  RECOMMENDATION: Treatment plan.  BI-RADS CATEGORY 6: Known biopsy-proven malignancy.   Electronically Signed  By: Claudie Revering M.D.  On: 12/31/2014 10:21  ASSESSMENT & PLAN:  No problem-specific assessment & plan notes  found for this encounter.  No orders of the defined types were placed in this encounter.    All questions were answered. The patient knows to call the clinic with any problems, questions or concerns.  I spent {CHL ONC TIME VISIT - WVXUC:[id number]} counseling the patient face to face. The total time spent in the appointment was {CHL ONC TIME VISIT - JYLTE:[id number]} and more than 50% was on counseling.  This note was electronically signed.    Molli Hazard, MD MD 01/22/2015 7:53 AM

## 2015-01-25 ENCOUNTER — Other Ambulatory Visit: Payer: Self-pay

## 2015-01-25 ENCOUNTER — Ambulatory Visit (HOSPITAL_COMMUNITY)
Admission: RE | Admit: 2015-01-25 | Discharge: 2015-01-25 | Disposition: A | Discharge status: Home / Self Care | Payer: Medicare Other | Source: Ambulatory Visit | Attending: General Surgery | Admitting: General Surgery

## 2015-01-25 ENCOUNTER — Encounter (HOSPITAL_COMMUNITY)
Admission: RE | Admit: 2015-01-25 | Discharge: 2015-01-25 | Disposition: A | Discharge status: Home / Self Care | Payer: Medicare Other | Source: Ambulatory Visit | Attending: General Surgery | Admitting: General Surgery

## 2015-01-25 ENCOUNTER — Encounter (HOSPITAL_COMMUNITY): Payer: Self-pay

## 2015-01-25 DIAGNOSIS — I10 Essential (primary) hypertension: Secondary | ICD-10-CM | POA: Diagnosis not present

## 2015-01-25 DIAGNOSIS — Z96653 Presence of artificial knee joint, bilateral: Secondary | ICD-10-CM | POA: Diagnosis not present

## 2015-01-25 DIAGNOSIS — Z170 Estrogen receptor positive status [ER+]: Secondary | ICD-10-CM | POA: Diagnosis not present

## 2015-01-25 DIAGNOSIS — Z87891 Personal history of nicotine dependence: Secondary | ICD-10-CM | POA: Diagnosis not present

## 2015-01-25 DIAGNOSIS — Z803 Family history of malignant neoplasm of breast: Secondary | ICD-10-CM | POA: Diagnosis not present

## 2015-01-25 DIAGNOSIS — Z6841 Body Mass Index (BMI) 40.0 and over, adult: Secondary | ICD-10-CM | POA: Diagnosis not present

## 2015-01-25 DIAGNOSIS — J45909 Unspecified asthma, uncomplicated: Secondary | ICD-10-CM | POA: Diagnosis not present

## 2015-01-25 DIAGNOSIS — Z7901 Long term (current) use of anticoagulants: Secondary | ICD-10-CM | POA: Diagnosis not present

## 2015-01-25 DIAGNOSIS — F419 Anxiety disorder, unspecified: Secondary | ICD-10-CM | POA: Diagnosis not present

## 2015-01-25 DIAGNOSIS — M797 Fibromyalgia: Secondary | ICD-10-CM | POA: Diagnosis not present

## 2015-01-25 DIAGNOSIS — I4891 Unspecified atrial fibrillation: Secondary | ICD-10-CM | POA: Diagnosis not present

## 2015-01-25 DIAGNOSIS — Z01818 Encounter for other preprocedural examination: Secondary | ICD-10-CM

## 2015-01-25 DIAGNOSIS — K219 Gastro-esophageal reflux disease without esophagitis: Secondary | ICD-10-CM | POA: Diagnosis not present

## 2015-01-25 DIAGNOSIS — C50212 Malignant neoplasm of upper-inner quadrant of left female breast: Secondary | ICD-10-CM | POA: Diagnosis present

## 2015-01-25 DIAGNOSIS — Z7951 Long term (current) use of inhaled steroids: Secondary | ICD-10-CM | POA: Diagnosis not present

## 2015-01-25 DIAGNOSIS — J449 Chronic obstructive pulmonary disease, unspecified: Secondary | ICD-10-CM | POA: Diagnosis not present

## 2015-01-25 DIAGNOSIS — Z79899 Other long term (current) drug therapy: Secondary | ICD-10-CM | POA: Diagnosis not present

## 2015-01-25 DIAGNOSIS — M1990 Unspecified osteoarthritis, unspecified site: Secondary | ICD-10-CM | POA: Diagnosis not present

## 2015-01-25 LAB — COMPREHENSIVE METABOLIC PANEL
ALBUMIN: 3.8 g/dL (ref 3.5–5.0)
ALT: 19 U/L (ref 14–54)
ANION GAP: 13 (ref 5–15)
AST: 23 U/L (ref 15–41)
Alkaline Phosphatase: 73 U/L (ref 38–126)
BUN: 15 mg/dL (ref 6–20)
CHLORIDE: 101 mmol/L (ref 101–111)
CO2: 25 mmol/L (ref 22–32)
Calcium: 9.5 mg/dL (ref 8.9–10.3)
Creatinine, Ser: 1.24 mg/dL — ABNORMAL HIGH (ref 0.44–1.00)
GFR calc Af Amer: 51 mL/min — ABNORMAL LOW (ref >60)
GFR calc non Af Amer: 44 mL/min — ABNORMAL LOW (ref >60)
GLUCOSE: 154 mg/dL — AB (ref 70–99)
POTASSIUM: 3.5 mmol/L (ref 3.5–5.1)
SODIUM: 139 mmol/L (ref 135–145)
TOTAL PROTEIN: 8.2 g/dL — AB (ref 6.5–8.1)
Total Bilirubin: 0.5 mg/dL (ref 0.3–1.2)

## 2015-01-25 LAB — CBC WITH DIFFERENTIAL/PLATELET
BASOS PCT: 1 % (ref 0–1)
Basophils Absolute: 0.1 10*3/uL (ref 0.0–0.1)
EOS PCT: 2 % (ref 0–5)
Eosinophils Absolute: 0.2 10*3/uL (ref 0.0–0.7)
HCT: 30.2 % — ABNORMAL LOW (ref 36.0–46.0)
Hemoglobin: 9.2 g/dL — ABNORMAL LOW (ref 12.0–15.0)
Lymphocytes Relative: 19 % (ref 12–46)
Lymphs Abs: 1.7 10*3/uL (ref 0.7–4.0)
MCH: 25.5 pg — AB (ref 26.0–34.0)
MCHC: 30.5 g/dL (ref 30.0–36.0)
MCV: 83.7 fL (ref 78.0–100.0)
MONO ABS: 0.7 10*3/uL (ref 0.1–1.0)
Monocytes Relative: 8 % (ref 3–12)
NEUTROS PCT: 70 % (ref 43–77)
Neutro Abs: 6.3 10*3/uL (ref 1.7–7.7)
Platelets: 403 10*3/uL — ABNORMAL HIGH (ref 150–400)
RBC: 3.61 MIL/uL — ABNORMAL LOW (ref 3.87–5.11)
RDW: 16.6 % — ABNORMAL HIGH (ref 11.5–15.5)
WBC: 9 10*3/uL (ref 4.0–10.5)

## 2015-01-25 LAB — APTT: aPTT: 32 seconds (ref 24–37)

## 2015-01-25 LAB — PROTIME-INR
INR: 1.18 (ref 0.00–1.49)
PROTHROMBIN TIME: 15.2 s (ref 11.6–15.2)

## 2015-01-25 MED ORDER — CHLORHEXIDINE GLUCONATE 4 % EX LIQD
1.0000 "application " | Freq: Once | CUTANEOUS | Status: DC
  Filled 2015-01-25: qty 15

## 2015-01-25 MED ORDER — DEXTROSE 5 % IV SOLN
2.0000 g | INTRAVENOUS | Status: DC
  Filled 2015-01-25: qty 2

## 2015-01-25 MED ORDER — DEXTROSE 5 % IV SOLN
3.0000 g | INTRAVENOUS | Status: AC
  Administered 2015-01-26: 3 g via INTRAVENOUS
  Filled 2015-01-25: qty 3000

## 2015-01-25 NOTE — H&P (Signed)
[name] Che  Location: [facility] Patient #: [id number] DOB: [birth year] Widowed / Language: Cleophus Murphy / Race: White Female        History of Present Illness  .  The patient is a 64 year old female who presents with breast cancer. She is referred by Dr. Elige Radon at [facility] mammography for evaluation and treatment of a newly diagnosed invasive cancer of the left breast, upper inner quadrant. She has never had a breast problem before. She gets annual mammograms. Recent screening mammogram show a 6 mm mass in the left breast medially. Image guided biopsy shows an invasive mammary carcinoma, hormone receptor positive, HER-2 negative but heterogeneous. Subsequent MRI showed a lot of biopsy change in this area up to 2.6 x 1.2 cm. This was a solitary finding. This was felt to be in the upper inner left breast. No other findings in either breast. No adenopathy. Medical oncology referral has already been made to Dr. Ancil Linsey at [facility], and she has an appointment on April 21. She has an appointment for a second surgical opinion in [facility] (01/19/2015), where her sister's breast cancer surgeon is. She states that she would like to have her surgery here, however. She is interested in genetic testing, and we have made that referral. Family history is positive for breast cancer in her mother age 36. Recurred with metastatic disease 5 years later. Sister diagnosed with breast cancer age 57 and she is doing well. Maternal aunt diagnosed with breast cancer age 20 and she is doing okay. No family history of ovarian cancer. The patient has 2 sisters. Patient lives in [place]. She is a widow. Drives her own car. She has no children. She quit smoking tobacco 30 years ago. Significant comorbidities including COPD followed by Dr. Melvyn Novas. Not oxygen dependent. Sleep apnea followed by Dr. Gwenette Greet. Asthma. Fibromyalgia. GERD.  History paroxysmal atrial fibrillation, status post ablation. Now on diltiazem, flecainide, and Xarelto She is in sinus rhythm, most likely, today. Anxiety. Obesity with BMI 41. History of bilateral total knee replacements, walks with a cane. Drives her own car. We talked about her surgical options including partial mastectomy, sentinel node biopsy, mastectomy with or without reconstruction. We talked about the statistical implications of positive genetic mutation. It is her very clear preference to undergo left partial mastectomy and sentinel node biopsy and radiation therapy and to be followed closely from here on out. She is not interested in mastectomy. I agree.  She is interested in genetic testing and counseling and we have made that referral today. She is going to be tentatively scheduled for left partial mastectomy with radioactive seed localization, left axillary sentinel node biopsy in early May.  I discussed the indications, details, techniques, and numerous risk of the surgery with her. She is aware of the risk of bleeding, infection, cosmetic deformity, reoperation for positive margins, reoperation for positive nodes, arm swelling, arm numbness, and other unforeseen problems. She is aware of the risk of cardiac, pulmonary, and thromboembolic problems. She understands all these issues well. She has good insight. All of her questions are answered. She agrees with this plan.   Other Problems  Anxiety Disorder Arthritis Asthma Atrial Fibrillation Back Pain Breast Cancer Cholelithiasis Chronic Obstructive Lung Disease Depression Gastroesophageal Reflux Disease General anesthesia - complications High blood pressure Migraine Headache Sleep Apnea  Past Surgical History  Breast Biopsy Left. Colon Polyp Removal - Open Foot Surgery Left. Knee Surgery Bilateral. Oral Surgery Shoulder Surgery  Bilateral.  Diagnostic Studies History [name], CMA; 01/06/2015 4:16 PM) Colonoscopy 1-5 years ago Mammogram 1-3 years ago  Allergies  Epinephrine & Chlorpheniramine *VASOPRESSORS* Propoxyphene Compound *ANALGESICS - OPIOID* ACE Inhibitors Sulfa Antibiotics Theophylline *ANTIASTHMATIC AND BRONCHODILATOR AGENTS*  Medication History  ALPRAZolam (0.5MG Tablet, Oral) Active. Bisoprolol Fumarate (10MG Tablet, Oral) Active. PARoxetine HCl (20MG Tablet, Oral) Active. Losartan Potassium (100MG Tablet, Oral) Active. Omeprazole (40MG Capsule DR, Oral) Active. ProAir HFA (108 (90 Base)MCG/ACT Aerosol Soln, Inhalation) Active. Symbicort (80-4.5MCG/ACT Aerosol, Inhalation) Active. TiZANidine HCl (4MG Tablet, Oral) Active. Xarelto (20MG Tablet, Oral) Active. Diltiazem HCl ER Coated Beads (180MG Capsule ER 24HR, Oral) Active. Furosemide (20MG Tablet, Oral) Active. Fluticasone Propionate (50MCG/ACT Suspension, Nasal) Active. Celecoxib (200MG Capsule, Oral) Active. Medications Reconciled  Social History  carbonated beverages, Tea. No alcohol use No drug use Tobacco use Former smoker.  Family History  Alcohol Abuse Family Members In General, Father. Arthritis Father, Mother, Sister. Bleeding disorder Mother. Breast Cancer Mother, Sister. Colon Cancer Family Members In General. Depression Family Members In General, Father. Diabetes Mellitus Family Members In General. Hypertension Mother. Migraine Headache Father, Sister. Respiratory Condition Father.  Pregnancy / Birth History  Age at menarche 47 years. Age of menopause 29-55 Gravida 0 Irregular periods Para 0  Review of Systems  General Present- Fatigue and Weight Gain. Not Present- Appetite Loss, Chills, Fever, Night Sweats and Weight Loss. Skin Present- Dryness and Non-Healing Wounds. Not Present- Change in Wart/Mole, Hives, Jaundice, New Lesions, Rash and Ulcer. HEENT Present- Wears glasses/contact lenses. Not Present- Earache,  Hearing Loss, Hoarseness, Nose Bleed, Oral Ulcers, Ringing in the Ears, Seasonal Allergies, Sinus Pain, Sore Throat, Visual Disturbances and Yellow Eyes. Respiratory Present- Chronic Cough, Difficulty Breathing, Snoring and Wheezing. Not Present- Bloody sputum. Gastrointestinal Present- Hemorrhoids. Not Present- Abdominal Pain, Bloating, Bloody Stool, Change in Bowel Habits, Chronic diarrhea, Constipation, Difficulty Swallowing, Excessive gas, Gets full quickly at meals, Indigestion, Nausea, Rectal Pain and Vomiting. Musculoskeletal Present- Back Pain and Joint Pain. Not Present- Joint Stiffness, Muscle Pain, Muscle Weakness and Swelling of Extremities. Neurological Present- Headaches and Trouble walking. Not Present- Decreased Memory, Fainting, Numbness, Seizures, Tingling, Tremor and Weakness. Psychiatric Present- Anxiety and Depression. Not Present- Bipolar, Change in Sleep Pattern, Fearful and Frequent crying.   Vitals   Weight: 248 lb Height: 65in Body Surface Area: 2.27 m Body Mass Index: 41.27 kg/m Temp.: 97.81F(Temporal)  Pulse: 72 (Regular)  BP: 148/80 (Sitting, Left Arm, Standard)    Physical Exam General Mental Status-Alert. General Appearance-Consistent with stated age. Hydration-Well hydrated. Voice-Normal. Note: Alert and pleasant. Walks with a cane. Morbidly obese. Very friendly and cooperative.   Head and Neck Head-normocephalic, atraumatic with no lesions or palpable masses. Trachea-midline. Thyroid Gland Characteristics - normal size and consistency.  Eye Eyeball - Bilateral-Extraocular movements intact. Sclera/Conjunctiva - Bilateral-No scleral icterus.  Chest and Lung Exam Chest and lung exam reveals -quiet, even and easy respiratory effort with no use of accessory muscles and on auscultation, normal breath sounds, no adventitious sounds and normal vocal resonance. Inspection Chest Wall - Normal. Back - normal. Note:  Breath sounds are slightly distant, but no wheezes or rhonchi.   Breast Breast - Left-Symmetric, Non Tender, No Biopsy scars, no Dimpling, No Inflammation, No Lumpectomy scars, No Mastectomy scars, No Peau d' Orange. Breast - Right-Symmetric, Non Tender, No Biopsy scars, no Dimpling, No Inflammation, No Lumpectomy scars, No Mastectomy scars, No Peau d' Orange. Breast Lump-No Palpable Breast Mass. Note: Breasts are large, somewhat pendulous. Ptotic. No palpable mass. No hematoma. No  skin change. Nipple and areola are normal. No axillary adenopathy.   Cardiovascular Cardiovascular examination reveals -normal heart sounds, regular rate and rhythm with no murmurs and normal pedal pulses bilaterally. Note: Regular rate and rhythm without murmur or ectopy. Suspect she is in sinus rhythm today.   Abdomen Inspection Inspection of the abdomen reveals - No Hernias. Skin - Scar - no surgical scars. Palpation/Percussion Palpation and Percussion of the abdomen reveal - Soft, Non Tender, No Rebound tenderness, No Rigidity (guarding) and No hepatosplenomegaly. Auscultation Auscultation of the abdomen reveals - Bowel sounds normal.  Neurologic Neurologic evaluation reveals -alert and oriented x 3 with no impairment of recent or remote memory. Mental Status-Normal.  Musculoskeletal Normal Exam - Left-Upper Extremity Strength Normal and Lower Extremity Strength Normal. Normal Exam - Right-Upper Extremity Strength Normal and Lower Extremity Strength Normal.  Lymphatic Head & Neck  General Head & Neck Lymphatics: Bilateral - Description - Normal. Axillary  General Axillary Region: Bilateral - Description - Normal. Tenderness - Non Tender. Femoral & Inguinal  Generalized Femoral & Inguinal Lymphatics: Bilateral - Description - Normal. Tenderness - Non Tender.    Assessment & Plan  PRIMARY CANCER OF UPPER INNER QUADRANT OF LEFT FEMALE BREAST (174.2  C50.212) Current  Plans  Schedule for Surgery You have a small, probably stage I, invasive cancer of your left breast in the upper inner quadrant. You are a good candidate for breast conservation surgery, and you have stated that is your preference. You have a strong family history for breast cancer, and you'll be referred for genetic counseling and genetic testing. We have discussed the implications of a genetic mutation that is specific for breast cancer.Marland Kitchen Keep your appointment with Dr. Whitney Muse, the medical oncologist at [facility], on April 21 You are getting a second opinion with your sister's oncologist in [place] on April 26, and I encourage that. You have stated that she would like to go ahead and tentatively schedule surgery with me in early May. My office will guide you through scheduling for left partial mastectomy with radioactive seed localization, and left axillary sentinel lymph node biopsy; probably sometime in early May. You will likely be offered radiation therapy to the left breast postop. You state that you are comfortable with that. We have discussed the indications, techniques, and numerous risk of this surgery in detail. You will need to stop the Xarelto blood thinner 3 days prior to the surgery, and we will check with Dr. Rayann Heman to make sure that he is okay with that . COPD, MODERATE (496  J44.9) Impression: Followed by Dr. Christinia Gully  BMI 40.0-44.9, ADULT (V85.41  Z68.41)  PAROXYSMAL ATRIAL FIBRILLATION (427.31  I48.0) Impression: On diltiazem, flecainide, xarelto Seems to be in sinus rhythm today. Followed by Dr. Rayann Heman  CHRONIC GERD (530.81  K21.9)  HISTORY OF RADIOFREQUENCY ABLATION PROCEDURE FOR CARDIAC ARRHYTHMIA (V15.1  Z98.89)  SLEEP APNEA IN ADULT (828.83  G47.33) Impression: Followed by Dr. Danton Sewer  HISTORY OF KNEE REPLACEMENT, TOTAL, BILATERAL (V43.65  Z96.653)    Edsel Petrin. Dalbert Batman, M.D., [facility] General and Minimally invasive Surgery Breast and Colorectal Surgery Office:   [phone] Pager:   [phone]

## 2015-01-25 NOTE — Pre-Procedure Instructions (Signed)
SHEKELIA BOUTIN  01/25/2015   Your procedure is scheduled on:  01/26/15  Report to [facility] Admitting at 530 AM.  Call this number if you have problems the morning of surgery: [phone]   Remember:   Do not eat food or drink liquids after midnight.   Take these medicines the morning of surgery with A SIP OF WATER: all inhalers,xanax,cardizem,prilosec,paxil,bisoprolol   Do not wear jewelry, make-up or nail polish.  Do not wear lotions, powders, or perfumes. You may wear deodorant.  Do not shave 48 hours prior to surgery. Men may shave face and neck.  Do not bring valuables to the hospital.  [facility] is not responsible                  for any belongings or valuables.               Contacts, dentures or bridgework may not be worn into surgery.  Leave suitcase in the car. After surgery it may be brought to your room.  For patients admitted to the hospital, discharge time is determined by your                treatment team.               Patients discharged the day of surgery will not be allowed to drive  home.  Name and phone number of your driver: family  Special Instructions: Shower using CHG 2 nights before surgery and the night before surgery.  If you shower the day of surgery use CHG.  Use special wash - you have one bottle of CHG for all showers.  You should use approximately 1/3 of the bottle for each shower.   Please read over the following fact sheets that you were given: Pain Booklet, Coughing and Deep Breathing and Surgical Site Infection Prevention

## 2015-01-25 NOTE — Progress Notes (Addendum)
Anesthesia Chart Review:  PCP is Dr. Woody Seller. Cardiologist is Dr. Rayann Heman. Pulmonologist is Dr. Gwenette Greet.   Pt is 64 year old female scheduled for L partial mastectomy with radioactive seed localization, L axillary sentinel lymph node biopsy on 01/26/2015 with Dr. Dalbert Batman.   PMH includes: HTN, atrial flutter, OSA, COPD, asthma, anemia, breast cancer. Former smoker. BMI 43. S/p cardioversion 02/19/14. S/p atrial flutter ablation 02/22/12.   Medications include: flecainide, bisoprolol, diltiazem, losartan, xarelto, keflex (pt was prescribed keflex by Dr. Whitney Muse, her oncologist at 01/22/15 office visit; note is incomplete, I'm not sure why pt is taking this). Stopped xarelto 01/22/15.   Preoperative labs reviewed.  H/H 9.2/30.2. Pt hgb in range of 9.4-10.5 for past year. Hx of anemia. Per Epic telephone encounter by Rushie Goltz, RN of the afib clinic dated 11/19/2014, pt reports chronic anemia and is followed by pcp for this.    Chest x-ray reviewed. Low-grade CHF superimposed upon COPD. Since the previous study the pulmonary vascularity and interstitium have become more conspicuous.   EKG 11/19/2014: NSR.  Nuclear stress test 05/28/2014: Low risk stress nuclear study with a small, moderate intensity, fixed anterior defect consistent with soft tissue attenuation; no ischemia. LV Ejection Fraction: 67%. LV Wall Motion: NL LV Function; NL Wall Motion.  Echo 02/20/2014: - Left ventricle: The cavity size was normal. Wall thickness was increased in a pattern of mild LVH. Systolic function was normal. The estimated ejection fraction was in the range of 60% to 65%. Wall motion was normal; there were no regional wall motion abnormalities. - Left atrium: The atrium was mildly dilated. - Right atrium: The atrium was mildly dilated.  Discussed case with Dr. Conrad [place]. Pt will need to be evaluated DOS by her assigned anesthesiologist.   Willeen Cass, FNP-BC [facility]/Anesthesiology Phone:  [phone] 01/25/2015 4:15 PM

## 2015-01-25 NOTE — Telephone Encounter (Signed)
Received fax from [facility] in [place] requesting omeprazole.  Faxed back to them [phone] saying denied, needs office visit.  When she calls we can send in enough to cover until office visit.

## 2015-01-26 ENCOUNTER — Encounter (HOSPITAL_COMMUNITY): Payer: Self-pay | Admitting: *Deleted

## 2015-01-26 ENCOUNTER — Ambulatory Visit (HOSPITAL_COMMUNITY): Payer: Medicare Other | Admitting: Certified Registered"

## 2015-01-26 ENCOUNTER — Encounter (HOSPITAL_COMMUNITY)
Admission: RE | Disposition: A | Discharge status: Home / Self Care | Payer: Self-pay | Source: Ambulatory Visit | Attending: General Surgery

## 2015-01-26 ENCOUNTER — Encounter (HOSPITAL_COMMUNITY)
Admission: RE | Admit: 2015-01-26 | Discharge: 2015-01-26 | Disposition: A | Discharge status: Home / Self Care | Payer: Medicare Other | Source: Ambulatory Visit | Attending: General Surgery | Admitting: General Surgery

## 2015-01-26 ENCOUNTER — Ambulatory Visit (HOSPITAL_COMMUNITY)
Admission: RE | Admit: 2015-01-26 | Discharge: 2015-01-26 | Disposition: A | Discharge status: Home / Self Care | Payer: Medicare Other | Source: Ambulatory Visit | Attending: General Surgery | Admitting: General Surgery

## 2015-01-26 DIAGNOSIS — I10 Essential (primary) hypertension: Secondary | ICD-10-CM | POA: Insufficient documentation

## 2015-01-26 DIAGNOSIS — Z170 Estrogen receptor positive status [ER+]: Secondary | ICD-10-CM | POA: Diagnosis not present

## 2015-01-26 DIAGNOSIS — J449 Chronic obstructive pulmonary disease, unspecified: Secondary | ICD-10-CM | POA: Diagnosis not present

## 2015-01-26 DIAGNOSIS — J45909 Unspecified asthma, uncomplicated: Secondary | ICD-10-CM | POA: Diagnosis not present

## 2015-01-26 DIAGNOSIS — F419 Anxiety disorder, unspecified: Secondary | ICD-10-CM | POA: Insufficient documentation

## 2015-01-26 DIAGNOSIS — Z6841 Body Mass Index (BMI) 40.0 and over, adult: Secondary | ICD-10-CM | POA: Insufficient documentation

## 2015-01-26 DIAGNOSIS — M797 Fibromyalgia: Secondary | ICD-10-CM | POA: Insufficient documentation

## 2015-01-26 DIAGNOSIS — Z79899 Other long term (current) drug therapy: Secondary | ICD-10-CM | POA: Insufficient documentation

## 2015-01-26 DIAGNOSIS — M1990 Unspecified osteoarthritis, unspecified site: Secondary | ICD-10-CM | POA: Insufficient documentation

## 2015-01-26 DIAGNOSIS — Z7901 Long term (current) use of anticoagulants: Secondary | ICD-10-CM | POA: Insufficient documentation

## 2015-01-26 DIAGNOSIS — C50212 Malignant neoplasm of upper-inner quadrant of left female breast: Secondary | ICD-10-CM | POA: Diagnosis present

## 2015-01-26 DIAGNOSIS — Z96653 Presence of artificial knee joint, bilateral: Secondary | ICD-10-CM | POA: Insufficient documentation

## 2015-01-26 DIAGNOSIS — I4891 Unspecified atrial fibrillation: Secondary | ICD-10-CM | POA: Insufficient documentation

## 2015-01-26 DIAGNOSIS — Z87891 Personal history of nicotine dependence: Secondary | ICD-10-CM | POA: Insufficient documentation

## 2015-01-26 DIAGNOSIS — K219 Gastro-esophageal reflux disease without esophagitis: Secondary | ICD-10-CM | POA: Insufficient documentation

## 2015-01-26 DIAGNOSIS — Z7951 Long term (current) use of inhaled steroids: Secondary | ICD-10-CM | POA: Insufficient documentation

## 2015-01-26 DIAGNOSIS — Z803 Family history of malignant neoplasm of breast: Secondary | ICD-10-CM | POA: Insufficient documentation

## 2015-01-26 HISTORY — DX: Personal history of other diseases of the circulatory system: Z86.79

## 2015-01-26 HISTORY — PX: RADIOACTIVE SEED GUIDED PARTIAL MASTECTOMY WITH AXILLARY SENTINEL LYMPH NODE BIOPSY: SHX6520

## 2015-01-26 SURGERY — RADIOACTIVE SEED GUIDED PARTIAL MASTECTOMY WITH AXILLARY SENTINEL LYMPH NODE BIOPSY
Anesthesia: Regional | Site: Breast | Laterality: Left

## 2015-01-26 MED ORDER — PROPOFOL 10 MG/ML IV BOLUS
INTRAVENOUS | Status: DC | PRN
  Administered 2015-01-26: 160 mg via INTRAVENOUS

## 2015-01-26 MED ORDER — OXYCODONE HCL 5 MG/5ML PO SOLN: 5.0000 mg | Freq: Once | ORAL | Status: DC | PRN

## 2015-01-26 MED ORDER — OXYCODONE HCL 5 MG PO TABS
5.0000 mg | ORAL_TABLET | ORAL | Status: DC | PRN
  Administered 2015-01-26: 10 mg via ORAL

## 2015-01-26 MED ORDER — SCOPOLAMINE 1 MG/3DAYS TD PT72
MEDICATED_PATCH | TRANSDERMAL | Status: AC
  Filled 2015-01-26: qty 1

## 2015-01-26 MED ORDER — DEXAMETHASONE SODIUM PHOSPHATE 4 MG/ML IJ SOLN
INTRAMUSCULAR | Status: DC | PRN
  Administered 2015-01-26: 4 mg via INTRAVENOUS

## 2015-01-26 MED ORDER — DEXAMETHASONE SODIUM PHOSPHATE 10 MG/ML IJ SOLN
INTRAMUSCULAR | Status: AC
  Filled 2015-01-26: qty 1

## 2015-01-26 MED ORDER — ONDANSETRON HCL 4 MG/2ML IJ SOLN: 4.0000 mg | Freq: Four times a day (QID) | INTRAMUSCULAR | Status: DC | PRN

## 2015-01-26 MED ORDER — BUPIVACAINE HCL 0.25 % IJ SOLN
INTRAMUSCULAR | Status: DC | PRN
  Administered 2015-01-26: 15 mL

## 2015-01-26 MED ORDER — BUPIVACAINE HCL (PF) 0.25 % IJ SOLN
INTRAMUSCULAR | Status: AC
  Filled 2015-01-26: qty 30

## 2015-01-26 MED ORDER — SODIUM CHLORIDE 0.9 % IJ SOLN: 3.0000 mL | Freq: Two times a day (BID) | INTRAMUSCULAR | Status: DC

## 2015-01-26 MED ORDER — GLYCOPYRROLATE 0.2 MG/ML IJ SOLN
INTRAMUSCULAR | Status: DC | PRN
  Administered 2015-01-26: .8 mg via INTRAVENOUS

## 2015-01-26 MED ORDER — HYDROMORPHONE HCL 1 MG/ML IJ SOLN: 0.2500 mg | INTRAMUSCULAR | Status: DC | PRN

## 2015-01-26 MED ORDER — BUPIVACAINE-EPINEPHRINE (PF) 0.5% -1:200000 IJ SOLN
INTRAMUSCULAR | Status: DC | PRN
  Administered 2015-01-26: 25 mL via PERINEURAL

## 2015-01-26 MED ORDER — BUPIVACAINE-EPINEPHRINE (PF) 0.25% -1:200000 IJ SOLN
INTRAMUSCULAR | Status: AC
  Filled 2015-01-26: qty 30

## 2015-01-26 MED ORDER — SCOPOLAMINE 1 MG/3DAYS TD PT72
MEDICATED_PATCH | TRANSDERMAL | Status: DC | PRN
  Administered 2015-01-26: 1 via TRANSDERMAL

## 2015-01-26 MED ORDER — SODIUM CHLORIDE 0.9 % IJ SOLN: 3.0000 mL | INTRAMUSCULAR | Status: DC | PRN

## 2015-01-26 MED ORDER — LIDOCAINE HCL (CARDIAC) 20 MG/ML IV SOLN
INTRAVENOUS | Status: AC
  Filled 2015-01-26: qty 5

## 2015-01-26 MED ORDER — PROPOFOL 10 MG/ML IV BOLUS
INTRAVENOUS | Status: AC
  Filled 2015-01-26: qty 20

## 2015-01-26 MED ORDER — ROCURONIUM BROMIDE 100 MG/10ML IV SOLN
INTRAVENOUS | Status: DC | PRN
  Administered 2015-01-26: 50 mg via INTRAVENOUS

## 2015-01-26 MED ORDER — ROCURONIUM BROMIDE 50 MG/5ML IV SOLN
INTRAVENOUS | Status: AC
  Filled 2015-01-26: qty 1

## 2015-01-26 MED ORDER — MIDAZOLAM HCL 5 MG/5ML IJ SOLN
INTRAMUSCULAR | Status: DC | PRN
  Administered 2015-01-26: 2 mg via INTRAVENOUS

## 2015-01-26 MED ORDER — NEOSTIGMINE METHYLSULFATE 10 MG/10ML IV SOLN
INTRAVENOUS | Status: DC | PRN
  Administered 2015-01-26: 5 mg via INTRAVENOUS

## 2015-01-26 MED ORDER — SODIUM CHLORIDE 0.9 % IV SOLN: INTRAVENOUS | Status: DC

## 2015-01-26 MED ORDER — ONDANSETRON HCL 4 MG/2ML IJ SOLN
INTRAMUSCULAR | Status: DC | PRN
  Administered 2015-01-26: 4 mg via INTRAVENOUS

## 2015-01-26 MED ORDER — EPHEDRINE SULFATE 50 MG/ML IJ SOLN
INTRAMUSCULAR | Status: DC | PRN
  Administered 2015-01-26 (×3): 10 mg via INTRAVENOUS

## 2015-01-26 MED ORDER — ONDANSETRON HCL 4 MG/2ML IJ SOLN
INTRAMUSCULAR | Status: AC
  Filled 2015-01-26: qty 2

## 2015-01-26 MED ORDER — MIDAZOLAM HCL 2 MG/2ML IJ SOLN
INTRAMUSCULAR | Status: AC
  Filled 2015-01-26: qty 2

## 2015-01-26 MED ORDER — 0.9 % SODIUM CHLORIDE (POUR BTL) OPTIME
TOPICAL | Status: DC | PRN
  Administered 2015-01-26: 1000 mL

## 2015-01-26 MED ORDER — OXYCODONE HCL 5 MG PO TABS: 5.0000 mg | ORAL_TABLET | Freq: Once | ORAL | Status: DC | PRN

## 2015-01-26 MED ORDER — OXYCODONE HCL 5 MG PO TABS
ORAL_TABLET | ORAL | Status: AC
  Filled 2015-01-26: qty 2

## 2015-01-26 MED ORDER — ACETAMINOPHEN 325 MG PO TABS: 650.0000 mg | ORAL_TABLET | ORAL | Status: DC | PRN

## 2015-01-26 MED ORDER — SODIUM CHLORIDE 0.9 % IV SOLN: 250.0000 mL | INTRAVENOUS | Status: DC | PRN

## 2015-01-26 MED ORDER — FENTANYL CITRATE (PF) 250 MCG/5ML IJ SOLN
INTRAMUSCULAR | Status: AC
  Filled 2015-01-26: qty 5

## 2015-01-26 MED ORDER — TECHNETIUM TC 99M SULFUR COLLOID FILTERED
1.0000 | Freq: Once | INTRAVENOUS | Status: AC | PRN
  Administered 2015-01-26: 1 via INTRADERMAL

## 2015-01-26 MED ORDER — METHYLENE BLUE 1 % INJ SOLN
INTRAMUSCULAR | Status: AC
  Filled 2015-01-26: qty 10

## 2015-01-26 MED ORDER — LACTATED RINGERS IV SOLN
INTRAVENOUS | Status: DC | PRN
  Administered 2015-01-26 (×2): via INTRAVENOUS

## 2015-01-26 MED ORDER — FENTANYL CITRATE (PF) 100 MCG/2ML IJ SOLN: 25.0000 ug | INTRAMUSCULAR | Status: DC | PRN

## 2015-01-26 MED ORDER — ALBUTEROL SULFATE (2.5 MG/3ML) 0.083% IN NEBU
INHALATION_SOLUTION | RESPIRATORY_TRACT | Status: AC
  Administered 2015-01-26: 2.5 mg
  Filled 2015-01-26: qty 3

## 2015-01-26 MED ORDER — FENTANYL CITRATE (PF) 100 MCG/2ML IJ SOLN
INTRAMUSCULAR | Status: DC | PRN
  Administered 2015-01-26 (×4): 50 ug via INTRAVENOUS

## 2015-01-26 MED ORDER — ACETAMINOPHEN 650 MG RE SUPP: 650.0000 mg | RECTAL | Status: DC | PRN

## 2015-01-26 MED ORDER — SODIUM CHLORIDE 0.9 % IJ SOLN
INTRAMUSCULAR | Status: DC | PRN
  Administered 2015-01-26: 5 mL

## 2015-01-26 MED ORDER — LIDOCAINE HCL (CARDIAC) 20 MG/ML IV SOLN
INTRAVENOUS | Status: DC | PRN
  Administered 2015-01-26: 50 mg via INTRAVENOUS

## 2015-01-26 MED ORDER — HYDROCODONE-ACETAMINOPHEN 5-325 MG PO TABS: 1.0000 | ORAL_TABLET | Freq: Four times a day (QID) | ORAL | Status: DC | PRN

## 2015-01-26 SURGICAL SUPPLY — 53 items
APPLIER CLIP 9.375 MED OPEN (MISCELLANEOUS) ×3
BINDER BREAST LRG (GAUZE/BANDAGES/DRESSINGS) IMPLANT
BINDER BREAST XLRG (GAUZE/BANDAGES/DRESSINGS) IMPLANT
BINDER BREAST XXLRG (GAUZE/BANDAGES/DRESSINGS) ×3 IMPLANT
BLADE SURG 15 STRL LF DISP TIS (BLADE) ×1 IMPLANT
BLADE SURG 15 STRL SS (BLADE) ×2
CANISTER SUCTION 2500CC (MISCELLANEOUS) ×3 IMPLANT
CHLORAPREP W/TINT 26ML (MISCELLANEOUS) ×3 IMPLANT
CLIP APPLIE 9.375 MED OPEN (MISCELLANEOUS) ×1 IMPLANT
CONT SPEC 4OZ CLIKSEAL STRL BL (MISCELLANEOUS) ×6 IMPLANT
COVER PROBE W GEL 5X96 (DRAPES) ×3 IMPLANT
COVER SURGICAL LIGHT HANDLE (MISCELLANEOUS) ×3 IMPLANT
DERMABOND ADVANCED (GAUZE/BANDAGES/DRESSINGS) ×2
DERMABOND ADVANCED .7 DNX12 (GAUZE/BANDAGES/DRESSINGS) ×1 IMPLANT
DEVICE DUBIN SPECIMEN MAMMOGRA (MISCELLANEOUS) ×3 IMPLANT
DRAPE CHEST BREAST 15X10 FENES (DRAPES) ×3 IMPLANT
DRAPE UTILITY XL STRL (DRAPES) ×3 IMPLANT
DRSG PAD ABDOMINAL 8X10 ST (GAUZE/BANDAGES/DRESSINGS) ×3 IMPLANT
ELECT CAUTERY BLADE 6.4 (BLADE) ×3 IMPLANT
ELECT REM PT RETURN 9FT ADLT (ELECTROSURGICAL) ×3
ELECTRODE REM PT RTRN 9FT ADLT (ELECTROSURGICAL) ×1 IMPLANT
GLOVE BIO SURGEON STRL SZ7 (GLOVE) ×3 IMPLANT
GLOVE BIO SURGEON STRL SZ7.5 (GLOVE) ×3 IMPLANT
GLOVE BIOGEL PI IND STRL 7.0 (GLOVE) ×1 IMPLANT
GLOVE BIOGEL PI INDICATOR 7.0 (GLOVE) ×2
GLOVE EUDERMIC 7 POWDERFREE (GLOVE) ×6 IMPLANT
GOWN STRL REUS W/ TWL LRG LVL3 (GOWN DISPOSABLE) ×1 IMPLANT
GOWN STRL REUS W/ TWL XL LVL3 (GOWN DISPOSABLE) ×1 IMPLANT
GOWN STRL REUS W/TWL LRG LVL3 (GOWN DISPOSABLE) ×2
GOWN STRL REUS W/TWL XL LVL3 (GOWN DISPOSABLE) ×2
KIT BASIN OR (CUSTOM PROCEDURE TRAY) ×3 IMPLANT
KIT MARKER MARGIN INK (KITS) ×3 IMPLANT
NDL SAFETY ECLIPSE 18X1.5 (NEEDLE) ×1 IMPLANT
NEEDLE HYPO 18GX1.5 SHARP (NEEDLE) ×2
NEEDLE HYPO 25X1 1.5 SAFETY (NEEDLE) ×6 IMPLANT
NS IRRIG 1000ML POUR BTL (IV SOLUTION) IMPLANT
PACK SURGICAL SETUP 50X90 (CUSTOM PROCEDURE TRAY) ×3 IMPLANT
PENCIL BUTTON HOLSTER BLD 10FT (ELECTRODE) ×3 IMPLANT
SPONGE GAUZE 4X4 12PLY STER LF (GAUZE/BANDAGES/DRESSINGS) ×3 IMPLANT
SPONGE LAP 18X18 X RAY DECT (DISPOSABLE) ×3 IMPLANT
SUT MNCRL AB 4-0 PS2 18 (SUTURE) ×6 IMPLANT
SUT SILK 2 0 SH (SUTURE) ×3 IMPLANT
SUT VIC AB 2-0 SH 27 (SUTURE) ×4
SUT VIC AB 2-0 SH 27XBRD (SUTURE) ×2 IMPLANT
SUT VIC AB 3-0 SH 18 (SUTURE) ×3 IMPLANT
SUT VIC AB 3-0 SH 27 (SUTURE) ×4
SUT VIC AB 3-0 SH 27X BRD (SUTURE) ×2 IMPLANT
SYR CONTROL 10ML LL (SYRINGE) ×6 IMPLANT
TOWEL OR 17X24 6PK STRL BLUE (TOWEL DISPOSABLE) IMPLANT
TOWEL OR 17X26 10 PK STRL BLUE (TOWEL DISPOSABLE) ×3 IMPLANT
TUBE CONNECTING 12'X1/4 (SUCTIONS) ×1
TUBE CONNECTING 12X1/4 (SUCTIONS) ×2 IMPLANT
YANKAUER SUCT BULB TIP NO VENT (SUCTIONS) ×3 IMPLANT

## 2015-01-26 NOTE — Op Note (Signed)
Patient Name:           [name]   Date of Surgery:        01/26/2015  Pre op Diagnosis:      Invasive mammary carcinoma left breast, upper inner quadrant, estrogen receptor positive, HER-2 negative. Clinical stage IA  Post op Diagnosis:    Same  Procedure:                 Inject blue dye left breast                                          Left partial mastectomy with radioactive seed localization                                      Left axillary sentinel node biopsy                   Surgeon:                     Edsel Petrin. Dalbert Batman, M.D., FACS  Assistant:                      OR staff  Operative Indications:   The patient is a 64 year old female who presents with breast cancer. She is referred by Dr. Elige Radon at [facility] mammography for evaluation and treatment of a newly diagnosed invasive cancer of the left breast, upper inner quadrant.  Recent screening mammogram show a 6 mm mass in the left breast medially. Image guided biopsy shows an invasive mammary carcinoma, hormone receptor positive, HER-2 negative but heterogeneous. Subsequent MRI showed a lot of biopsy change in this area up to 2.6 x 1.2 cm. This was a solitary finding. . No other findings in either breast. No adenopathy. Medical oncology referral has already been made to Dr. Ancil Linsey at [facility], and she has an appointment on April 21.  Family history is positive for breast cancer in her mother age 108. Recurred with metastatic disease 5 years later. Sister diagnosed with breast cancer age 58 and she is doing well. Maternal aunt diagnosed with breast cancer age 34 and she is doing okay. No family history of ovarian cancer. The patient has 2 sisters. Patient lives in [place].  Significant comorbidities including COPD followed by Dr. Melvyn Novas. Not oxygen dependent. Sleep apnea followed by Dr. Gwenette Greet. Asthma. Fibromyalgia. GERD. History paroxysmal atrial fibrillation, status post  ablation. Now on diltiazem, flecainide, and Xarelto . Anxiety. Obesity with BMI 41.  It is her very clear preference to undergo left partial mastectomy and sentinel node biopsy and radiation therapy and to be followed closely from here on out. She is not interested in mastectomy.   She is interested in genetic testing and counseling and we have made that referral . She is brought to the operating room electively  Operative Findings:       I was able to hear the I-125 radioactive signal in the left breast upper-outer quadrant in the preop holding area. The specimen mammogram showed a single radioactive seed in a single marker clip within the center of the specimen. There was no palpable abnormality in the left breast lumpectomy specimen. I found 3 sentinel lymph nodes in the left  axilla.  Procedure in Detail:          In the holding area the patient underwent injection of technetium 99 by the nuclear medicine technician. Identified the I-125 signal in the holding area with the neoprobe. The patient was taken the operating room and underwent general anesthesia with LMA device. Surgical timeout was performed. Intravenous antibiotics were given. Following alcohol prep I injected 5 mL of blue dye into the left breast subareolar area. This was methylene blue mixed with saline. I massaged the breast for a few minutes.     The left breast and axilla were then prepped and draped in a sterile fashion. 0.5% Marcaine was used as local infiltration anesthetic. Using the neoprobe I identified the area of maximum radioactivity in the left breast upper inner quadrant. I made a curvilinear circumareolar incision at this point. Using the neoprobe I dissected down into the breast widely around the radioactive signal. The specimen was removed and marked with silk sutures and the 6 color ink kit  To orient the pathologist. Specimen mammogram looked good as described above. The wound was irrigated with saline.  Hemostasis was excellent. Metal marker clips were placed in  the lumpectomy cavity. The deeper breast tissues were closed in layers with interrupted sutures of 3-0 Vicryl and the skin was closed with a running subcuticular suture of 4-0 Monocryl and Dermabond.    I then switched the neoprobe over to Tc- 99. I made a transverse incision in the left axilla at the hairline. I dissected down through subcutaneous tissue and incised the clavipectoral fascia. Using the neoprobe I mapped out and removed 3 sentinel lymph nodes. These have blue dye and radioactivity in them. They were sent for routine histology. Hemostasis was excellent. The wound was irrigated with saline. The deeper tissues were closed with 3-0 Vicryl sutures and the skin closed with a running subcutaneous taken of 4-0 Monocryl and Dermabond. Breast binder was placed and the patient was taken to PACU in stable condition. EBL 20 mL. Counts correct. Complications none.     Edsel Petrin. Dalbert Batman, M.D., FACS General and Minimally Invasive Surgery Breast and Colorectal Surgery  01/26/2015 9:03 AM

## 2015-01-26 NOTE — Transfer of Care (Signed)
Immediate Anesthesia Transfer of Care Note  Patient: [name]  Procedure(s) Performed: Procedure(s): LEFT  PARTIAL MASTECTOMY WITH  RADIOACTIVE SEED LOCALIZATION, LEFT AXILLARY SENTINEL LYMPH NODE BIOPSY (Left)  Patient Location: PACU  Anesthesia Type:General  Level of Consciousness: awake, alert  and oriented  Airway & Oxygen Therapy: Patient Spontanous Breathing and Patient connected to face mask oxygen  Post-op Assessment: Report given to RN and Post -op Vital signs reviewed and stable  Post vital signs: Reviewed and stable  Last Vitals:  Filed Vitals:   01/26/15 0653  BP: 130/54  Pulse: 64  Temp: 36.6 C  Resp: 18    Complications: No apparent anesthesia complications

## 2015-01-26 NOTE — Interval H&P Note (Signed)
History and Physical Interval Note:  01/26/2015 6:27 AM  [name]  has presented today for surgery, with the diagnosis of caner left breast  The goals and the  various methods of treatment have been discussed with the patient and family. After consideration of risks, benefits and other options for treatment, the patient has consented to  Procedure(s): LEFT  PARTIAL MASTECTOMY WITH  RADIOACTIVE SEED LOCALIZATION, LEFT AXILLARY SENTINEL LYMPH NODE BIOPSY (Left) as a surgical intervention .  The patient's history has been reviewed, patient examined today , no change in status, stable for surgery.  I have reviewed the patient's chart and labs.  Questions were answered to the patient's satisfaction.     Adin Hector

## 2015-01-26 NOTE — Anesthesia Preprocedure Evaluation (Signed)
Anesthesia Evaluation  Patient identified by MRN, date of birth, ID band Patient awake    Reviewed: Allergy & Precautions, NPO status , Patient's Chart, lab work & pertinent test results  History of Anesthesia Complications (+) PONV and history of anesthetic complications  Airway Mallampati: II   Neck ROM: full    Dental   Pulmonary shortness of breath, asthma , sleep apnea , COPDformer smoker,  breath sounds clear to auscultation        Cardiovascular hypertension, + dysrhythmias Atrial Fibrillation Rhythm:regular Rate:Normal     Neuro/Psych  Headaches, Anxiety  Neuromuscular disease    GI/Hepatic hiatal hernia, GERD-  ,  Endo/Other  diabetes, Type 2Morbid obesity  Renal/GU      Musculoskeletal  (+) Arthritis -, Fibromyalgia -  Abdominal   Peds  Hematology  (+) anemia ,   Anesthesia Other Findings   Reproductive/Obstetrics                             Anesthesia Physical Anesthesia Plan  ASA: III  Anesthesia Plan: General and Regional   Post-op Pain Management: MAC Combined w/ Regional for Post-op pain   Induction: Intravenous  Airway Management Planned: Oral ETT  Additional Equipment:   Intra-op Plan:   Post-operative Plan:   Informed Consent: I have reviewed the patients History and Physical, chart, labs and discussed the procedure including the risks, benefits and alternatives for the proposed anesthesia with the patient or authorized representative who has indicated his/her understanding and acceptance.     Plan Discussed with: CRNA, Anesthesiologist and Surgeon  Anesthesia Plan Comments:         Anesthesia Quick Evaluation

## 2015-01-26 NOTE — Discharge Instructions (Signed)
[facility] °Office Phone Number [phone] ° °BREAST BIOPSY/ PARTIAL MASTECTOMY: POST OP INSTRUCTIONS ° °Always review your discharge instruction sheet given to you by the facility where your surgery was performed. ° °IF YOU HAVE DISABILITY OR FAMILY LEAVE FORMS, YOU MUST BRING THEM TO THE OFFICE FOR PROCESSING.  DO NOT GIVE THEM TO YOUR DOCTOR. ° °1. A prescription for pain medication may be given to you upon discharge.  Take your pain medication as prescribed, if needed.  If narcotic pain medicine is not needed, then you may take acetaminophen (Tylenol) or ibuprofen (Advil) as needed. °2. Take your usually prescribed medications unless otherwise directed °3. If you need a refill on your pain medication, please contact your pharmacy.  They will contact our office to request authorization.  Prescriptions will not be filled after 5pm or on week-ends. °4. You should eat very light the first 24 hours after surgery, such as soup, crackers, pudding, etc.  Resume your normal diet the day after surgery. °5. Most patients will experience some swelling and bruising in the breast.  Ice packs and a good support bra will help.  Swelling and bruising can take several days to resolve.  °6. It is common to experience some constipation if taking pain medication after surgery.  Increasing fluid intake and taking a stool softener will usually help or prevent this problem from occurring.  A mild laxative (Milk of Magnesia or Miralax) should be taken according to package directions if there are no bowel movements after 48 hours. °7. Unless discharge instructions indicate otherwise, you may remove your bandages 24-48 hours after surgery, and you may shower at that time.  You may have steri-strips (small skin tapes) in place directly over the incision.  These strips should be left on the skin for 7-10 days.  If your surgeon used skin glue on the incision, you may shower in 24 hours.  The glue will flake off over the  next 2-3 weeks.  Any sutures or staples will be removed at the office during your follow-up visit. °8. ACTIVITIES:  You may resume regular daily activities (gradually increasing) beginning the next day.  Wearing a good support bra or sports bra minimizes pain and swelling.  You may have sexual intercourse when it is comfortable. °a. You may drive when you no longer are taking prescription pain medication, you can comfortably wear a seatbelt, and you can safely maneuver your car and apply brakes. °b. RETURN TO WORK:  ______________________________________________________________________________________ °9. You should see your doctor in the office for a follow-up appointment approximately two weeks after your surgery.  Your doctor’s nurse will typically make your follow-up appointment when she calls you with your pathology report.  Expect your pathology report 2-3 business days after your surgery.  You may call to check if you do not hear from us after three days. °10. OTHER INSTRUCTIONS: _______________________________________________________________________________________________ _____________________________________________________________________________________________________________________________________ °_____________________________________________________________________________________________________________________________________ °_____________________________________________________________________________________________________________________________________ ° °WHEN TO CALL YOUR DOCTOR: °1. Fever over 101.0 °2. Nausea and/or vomiting. °3. Extreme swelling or bruising. °4. Continued bleeding from incision. °5. Increased pain, redness, or drainage from the incision. ° °The clinic staff is available to answer your questions during regular business hours.  Please don’t hesitate to call and ask to speak to one of the nurses for clinical concerns.  If you have a medical emergency, go to the nearest  emergency room or call 911.  A surgeon from [facility] is always on call at the hospital. ° °For further questions, please visit [email or web address]  °

## 2015-01-26 NOTE — Anesthesia Procedure Notes (Addendum)
Anesthesia Regional Block:  Pectoralis block  Pre-Anesthetic Checklist: ,, timeout performed, Correct Patient, Correct Site, Correct Laterality, Correct Procedure, Correct Position, site marked, Risks and benefits discussed,  Surgical consent,  Pre-op evaluation,  At surgeon's request and post-op pain management  Laterality: Left  Prep: chloraprep       Needles:  Injection technique: Single-shot  Needle Type: Echogenic Needle     Needle Length: 9cm 9 cm Needle Gauge: 21 and 21 G    Additional Needles:  Procedures: ultrasound guided (picture in chart) Pectoralis block Narrative:  Start time: 01/26/2015 7:14 AM End time: 01/26/2015 7:28 AM Injection made incrementally with aspirations every 5 mL.  Performed by: Personally  Anesthesiologist: HODIERNE, ADAM  Additional Notes: Pt tolerated the procedure well.   Procedure Name: Intubation Performed by: Gean Maidens Pre-anesthesia Checklist: Patient identified, Emergency Drugs available, Suction available, Timeout performed and Patient being monitored Patient Re-evaluated:Patient Re-evaluated prior to inductionOxygen Delivery Method: Circle system utilized Preoxygenation: Pre-oxygenation with 100% oxygen Intubation Type: IV induction Ventilation: Mask ventilation without difficulty and Oral airway inserted - appropriate to patient size Laryngoscope Size: Mac and 4 Grade View: Grade III Tube type: Oral Tube size: 7.0 mm Number of attempts: 1 Placement Confirmation: positive ETCO2,  CO2 detector and breath sounds checked- equal and bilateral Secured at: 22 cm Tube secured with: Tape Dental Injury: Teeth and Oropharynx as per pre-operative assessment  Difficulty Due To: Difficulty was anticipated Comments: Recommend glidescope availability

## 2015-01-27 ENCOUNTER — Encounter (HOSPITAL_COMMUNITY): Payer: Self-pay | Admitting: General Surgery

## 2015-01-27 ENCOUNTER — Inpatient Hospital Stay (HOSPITAL_COMMUNITY)
Admission: EM | Admit: 2015-01-27 | Discharge: 2015-01-31 | DRG: 177 | Disposition: A | Discharge status: Home / Self Care | Payer: Medicare Other | Source: Other Acute Inpatient Hospital | Attending: Internal Medicine | Admitting: Internal Medicine

## 2015-01-27 DIAGNOSIS — Z86010 Personal history of colonic polyps: Secondary | ICD-10-CM | POA: Diagnosis not present

## 2015-01-27 DIAGNOSIS — Z7951 Long term (current) use of inhaled steroids: Secondary | ICD-10-CM | POA: Diagnosis not present

## 2015-01-27 DIAGNOSIS — E861 Hypovolemia: Secondary | ICD-10-CM | POA: Diagnosis present

## 2015-01-27 DIAGNOSIS — Z6841 Body Mass Index (BMI) 40.0 and over, adult: Secondary | ICD-10-CM | POA: Diagnosis not present

## 2015-01-27 DIAGNOSIS — G9340 Encephalopathy, unspecified: Secondary | ICD-10-CM | POA: Diagnosis present

## 2015-01-27 DIAGNOSIS — E1165 Type 2 diabetes mellitus with hyperglycemia: Secondary | ICD-10-CM | POA: Diagnosis present

## 2015-01-27 DIAGNOSIS — E119 Type 2 diabetes mellitus without complications: Secondary | ICD-10-CM | POA: Diagnosis not present

## 2015-01-27 DIAGNOSIS — N183 Chronic kidney disease, stage 3 (moderate): Secondary | ICD-10-CM | POA: Diagnosis present

## 2015-01-27 DIAGNOSIS — D638 Anemia in other chronic diseases classified elsewhere: Secondary | ICD-10-CM | POA: Diagnosis present

## 2015-01-27 DIAGNOSIS — R0602 Shortness of breath: Secondary | ICD-10-CM

## 2015-01-27 DIAGNOSIS — E875 Hyperkalemia: Secondary | ICD-10-CM | POA: Diagnosis present

## 2015-01-27 DIAGNOSIS — K219 Gastro-esophageal reflux disease without esophagitis: Secondary | ICD-10-CM | POA: Diagnosis present

## 2015-01-27 DIAGNOSIS — E662 Morbid (severe) obesity with alveolar hypoventilation: Secondary | ICD-10-CM | POA: Diagnosis present

## 2015-01-27 DIAGNOSIS — Z853 Personal history of malignant neoplasm of breast: Secondary | ICD-10-CM

## 2015-01-27 DIAGNOSIS — M1990 Unspecified osteoarthritis, unspecified site: Secondary | ICD-10-CM | POA: Diagnosis present

## 2015-01-27 DIAGNOSIS — Z91013 Allergy to seafood: Secondary | ICD-10-CM

## 2015-01-27 DIAGNOSIS — J9602 Acute respiratory failure with hypercapnia: Secondary | ICD-10-CM | POA: Diagnosis present

## 2015-01-27 DIAGNOSIS — J690 Pneumonitis due to inhalation of food and vomit: Secondary | ICD-10-CM | POA: Diagnosis present

## 2015-01-27 DIAGNOSIS — Z87891 Personal history of nicotine dependence: Secondary | ICD-10-CM | POA: Diagnosis not present

## 2015-01-27 DIAGNOSIS — C50212 Malignant neoplasm of upper-inner quadrant of left female breast: Secondary | ICD-10-CM

## 2015-01-27 DIAGNOSIS — Z7901 Long term (current) use of anticoagulants: Secondary | ICD-10-CM | POA: Diagnosis not present

## 2015-01-27 DIAGNOSIS — I4891 Unspecified atrial fibrillation: Secondary | ICD-10-CM

## 2015-01-27 DIAGNOSIS — N179 Acute kidney failure, unspecified: Secondary | ICD-10-CM | POA: Diagnosis present

## 2015-01-27 DIAGNOSIS — Z9012 Acquired absence of left breast and nipple: Secondary | ICD-10-CM | POA: Diagnosis present

## 2015-01-27 DIAGNOSIS — G4733 Obstructive sleep apnea (adult) (pediatric): Secondary | ICD-10-CM

## 2015-01-27 DIAGNOSIS — I129 Hypertensive chronic kidney disease with stage 1 through stage 4 chronic kidney disease, or unspecified chronic kidney disease: Secondary | ICD-10-CM | POA: Diagnosis present

## 2015-01-27 DIAGNOSIS — J9600 Acute respiratory failure, unspecified whether with hypoxia or hypercapnia: Secondary | ICD-10-CM | POA: Diagnosis present

## 2015-01-27 DIAGNOSIS — K7200 Acute and subacute hepatic failure without coma: Secondary | ICD-10-CM | POA: Diagnosis present

## 2015-01-27 DIAGNOSIS — J9601 Acute respiratory failure with hypoxia: Secondary | ICD-10-CM

## 2015-01-27 DIAGNOSIS — N189 Chronic kidney disease, unspecified: Secondary | ICD-10-CM | POA: Diagnosis not present

## 2015-01-27 DIAGNOSIS — Z888 Allergy status to other drugs, medicaments and biological substances status: Secondary | ICD-10-CM

## 2015-01-27 DIAGNOSIS — Z0000 Encounter for general adult medical examination without abnormal findings: Secondary | ICD-10-CM

## 2015-01-27 DIAGNOSIS — E1122 Type 2 diabetes mellitus with diabetic chronic kidney disease: Secondary | ICD-10-CM | POA: Diagnosis not present

## 2015-01-27 DIAGNOSIS — J9811 Atelectasis: Secondary | ICD-10-CM

## 2015-01-27 LAB — COMPREHENSIVE METABOLIC PANEL
ALK PHOS: 72 U/L (ref 38–126)
ALT: 1529 U/L — ABNORMAL HIGH (ref 14–54)
ANION GAP: 12 (ref 5–15)
AST: 3800 U/L — ABNORMAL HIGH (ref 15–41)
Albumin: 3.3 g/dL — ABNORMAL LOW (ref 3.5–5.0)
BUN: 35 mg/dL — ABNORMAL HIGH (ref 6–20)
CO2: 25 mmol/L (ref 22–32)
Calcium: 8.2 mg/dL — ABNORMAL LOW (ref 8.9–10.3)
Chloride: 102 mmol/L (ref 101–111)
Creatinine, Ser: 2.95 mg/dL — ABNORMAL HIGH (ref 0.44–1.00)
GFR calc non Af Amer: 16 mL/min — ABNORMAL LOW (ref >60)
GFR, EST AFRICAN AMERICAN: 18 mL/min — AB (ref >60)
GLUCOSE: 131 mg/dL — AB (ref 70–99)
Potassium: 5 mmol/L (ref 3.5–5.1)
SODIUM: 139 mmol/L (ref 135–145)
TOTAL PROTEIN: 7.1 g/dL (ref 6.5–8.1)
Total Bilirubin: 0.7 mg/dL (ref 0.3–1.2)

## 2015-01-27 LAB — POCT I-STAT 3, ART BLOOD GAS (G3+)
ACID-BASE DEFICIT: 1 mmol/L (ref 0.0–2.0)
Bicarbonate: 25.3 mEq/L — ABNORMAL HIGH (ref 20.0–24.0)
O2 SAT: 95 %
PO2 ART: 83 mmHg (ref 80.0–100.0)
Patient temperature: 97.5
TCO2: 27 mmol/L (ref 0–100)
pCO2 arterial: 50.1 mmHg — ABNORMAL HIGH (ref 35.0–45.0)
pH, Arterial: 7.308 — ABNORMAL LOW (ref 7.350–7.450)

## 2015-01-27 LAB — GLUCOSE, CAPILLARY
GLUCOSE-CAPILLARY: 122 mg/dL — AB (ref 70–99)
Glucose-Capillary: 131 mg/dL — ABNORMAL HIGH (ref 70–99)

## 2015-01-27 LAB — TROPONIN I: TROPONIN I: 0.36 ng/mL — AB (ref <0.031)

## 2015-01-27 LAB — PROCALCITONIN: PROCALCITONIN: 0.58 ng/mL

## 2015-01-27 LAB — STREP PNEUMONIAE URINARY ANTIGEN: Strep Pneumo Urinary Antigen: NEGATIVE

## 2015-01-27 LAB — PHOSPHORUS: Phosphorus: 7.4 mg/dL — ABNORMAL HIGH (ref 2.5–4.6)

## 2015-01-27 LAB — MRSA PCR SCREENING: MRSA by PCR: POSITIVE — AB

## 2015-01-27 LAB — MAGNESIUM: Magnesium: 1.9 mg/dL (ref 1.7–2.4)

## 2015-01-27 MED ORDER — FLECAINIDE ACETATE 50 MG PO TABS
50.0000 mg | ORAL_TABLET | Freq: Two times a day (BID) | ORAL | Status: DC
  Administered 2015-01-27 – 2015-01-31 (×8): 50 mg via ORAL
  Filled 2015-01-27 (×11): qty 1

## 2015-01-27 MED ORDER — SODIUM CHLORIDE 0.9 % IV SOLN
3.0000 g | Freq: Four times a day (QID) | INTRAVENOUS | Status: DC
  Administered 2015-01-27 – 2015-01-29 (×7): 3 g via INTRAVENOUS
  Filled 2015-01-27 (×9): qty 3

## 2015-01-27 MED ORDER — MUPIROCIN 2 % EX OINT
1.0000 "application " | TOPICAL_OINTMENT | Freq: Two times a day (BID) | CUTANEOUS | Status: DC
  Administered 2015-01-27 – 2015-01-31 (×8): 1 via NASAL
  Filled 2015-01-27 (×2): qty 22

## 2015-01-27 MED ORDER — BUDESONIDE 0.5 MG/2ML IN SUSP
0.5000 mg | Freq: Two times a day (BID) | RESPIRATORY_TRACT | Status: DC
  Administered 2015-01-27 – 2015-01-28 (×2): 0.5 mg via RESPIRATORY_TRACT
  Filled 2015-01-27 (×4): qty 2

## 2015-01-27 MED ORDER — ALBUTEROL SULFATE HFA 108 (90 BASE) MCG/ACT IN AERS: 2.0000 | INHALATION_SPRAY | RESPIRATORY_TRACT | Status: DC | PRN

## 2015-01-27 MED ORDER — RIVAROXABAN 15 MG PO TABS
15.0000 mg | ORAL_TABLET | Freq: Every day | ORAL | Status: DC
  Filled 2015-01-27: qty 1

## 2015-01-27 MED ORDER — CHLORHEXIDINE GLUCONATE CLOTH 2 % EX PADS
6.0000 | MEDICATED_PAD | Freq: Every day | CUTANEOUS | Status: DC
  Administered 2015-01-28 – 2015-01-30 (×3): 6 via TOPICAL

## 2015-01-27 MED ORDER — DEXTROSE-NACL 5-0.45 % IV SOLN
INTRAVENOUS | Status: DC
Start: 2015-01-27 — End: 2015-01-28
  Administered 2015-01-27 – 2015-01-28 (×2): via INTRAVENOUS

## 2015-01-27 MED ORDER — PANTOPRAZOLE SODIUM 40 MG PO TBEC
40.0000 mg | DELAYED_RELEASE_TABLET | Freq: Every day | ORAL | Status: DC
  Administered 2015-01-28 – 2015-01-31 (×4): 40 mg via ORAL
  Filled 2015-01-27 (×6): qty 1

## 2015-01-27 MED ORDER — ARFORMOTEROL TARTRATE 15 MCG/2ML IN NEBU
15.0000 ug | INHALATION_SOLUTION | Freq: Two times a day (BID) | RESPIRATORY_TRACT | Status: DC
  Administered 2015-01-27 – 2015-01-28 (×2): 15 ug via RESPIRATORY_TRACT
  Filled 2015-01-27 (×4): qty 2

## 2015-01-27 MED ORDER — CETYLPYRIDINIUM CHLORIDE 0.05 % MT LIQD
7.0000 mL | Freq: Two times a day (BID) | OROMUCOSAL | Status: DC
  Administered 2015-01-28 – 2015-01-29 (×3): 7 mL via OROMUCOSAL

## 2015-01-27 MED ORDER — PAROXETINE HCL 20 MG PO TABS
20.0000 mg | ORAL_TABLET | Freq: Every day | ORAL | Status: DC
  Administered 2015-01-28 – 2015-01-31 (×4): 20 mg via ORAL
  Filled 2015-01-27 (×4): qty 1

## 2015-01-27 MED ORDER — CHLORHEXIDINE GLUCONATE 0.12 % MT SOLN
15.0000 mL | Freq: Two times a day (BID) | OROMUCOSAL | Status: DC
  Administered 2015-01-27 – 2015-01-28 (×2): 15 mL via OROMUCOSAL
  Filled 2015-01-27 (×4): qty 15

## 2015-01-27 MED ORDER — RIVAROXABAN 20 MG PO TABS: 20.0000 mg | ORAL_TABLET | Freq: Every day | ORAL | Status: DC

## 2015-01-27 MED ORDER — ALBUTEROL SULFATE (2.5 MG/3ML) 0.083% IN NEBU: 2.5000 mg | INHALATION_SOLUTION | RESPIRATORY_TRACT | Status: DC | PRN

## 2015-01-27 NOTE — Anesthesia Postprocedure Evaluation (Signed)
Anesthesia Post Note  Patient: [name]  Procedure(s) Performed: Procedure(s) (LRB): LEFT  PARTIAL MASTECTOMY WITH  RADIOACTIVE SEED LOCALIZATION, LEFT AXILLARY SENTINEL LYMPH NODE BIOPSY (Left)  Anesthesia type: General  Patient location: PACU  Post pain: Pain level controlled and Adequate analgesia  Post assessment: Post-op Vital signs reviewed, Patient's Cardiovascular Status Stable, Respiratory Function Stable, Patent Airway and Pain level controlled  Last Vitals:  Filed Vitals:   01/26/15 1009  BP: 133/61  Pulse: 77  Temp: 36.5 C  Resp: 16    Post vital signs: Reviewed and stable  Level of consciousness: awake, alert  and oriented  Complications: No apparent anesthesia complications

## 2015-01-27 NOTE — H&P (Signed)
Name: [name] MRN: [id number] DOB: [birth year]    ADMISSION DATE:  01/27/2015 CONSULTATION DATE:  5/4  REFERRING MD :  Jake Samples  CHIEF COMPLAINT:  Acute respiratory failure   BRIEF PATIENT DESCRIPTION:  This is a 64 year old female w/ h/o COPD, OSA w/ BMI 41, AF on xarelto, GERD, Fibromyalgia and newly diagnosed invasive mammary carcinoma of the left breast. She is s/p left partial mastectomy 5/3. Went home went to sleep. Did not wake up the following morning. Late in am family was unable to arouse her. Transferred from [facility] ER w/ acute hypercarbic respiratory failure, volume responsive hypotension, AKI and hyperkalemia. Of note did not wear her CPAP the night prior.   SIGNIFICANT EVENTS    STUDIES:  HISTORY OF PRESENT ILLNESS:   This is a 64 year old female w/ h/o COPD, OSA w/ BMI 41, AF on xarelto, GERD, Fibromyalgia and newly diagnosed invasive mammary carcinoma of the left breast. She is s/p left partial mastectomy 5/3. Went home. Went to sleep in the afternoon 5/3. Late morning on 5/4 family noted she had not been out of bed. They found her in bed and were not able to wake her. EMS was called. On EMS arrival sats were on 80s. Got Narcan. Placed on BIPAP. Given 2 liters of NS for initial BP in 70s. Further dx eval showed AKI w/ scr 2.6, and respiratory acidosis.  Of note pt did not use her CPAP the night before. By time transport arrived to bring her to [place] she was awake and tolerating [place].   PAST MEDICAL HISTORY :   has a past medical history of Asthma; COPD (chronic obstructive pulmonary disease); GERD (gastroesophageal reflux disease); Osteoarthritis; DDD (degenerative disc disease); Pelvic fracture; Hiatal hernia; Migraine; Anxiety disorder; Anemia; Diverticulosis; Adenomatous colon polyp (1993); Atrial flutter (chadsvasc of 3 (11/19/14)); Essential hypertension, benign; Pneumonia; Fibromyalgia; Lipoma of colon; Internal and external hemorrhoids without complication;  Breast cancer; PONV (postoperative nausea and vomiting); Sleep apnea; Anxiety; and History of atrial fibrillation.  has past surgical history that includes Total knee arthroplasty; Rotator cuff repair; A flutter ablation (02/22/2012); Dilation and curettage of uterus; Colonoscopy (05/2010, 07/2000); Esophagogastroduodenoscopy (05/2010, 06/1993); Cardioversion (N/A, 02/19/2014); Atrial flutter ablation (N/A, 02/22/2012); Breast biopsy (Left, 11/2014); Joint replacement; Rotator cuff repair; and Radioactive seed guided mastectomy with axillary sentinel lymph node biopsy (Left, 01/26/2015). Prior to Admission medications   Medication Sig Start Date End Date Taking? Authorizing Provider  acetaminophen (TYLENOL) 500 MG tablet Take 1,000-1,500 mg by mouth 2 (two) times daily as needed for headache (migraines).    Historical Provider, MD  albuterol (PROVENTIL HFA;VENTOLIN HFA) 108 (90 BASE) MCG/ACT inhaler Inhale 2 puffs into the lungs every 4 (four) hours as needed for wheezing or shortness of breath. 10/02/14   Tammy S Parrett, NP  ALPRAZolam (XANAX) 0.5 MG tablet Take 0.5 mg by mouth 3 (three) times daily as needed for anxiety.  12/22/14   Historical Provider, MD  bisoprolol (ZEBETA) 10 MG tablet Take 1 tablet (10 mg total) by mouth daily. 10/30/12   Tammy S Parrett, NP  celecoxib (CELEBREX) 200 MG capsule Take 1 capsule every 3 days    Historical Provider, MD  cephALEXin (KEFLEX) 500 MG capsule Take 1 capsule (500 mg total) by mouth 3 (three) times daily. 01/22/15   Patrici Ranks, MD  diltiazem (CARDIZEM CD) 180 MG 24 hr capsule Take 180 mg by mouth daily.    Historical Provider, MD  flecainide (TAMBOCOR) 50 MG tablet Take  1 tablet (50 mg total) by mouth 2 (two) times daily. 04/06/14   Thompson Grayer, MD  fluticasone (FLONASE) 50 MCG/ACT nasal spray Place 1 spray into the nose as needed for allergies.  12/06/14   Historical Provider, MD  furosemide (LASIX) 20 MG tablet Take 20 mg by mouth daily as needed for edema.      Historical Provider, MD  HYDROcodone-acetaminophen (NORCO) 5-325 MG per tablet Take 1-2 tablets by mouth every 6 (six) hours as needed for moderate pain or severe pain. 01/26/15   Fanny Skates, MD  hydrOXYzine (ATARAX/VISTARIL) 25 MG tablet Take 25 mg by mouth as needed for itching.  10/13/14   Historical Provider, MD  losartan (COZAAR) 100 MG tablet Take 1 tablet (100 mg total) by mouth daily. 10/30/12   Melvenia Needles, NP  omeprazole (PRILOSEC) 40 MG capsule take 1 capsule by mouth once daily before BREAKFAST 10/09/14   Gatha Mayer, MD  PARoxetine (PAXIL) 20 MG tablet Take 20 mg by mouth daily.    Historical Provider, MD  promethazine (PHENERGAN) 25 MG tablet Take 25 mg by mouth every 6 (six) hours as needed for nausea.  10/13/14   Historical Provider, MD  rivaroxaban (XARELTO) 20 MG TABS tablet Take 1 tablet (20 mg total) by mouth daily with supper. 04/13/14   Thompson Grayer, MD  SSD 1 % cream Apply 1 application topically as needed (wound).  12/04/14   Historical Provider, MD  SYMBICORT 80-4.5 MCG/ACT inhaler inhale 2 puffs by mouth twice a day Rinse mouth after use 11/10/14   Tanda Rockers, MD   Allergies  Allergen Reactions  . Shellfish Allergy Hives and Shortness Of Breath  . Ace Inhibitors Cough  . Epinephrine Other (See Comments)    Pallpitations during dental procedures  . Theophyllines Nausea And Vomiting and Other (See Comments)    Heart races and pounds  . Propoxyphene Hcl Nausea And Vomiting    FAMILY HISTORY:  family history includes Breast cancer in her mother and sister; Emphysema in her father; Heart disease in her father and sister. There is no history of Colon cancer. SOCIAL HISTORY:  reports that she quit smoking about 30 years ago. Her smoking use included Cigarettes. She has a 20 pack-year smoking history. She has never used smokeless tobacco. She reports that she drinks alcohol. She reports that she does not use illicit drugs.  REVIEW OF SYSTEMS:   Unable    SUBJECTIVE:  Lethargic   VITAL SIGNS: Temp:  [98.4 F (36.9 C)] 98.4 F (36.9 C) (05/04 1515) Pulse Rate:  [71] 71 (05/04 1515) Resp:  [22] 22 (05/04 1515) BP: (142)/(66) 142/66 mmHg (05/04 1515) SpO2:  [97 %] 97 % (05/04 1515) Weight:  [127 kg (279 lb 15.8 oz)] 127 kg (279 lb 15.8 oz) (05/04 1515)  PHYSICAL EXAMINATION: General:  Obese white female, arousable but drifts back to sleep.  Neuro:  Wakes to voice, oriented X 3, moves all ext  HEENT:  MM dry, neck large, no clear JVD  Cardiovascular:  RRR, I/VI SEM  Lungs:  Scattered rhonchi  Abdomen:  Soft, non-tender + bowel sounds  Musculoskeletal:  Intact  Skin:  Intact. Left Chest/breast incision CD&I,    Recent Labs Lab 01/25/15 1130  NA 139  K 3.5  CL 101  CO2 25  BUN 15  CREATININE 1.24*  GLUCOSE 154*    Recent Labs Lab 01/25/15 1130  HGB 9.2*  HCT 30.2*  WBC 9.0  PLT 403*    Recent  Labs Lab 01/25/15 1130  AST 23  ALT 19  ALKPHOS 73  BILITOT 0.5  PROT 8.2*  ALBUMIN 3.8  INR 1.18      Nm Sentinel Node Inj-no Rpt (breast)  01/26/2015   CLINICAL DATA: cancer left brast   Sulfur colloid was injected intradermally by the nuclear medicine  technologist for breast cancer sentinel node localization.     ASSESSMENT / PLAN: PULMONARY A: Acute hypoxic and Hypercarbic Respiratory failure. Suspect that this was multifactorial: residual anesthesia effect w/ hypoventilation, super-imposed on underlying OSA, obesity and air flow limitation from her COPD. Possible L>R airspace disease which could reflect either aspiration or atelectasis  P: Admit to ICU Npo for now BIPAP  See ID section  Hold all sedation   Cardiovascular A: Hypotension (resolved w/ IVFs) CAF (Chads2 score: 3) P: Hold antihypertensives until stabilized Cont IVFs Resume xarelto  Will cont flecainide, but hold cardiazem, lasix, zebeta, and cozaar for now.   Neuro: A: Acute Encephalopathy: likely multifactorial: hypercarbia and  AKI P: Hold all narcotics Supportive care  RENAL A: AKI superimposed on CKD stage 3 P: Hold antihypertensives Cont IVFs Repeat chem in am  GI:  A:  GERD abnml LFTs-->likely shock liver  P: NPO  PPI for SUP Repeat LFTs am  Heme A: Anemia of chronic disease Newly dx breast cancer. S/p partial mastectomy  P: Trend cbc Resume xarelto in am   Endocrine A: Mild hyperglycemia  P: ssi   ID: A: Possible aspiration P: Ck PCT Trend fever and WBC curve Start unasyn 5/4>>>  She is full Code. She would not however want prolonged care if a catastrophic event were to occur and chances of full recovery poor.    Erick Colace ACNP-BC [place] Pager # [phone] OR # [phone] if no answer 01/27/2015, 3:47 PM  Attending Note:  I have examined patient, reviewed labs, studies and notes. I have discussed the case with Jerrye Bushy, and I agree with the data and plans as amended above. Pt has hx OSA/OHS and is s/p partial mastectomy 5/3 for newly dx breast CA. She slept 5/3 without CPAP and family was unable to rouse her this am.  Eval at [facility] showed hypotension, hypercapneic respiratory failure, acute renal and hepatic injuries. Suspect that she experienced hypoxemia / hypoperfusion post-op. On my eval here she is lethargic, difficulty to keep awake, particularly when she comes off BiPAP. We will restart BiPAP, support as able with IVF, follow renal and hepatic labs for improvement. Her L > R LL opacities are probably atelectasis but we will treat for now with empiric abx for PNA. Low threshold to stop these on 5/5. Independent critical care time is 50 minutes.   Baltazar Apo, MD, PhD 01/27/2015, 4:32 PM [facility] [phone] or if no answer [phone]

## 2015-01-27 NOTE — Progress Notes (Signed)
Pt arrived to 2M09 from [place] by [facility]. Pt on 3L at this time with bipap on SB. Pt was on bipap in the ED at [facility]. RT will continue to monitor.

## 2015-01-27 NOTE — Progress Notes (Signed)
ANTIBIOTIC CONSULT NOTE - INITIAL  Pharmacy Consult for Unasyn Indication: pneumonia  Allergies  Allergen Reactions  . Shellfish Allergy Hives and Shortness Of Breath  . Ace Inhibitors Cough  . Epinephrine Other (See Comments)    Pallpitations during dental procedures  . Theophyllines Nausea And Vomiting and Other (See Comments)    Heart races and pounds  . Propoxyphene Hcl Nausea And Vomiting    Patient Measurements: Height: 5\' 5"  (165.1 cm) Weight: 279 lb 15.8 oz (127 kg) IBW/kg (Calculated) : 57  Vital Signs: Temp: 97.5 F (36.4 C) (05/04 1549) Temp Source: Oral (05/04 1549) BP: 128/81 mmHg (05/04 1545) Pulse Rate: 72 (05/04 1545) Intake/Output from previous day:   Intake/Output from this shift:    Labs:  Recent Labs  01/25/15 1130  WBC 9.0  HGB 9.2*  PLT 403*  CREATININE 1.24*   Estimated Creatinine Clearance: 59.9 mL/min (by C-G formula based on Cr of 1.24). No results for input(s): VANCOTROUGH, VANCOPEAK, VANCORANDOM, GENTTROUGH, GENTPEAK, GENTRANDOM, TOBRATROUGH, TOBRAPEAK, TOBRARND, AMIKACINPEAK, AMIKACINTROU, AMIKACIN in the last 72 hours.   Microbiology: No results found for this or any previous visit (from the past 720 hour(s)).  Medical History: Past Medical History  Diagnosis Date  . Asthma     Spirometry 1989: FEV1 2.12 (71%) with ratio 82.  HFA 90% after coaching 11-09-10  . COPD (chronic obstructive pulmonary disease)   . GERD (gastroesophageal reflux disease)     EGD with HH / esophagitis 06-30-93  . Osteoarthritis   . DDD (degenerative disc disease)   . Pelvic fracture   . Hiatal hernia   . Migraine   . Anxiety disorder   . Anemia   . Diverticulosis   . Adenomatous colon polyp 1993  . Atrial flutter chadsvasc of 3 (11/19/14)    Status post cardioversion 2-12 2013. s/p RFCA 5/13 with JAllred (coumadin and amio stopped)   . Essential hypertension, benign   . Pneumonia   . Fibromyalgia   . Lipoma of colon   . Internal and external  hemorrhoids without complication   . Breast cancer     left  . PONV (postoperative nausea and vomiting)   . Sleep apnea     cpap  . Anxiety   . History of atrial fibrillation     Assessment: 64 year old female to begin Unasyn for aspiration pneumonia  Goal of Therapy:  Appropriate dosing  Plan:  Unasyn 3 grams iv Q 6 hours Pharmacy will sign off and follow peripherally  Thank you. Anette Guarneri, PharmD [phone]  01/27/2015,4:14 PM

## 2015-01-27 NOTE — Progress Notes (Signed)
Quick Note:  Inform patient of Pathology report,. Tell her that her cancer was 1.1 cm diameter. This was completely removed and the margins are negative. All 3 lymph nodes are negative. She will not need any further surgery. This is good news. Make sure she has follow-up appointments with medical oncology and radiation oncology.  hmi ______

## 2015-01-28 ENCOUNTER — Telehealth (HOSPITAL_COMMUNITY): Payer: Self-pay | Admitting: *Deleted

## 2015-01-28 ENCOUNTER — Encounter (HOSPITAL_COMMUNITY): Payer: Self-pay | Admitting: Pulmonary Disease

## 2015-01-28 DIAGNOSIS — J9602 Acute respiratory failure with hypercapnia: Secondary | ICD-10-CM

## 2015-01-28 LAB — CBC
HCT: 29.2 % — ABNORMAL LOW (ref 36.0–46.0)
Hemoglobin: 8.8 g/dL — ABNORMAL LOW (ref 12.0–15.0)
MCH: 25.2 pg — AB (ref 26.0–34.0)
MCHC: 30.1 g/dL (ref 30.0–36.0)
MCV: 83.7 fL (ref 78.0–100.0)
PLATELETS: 296 10*3/uL (ref 150–400)
RBC: 3.49 MIL/uL — AB (ref 3.87–5.11)
RDW: 16.8 % — ABNORMAL HIGH (ref 11.5–15.5)
WBC: 10.5 10*3/uL (ref 4.0–10.5)

## 2015-01-28 LAB — BASIC METABOLIC PANEL
Anion gap: 10 (ref 5–15)
BUN: 37 mg/dL — AB (ref 6–20)
CO2: 25 mmol/L (ref 22–32)
CREATININE: 2.53 mg/dL — AB (ref 0.44–1.00)
Calcium: 8.1 mg/dL — ABNORMAL LOW (ref 8.9–10.3)
Chloride: 104 mmol/L (ref 101–111)
GFR calc Af Amer: 22 mL/min — ABNORMAL LOW (ref >60)
GFR, EST NON AFRICAN AMERICAN: 19 mL/min — AB (ref >60)
GLUCOSE: 168 mg/dL — AB (ref 70–99)
POTASSIUM: 4.4 mmol/L (ref 3.5–5.1)
Sodium: 139 mmol/L (ref 135–145)

## 2015-01-28 LAB — TROPONIN I
Troponin I: 0.35 ng/mL — ABNORMAL HIGH (ref <0.031)
Troponin I: 0.26 ng/mL — ABNORMAL HIGH (ref <0.031)

## 2015-01-28 LAB — HEPATIC FUNCTION PANEL
ALK PHOS: 65 U/L (ref 38–126)
ALT: 862 U/L — ABNORMAL HIGH (ref 14–54)
AST: 1196 U/L — ABNORMAL HIGH (ref 15–41)
Albumin: 3 g/dL — ABNORMAL LOW (ref 3.5–5.0)
BILIRUBIN INDIRECT: 0.3 mg/dL (ref 0.3–0.9)
BILIRUBIN TOTAL: 0.5 mg/dL (ref 0.3–1.2)
Bilirubin, Direct: 0.2 mg/dL (ref 0.1–0.5)
TOTAL PROTEIN: 6.7 g/dL (ref 6.5–8.1)

## 2015-01-28 LAB — PHOSPHORUS: Phosphorus: 5.4 mg/dL — ABNORMAL HIGH (ref 2.5–4.6)

## 2015-01-28 LAB — MAGNESIUM: Magnesium: 2 mg/dL (ref 1.7–2.4)

## 2015-01-28 MED ORDER — DILTIAZEM HCL ER COATED BEADS 180 MG PO CP24
180.0000 mg | ORAL_CAPSULE | Freq: Every day | ORAL | Status: DC
  Administered 2015-01-28 – 2015-01-31 (×4): 180 mg via ORAL
  Filled 2015-01-28 (×4): qty 1

## 2015-01-28 MED ORDER — SODIUM CHLORIDE 0.9 % IV SOLN
INTRAVENOUS | Status: DC
  Administered 2015-01-29: 05:00:00 via INTRAVENOUS

## 2015-01-28 MED ORDER — IPRATROPIUM-ALBUTEROL 0.5-2.5 (3) MG/3ML IN SOLN: 3.0000 mL | Freq: Four times a day (QID) | RESPIRATORY_TRACT | Status: DC

## 2015-01-28 MED ORDER — BUDESONIDE-FORMOTEROL FUMARATE 80-4.5 MCG/ACT IN AERO
2.0000 | INHALATION_SPRAY | Freq: Two times a day (BID) | RESPIRATORY_TRACT | Status: DC
  Administered 2015-01-28 – 2015-01-31 (×6): 2 via RESPIRATORY_TRACT
  Filled 2015-01-28 (×2): qty 6.9

## 2015-01-28 MED ORDER — BISOPROLOL FUMARATE 10 MG PO TABS
10.0000 mg | ORAL_TABLET | Freq: Every day | ORAL | Status: DC
  Administered 2015-01-28 – 2015-01-31 (×4): 10 mg via ORAL
  Filled 2015-01-28 (×4): qty 1

## 2015-01-28 NOTE — Progress Notes (Signed)
Name: [name] MRN: [id number] DOB: March 06, 1951    ADMISSION DATE:  01/27/2015 CONSULTATION DATE:  5/4  REFERRING MD :  Jake Samples  CHIEF COMPLAINT:  Acute respiratory failure   BRIEF PATIENT DESCRIPTION:  64 yo female with Lt breast cancer s/p Lt partial mastectomy 5/03.  She was unarousable at home after discharge.  Went to [place] >> hypercapnic respiratory failure, hypotensive, AKI, hyperkalemia.  She has hx of asthma, OSA, Morbid obesity, AF on xarelto.  She has been followed in pulmonary office by Dr. Melvyn Novas.  SIGNIFICANT EVENTS  5/03 Lt partial mastectomy  STUDIES:  SUBJECTIVE:  C/o cough and sore throat.  VITAL SIGNS: Temp:  [97.5 F (36.4 C)-99.1 F (37.3 C)] 98.6 F (37 C) (05/05 0803) Pulse Rate:  [70-85] 82 (05/05 0900) Resp:  [13-25] 21 (05/05 0900) BP: (106-167)/(59-139) 148/78 mmHg (05/05 0900) SpO2:  [92 %-100 %] 95 % (05/05 0900) FiO2 (%):  [40 %] 40 % (05/05 0825) Weight:  [278 lb (126.1 kg)-279 lb 15.8 oz (127 kg)] 278 lb (126.1 kg) (05/05 0500)  PHYSICAL EXAMINATION: General:  No distress Neuro:  Normal strength HEENT:  BiPAP mask on Cardiovascular: regular Lungs: no wheeze Abdomen:  Soft, non-tender Musculoskeletal:  No edema Skin: no rashes  CBC Recent Labs     01/25/15  1130  01/28/15  0450  WBC  9.0  10.5  HGB  9.2*  8.8*  HCT  30.2*  29.2*  PLT  403*  296    Coag's Recent Labs     01/25/15  1130  APTT  32  INR  1.18    BMET Recent Labs     01/25/15  1130  01/27/15  1705  01/28/15  0450  NA  139  139  139  K  3.5  5.0  4.4  CL  101  102  104  CO2  25  25  25   BUN  15  35*  37*  CREATININE  1.24*  2.95*  2.53*  GLUCOSE  154*  131*  168*    Electrolytes Recent Labs     01/25/15  1130  01/27/15  1705  01/28/15  0450  CALCIUM  9.5  8.2*  8.1*  MG   --   1.9  2.0  PHOS   --   7.4*  5.4*    Sepsis Markers Recent Labs     01/27/15  1705  PROCALCITON  0.58    ABG Recent Labs     01/27/15  1723    PHART  7.308*  PCO2ART  50.1*  PO2ART  83.0    Liver Enzymes Recent Labs     01/25/15  1130  01/27/15  1705  AST  23  3800*  ALT  19  1529*  ALKPHOS  73  72  BILITOT  0.5  0.7  ALBUMIN  3.8  3.3*    Cardiac Enzymes Recent Labs     01/27/15  1705  01/27/15  2240  01/28/15  0450  TROPONINI  0.36*  0.35*  0.26*    Glucose Recent Labs     01/27/15  1523  01/27/15  1549  GLUCAP  122*  131*    Imaging No results found.    ASSESSMENT / PLAN: PULMONARY A: Acute hypoxic/hypercapnic respiratory failure in setting of OSA and possible OHS. Hx of Asthma. Possible aspiration pneumonitis. P: Oxygen to keep SpO2 88 to 95% BiPAP qhs and prn Symbicort Scheduled BDs  F/u CXR  Cardiovascular A:  Hypotension 2nd to hypovolemia >> resolved. CAF (Chads2 score: 3). P: Decrease IV fluids to 75 ml/hr Resume cardizem, zebeta Continue flecainide Hold Lasix, Cozaar in setting of AKI Hold xarelto in setting of elevated LFTs  Neuro: A: Acute Encephalopathy: likely multifactorial: hypercarbia and AKI >> improved. P: Hold all narcotics Supportive care  RENAL A: AKI 2nd to hypovolemia >> baseline creatinine 1.24 from 01/25/15. Hx of CKD stage 3. P: Monitor renal fx, urine outpt  GI:  A:  GERD. abnml LFTs-->likely shock liver.  P: Advance diet F/u LFT's  Heme A: Anemia of chronic disease. Newly dx breast cancer. S/p partial mastectomy. P: F/u CBC SCDs  Endocrine A: Mild hyperglycemia  P: Monitor blood sugar on BMET  ID: A:  Aspiration pneumonitis. P: Day 2 unasyn  CC time 40 minutes.  Chesley Mires, MD [facility] Pulmonary/Critical Care 01/28/2015, 10:09 AM Pager:  [phone] After 3pm call: [phone]

## 2015-01-28 NOTE — Telephone Encounter (Signed)
Call made to [facility] in [place] to be on the look out for the oncotype request coming through on this pt. She said ok.

## 2015-01-29 ENCOUNTER — Inpatient Hospital Stay (HOSPITAL_COMMUNITY): Payer: Medicare Other

## 2015-01-29 LAB — COMPREHENSIVE METABOLIC PANEL
ALK PHOS: 65 U/L (ref 38–126)
ALT: 587 U/L — AB (ref 14–54)
ANION GAP: 9 (ref 5–15)
AST: 492 U/L — ABNORMAL HIGH (ref 15–41)
Albumin: 3 g/dL — ABNORMAL LOW (ref 3.5–5.0)
BUN: 26 mg/dL — AB (ref 6–20)
CALCIUM: 8.5 mg/dL — AB (ref 8.9–10.3)
CO2: 26 mmol/L (ref 22–32)
Chloride: 105 mmol/L (ref 101–111)
Creatinine, Ser: 1.48 mg/dL — ABNORMAL HIGH (ref 0.44–1.00)
GFR calc Af Amer: 41 mL/min — ABNORMAL LOW (ref >60)
GFR, EST NON AFRICAN AMERICAN: 36 mL/min — AB (ref >60)
Glucose, Bld: 123 mg/dL — ABNORMAL HIGH (ref 70–99)
POTASSIUM: 4 mmol/L (ref 3.5–5.1)
SODIUM: 140 mmol/L (ref 135–145)
TOTAL PROTEIN: 6.5 g/dL (ref 6.5–8.1)
Total Bilirubin: 0.6 mg/dL (ref 0.3–1.2)

## 2015-01-29 LAB — CBC
HCT: 26.1 % — ABNORMAL LOW (ref 36.0–46.0)
Hemoglobin: 8.1 g/dL — ABNORMAL LOW (ref 12.0–15.0)
MCH: 26.4 pg (ref 26.0–34.0)
MCHC: 31 g/dL (ref 30.0–36.0)
MCV: 85 fL (ref 78.0–100.0)
PLATELETS: 222 10*3/uL (ref 150–400)
RBC: 3.07 MIL/uL — AB (ref 3.87–5.11)
RDW: 16.8 % — AB (ref 11.5–15.5)
WBC: 6.9 10*3/uL (ref 4.0–10.5)

## 2015-01-29 MED ORDER — SODIUM CHLORIDE 0.9 % IV SOLN: INTRAVENOUS | Status: DC | PRN

## 2015-01-29 MED ORDER — AMOXICILLIN-POT CLAVULANATE 500-125 MG PO TABS
1.0000 | ORAL_TABLET | Freq: Three times a day (TID) | ORAL | Status: DC
  Administered 2015-01-29 – 2015-01-31 (×7): 500 mg via ORAL
  Filled 2015-01-29 (×10): qty 1

## 2015-01-29 NOTE — Progress Notes (Addendum)
Pt attempting to stand when OT arrived and was assisted to the bathroom, where she missed the toilet by sitting prematurely. Pt was gently lowered to the floor. RN was notified and assisted OT, along with NT and other nursing staff, in getting pt safely off floor.     Cyndie Chime, OTR/L Occupational Therapist [phone] (pager)

## 2015-01-29 NOTE — Evaluation (Signed)
Occupational Therapy Evaluation Patient Details Name: [name] MRN: [id number] DOB: Nov 25, 1950 Today's Date: 01/29/2015    History of Present Illness Pt is a 64 y.o. Female with PMH COPD, obesity, AF on xarelto, fibromyalgia, mammary carcinoma of left breast who is s/p left partial mastectomy on 5/4. Pt d/c home after surgery and was unarousable the following morning. Family took pt to ER and Pt was transferred from [facility] ER with acute hypercarbic respiratory failure, volume responsive hypotension, AKI, and hyperkalemia.    Clinical Impression   PTA pt lived at home and reports independence with ADLs. Pt was attempting to stand from recliner when OT arrived and stated she needed to use the bathroom. Assisted pt to bathroom, where she sat prematurely and was lowered to the floor (further details in ADL comments below). Pt is eager to return home, however feel that due to pt's weakness and deconditioning she would benefit from [place] [facility] SNF prior to return home. She will need 24/7 physical assistance. Pt will benefit from acute OT to address functional mobility and ADLs.     Follow Up Recommendations  SNF;Supervision/Assistance - 24 hour    Equipment Recommendations  3 in 1 bedside comode    Recommendations for Other Services       Precautions / Restrictions Precautions Precautions: Fall Restrictions Weight Bearing Restrictions: No      Mobility Bed Mobility               General bed mobility comments: Pt sitting in recliner when OT arrived.   Transfers Overall transfer level: Needs assistance Equipment used: Rolling walker (2 wheeled) Transfers: Sit to/from Stand Sit to Stand: Min assist         General transfer comment: Min A to stand from recliner. VC's for hand placement.     Balance                                            ADL Overall ADL's : Needs assistance/impaired Eating/Feeding: Independent;Sitting   Grooming:  Set up;Sitting   Upper Body Bathing: Set up;Sitting   Lower Body Bathing: Moderate assistance;Sit to/from stand   Upper Body Dressing : Set up;Sitting   Lower Body Dressing: Maximal assistance;Sit to/from stand   Toilet Transfer: Minimal assistance;Ambulation;RW Toilet Transfer Details (indicate cue type and reason): Min A to complete stand. min guard to ambulate         Functional mobility during ADLs: Min guard;Rolling walker General ADL Comments: Pt was attempting to stand when OT arrived and indicated need to use bathroom. Pt was half-standing and OT assisted in standing safely with RW. Pt ambulated to bathroom with min guard assist, however pt sat prematurely before being squared in front of the toilet. Pt ended up half on the toilet and on OT's lap in an attempt to break her descent. Pt was lethargic and was minimally assisting in attempting to stand and OT required to lower pt gently to the bathroom floor. Notified RN who arrived with NT and assisted in getting pt off floor.      Vision Additional Comments: No change from baseline          Pertinent Vitals/Pain Pain Assessment: No/denies pain     Hand Dominance     Extremity/Trunk Assessment Upper Extremity Assessment Upper Extremity Assessment: Generalized weakness   Lower Extremity Assessment Lower Extremity Assessment: Defer to PT  evaluation   Cervical / Trunk Assessment Cervical / Trunk Assessment: Normal   Communication Communication Communication: No difficulties   Cognition Arousal/Alertness: Lethargic Behavior During Therapy: Impulsive Overall Cognitive Status: Difficult to assess                                Home Living Family/patient expects to be discharged to:: Private residence Living Arrangements: Spouse/significant other (sister?) Available Help at Discharge: Family;Available 24 hours/day                             Additional Comments: unable to ask home living  questions      Prior Functioning/Environment Level of Independence: Independent        Comments: reports independence prior to     OT Diagnosis: Generalized weakness   OT Problem List: Decreased strength;Decreased range of motion;Decreased activity tolerance;Impaired balance (sitting and/or standing);Decreased safety awareness   OT Treatment/Interventions: Self-care/ADL training;Therapeutic exercise;Energy conservation;DME and/or AE instruction;Therapeutic activities;Balance training;Patient/family education    OT Goals(Current goals can be found in the care plan section) Acute Rehab OT Goals Patient Stated Goal: to go home ASAP OT Goal Formulation: With patient Time For Goal Achievement: 02/12/15 Potential to Achieve Goals: Good ADL Goals Pt Will Perform Grooming: with supervision;standing Pt Will Perform Lower Body Bathing: with supervision;sit to/from stand Pt Will Perform Lower Body Dressing: with supervision;sit to/from stand Pt Will Transfer to Toilet: with supervision;ambulating Pt Will Perform Toileting - Clothing Manipulation and hygiene: with supervision;sit to/from stand  OT Frequency: Min 2X/week    End of Session Equipment Utilized During Treatment: Gait belt;Rolling walker Nurse Communication: Other (comment) (pt lowered to the floor)  Activity Tolerance: Patient tolerated treatment well Patient left: Other (comment);with nursing/sitter in room (on [facility])   Time: 4628-6381 OT Time Calculation (min): 28 min Charges:  OT General Charges $OT Visit: 1 Procedure OT Evaluation $Initial OT Evaluation Tier I: 1 Procedure OT Treatments $Self Care/Home Management : 8-22 mins G-Codes:    Villa Herb M 2015-01-31, 6:04 PM  Cyndie Chime, OTR/L Occupational Therapist [phone] (pager)

## 2015-01-29 NOTE — Progress Notes (Signed)
Patient brought home CPAP with her own tubing and nasal pillows. CPAP settings are 8 cmH2O. Water chamber from home was dirty and filled with brown water. Water poured out and chamber was cleaned. Some brown residue remains on chamber after cleaning. Filled with sterile water. Plug in and wires were in good condition, no frays. Patient stated that she places herself on CPAP at night. RT made patient aware that if she needed any assistance to please call.

## 2015-01-29 NOTE — Significant Event (Signed)
Patient has been transferred to [facility], traveled via wheelchair. VS stable prior and during the transfer. All belongings taken with patient (glasses, cellphone, Games developer). Patient's significant other at bedside. Patient settled in chair per her requests. Report given to receiving RN. Staff in room prior to RN leaving.

## 2015-01-29 NOTE — Progress Notes (Signed)
Was called by OT around 1700 about patient missing the toilet and being slowly lowered to the floor. On arrival to the room the patient was alert oriented and reports no pain. Patient was assessed and there are no signs of injuries related to fall. Vital signs were taken . Patient was safely  assisted to the standing position by the therapist, nurses and aide. Attending physician Halford Chessman was notified and family was notified. Patient is now eating dinner laughing and talking with family members. Will continue to monitor.

## 2015-01-29 NOTE — Progress Notes (Addendum)
Name: [name] MRN: [id number] DOB: 07/23/1951    ADMISSION DATE:  01/27/2015 CONSULTATION DATE:  5/4  REFERRING MD :  [name]  CHIEF COMPLAINT:  Acute respiratory failure   BRIEF PATIENT DESCRIPTION:  64 yo female with Lt breast cancer s/p Lt partial mastectomy 5/03.  She was unarousable at home after discharge.  Went to [place] >> hypercapnic respiratory failure, hypotensive, AKI, hyperkalemia.  She has hx of asthma, OSA, Morbid obesity, AF on xarelto.  She has been followed in pulmonary office by Dr. Melvyn Murphy.  SIGNIFICANT EVENTS  5/03 Lt partial mastectomy 5/04 Admit 5/06 to telemetry  STUDIES:  SUBJECTIVE:  Feels better.  Anxious to go home.  Still has cough.  VITAL SIGNS: Temp:  [97.6 F (36.4 C)-99.5 F (37.5 C)] 98 F (36.7 C) (05/06 0746) Pulse Rate:  [62-83] 70 (05/06 0800) Resp:  [13-26] 17 (05/06 0800) BP: (125-191)/(51-145) 135/104 mmHg (05/06 0800) SpO2:  [93 %-100 %] 95 % (05/06 0800) FiO2 (%):  [40 %] 40 % (05/06 0400) Weight:  [277 lb 1.9 oz (125.7 kg)] 277 lb 1.9 oz (125.7 kg) (05/06 0452)  PHYSICAL EXAMINATION: General:  No distress, sitting in chair Neuro:  Normal strength HEENT: no sinus tenderness Cardiovascular: regular Lungs: scattered faint crackles Abdomen:  Soft, non-tender Musculoskeletal:  No edema Skin: no rashes  CBC Recent Labs     01/28/15  0450  01/29/15  0301  WBC  10.5  6.9  HGB  8.8*  8.1*  HCT  29.2*  26.1*  PLT  296  222   BMET Recent Labs     01/27/15  1705  01/28/15  0450  01/29/15  0301  NA  139  139  140  K  5.0  4.4  4.0  CL  102  104  105  CO2  25  25  26   BUN  35*  37*  26*  CREATININE  2.95*  2.53*  1.48*  GLUCOSE  131*  168*  123*    Electrolytes Recent Labs     01/27/15  1705  01/28/15  0450  01/29/15  0301  CALCIUM  8.2*  8.1*  8.5*  MG  1.9  2.0   --   PHOS  7.4*  5.4*   --     Sepsis Markers Recent Labs     01/27/15  1705  PROCALCITON  0.58    ABG Recent Labs   01/27/15  1723  PHART  7.308*  PCO2ART  50.1*  PO2ART  83.0    Liver Enzymes Recent Labs     01/27/15  1705  01/28/15  1032  01/29/15  0301  AST  3800*  1196*  492*  ALT  1529*  862*  587*  ALKPHOS  72  65  65  BILITOT  0.7  0.5  0.6  ALBUMIN  3.3*  3.0*  3.0*    Cardiac Enzymes Recent Labs     01/27/15  1705  01/27/15  2240  01/28/15  0450  TROPONINI  0.36*  0.35*  0.26*    Glucose Recent Labs     01/27/15  1523  01/27/15  1549  GLUCAP  122*  131*    Imaging Dg Chest Port 1 View  01/29/2015   CLINICAL DATA:  Aspiration pneumonia  EXAM: PORTABLE CHEST - 1 VIEW  COMPARISON:  01/27/2015  FINDINGS: There is marked enlargement of the cardiac silhouette, unchanged. There probably is confluent consolidation in the left base but evaluation is  limited due to body habitus. No pneumothorax is evident.  IMPRESSION: Probable left base consolidation. Significantly limited study due to patient body habitus.   Electronically Signed   By: [name] M.D.   On: 01/29/2015 06:46      ASSESSMENT / PLAN: PULMONARY A: Acute hypoxic/hypercapnic respiratory failure in setting of OSA and possible OHS. Hx of Asthma. Possible aspiration pneumonitis. P: Oxygen to keep SpO2 88 to 95% >> assess for home oxygen Auto CPAP qhs Symbicort PRN BDs  F/u CXR intermittently  Cardiovascular A: Hypotension 2nd to hypovolemia >> resolved. CAF (Chads2 score: 3). P: D/c IV fluid 5/06 Continue flecainide, cardizem, zebeta Hold Lasix, Cozaar in setting of AKI >> might be able to resume 5/07 Hold xarelto in setting of elevated LFTs  Neuro: A: Acute Encephalopathy 2nd to hypercarbia and AKI >> improved. Deconditioning. P: Hold all narcotics Supportive care PT/OT  RENAL A: AKI 2nd to hypovolemia >> baseline creatinine 1.24 from 01/25/15 >> improving. Hx of CKD stage 3. P: Monitor renal fx, urine outpt  GI:  A:  GERD. abnml LFTs-->likely shock liver >> improving.   P: Advance diet F/u LFT's intermittently  Heme A: Anemia of chronic disease. Newly dx breast cancer. S/p partial mastectomy. P: F/u CBC SCDs  Endocrine A: Mild hyperglycemia.  P: Monitor blood sugar on BMET  ID: A:  Aspiration pneumonitis. P: Day 3 of Abx >> change to augmentin  Summary: Improved.  Will transfer to telemetry.  Change to auto CPAP qhs.  Will need to determine when to resume her oral anticoagulation for a fib, when to resume lasix/cozaar, and whether she needs home oxygen set up temporarily at home.  Chesley Mires, MD [facility] Pulmonary/Critical Care 01/29/2015, 9:37 AM Pager:  [phone] After 3pm call: [phone]

## 2015-01-30 DIAGNOSIS — N189 Chronic kidney disease, unspecified: Secondary | ICD-10-CM

## 2015-01-30 DIAGNOSIS — E1122 Type 2 diabetes mellitus with diabetic chronic kidney disease: Secondary | ICD-10-CM

## 2015-01-30 LAB — BASIC METABOLIC PANEL
Anion gap: 8 (ref 5–15)
BUN: 20 mg/dL (ref 6–20)
CHLORIDE: 102 mmol/L (ref 101–111)
CO2: 26 mmol/L (ref 22–32)
CREATININE: 1.02 mg/dL — AB (ref 0.44–1.00)
Calcium: 8.6 mg/dL — ABNORMAL LOW (ref 8.9–10.3)
GFR calc Af Amer: >60 mL/min (ref >60)
GFR calc non Af Amer: 56 mL/min — ABNORMAL LOW (ref >60)
GLUCOSE: 132 mg/dL — AB (ref 70–99)
Potassium: 3.5 mmol/L (ref 3.5–5.1)
Sodium: 136 mmol/L (ref 135–145)

## 2015-01-30 LAB — CBC
HCT: 27.3 % — ABNORMAL LOW (ref 36.0–46.0)
Hemoglobin: 8.4 g/dL — ABNORMAL LOW (ref 12.0–15.0)
MCH: 26.2 pg (ref 26.0–34.0)
MCHC: 30.8 g/dL (ref 30.0–36.0)
MCV: 85 fL (ref 78.0–100.0)
Platelets: 283 10*3/uL (ref 150–400)
RBC: 3.21 MIL/uL — ABNORMAL LOW (ref 3.87–5.11)
RDW: 16.6 % — ABNORMAL HIGH (ref 11.5–15.5)
WBC: 7.2 10*3/uL (ref 4.0–10.5)

## 2015-01-30 LAB — HEPATIC FUNCTION PANEL
ALT: 326 U/L — ABNORMAL HIGH (ref 14–54)
AST: 155 U/L — ABNORMAL HIGH (ref 15–41)
Albumin: 2.8 g/dL — ABNORMAL LOW (ref 3.5–5.0)
Alkaline Phosphatase: 60 U/L (ref 38–126)
Bilirubin, Direct: 0.2 mg/dL (ref 0.1–0.5)
Indirect Bilirubin: 0.4 mg/dL (ref 0.3–0.9)
Total Bilirubin: 0.6 mg/dL (ref 0.3–1.2)
Total Protein: 6.3 g/dL — ABNORMAL LOW (ref 6.5–8.1)

## 2015-01-30 LAB — GLUCOSE, CAPILLARY: Glucose-Capillary: 100 mg/dL — ABNORMAL HIGH (ref 70–99)

## 2015-01-30 NOTE — Progress Notes (Signed)
Name: [name] MRN: [id number] DOB: [birth year]    ADMISSION DATE:  01/27/2015 CONSULTATION DATE:  5/4  REFERRING MD :  Jake Samples  CHIEF COMPLAINT:  Acute respiratory failure   BRIEF PATIENT DESCRIPTION:  65 yo female with Lt breast cancer s/p Lt partial mastectomy 5/03.  She was unarousable at home after discharge.  Went to [place] >> hypercapnic respiratory failure, hypotensive, AKI, hyperkalemia.  She has hx of asthma, OSA, Morbid obesity, AF on xarelto.  She has been followed in pulmonary office by Dr. Melvyn Novas.  SIGNIFICANT EVENTS  5/03 Lt partial mastectomy 5/04 Admit 5/06 to telemetry     SUBJECTIVE:  Hard to arouse on arrival sitting in chair says sleeping fine on cpap from home/ somewhat congested sounding cough .  VITAL SIGNS: Temp:  [98.3 F (36.8 C)-98.6 F (37 C)] 98.5 F (36.9 C) (05/07 0543) Pulse Rate:  [60-73] 69 (05/07 0543) Resp:  [20-21] 20 (05/07 0543) BP: (124-147)/(57-104) 124/69 mmHg (05/07 0159) SpO2:  [92 %-98 %] 96 % (05/07 0956) Weight:  [240 lb 15.4 oz (109.3 kg)-247 lb 9.6 oz (112.311 kg)] 247 lb 9.6 oz (112.311 kg) (05/07 0543) FIO2 =  1lpm    PHYSICAL EXAMINATION: General:  No distress, sitting in chair Neuro:  Normal strength HEENT: no sinus tenderness Cardiovascular: regular Lungs: scattered insp and exp rhonchi  Abdomen:  Soft, non-tender Musculoskeletal:  No edema Skin: no rashes  CBC Recent Labs     01/28/15  0450  01/29/15  0301  01/30/15  0450  WBC  10.5  6.9  7.2  HGB  8.8*  8.1*  8.4*  HCT  29.2*  26.1*  27.3*  PLT  296  222  283   BMET Recent Labs     01/28/15  0450  01/29/15  0301  01/30/15  0450  NA  139  140  136  K  4.4  4.0  3.5  CL  104  105  102  CO2  25  26  26   BUN  37*  26*  20  CREATININE  2.53*  1.48*  1.02*  GLUCOSE  168*  123*  132*    Electrolytes Recent Labs     01/27/15  1705  01/28/15  0450  01/29/15  0301  01/30/15  0450  CALCIUM  8.2*  8.1*  8.5*  8.6*  MG  1.9  2.0    --    --   PHOS  7.4*  5.4*   --    --     Sepsis Markers Recent Labs     01/27/15  1705  PROCALCITON  0.58    ABG Recent Labs     01/27/15  1723  PHART  7.308*  PCO2ART  50.1*  PO2ART  83.0    Liver Enzymes Recent Labs     01/28/15  1032  01/29/15  0301  01/30/15  0450  AST  1196*  492*  155*  ALT  862*  587*  326*  ALKPHOS  65  65  60  BILITOT  0.5  0.6  0.6  ALBUMIN  3.0*  3.0*  2.8*    Cardiac Enzymes Recent Labs     01/27/15  1705  01/27/15  2240  01/28/15  0450  TROPONINI  0.36*  0.35*  0.26*    Glucose Recent Labs     01/27/15  1523  01/27/15  1549  01/30/15  0555  GLUCAP  122*  131*  100*  Imaging Dg Chest Port 1 View  01/29/2015   CLINICAL DATA:  Aspiration pneumonia  EXAM: PORTABLE CHEST - 1 VIEW  COMPARISON:  01/27/2015  FINDINGS: There is marked enlargement of the cardiac silhouette, unchanged. There probably is confluent consolidation in the left base but evaluation is limited due to body habitus. No pneumothorax is evident.  IMPRESSION: Probable left base consolidation. Significantly limited study due to patient body habitus.   Electronically Signed   By: Andreas Newport M.D.   On: 01/29/2015 06:46      ASSESSMENT / PLAN: PULMONARY A: Acute hypoxic/hypercapnic respiratory failure in setting of OSA and possible OHS. Hx of Asthma. Possible aspiration pneumonitis. P: Oxygen to keep SpO2 88 to 95% >> assess for home oxygen Auto CPAP qhs Symbicort continue PRN BDs  Repeat cxr 5/8   Cardiovascular A: Hypotension 2nd to hypovolemia >> resolved. CAF (Chads2 score: 3). P: D/cd IV fluid 5/06 Continue flecainide, cardizem, zebeta Hold Lasix, Cozaar in setting of AKI   Hold xarelto in setting of elevated LFTs, improving   Neuro: A: Acute Encephalopathy 2nd to hypercarbia and AKI >> improved. Deconditioning. P: Hold all narcotics Supportive care PT/OT  RENAL A: AKI 2nd to hypovolemia >> baseline creatinine 1.24 from  01/25/15 >> resolved 5/7 Hx of CKD stage 3. P: Monitor renal fx, urine outpt  GI:  A:  GERD. abnml LFTs-->likely shock liver >> improving.  P: Advance diet F/u LFT's intermittently  Heme A: Anemia of chronic disease. Newly dx breast cancer. S/p partial mastectomy. P: F/u CBC SCDs  Endocrine A: Mild hyperglycemia.  P: Monitor blood sugar on BMET  ID: A:  Aspiration pneumonitis. P:  - changed to augmentin 5/6 (which was day 3/10 of abx rx so plan rx to 5/13) Recheck cxr am 5/8    Discussed with pt, does not appear to be ready to go home yet/ still working on tapering off 02    Christinia Gully, MD Pulmonary and [place] [phone] After 5:30 PM or weekends, call [phone]

## 2015-01-30 NOTE — Evaluation (Signed)
Physical Therapy Evaluation Patient Details Name: [name] MRN: [id number] DOB: 04/25/1951 Today's Date: 01/30/2015   History of Present Illness  Pt is a 64 y.o. Female with PMH COPD, obesity, AFib on xarelto, fibromyalgia, mammary carcinoma of left breast who is s/p left partial mastectomy on 5/4. Pt d/c home after surgery and was unarousable the following morning. Family took pt to ER and Pt was transferred from [facility] ER with acute hypercarbic respiratory failure, volume responsive hypotension, AKI, and hyperkalemia.   Clinical Impression  Pt with eyes closed on arrival and initially difficulty maintaining eyes open throughout questions and bed mobility. Pt with decreased safety awareness, memory, problem solving, strength and function who will benefit from acute therapy to maximize mobility, function and independence to decrease fall risk and burden of care. If pt does not have 24hr supervision at home would recommend ST-SNF.     Follow Up Recommendations Home health PT;Supervision/Assistance - 24 hour    Equipment Recommendations  None recommended by PT    Recommendations for Other Services       Precautions / Restrictions Precautions Precautions: Fall      Mobility  Bed Mobility Overal bed mobility: Needs Assistance Bed Mobility: Supine to Sit     Supine to sit: Min assist     General bed mobility comments: pt required assist and cues to bring legs to EOB and elevate trunk from surface  Transfers Overall transfer level: Needs assistance   Transfers: Sit to/from Stand Sit to Stand: Min assist         General transfer comment: cues for hand placement and safety to fully push from surface and back fully before sitting. Pt stood from bed, BSC x 2 and chair x 1. With each stand from [facility] pt would require 2 attempts due to posterior LOB  Ambulation/Gait Ambulation/Gait assistance: Min guard Ambulation Distance (Feet): 80 Feet Assistive device: Rolling  walker (2 wheeled) Gait Pattern/deviations: Step-through pattern;Decreased stride length;Trunk flexed   Gait velocity interpretation: Below normal speed for age/gender General Gait Details: 2 trials of 51' with pt not accurately judging fatigue with needing to sit quickly in chair despite frequent cues for self regulation, chair to follow with seated rest between trials. cues for posture and position in RW as pt tends to remain too posterior to [employer]            Wheelchair Mobility    Modified Rankin (Stroke Patients Only)       Balance Overall balance assessment: Needs assistance   Sitting balance-Leahy Scale: Fair       Standing balance-Leahy Scale: Poor                               Pertinent Vitals/Pain Pain Assessment: 0-10 Pain Score: 4  Pain Location: RUE Pain Descriptors / Indicators: Aching Pain Intervention(s): Repositioned;Limited activity within patient's tolerance      Home Living Family/patient expects to be discharged to:: Private residence Living Arrangements: Alone Available Help at Discharge: Family;Friend(s);Available 24 hours/day Type of Home: House Home Access: Stairs to enter     Home Layout: One level Home Equipment: Environmental consultant - 4 wheels;Walker - 2 wheels;Bedside commode;Wheelchair - manual;Shower seat      Prior Function Level of Independence: Independent         Comments: pt reports she lives alone, swims in her indoor pool daily, cares for the pool herself, and gardens- no family present to confirm  Hand Dominance        Extremity/Trunk Assessment   Upper Extremity Assessment: Generalized weakness           Lower Extremity Assessment: Generalized weakness      Cervical / Trunk Assessment: Normal  Communication   Communication: No difficulties  Cognition Arousal/Alertness: Lethargic Behavior During Therapy: Flat affect Overall Cognitive Status: Impaired/Different from baseline Area of  Impairment: Safety/judgement         Safety/Judgement: Decreased awareness of safety;Decreased awareness of deficits          General Comments      Exercises        Assessment/Plan    PT Assessment Patient needs continued PT services  PT Diagnosis Difficulty walking;Generalized weakness   PT Problem List Decreased strength;Decreased cognition;Decreased activity tolerance;Decreased balance;Decreased mobility;Decreased knowledge of use of DME  PT Treatment Interventions Gait training;DME instruction;Functional mobility training;Therapeutic activities;Stair training;Therapeutic exercise;Patient/family education   PT Goals (Current goals can be found in the Care Plan section) Acute Rehab PT Goals Patient Stated Goal: to go home PT Goal Formulation: With patient Time For Goal Achievement: 02/13/15 Potential to Achieve Goals: Fair    Frequency Min 3X/week   Barriers to discharge Decreased caregiver support pt reports 24 hr care between boyfriend and  sister but story varied 3x within session with no family present to confirm    Co-evaluation               End of Session Equipment Utilized During Treatment: Gait belt Activity Tolerance: Patient tolerated treatment well Patient left: in chair;with call bell/phone within reach;with chair alarm set Nurse Communication: Mobility status;Precautions         Time: [phone] PT Time Calculation (min) (ACUTE ONLY): 28 min   Charges:   PT Evaluation $Initial PT Evaluation Tier I: 1 Procedure PT Treatments $Gait Training: 8-22 mins   PT G CodesMelford Aase 01/30/2015, 1:37 PM Elwyn Reach, [place]

## 2015-01-31 ENCOUNTER — Encounter (HOSPITAL_COMMUNITY): Payer: Self-pay | Admitting: *Deleted

## 2015-01-31 ENCOUNTER — Inpatient Hospital Stay (HOSPITAL_COMMUNITY): Payer: Medicare Other

## 2015-01-31 MED ORDER — AMOXICILLIN-POT CLAVULANATE 500-125 MG PO TABS
1.0000 | ORAL_TABLET | Freq: Three times a day (TID) | ORAL | Status: DC
Start: 2015-01-31 — End: 2015-02-19

## 2015-01-31 MED ORDER — OXYCODONE-ACETAMINOPHEN 5-325 MG PO TABS: 1.0000 | ORAL_TABLET | ORAL | Status: DC | PRN

## 2015-01-31 NOTE — Progress Notes (Signed)
Pt has orders to be discharged. Discharge instructions given and pt has no additional questions at this time. Medication regimen reviewed and pt educated. Pt verbalized understanding and has no additional questions. Telemetry box removed. IV removed and site in good condition. Pt stable and waiting for transportation.   Alanson Hausmann RN 

## 2015-01-31 NOTE — Progress Notes (Signed)
Name: [name] MRN: [id number] DOB: 1951-06-30    ADMISSION DATE:  01/27/2015 CONSULTATION DATE:  5/4  REFERRING MD :  Jake Samples  CHIEF COMPLAINT:  Acute respiratory failure   BRIEF PATIENT DESCRIPTION:  64 yo female with Lt breast cancer s/p Lt partial mastectomy 5/03.  She was unarousable at home after discharge.  Went to [facility] >> hypercapnic respiratory failure, hypotensive, AKI, hyperkalemia.  She has hx of asthma, OSA, Morbid obesity, AF on xarelto.  She has been followed in pulmonary office by Dr. Melvyn Novas.  SIGNIFICANT EVENTS  5/03 Lt partial mastectomy 5/04 Admit 5/06 to telemetry     SUBJECTIVE:  Up and walking in room on RA  VITAL SIGNS: Temp:  [97.9 F (36.6 C)-98.5 F (36.9 C)] 98.4 F (36.9 C) (05/08 0622) Pulse Rate:  [59-66] 60 (05/08 0622) Resp:  [18-20] 18 (05/08 0622) BP: (98-157)/(56-72) 98/72 mmHg (05/08 0622) SpO2:  [97 %-100 %] 99 % (05/08 0943) Weight:  [247 lb 7.1 oz (112.24 kg)] 247 lb 7.1 oz (112.24 kg) (05/08 0622) FIO2 =  2lpm but not using it consistently     PHYSICAL EXAMINATION: General:  No distress, ambulating in room, standing at sink on my arrial  Neuro:  Normal strength HEENT: no sinus tenderness Cardiovascular: regular Lungs: scattered insp and exp rhonchi  Abdomen:  Soft, non-tender Musculoskeletal:  No edema Skin: no rashes  CBC Recent Labs     01/29/15  0301  01/30/15  0450  WBC  6.9  7.2  HGB  8.1*  8.4*  HCT  26.1*  27.3*  PLT  222  283   BMET Recent Labs     01/29/15  0301  01/30/15  0450  NA  140  136  K  4.0  3.5  CL  105  102  CO2  26  26  BUN  26*  20  CREATININE  1.48*  1.02*  GLUCOSE  123*  132*    Electrolytes Recent Labs     01/29/15  0301  01/30/15  0450  CALCIUM  8.5*  8.6*    Sepsis Markers No results for input(s): PROCALCITON, O2SATVEN in the last 72 hours.  Invalid input(s): LACTICACIDVEN  ABG No results for input(s): PHART, PCO2ART, PO2ART in the last 72  hours.  Liver Enzymes Recent Labs     01/28/15  1032  01/29/15  0301  01/30/15  0450  AST  1196*  492*  155*  ALT  862*  587*  326*  ALKPHOS  65  65  60  BILITOT  0.5  0.6  0.6  ALBUMIN  3.0*  3.0*  2.8*    Cardiac Enzymes No results for input(s): TROPONINI, PROBNP in the last 72 hours.  Glucose Recent Labs     01/30/15  0555  GLUCAP  100*    Imaging Dg Chest 2 View  01/31/2015   CLINICAL DATA:  Chest tightness.  Cough.  History of asthma.  EXAM: CHEST  2 VIEW  COMPARISON:  01/29/2015.  FINDINGS: Enlarged cardiac silhouette with an interval decrease in size. Minimal linear density at the left lung base. Otherwise, clear lungs with normal vascularity. Mild to moderate scoliosis.  IMPRESSION: Minimal linear atelectasis or scarring at the left lung base with improved cardiomegaly.   Electronically Signed   By: Claudie Revering M.D.   On: 01/31/2015 09:21      ASSESSMENT / PLAN: PULMONARY A: Acute hypoxic/hypercapnic respiratory failure in setting of OSA and possible OHS. Hx of  Asthma. Possible aspiration pneumonitis. P: Oxygen to keep SpO2 88 to 95% >> assess for home oxygen prior to d/c Continue cpap as per prior setting per [facility] Symbicort continue > advised pt if not doing well and esp if need systemic steroids we need to see her in the [facility]/ otherwise f/u prn  PRN BDs      Cardiovascular A: Hypotension 2nd to hypovolemia >> resolved. CAF (Chads2 score: 3). P: D/cd IV fluid 5/06 Continue flecainide, cardizem, zebeta Holding  Lasix, Cozaar in setting of AKI  > no need for either for now Hold xarelto in setting of elevated LFTs, improving > ok to resume as pta  Neuro: A: Acute Encephalopathy 2nd to hypercarbia and AKI >> improved. Deconditioning. P: PT/OT Explained should use tylenol prn and only use vicodin as backup but use minimally if possible  RENAL A: AKI 2nd to hypovolemia >> baseline creatinine 1.24 from 01/25/15 >> resolved 5/7 Hx  of CKD stage 3. P: Hold off on cozar and defer restart to primary   GI:  A:  GERD. abnml LFTs-->likely shock liver >> improving.  P: No f/u needed   Heme A: Anemia of chronic disease. Newly dx breast cancer. S/p partial mastectomy. P: Resolving so ok to resume xarelto s loading doses   Endocrine A: Mild hyperglycemia.  P: No f/u needed   ID: A:  Aspiration pneumonitis. P:  - changed to augmentin 5/6 (which was day 3/10 of abx rx so plan rx to 5/13)    Pt assures me she has 24 h care at home and declines snf/ rehab so set up for d/c this afternoon but needs recheck sats first ra   Christinia Gully, MD Pulmonary and [place] [phone] After 5:30 PM or weekends, call [phone]

## 2015-01-31 NOTE — Discharge Summary (Signed)
Physician Discharge Summary  Patient ID: [name] MRN: [id number] DOB/AGE: Mar 24, 1951 64 y.o.  Admit date: 01/27/2015 Discharge date: 01/31/2015  Problem List Principal Problem:   Acute respiratory failure Active Problems:   OBESITY, MORBID   Obstructive sleep apnea   Atrial fibrillation   DM2 (diabetes mellitus, type 2)   Acute renal failure HPI:   This is a 64 year old female w/ h/o asthma, OSA w/ BMI 41, AF on xarelto, GERD, Fibromyalgia and newly diagnosed invasive mammary carcinoma of the left breast. She is s/p left partial mastectomy 5/3. Went home. Went to sleep in the afternoon 5/3. Late morning on 5/4 family noted she had not been out of bed. They found her in bed and were not able to wake her. EMS was called. On EMS arrival sats were on 80s. Got Narcan. Placed on BIPAP. Given 2 liters of NS for initial BP in 70s. Further dx eval showed AKI w/ scr 2.6, and respiratory acidosis. Of note pt did not use her CPAP the night before. By time transport arrived to bring her to [facility] she was awake and tolerating [place].  Hospital Course:  SIGNIFICANT EVENTS  5/03 Lt partial mastectomy 5/04 Admit 5/06 to telemetry 5/08 dc home    ASSESSMENT / PLAN: PULMONARY A: Acute hypoxic/hypercapnic respiratory failure in setting of OSA and possible OHS. Hx of Asthma. Possible aspiration pneumonitis. P: Oxygen to keep SpO2 88 to 95% >> assess for home oxygen prior to d/c Continue cpap as per prior setting per [facility] Symbicort continue > advised pt if not doing well and esp if need systemic steroids we need to see her in the [facility]/ otherwise f/u prn  PRN BDs     Cardiovascular A: Hypotension 2nd to hypovolemia >> resolved. CAF (Chads2 score: 3). P: D/cd IV fluid 5/06 Continue flecainide, cardizem, zebeta Holding Lasix, Cozaar in setting of AKI > no need for either for now Hold xarelto in setting of elevated LFTs, improving > ok to resume as pta, 20  mg daily dossing  Neuro: A: Acute Encephalopathy 2nd to hypercarbia and AKI >> improved. Deconditioning. P: PT/OT Explained should use tylenol prn and only use vicodin as backup but use minimally if possible  RENAL Lab Results  Component Value Date   CREATININE 1.02* 01/30/2015   CREATININE 1.48* 01/29/2015   CREATININE 2.53* 01/28/2015    A: AKI 2nd to hypovolemia >> baseline creatinine 1.24 from 01/25/15 >> resolved 5/7 Hx of CKD stage 3. P: Hold off on cozar and defer restart to primary   GI:  A:  GERD. abnml LFTs-->likely shock liver >> improving.  P: No f/u needed   Heme  Recent Labs  01/29/15 0301 01/30/15 0450  HGB 8.1* 8.4*    A: Anemia of chronic disease. Newly dx breast cancer. S/p partial mastectomy. P: Resolving so ok to resume xarelto s loading doses   Endocrine A: Mild hyperglycemia.  P: No f/u needed   ID: A:  Aspiration pneumonitis. P: - changed to augmentin 5/6 (which was day 3/10 of abx rx so plan rx to 5/13)   Pt assures me she has 24 h care at home and declines snf/ rehab so set up for d/c this afternoon but needs recheck sats first ra. Rechecked and sats 99% on room air prior to discharge,   Labs at discharge Lab Results  Component Value Date   CREATININE 1.02* 01/30/2015   BUN 20 01/30/2015   NA 136 01/30/2015   K 3.5  01/30/2015   CL 102 01/30/2015   CO2 26 01/30/2015   Lab Results  Component Value Date   WBC 7.2 01/30/2015   HGB 8.4* 01/30/2015   HCT 27.3* 01/30/2015   MCV 85.0 01/30/2015   PLT 283 01/30/2015   Lab Results  Component Value Date   ALT 326* 01/30/2015   AST 155* 01/30/2015   ALKPHOS 60 01/30/2015   BILITOT 0.6 01/30/2015   Lab Results  Component Value Date   INR 1.18 01/25/2015   INR 1.09 02/19/2014   INR 1.48 02/22/2012    Current radiology studies Dg Chest 2 View  01/31/2015   CLINICAL DATA:  Chest tightness.  Cough.  History of asthma.  EXAM: CHEST  2 VIEW  COMPARISON:   01/29/2015.  FINDINGS: Enlarged cardiac silhouette with an interval decrease in size. Minimal linear density at the left lung base. Otherwise, clear lungs with normal vascularity. Mild to moderate scoliosis.  IMPRESSION: Minimal linear atelectasis or scarring at the left lung base with improved cardiomegaly.   Electronically Signed   By: Claudie Revering M.D.   On: 01/31/2015 09:21    Disposition:  01-Home or Self Care  Discharge Instructions    Discharge patient    Complete by:  As directed             Medication List    STOP taking these medications        ALPRAZolam 0.5 MG tablet  Commonly known as:  XANAX     BOOSTRIX 5-2.5-18.5 LF-MCG/0.5 injection  Generic drug:  Tdap     celecoxib 200 MG capsule  Commonly known as:  CELEBREX     furosemide 20 MG tablet  Commonly known as:  LASIX     losartan 100 MG tablet  Commonly known as:  COZAAR     promethazine 25 MG tablet  Commonly known as:  PHENERGAN      TAKE these medications        acetaminophen 500 MG tablet  Commonly known as:  TYLENOL  Take 1,000-1,500 mg by mouth 2 (two) times daily as needed for headache (migraines).     albuterol 108 (90 BASE) MCG/ACT inhaler  Commonly known as:  PROVENTIL HFA;VENTOLIN HFA  Inhale 2 puffs into the lungs every 4 (four) hours as needed for wheezing or shortness of breath.     amoxicillin-clavulanate 500-125 MG per tablet  Commonly known as:  AUGMENTIN  Take 1 tablet (500 mg total) by mouth 3 (three) times daily.     bisoprolol 10 MG tablet  Commonly known as:  ZEBETA  Take 1 tablet (10 mg total) by mouth daily.     diltiazem 180 MG 24 hr capsule  Commonly known as:  CARDIZEM CD  Take 180 mg by mouth daily.     flecainide 50 MG tablet  Commonly known as:  TAMBOCOR  Take 1 tablet (50 mg total) by mouth 2 (two) times daily.     fluticasone 50 MCG/ACT nasal spray  Commonly known as:  FLONASE  Place 2 sprays into both nostrils daily as needed for allergies.      omeprazole 40 MG capsule  Commonly known as:  PRILOSEC  take 1 capsule by mouth once daily before BREAKFAST     oxyCODONE-acetaminophen 5-325 MG per tablet  Commonly known as:  ROXICET  Take 1 tablet by mouth every 4 (four) hours as needed for severe pain.     PARoxetine 20 MG tablet  Commonly known as:  PAXIL  Take 20 mg by mouth daily.     rivaroxaban 20 MG Tabs tablet  Commonly known as:  XARELTO  Take 1 tablet (20 mg total) by mouth daily with supper.     SSD 1 % cream  Generic drug:  silver sulfADIAZINE  Apply 1 application topically 2 (two) times daily as needed (wound).     SYMBICORT 80-4.5 MCG/ACT inhaler  Generic drug:  budesonide-formoterol  inhale 2 puffs by mouth twice a day Rinse mouth after use           Follow-up Information    Call Christinia Gully, MD.   Specialty:  Pulmonary Disease   Why:  Call and make an appoinment to see Dr. Melvyn Novas.    Contact information:   [address] [place] [address] [phone]        Discharged Condition: Fair  Time spent on discharge greater than 40 minutes.  Vital signs at Discharge. Temp:  [97.9 F (36.6 C)-98.5 F (36.9 C)] 98.4 F (36.9 C) (05/08 0622) Pulse Rate:  [59-66] 60 (05/08 0622) Resp:  [18-20] 18 (05/08 0622) BP: (98-157)/(56-72) 98/72 mmHg (05/08 0622) SpO2:  [97 %-100 %] 99 % (05/08 0943) Weight:  [247 lb 7.1 oz (112.24 kg)] 247 lb 7.1 oz (112.24 kg) (05/08 0622)    I personally supervised this discharge and reviewed chart/ examined pt/ reviewed xrays and d/c plans. See progress note same date   Christinia Gully, MD Pulmonary and [place] [phone] After 5:30 PM or weekends, call [phone]

## 2015-01-31 NOTE — Progress Notes (Addendum)
1200 pt ride here and pt is ready to leave however pt states "my CPAP case was here and now its missing". CPAP machine and tubing are at bedside at this moment. RT April called and stated CPAP machine and supplies were at bedside this AM during treatment. Security called and notified. 70 Pt family calls and states that the bag for CPAP must be at home.

## 2015-02-02 ENCOUNTER — Telehealth (HOSPITAL_COMMUNITY): Payer: Self-pay | Admitting: *Deleted

## 2015-02-02 NOTE — Telephone Encounter (Signed)
Specimen just sent today to [facility] by Pathology per [facility] in Pathology.

## 2015-02-09 ENCOUNTER — Telehealth: Payer: Self-pay | Admitting: Physical Therapy

## 2015-02-09 NOTE — Telephone Encounter (Signed)
Called pt x2 ,phone does not take message

## 2015-02-11 ENCOUNTER — Encounter (HOSPITAL_COMMUNITY): Payer: Self-pay

## 2015-02-11 ENCOUNTER — Ambulatory Visit (HOSPITAL_COMMUNITY): Payer: Medicare Other | Admitting: Hematology & Oncology

## 2015-02-15 ENCOUNTER — Other Ambulatory Visit: Payer: Medicare Other

## 2015-02-17 ENCOUNTER — Other Ambulatory Visit: Payer: Self-pay | Admitting: Internal Medicine

## 2015-02-17 ENCOUNTER — Encounter (HOSPITAL_COMMUNITY): Payer: Self-pay | Admitting: *Deleted

## 2015-02-17 ENCOUNTER — Encounter (HOSPITAL_COMMUNITY): Payer: Self-pay | Admitting: Hematology & Oncology

## 2015-02-19 ENCOUNTER — Encounter (HOSPITAL_COMMUNITY): Payer: Medicare Other | Attending: Hematology & Oncology | Admitting: Hematology & Oncology

## 2015-02-19 VITALS — BP 141/59 | HR 56 | Temp 98.4°F | Resp 20 | Wt 238.0 lb

## 2015-02-19 DIAGNOSIS — C50212 Malignant neoplasm of upper-inner quadrant of left female breast: Secondary | ICD-10-CM

## 2015-02-19 DIAGNOSIS — Z170 Estrogen receptor positive status [ER+]: Secondary | ICD-10-CM | POA: Diagnosis not present

## 2015-02-19 DIAGNOSIS — N183 Chronic kidney disease, stage 3 (moderate): Secondary | ICD-10-CM

## 2015-02-19 DIAGNOSIS — C50912 Malignant neoplasm of unspecified site of left female breast: Secondary | ICD-10-CM

## 2015-02-19 DIAGNOSIS — D631 Anemia in chronic kidney disease: Secondary | ICD-10-CM

## 2015-02-19 NOTE — Progress Notes (Signed)
[facility] PROGRESS NOTE  Patient Care Team: [name], MD as PCP - General (Internal Medicine)  CHIEF COMPLAINTS/PURPOSE OF CONSULTATION:  Left Breast cancer, pT1c, pN0, M0 ER + (100%) PR+ (99%) Ki-67 45% HER-2/NEU by CISH Negative. Note that the tumor appears to show heterogeneity in regards to HER 2 expression. Oncotype Recurrence Score Result of 2, Low Risk  HISTORY OF PRESENTING ILLNESS:  [name] 64 y.o. female is here because of newly diagnosed Left breast cancer. She completed definitive surgical therapy and did well. Final staging was a stage I L breast cancer. She is here today to review the results of her Oncotype Assay. She has brought a friend with her for support.  She hasn't had any chest pain, breathing problems, or pain from her surgery. She has genetic counseling scheduled for next Thursday, 02/25/2015.  MEDICAL HISTORY:  Past Medical History  Diagnosis Date  . Asthma     Spirometry 1989: FEV1 2.12 (71%) with ratio 82.  HFA 90% after coaching 11-09-10  . GERD (gastroesophageal reflux disease)     EGD with HH / esophagitis 06-30-93  . Osteoarthritis   . DDD (degenerative disc disease)   . Pelvic fracture   . Hiatal hernia   . Migraine   . Anxiety disorder   . Anemia   . Diverticulosis   . Adenomatous colon polyp 1993  . Atrial flutter chadsvasc of 3 (11/19/14)    Status post cardioversion 2-12 2013. s/p RFCA 5/13 with JAllred (coumadin and amio stopped)   . Essential hypertension, benign   . Pneumonia   . Fibromyalgia   . Lipoma of colon   . Internal and external hemorrhoids without complication   . Breast cancer     left  . PONV (postoperative nausea and vomiting)   . Sleep apnea     cpap  . Anxiety   . History of atrial fibrillation     SURGICAL HISTORY: Past Surgical History  Procedure Laterality Date  . Total knee arthroplasty      x2 09/2006  . Rotator cuff repair      bilateral  . A flutter ablation   02/22/2012  . Dilation and curettage of uterus    . Colonoscopy  05/2010, 07/2000  . Esophagogastroduodenoscopy  05/2010, 06/1993  . Cardioversion N/A 02/19/2014    Procedure: CARDIOVERSION;  Surgeon: Dorothy Spark, MD;  Location: [facility];  Service: Cardiovascular;  Laterality: N/A;  . Atrial flutter ablation N/A 02/22/2012    Procedure: ATRIAL FLUTTER ABLATION;  Surgeon: Thompson Grayer, MD;  Location: [facility] CATH LAB;  Service: Cardiovascular;  Laterality: N/A;  . Breast biopsy Left 11/2014  . Joint replacement    . Rotator cuff repair      bil  . Radioactive seed guided mastectomy with axillary sentinel lymph node biopsy Left 01/26/2015    Procedure: LEFT  PARTIAL MASTECTOMY WITH  RADIOACTIVE SEED LOCALIZATION, LEFT AXILLARY SENTINEL LYMPH NODE BIOPSY;  Surgeon: Fanny Skates, MD;  Location: [facility];  Service: General;  Laterality: Left;    SOCIAL HISTORY: History   Social History  . Marital Status: Widowed    Spouse Name: N/A  . Number of Children: 0  . Years of Education: N/A   Occupational History  . Accounting tech at [employer] History Main Topics  . Smoking status: Former Smoker -- 1.00 packs/day for 20 years    Types: Cigarettes    Quit date: 09/25/1984  .  Smokeless tobacco: Never Used  . Alcohol Use: Yes     Comment: occ  . Drug Use: No  . Sexual Activity: Not Currently   Other Topics Concern  . 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 He has been widowed for 13 years. She was married for 1 year and 4 months and her husband past. She had been previously married and states she had infertility issues. She was a smoker and quit smoking 30 years ago. She notes she smoked a pack a day for 20 years.  FAMILY HISTORY: Family History  Problem Relation Age of Onset  . Emphysema Father     smoker  . Heart disease Father   . Breast  cancer Mother   . Breast cancer Sister   . Heart disease Sister   . Colon cancer Neg Hx    has no family status information on file.  Her mother died at the age of 41 from pneumonia. Her mother had breast cancer at the age of 6. Her sister also had breast cancer at the age of 28. She died from heart disease at the age of 63 however. He states her sister did have genetic testing and was told it was negative. 2 brothers are deceased from lung cancer. Her father died at 54 from COPD. She has one sister who is living and healthy. Maternal Aunt had breast cancer at the age of 30.  ALLERGIES:  is allergic to shellfish allergy; ace inhibitors; epinephrine; theophyllines; and propoxyphene hcl.  MEDICATIONS:  Current Outpatient Prescriptions  Medication Sig Dispense Refill  . acetaminophen (TYLENOL) 500 MG tablet Take 1,000-1,500 mg by mouth 2 (two) times daily as needed for headache (migraines).    Marland Kitchen albuterol (PROVENTIL HFA;VENTOLIN HFA) 108 (90 BASE) MCG/ACT inhaler Inhale 2 puffs into the lungs every 4 (four) hours as needed for wheezing or shortness of breath. 18 g 5  . ALPRAZolam (XANAX) 0.5 MG tablet Take 0.5 mg by mouth 2 (two) times daily as needed.   0  . bisoprolol (ZEBETA) 10 MG tablet Take 1 tablet (10 mg total) by mouth daily. 30 tablet 2  . diltiazem (CARDIZEM CD) 180 MG 24 hr capsule Take 180 mg by mouth daily.    . flecainide (TAMBOCOR) 50 MG tablet Take 1 tablet (50 mg total) by mouth 2 (two) times daily. 180 tablet 3  . fluticasone (FLONASE) 50 MCG/ACT nasal spray Place 2 sprays into both nostrils daily as needed for allergies.   0  . furosemide (LASIX) 20 MG tablet Take 20 mg by mouth daily.   0  . HYDROcodone-acetaminophen (NORCO/VICODIN) 5-325 MG per tablet Take 1 tablet by mouth every 6 (six) hours as needed.   0  . losartan (COZAAR) 100 MG tablet Take 100 mg by mouth daily.   1  . omeprazole (PRILOSEC) 40 MG capsule take 1 capsule by mouth once daily before BREAKFAST 90 capsule  0  . oxyCODONE-acetaminophen (ROXICET) 5-325 MG per tablet Take 1 tablet by mouth every 4 (four) hours as needed for severe pain. 30 tablet 0  . PARoxetine (PAXIL) 20 MG tablet Take 20 mg by mouth daily.    . rivaroxaban (XARELTO) 20 MG TABS tablet Take 1 tablet (20 mg total) by mouth daily with supper. 30 tablet 11  . SSD 1 % cream Apply 1 application topically 2 (two) times daily  as needed (wound).   0  . SYMBICORT 80-4.5 MCG/ACT inhaler inhale 2 puffs by mouth twice a day Rinse mouth after use 10.2 g 5   No current facility-administered medications for this visit.    ROS  Review of Systems  Constitutional: Negative for fever, chills, weight loss and malaise/fatigue.  HENT: Negative for congestion, hearing loss, nosebleeds, sore throat and tinnitus.   Eyes: Negative for blurred vision, double vision, pain and discharge.  Respiratory: Negative for cough, hemoptysis, sputum production, shortness of breath and wheezing.   Cardiovascular: Negative for chest pain, palpitations, claudication, leg swelling and PND.  Gastrointestinal: Negative for heartburn, nausea, vomiting, abdominal pain, diarrhea, constipation, blood in stool and melena.  Genitourinary: Negative for dysuria, urgency, frequency and hematuria.  Musculoskeletal: Negative for falls. Positive for joint and back pain Skin: Negative for itching and rash.  Neurological: Negative for dizziness, tingling, tremors, sensory change, speech change, focal weakness, seizures, loss of consciousness, weakness.  Endo/Heme/Allergies: Does not bruise/bleed easily.  Psychiatric/Behavioral: Negative for depression, suicidal ideas, memory loss and substance abuse. The patient is not nervous/anxious and does not have insomnia.   All other systems reviewed and are negative.   PHYSICAL EXAMINATION: ECOG PERFORMANCE STATUS: 1 - Symptomatic but completely ambulatory  Filed Vitals:   02/19/15 0907  BP: 141/59  Pulse: 56  Temp: 98.4 F (36.9 C)    Resp: 20   Filed Weights   02/19/15 0907  Weight: 238 lb (107.956 kg)     Physical Exam  Constitutional: She is oriented to person, place, and time and well-developed, well-nourished, and in no distress.      Needed help onto the exam table. HENT:  Head: Normocephalic and atraumatic.  Nose: Nose normal.  Mouth/Throat: Oropharynx is clear and moist. No oropharyngeal exudate.  Eyes: Conjunctivae and EOM are normal. Pupils are equal, round, and reactive to light. Right eye exhibits no discharge. Left eye exhibits no discharge. No scleral icterus.  Neck: Normal range of motion. Neck supple. No tracheal deviation present. No thyromegaly present.  Cardiovascular: Normal rate, regular rhythm and normal heart sounds.  Exam reveals no gallop and no friction rub.   No murmur heard. Pulmonary/Chest: Effort normal and breath sounds normal. She has no wheezes. She has no rales.  Abdominal: Soft. Bowel sounds are normal. She exhibits no distension and no mass. There is no tenderness. There is no rebound and no guarding.  Musculoskeletal: Normal range of motion. She exhibits no edema.      Mild pitting edema on the left leg, chronic Lymphadenopathy:    She has no cervical adenopathy.  Neurological: She is alert and oriented to person, place, and time. She has normal reflexes. No cranial nerve deficit. Gait normal. Coordination normal.  Skin: Skin is warm and dry. No rash noted.  Psychiatric: Mood, memory, affect and judgment normal.  Nursing note and vitals reviewed. Breast EXAM: Left breast surgical incision site C/D/I   LABORATORY DATA:  I have reviewed the data as listed   Ref. Range 01/30/2015 04:50  Sodium Latest Ref Range: 135-145 mmol/L 136  Potassium Latest Ref Range: 3.5-5.1 mmol/L 3.5  Chloride Latest Ref Range: 101-111 mmol/L 102  CO2 Latest Ref Range: 22-32 mmol/L 26  BUN Latest Ref Range: 6-20 mg/dL 20  Creatinine Latest Ref Range: 0.44-1.00 mg/dL 1.02 (H)  Calcium Latest Ref  Range: 8.9-10.3 mg/dL 8.6 (L)  EGFR (Non-African Amer.) Latest Ref Range: >60 mL/min 56 (L)  EGFR (African American) Latest Ref Range: >60 mL/min >60  Glucose Latest Ref Range: 70-99 mg/dL 132 (H)  Anion gap Latest Ref Range: 5-15  8  Alkaline Phosphatase Latest Ref Range: 38-126 U/L 60  Albumin Latest Ref Range: 3.5-5.0 g/dL 2.8 (L)  AST Latest Ref Range: 15-41 U/L 155 (H)  ALT Latest Ref Range: 14-54 U/L 326 (H)  Total Protein Latest Ref Range: 6.5-8.1 g/dL 6.3 (L)  Bilirubin, Direct Latest Ref Range: 0.1-0.5 mg/dL 0.2  Indirect Bilirubin Latest Ref Range: 0.3-0.9 mg/dL 0.4  Total Bilirubin Latest Ref Range: 0.3-1.2 mg/dL 0.6  WBC Latest Ref Range: 4.0-10.5 K/uL 7.2  RBC Latest Ref Range: 3.87-5.11 MIL/uL 3.21 (L)  Hemoglobin Latest Ref Range: 12.0-15.0 g/dL 8.4 (L)  HCT Latest Ref Range: 36.0-46.0 % 27.3 (L)  MCV Latest Ref Range: 78.0-100.0 fL 85.0  MCH Latest Ref Range: 26.0-34.0 pg 26.2  MCHC Latest Ref Range: 30.0-36.0 g/dL 30.8  RDW Latest Ref Range: 11.5-15.5 % 16.6 (H)  Platelets Latest Ref Range: 150-400 K/uL 283    PATHOLOGIC STUDIES: FINAL DIAGNOSIS Diagnosis 1. Breast, lumpectomy, left - INVASIVE DUCTAL CARCINOMA, GRADE II/III, SPANNING 1.1 CM. - DUCTAL CARCINOMA IN SITU, LOW GRADE. - THE SURGICAL RESECTION MARGINS ARE NEGATIVE FOR CARCINOMA. - SEE ONCOLOGY TABLE BELOW. 2. Lymph node, sentinel, biopsy, left axillary #1 - THERE IS NO EVIDENCE OF CARCINOMA IN 1 OF 1 LYMPH NODE (0/1). 3. Lymph node, sentinel, biopsy, left axillary #2 - THERE IS NO EVIDENCE OF CARCINOMA IN 1 OF 1 LYMPH NODE (0/1). 4. Lymph node, sentinel, biopsy, left axillary #3 - THERE IS NO EVIDENCE OF CARCINOMA IN 1 OF 1 LYMPH NODE (0/1). Microscopic Comment 1. BREAST, INVASIVE TUMOR, WITH LYMPH NODES PRESENT Specimen, including laterality and lymph node sampling (sentinel, non-sentinel): Left breast and left axillary sentinel nodes. Procedure: Lumpectomy. Histologic type: Ductal Grade:  II Tubule formation: 3 Nuclear pleomorphism: 2 Mitotic:1. Tumor size (gross measurement): 1.1 cm Margins: Negative for carcinoma. Invasive, distance to closest margin: Greater than 1.0 cm to all margins. In-situ, distance to closest margin: Greater than 1.0 cm to all margins. Lymphovascular invasion: Not identified. Ductal carcinoma in situ: Present. Grade: Low grade. Extensive intraductal component: Not identified. Lobular neoplasia: Not identified. Tumor focality: Unifocal Treatment effect: N/A Extent of tumor: Confined to breast parenchyma. Lymph nodes: Examined: 3 Sentinel 0 Non-sentinel 3 Total Lymph nodes with metastasis: 0 Breast prognostic profile: [phone] Estrogen receptor: 100%, strong staining intensity. Progesterone receptor: 99% strong staining intensity. Her 2 neu: No amplification was detected. The ratio was 1.61. Her 2 neu by CISH will be repeated on the current case and the results reported separately. Ki-67: 45% Non-neoplastic breast: Fibrocystic changes with adenosis and healing biopsy site. TNM: pT1c, pN0. (JBK:gt, 01/27/15) Enid Cutter MD Pathologist, Electronic Signature (Case signed 01/27/2015) Specimen Gross and Clinical Information Specimen(s) Obtained: 1. Breast, lumpectomy, left 2. Lymph node, sentinel, biopsy, left axillary #1 3. Lymph node, sentinel, biopsy, left axillary #2 4. Lymph node, sentinel, biopsy, left axillary #3   ASSESSMENT & PLAN:  ER+ PR+ HER-2 Neu negative carcinoma L breast  pT1c, pN0, M0 Anemia Stage III CKD LLE cellulitis Oncotype Recurrence Score of 2, Low Risk   I reviewed her Oncotype score in detail. I advised her that she would receive minimal benefit from systemic chemotherapy. We discussed proceeding forward with radiation therapy. We will make sure she has an appointment already scheduled.  I would like to see her back in 6 weeks at which time we will review endocrine therapy in detail. She will need a  screening DEXA prior.  She has an underlying anemia and CKD. She has not had a more extensive anemia evaluation and we will certainly address this moving forward. She continues on XARELTO.  She has genetic counseling scheduled for next Thursday, 02/25/2015  All questions were answered. The patient knows to call the clinic with any problems, questions or concerns.  This note was electronically signed.    This document serves as a record of services personally performed by Ancil Linsey, MD. It was created on her behalf by Arlyce Harman, a trained medical scribe. The creation of this record is based on the scribe's personal observations and the provider's statements to them. This document has been checked and approved by the attending provider.   I have reviewed the above documentation for accuracy and completeness and I agree with the above.  Molli Hazard, MD

## 2015-02-19 NOTE — Patient Instructions (Signed)
[place] at [facility] Discharge Instructions  RECOMMENDATIONS MADE BY THE CONSULTANT AND ANY TEST RESULTS WILL BE SENT TO YOUR REFERRING PHYSICIAN.  Genetics counseling appointment as scheduled. We will make referral for Radiation Oncologist at [facility] in [place]. They will contact you with an appointment. Office visit in 6 weeks for follow-up. Report any issues/concerns to clinic as needed prior to appointments. Return as scheduled.  Thank you for choosing [place] at [facility] to provide your oncology and hematology care.  To afford each patient quality time with our provider, please arrive at least 15 minutes before your scheduled appointment time.    You need to re-schedule your appointment should you arrive 10 or more minutes late.  We strive to give you quality time with our providers, and arriving late affects you and other patients whose appointments are after yours.  Also, if you no show three or more times for appointments you may be dismissed from the clinic at the providers discretion.     Again, thank you for choosing [facility]  Our hope is that these requests will decrease the amount of time that you wait before being seen by our physicians.       _____________________________________________________________  Should you have questions after your visit to [facility], please contact our office at [phone] between the hours of 8:30 a.m. and 4:30 p.m.  Voicemails left after 4:30 p.m. will not be returned until the following business day.  For prescription refill requests, have your pharmacy contact our office.

## 2015-02-22 ENCOUNTER — Encounter (HOSPITAL_COMMUNITY): Payer: Self-pay | Admitting: Hematology & Oncology

## 2015-02-23 ENCOUNTER — Encounter (HOSPITAL_COMMUNITY): Payer: Self-pay | Admitting: Lab

## 2015-02-23 NOTE — Progress Notes (Signed)
Referral made to [facility]  Records faxed on 5/31

## 2015-02-24 ENCOUNTER — Telehealth: Payer: Self-pay | Admitting: Internal Medicine

## 2015-02-24 MED ORDER — OMEPRAZOLE 40 MG PO CPDR: DELAYED_RELEASE_CAPSULE | ORAL | Status: DC

## 2015-02-24 NOTE — Telephone Encounter (Signed)
Rx sent in as requested, patient has appointment in July.

## 2015-02-25 ENCOUNTER — Ambulatory Visit (HOSPITAL_BASED_OUTPATIENT_CLINIC_OR_DEPARTMENT_OTHER): Payer: Medicare Other | Admitting: Genetic Counselor

## 2015-02-25 ENCOUNTER — Other Ambulatory Visit: Payer: Medicare Other

## 2015-02-25 DIAGNOSIS — C50912 Malignant neoplasm of unspecified site of left female breast: Secondary | ICD-10-CM

## 2015-02-25 DIAGNOSIS — Z803 Family history of malignant neoplasm of breast: Secondary | ICD-10-CM | POA: Diagnosis not present

## 2015-02-25 DIAGNOSIS — Z315 Encounter for genetic counseling: Secondary | ICD-10-CM | POA: Diagnosis not present

## 2015-02-25 DIAGNOSIS — Z800 Family history of malignant neoplasm of digestive organs: Secondary | ICD-10-CM

## 2015-02-25 DIAGNOSIS — C50212 Malignant neoplasm of upper-inner quadrant of left female breast: Secondary | ICD-10-CM

## 2015-02-25 DIAGNOSIS — Z801 Family history of malignant neoplasm of trachea, bronchus and lung: Secondary | ICD-10-CM

## 2015-02-25 NOTE — Progress Notes (Signed)
REFERRING PROVIDER: [name], MD [place], [place] [address]  PRIMARY PROVIDER:  [name]., MD  PRIMARY REASON FOR VISIT:  1. Breast cancer, left   2. Family history of breast cancer in mother   25. Family history of breast cancer in sister   79. Family history of breast cancer in female   61. Family history of colon cancer   6. Family history of lung cancer      HISTORY OF PRESENT ILLNESS:   Ms. Ilg, a 64 y.o. female, was seen for a [facility] cancer genetics consultation at the request of Dr. Woody Seller due to a personal history of breast cancer and a family history of breast, colon, and lung cancers.  Ms. Callins presents to clinic today to discuss the possibility of a hereditary predisposition to cancer, genetic testing, and to further clarify her future cancer risks, as well as potential cancer risks for family members.   In 2016, at the age of 21, Ms. Bader was diagnosed with invasive ductal carcinoma of the left breast. Hormone receptor status was ER/PR+, Her2-/Neu.  This was treated with radiation and left partial mastectomy.     CANCER HISTORY:    Breast cancer of upper-inner quadrant of left female breast   12/15/2014 Mammogram new 45m round nodule in left breast is indeterminate   12/17/2014 Imaging 6 mm lobulated nodule in the left breast is at low suspicion for malignancy. ultrasound guided biopsy recommended     HORMONAL RISK FACTORS:  Menarche was at age 64  First live birth at age - no children.  OCP use for  0 years.  Ovaries intact: yes.  Hysterectomy: no.  Menopausal status: postmenopausal.  HRT use: 0 years. Colonoscopy: yes; 1 tubulovillous adenomatous polyp w/ mild atypia in 1993; 1 tubular adenomatous in 2011. Mammogram within the last year: yes. Number of breast biopsies: 1. Up to date with pelvic exams:  Yes, one previous abnormal result with normal follow-up Any excessive radiation exposure in the past:  no  Past  Medical History  Diagnosis Date  . Asthma     Spirometry 1989: FEV1 2.12 (71%) with ratio 82.  HFA 90% after coaching 11-09-10  . GERD (gastroesophageal reflux disease)     EGD with HH / esophagitis 06-30-93  . Osteoarthritis   . DDD (degenerative disc disease)   . Pelvic fracture   . Hiatal hernia   . Migraine   . Anxiety disorder   . Anemia   . Diverticulosis   . Adenomatous colon polyp 1993  . Atrial flutter chadsvasc of 3 (11/19/14)    Status post cardioversion 2-12 2013. s/p RFCA 5/13 with [facility] (coumadin and amio stopped)   . Essential hypertension, benign   . Pneumonia   . Fibromyalgia   . Lipoma of colon   . Internal and external hemorrhoids without complication   . Breast cancer     left  . PONV (postoperative nausea and vomiting)   . Sleep apnea     cpap  . Anxiety   . History of atrial fibrillation     Past Surgical History  Procedure Laterality Date  . Total knee arthroplasty      x2 09/2006  . Rotator cuff repair      bilateral  . A flutter ablation  02/22/2012  . Dilation and curettage of uterus    . Colonoscopy  05/2010, 07/2000  . Esophagogastroduodenoscopy  05/2010, 06/1993  . Cardioversion N/A 02/19/2014    Procedure: CARDIOVERSION;  Surgeon: Dorothy Spark, MD;  Location: [facility];  Service: Cardiovascular;  Laterality: N/A;  . Atrial flutter ablation N/A 02/22/2012    Procedure: ATRIAL FLUTTER ABLATION;  Surgeon: Thompson Grayer, MD;  Location: [facility] CATH LAB;  Service: Cardiovascular;  Laterality: N/A;  . Breast biopsy Left 11/2014  . Joint replacement    . Rotator cuff repair      bil  . Radioactive seed guided mastectomy with axillary sentinel lymph node biopsy Left 01/26/2015    Procedure: LEFT  PARTIAL MASTECTOMY WITH  RADIOACTIVE SEED LOCALIZATION, LEFT AXILLARY SENTINEL LYMPH NODE BIOPSY;  Surgeon: Fanny Skates, MD;  Location: [facility];  Service: General;  Laterality: Left;    History   Social History  . Marital Status: Widowed    Spouse Name: N/A   . Number of Children: 0  . Years of Education: N/A   Occupational History  . Accounting tech at [employer] History Main Topics  . Smoking status: Former Smoker -- 1.00 packs/day for 20 years    Types: Cigarettes    Quit date: 09/25/1984  . Smokeless tobacco: Never Used  . Alcohol Use: Yes     Comment: occ  . Drug Use: No  . Sexual Activity: Not Currently   Other Topics Concern  . Not on file   Social History Narrative     FAMILY HISTORY:  We obtained a detailed, 4-generation family history.  Significant diagnoses are listed below: Family History  Problem Relation Age of Onset  . Emphysema Father     smoker  . Heart disease Father   . Breast cancer Mother 9  . Breast cancer Sister 58  . Heart Problems Sister   . Heart disease Sister   . Breast cancer Maternal Aunt 48  . Lung cancer Maternal Uncle 50    smoker  . Colon cancer Paternal Uncle 50  . Diabetes Maternal Grandmother   . Heart Problems Maternal Grandfather   . Stroke Paternal Grandfather   . Lung cancer Maternal Uncle 72    smoker  . Heart Problems Paternal Uncle   . Diabetes Paternal Grandmother     Patient's maternal ancestors are of Caucasian descent, and paternal ancestors are of Caucasian descent. There is no reported Ashkenazi Jewish ancestry. There is no known consanguinity.  GENETIC COUNSELING ASSESSMENT: CHIQUITA HECKERT is a 64 y.o. female with a personal history of breast cancer and family history of breast cancer which somewhat suggestive of a hereditary breast cancer syndrome and predisposition to cancer. We, therefore, discussed and recommended the following at today's visit.   DISCUSSION: We reviewed the characteristics, features and inheritance patterns of hereditary cancer syndromes. We also discussed genetic testing, including the appropriate family members to test, the process of testing, insurance coverage and turn-around-time for results. We  discussed the implications of a negative, positive and/or variant of uncertain significant result. We recommended Ms. Santiesteban pursue genetic testing for the 21-gene Breast/Ovarian Cancer panel through [facility].   Based on Ms. Brady-Johnston's personal and family history of cancer, she meets medical criteria for genetic testing. Despite that she meets criteria, she may still have an out of pocket cost. We discussed that if her out of pocket cost for testing is over $100, the laboratory will call and confirm whether she wants to proceed with testing.  If the out of pocket cost of testing is less than $100 she will be billed by the genetic testing laboratory.   PLAN: After considering  the risks, benefits, and limitations, Ms. Zipper  provided informed consent to pursue genetic testing and the blood sample was sent to [facility] for analysis of the Breast/Ovarian Cancer Panel (including ATM, BARD1, BRCA1, BRCA2, BRIP1, CDH1, CHEK2, EPCAM, FANCC, MLH1, MSH2, MSH6, NBN, PALB2, PMS2, PTEN, RAD51C, RAD51D, STK11, TP53, and XRCC2 gene analysis). Results should be available within approximately 2-3 weeks' time, at which point they will be disclosed by telephone to Ms. Brady-Johnston, as will any additional recommendations warranted by these results. Ms. Kruczek will receive a summary of her genetic counseling visit and a copy of her results once available. This information will also be available in Epic. We encouraged Ms. Howley to remain in contact with cancer genetics annually so that we can continuously update the family history and inform her of any changes in cancer genetics and testing that may be of benefit for her family. Ms. Keddy questions were answered to her satisfaction today. Our contact information was provided should additional questions or concerns arise.  Thank you for the referral and allowing [place] to share in the care of your patient.   Jeanine Luz,  MS Genetic Counselor [email or web address] phone: [phone]  The patient was seen for a total of 60 minutes in face-to-face genetic counseling.  This patient was discussed with Drs. Magrinat, Lindi Adie and/or Burr Medico who agrees with the above.    _______________________________________________________________________ For Office Staff:  Number of people involved in session: 2 Was an Intern/ student involved with case: no

## 2015-03-01 ENCOUNTER — Ambulatory Visit: Payer: Medicare Other | Admitting: Physical Therapy

## 2015-03-03 ENCOUNTER — Ambulatory Visit: Payer: Medicare Other | Admitting: Physical Therapy

## 2015-03-08 ENCOUNTER — Other Ambulatory Visit: Payer: Self-pay | Admitting: Internal Medicine

## 2015-03-10 ENCOUNTER — Ambulatory Visit: Payer: Medicare Other

## 2015-03-10 ENCOUNTER — Ambulatory Visit: Payer: Medicare Other | Admitting: Radiation Oncology

## 2015-03-16 ENCOUNTER — Telehealth: Payer: Self-pay | Admitting: Internal Medicine

## 2015-03-16 ENCOUNTER — Telehealth: Payer: Self-pay

## 2015-03-16 ENCOUNTER — Other Ambulatory Visit: Payer: Self-pay

## 2015-03-16 MED ORDER — FLECAINIDE ACETATE 50 MG PO TABS: 50.0000 mg | ORAL_TABLET | Freq: Two times a day (BID) | ORAL | Status: DC

## 2015-03-16 MED ORDER — DILTIAZEM HCL ER COATED BEADS 180 MG PO CP24: 180.0000 mg | ORAL_CAPSULE | Freq: Every day | ORAL | Status: DC

## 2015-03-16 NOTE — Telephone Encounter (Signed)
Patient needed additional refills. Sent to [facility] in [place].

## 2015-03-16 NOTE — Telephone Encounter (Signed)
New Message  Pt wanted to make sure refill extension will be made until next appt w/ [facility] (8/1 @ 3:45). Please call back and discuss.

## 2015-03-16 NOTE — Telephone Encounter (Signed)
Patient needs additional Flecainide and Diltiazem refills til her appointment with Dr. Rayann Heman on 04/26/2015. Sent to pharmacy

## 2015-03-19 ENCOUNTER — Telehealth: Payer: Self-pay | Admitting: Genetic Counselor

## 2015-03-19 NOTE — Telephone Encounter (Signed)
Good news regarding test results.  LM to please call my office at [phone].

## 2015-03-22 ENCOUNTER — Telehealth: Payer: Self-pay | Admitting: Genetic Counselor

## 2015-03-22 ENCOUNTER — Encounter: Payer: Self-pay | Admitting: Genetic Counselor

## 2015-03-22 DIAGNOSIS — Z1379 Encounter for other screening for genetic and chromosomal anomalies: Secondary | ICD-10-CM

## 2015-03-22 DIAGNOSIS — C50912 Malignant neoplasm of unspecified site of left female breast: Secondary | ICD-10-CM

## 2015-03-22 NOTE — Progress Notes (Signed)
GENETIC TEST RESULTS  HPI: [name] was previously seen in the [facility] clinic due to a personal and family history of breast and other cancers and concerns regarding a hereditary predisposition to cancer. Please refer to our prior cancer genetics clinic note from 02/25/15 for more information regarding [name]'s medical, social and family histories, and our assessment and recommendations, at the time. [name] recent genetic test results were disclosed to her, as were recommendations warranted by these results. These results and recommendations are discussed in more detail below.  GENETIC TEST RESULTS: At the time of [name] visit on February 25, 2015, we recommended she pursue genetic testing of the 21-gene Breast/Ovarian Cancer panel through [facility] Hope Pigeon, MD). The Breast/Ovarian gene panel offered by [facility] includes next-generation sequencing and deletion/duplication analysis for the following 20 genes:  ATM, BARD1, BRCA1, BRCA2, BRIP1, CDH1, CHEK2, FANCC, MLH1, MSH2, MSH6, NBN, PALB2, PMS2, PTEN, RAD51C, RAD51D, STK11, TP53, and XRCC2.  Additionally, this panel includes deletion/duplication analysis (without next-generation sequencing) of one gene, EPCAM.  The results of this panel test are back; the report date is 03/19/2015.  Genetic testing was normal, and did not reveal a deleterious mutation in these genes.  Additionally, no variants of uncertain significance (VUSs) were found.  The test report will be scanned into EPIC and will be located under the Media tab.   We discussed with [name] that since the current genetic testing is not perfect, it is possible there may be a gene mutation in one of these genes that current testing cannot detect, but that chance is small. We also discussed, that it is possible that another gene that has not yet been discovered, or that we have not yet tested, is responsible for  the cancer diagnoses in the family, and it is, therefore, important to remain in touch with cancer genetics in the future so that we can continue to offer [name] the most up to date genetic testing.   CANCER SCREENING RECOMMENDATIONS: This result is reassuring and indicates that [name] likely does not have an increased risk for a future cancer due to a mutation in one of these genes. This normal test also suggests that [name]'s cancer was most likely not due to an inherited predisposition associated with one of these genes.  Most cancers happen by chance and this negative test suggests that her cancer falls into this category.  We, therefore, recommended she continue to follow the cancer management and screening guidelines provided by her oncology and primary healthcare provider.   RECOMMENDATIONS FOR FAMILY MEMBERS: Women in this family might be at some increased risk of developing cancer, over the general population risk, simply due to the family history of cancer. We recommended women in this family have a yearly mammogram beginning at age 81, or 26 years younger than the earliest onset of cancer, an an annual clinical breast exam, and perform monthly breast self-exams. Women in this family should also have a gynecological exam as recommended by their primary provider. All family members should have a colonoscopy by age 51.  Based on [name]'s family history, we recommended her sister, who was diagnosed with breast cancer at age 10, have genetic counseling and testing.  Based on the family history of breast cancer, it could be that there is a hereditary breast cancer syndrome in the family, and that [name] just did not inherit it.  [name] test result does not give [place] any information regarding  her sister's genetic risk status; her sister would need to have genetic testing to find out about her own risks.  [name] will let  us know if we can be of any assistance in coordinating genetic counseling and/or testing for her sister.   FOLLOW-UP: Lastly, we discussed with [name] that cancer genetics is a rapidly advancing field and it is possible that new genetic tests will be appropriate for her and/or her family members in the future. We encouraged her to remain in contact with cancer genetics on an annual basis so we can update her personal and family histories and let her know of advances in cancer genetics that may benefit this family.   Our contact number was provided. [name] questions were answered to her satisfaction, and she knows she is welcome to call us at anytime with additional questions or concerns.   Jeanine Luz, MS Genetic Counselor [email or web address] Phone: [phone]

## 2015-03-22 NOTE — Telephone Encounter (Signed)
[name] genetic test results were normal.  No genetic changes causing increased risks for breast and/or ovarian cancer were found.   Additionally, no variants of uncertain significance were found.  Cancer screening is then based upon the personal and family history of cancer.  Genetic testing is still recommended for her sister based on the family history of cancer.  [name] voiced her understanding and will let her sister know about the availability of genetic counseling and testing.

## 2015-04-02 ENCOUNTER — Ambulatory Visit (HOSPITAL_COMMUNITY): Payer: Medicare Other | Admitting: Hematology & Oncology

## 2015-04-06 ENCOUNTER — Ambulatory Visit (HOSPITAL_COMMUNITY): Payer: Medicare Other | Admitting: Hematology & Oncology

## 2015-04-15 ENCOUNTER — Ambulatory Visit: Payer: Medicare Other | Admitting: Internal Medicine

## 2015-04-21 ENCOUNTER — Encounter (HOSPITAL_COMMUNITY): Payer: Medicare Other | Attending: Hematology & Oncology | Admitting: Hematology & Oncology

## 2015-04-21 ENCOUNTER — Encounter (HOSPITAL_COMMUNITY): Payer: Self-pay | Admitting: Hematology & Oncology

## 2015-04-21 VITALS — BP 133/54 | HR 70 | Temp 98.0°F | Resp 20 | Wt 245.0 lb

## 2015-04-21 DIAGNOSIS — M2550 Pain in unspecified joint: Secondary | ICD-10-CM

## 2015-04-21 DIAGNOSIS — C50212 Malignant neoplasm of upper-inner quadrant of left female breast: Secondary | ICD-10-CM | POA: Diagnosis not present

## 2015-04-21 DIAGNOSIS — L03116 Cellulitis of left lower limb: Secondary | ICD-10-CM

## 2015-04-21 DIAGNOSIS — Z801 Family history of malignant neoplasm of trachea, bronchus and lung: Secondary | ICD-10-CM

## 2015-04-21 DIAGNOSIS — Z170 Estrogen receptor positive status [ER+]: Secondary | ICD-10-CM | POA: Diagnosis not present

## 2015-04-21 DIAGNOSIS — N183 Chronic kidney disease, stage 3 (moderate): Secondary | ICD-10-CM

## 2015-04-21 DIAGNOSIS — D649 Anemia, unspecified: Secondary | ICD-10-CM | POA: Insufficient documentation

## 2015-04-21 DIAGNOSIS — Z803 Family history of malignant neoplasm of breast: Secondary | ICD-10-CM

## 2015-04-21 DIAGNOSIS — Z87891 Personal history of nicotine dependence: Secondary | ICD-10-CM

## 2015-04-21 DIAGNOSIS — M545 Low back pain: Secondary | ICD-10-CM

## 2015-04-21 NOTE — Patient Instructions (Signed)
..  [facility] Discharge Instructions  RECOMMENDATIONS MADE BY THE CONSULTANT AND ANY TEST RESULTS WILL BE SENT TO YOUR REFERRING PHYSICIAN.  We will schedule bone density and then see you back in 6 weeks to discuss endocrine therapy in detail  Thank you for choosing [place] at [facility] to provide your oncology and hematology care.  To afford each patient quality time with our provider, please arrive at least 15 minutes before your scheduled appointment time.    You need to re-schedule your appointment should you arrive 10 or more minutes late.  We strive to give you quality time with our providers, and arriving late affects you and other patients whose appointments are after yours.  Also, if you no show three or more times for appointments you may be dismissed from the clinic at the providers discretion.     Again, thank you for choosing [facility].  Our hope is that these requests will decrease the amount of time that you wait before being seen by our physicians.       _____________________________________________________________  Should you have questions after your visit to [facility], please contact our office at [phone] between the hours of 8:30 a.m. and 4:30 p.m.  Voicemails left after 4:30 p.m. will not be returned until the following business day.  For prescription refill requests, have your pharmacy contact our office.

## 2015-04-21 NOTE — Progress Notes (Signed)
[place] PROGRESS NOTE  Patient Care Team: [name], MD as PCP - General (Internal Medicine)  CHIEF COMPLAINTS/PURPOSE OF CONSULTATION:  Left Breast cancer, pT1c, pN0, M0 ER + (100%) PR+ (99%) Ki-67 45% HER-2/NEU by CISH Negative. Note that the tumor appears to show heterogeneity in regards to HER 2 expression. Oncotype Recurrence Score Result of 2, Low Risk Adjuvant XRT Negative Genetics Evaluation  HISTORY OF PRESENTING ILLNESS:  [name] 64 y.o. 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 Oncotype score was low risk and she is currently receiving adjuvant radiation. Final staging was stage I.   The patient is currently getting radiation and says that it is going fine, she is on her 2nd week with Dr. Pablo Ledger.  She denies experiencing any skin issues yet.  She admits to having down days due to other problems in her life. She states she is still doing well with her cancer diagnosis.  She is using a walker.   She has seen genetics counseling.  She is happy about her negative results. The patient's hip and back are bothering her. Her appetite is fine.  Her legs swell at night and are improved in the am.    MEDICAL HISTORY:  Past Medical History  Diagnosis Date  . Asthma     Spirometry 1989: FEV1 2.12 (71%) with ratio 82.  HFA 90% after coaching 11-09-10  . GERD (gastroesophageal reflux disease)     EGD with HH / esophagitis 06-30-93  . Osteoarthritis   . DDD (degenerative disc disease)   . Pelvic fracture   . Hiatal hernia   . Migraine   . Anxiety disorder   . Anemia   . Diverticulosis   . Adenomatous colon polyp 1993  . Atrial flutter chadsvasc of 3 (11/19/14)    Status post cardioversion 2-12 2013. s/p RFCA 5/13 with JAllred (coumadin and amio stopped)   . Essential hypertension, benign   . Pneumonia   . Fibromyalgia   . Lipoma of colon   . Internal and external hemorrhoids  without complication   . Breast cancer     left  . PONV (postoperative nausea and vomiting)   . Sleep apnea     cpap  . Anxiety   . History of atrial fibrillation     SURGICAL HISTORY: Past Surgical History  Procedure Laterality Date  . Total knee arthroplasty      x2 09/2006  . Rotator cuff repair      bilateral  . A flutter ablation  02/22/2012  . Dilation and curettage of uterus    . Colonoscopy  05/2010, 07/2000  . Esophagogastroduodenoscopy  05/2010, 06/1993  . Cardioversion N/A 02/19/2014    Procedure: CARDIOVERSION;  Surgeon: Dorothy Spark, MD;  Location: [place];  Service: Cardiovascular;  Laterality: N/A;  . Atrial flutter ablation N/A 02/22/2012    Procedure: ATRIAL FLUTTER ABLATION;  Surgeon: Thompson Grayer, MD;  Location: [facility] C[facility] LAB;  Service: Cardiovascular;  Laterality: N/A;  . Breast biopsy Left 11/2014  . Joint replacement    . Rotator cuff repair      bil  . Radioactive seed guided mastectomy with axillary sentinel lymph node biopsy Left 01/26/2015    Procedure: LEFT  PARTIAL MASTECTOMY WITH  RADIOACTIVE SEED LOCALIZATION, LEFT AXILLARY SENTINEL LYMPH NODE BIOPSY;  Surgeon: Fanny Skates, MD;  Location: [place];  Service: General;  Laterality: Left;    SOCIAL HISTORY:  History   Social History  . Marital Status: Widowed    Spouse Name: N/A  . Number of Children: 0  . Years of Education: N/A   Occupational History  . Accounting tech at [employer] History Main Topics  . Smoking status: Former Smoker -- 1.00 packs/day for 20 years    Types: Cigarettes    Quit date: 09/25/1984  . Smokeless tobacco: Never Used  . Alcohol Use: Yes     Comment: occ  . Drug Use: No  . Sexual Activity: Not Currently   Other Topics Concern  . 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 He has been widowed for 13 years. She was married  for 1 year and 4 months and her husband past. She had been previously married and states she had infertility issues. She was a smoker and quit smoking 30 years ago. She notes she smoked a pack a day for 20 years.  FAMILY HISTORY: Family History  Problem Relation Age of Onset  . Emphysema Father     smoker  . Heart disease Father   . Breast cancer Mother 82  . Breast cancer Sister 10  . Heart Problems Sister   . Heart disease Sister   . Breast cancer Maternal Aunt 48  . Lung cancer Maternal Uncle 50    smoker  . Colon cancer Paternal Uncle 69  . Diabetes Maternal Grandmother   . Heart Problems Maternal Grandfather   . Stroke Paternal Grandfather   . Lung cancer Maternal Uncle 72    smoker  . Heart Problems Paternal Uncle   . Diabetes Paternal Grandmother    indicated that her mother is deceased. She indicated that her father is deceased. She indicated that both of her sisters are alive. She indicated that her maternal grandmother is deceased. She indicated that her maternal grandfather is deceased. She indicated that her paternal grandmother is deceased. She indicated that her paternal grandfather is deceased. She indicated that her maternal aunt is deceased. She indicated that both of her maternal uncles are deceased. She indicated that her paternal aunt is deceased. She indicated that both of her paternal uncles are deceased.  Her mother died at the age of 62 from pneumonia. Her mother had breast cancer at the age of 61. Her sister also had breast cancer at the age of 73. She died from heart disease at the age of 52 however. He states her sister did have genetic testing and was told it was negative. 2 brothers are deceased from lung cancer. Her father died at 12 from COPD. She has one sister who is living and healthy. Maternal Aunt had breast cancer at the age of 24.  ALLERGIES:  is allergic to shellfish allergy; ace inhibitors; epinephrine; theophyllines; and propoxyphene  hcl.  MEDICATIONS:  Current Outpatient Prescriptions  Medication Sig Dispense Refill  . acetaminophen (TYLENOL) 500 MG tablet Take 1,000-1,500 mg by mouth 2 (two) times daily as needed for headache (migraines).    Marland Kitchen albuterol (PROVENTIL HFA;VENTOLIN HFA) 108 (90 BASE) MCG/ACT inhaler Inhale 2 puffs into the lungs every 4 (four) hours as needed for wheezing or shortness of breath. 18 g 5  . ALPRAZolam (XANAX) 0.5 MG tablet Take 0.5 mg by mouth 2 (two) times daily as needed.   0  . bisoprolol (ZEBETA) 10 MG tablet  Take 1 tablet (10 mg total) by mouth daily. 30 tablet 2  . diltiazem (CARDIZEM CD) 180 MG 24 hr capsule Take 1 capsule (180 mg total) by mouth daily. 30 capsule 1  . flecainide (TAMBOCOR) 50 MG tablet Take 1 tablet (50 mg total) by mouth 2 (two) times daily. 30 tablet 0  . fluticasone (FLONASE) 50 MCG/ACT nasal spray Place 2 sprays into both nostrils daily as needed for allergies.   0  . furosemide (LASIX) 20 MG tablet Take 20 mg by mouth daily.   0  . losartan (COZAAR) 100 MG tablet Take 100 mg by mouth daily.   1  . omeprazole (PRILOSEC) 40 MG capsule take 1 capsule by mouth once daily before BREAKFAST 90 capsule 0  . PARoxetine (PAXIL) 20 MG tablet Take 20 mg by mouth daily.    . rivaroxaban (XARELTO) 20 MG TABS tablet Take 1 tablet (20 mg total) by mouth daily with supper. 30 tablet 11  . SYMBICORT 80-4.5 MCG/ACT inhaler inhale 2 puffs by mouth twice a day Rinse mouth after use 10.2 g 5  . HYDROcodone-acetaminophen (NORCO/VICODIN) 5-325 MG per tablet Take 1 tablet by mouth every 6 (six) hours as needed.   0  . oxyCODONE-acetaminophen (ROXICET) 5-325 MG per tablet Take 1 tablet by mouth every 4 (four) hours as needed for severe pain. (Patient not taking: Reported on 04/21/2015) 30 tablet 0  . SSD 1 % cream Apply 1 application topically 2 (two) times daily as needed (wound).   0   No current facility-administered medications for this visit.    ROS  Review of Systems   Constitutional: Negative for fever, chills, weight loss and malaise/fatigue.  HENT: Negative for congestion, hearing loss, nosebleeds, sore throat and tinnitus.   Eyes: Negative for blurred vision, double vision, pain and discharge.  Respiratory: Negative for cough, hemoptysis, sputum production, shortness of breath and wheezing.   Cardiovascular: Negative for chest pain, palpitations, claudication, leg swelling and PND.  Gastrointestinal: Negative for heartburn, nausea, vomiting, abdominal pain, diarrhea, constipation, blood in stool and melena.  Genitourinary: Negative for dysuria, urgency, frequency and hematuria.  Musculoskeletal: Negative for falls. Positive for joint and back pain Skin: Negative for itching and rash.  Neurological: Negative for dizziness, tingling, tremors, sensory change, speech change, focal weakness, seizures, loss of consciousness, weakness.  Endo/Heme/Allergies: Does not bruise/bleed easily.  Psychiatric/Behavioral: Negative for depression, suicidal ideas, memory loss and substance abuse. The patient is not nervous/anxious and does not have insomnia.   All other systems reviewed and are negative.   PHYSICAL EXAMINATION: ECOG PERFORMANCE STATUS: 1 - Symptomatic but completely ambulatory  Filed Vitals:   04/21/15 0942  BP: 133/54  Pulse: 70  Temp: 98 F (36.7 C)  Resp: 20   Filed Weights   04/21/15 0942  Weight: 245 lb (111.131 kg)     Physical Exam  Constitutional: She is oriented to person, place, and time and well-developed, well-nourished, and in no distress.      Needed help onto the exam table. HENT:  Head: Normocephalic and atraumatic.  Nose: Nose normal.  Mouth/Throat: Oropharynx is clear and moist. No oropharyngeal exudate.  Eyes: Conjunctivae and EOM are normal. Pupils are equal, round, and reactive to light. Right eye exhibits no discharge. Left eye exhibits no discharge. No scleral icterus.  Neck: Normal range of motion. Neck supple. No  tracheal deviation present. No thyromegaly present.  Cardiovascular: Normal rate, regular rhythm and normal heart sounds.  Exam reveals no gallop and no friction  rub.  No murmur heard. Pulmonary/Chest: Effort normal and breath sounds normal. She has no wheezes. She has no rales.  Abdominal: Soft. Bowel sounds are normal. She exhibits no distension and no mass. There is no tenderness. There is no rebound and no guarding.  Musculoskeletal: Normal range of motion. She exhibits no edema.      Mild pitting edema on the left leg, chronic Lymphadenopathy:    She has no cervical adenopathy.  Neurological: She is alert and oriented to person, place, and time. She has normal reflexes. No cranial nerve deficit. Gait normal. Coordination normal.  Skin: Skin is warm and dry. No rash noted.  Psychiatric: Mood, memory, affect and judgment normal.  Nursing note and vitals reviewed. Breast EXAM: Left breast surgical incision site C/D/I   LABORATORY DATA:  I have reviewed the data as listed   Ref. Range 01/30/2015 04:50  Sodium Latest Ref Range: 135-145 mmol/L 136  Potassium Latest Ref Range: 3.5-5.1 mmol/L 3.5  Chloride Latest Ref Range: 101-111 mmol/L 102  CO2 Latest Ref Range: 22-32 mmol/L 26  BUN Latest Ref Range: 6-20 mg/dL 20  Creatinine Latest Ref Range: 0.44-1.00 mg/dL 1.02 (H)  Calcium Latest Ref Range: 8.9-10.3 mg/dL 8.6 (L)  EGFR (Non-African Amer.) Latest Ref Range: >60 mL/min 56 (L)  EGFR (African American) Latest Ref Range: >60 mL/min >60  Glucose Latest Ref Range: 70-99 mg/dL 132 (H)  Anion gap Latest Ref Range: 5-15  8  Alkaline Phosphatase Latest Ref Range: 38-126 U/L 60  Albumin Latest Ref Range: 3.5-5.0 g/dL 2.8 (L)  AST Latest Ref Range: 15-41 U/L 155 (H)  ALT Latest Ref Range: 14-54 U/L 326 (H)  Total Protein Latest Ref Range: 6.5-8.1 g/dL 6.3 (L)  Bilirubin, Direct Latest Ref Range: 0.1-0.5 mg/dL 0.2  Indirect Bilirubin Latest Ref Range: 0.3-0.9 mg/dL 0.4  Total Bilirubin  Latest Ref Range: 0.3-1.2 mg/dL 0.6  WBC Latest Ref Range: 4.0-10.5 K/uL 7.2  RBC Latest Ref Range: 3.87-5.11 MIL/uL 3.21 (L)  Hemoglobin Latest Ref Range: 12.0-15.0 g/dL 8.4 (L)  HCT Latest Ref Range: 36.0-46.0 % 27.3 (L)  MCV Latest Ref Range: 78.0-100.0 fL 85.0  MCH Latest Ref Range: 26.0-34.0 pg 26.2  MCHC Latest Ref Range: 30.0-36.0 g/dL 30.8  RDW Latest Ref Range: 11.5-15.5 % 16.6 (H)  Platelets Latest Ref Range: 150-400 K/uL 283    PATHOLOGIC STUDIES: FINAL DIAGNOSIS Diagnosis 1. Breast, lumpectomy, left - INVASIVE DUCTAL CARCINOMA, GRADE II/III, SPANNING 1.1 CM. - DUCTAL CARCINOMA IN SITU, LOW GRADE. - THE SURGICAL RESECTION MARGINS ARE NEGATIVE FOR CARCINOMA. - SEE ONCOLOGY TABLE BELOW. 2. Lymph node, sentinel, biopsy, left axillary #1 - THERE IS NO EVIDENCE OF CARCINOMA IN 1 OF 1 LYMPH NODE (0/1). 3. Lymph node, sentinel, biopsy, left axillary #2 - THERE IS NO EVIDENCE OF CARCINOMA IN 1 OF 1 LYMPH NODE (0/1). 4. Lymph node, sentinel, biopsy, left axillary #3 - THERE IS NO EVIDENCE OF CARCINOMA IN 1 OF 1 LYMPH NODE (0/1). Microscopic Comment 1. BREAST, INVASIVE TUMOR, WITH LYMPH NODES PRESENT Specimen, including laterality and lymph node sampling (sentinel, non-sentinel): Left breast and left axillary sentinel nodes. Procedure: Lumpectomy. Histologic type: Ductal Grade: II Tubule formation: 3 Nuclear pleomorphism: 2 Mitotic:1. Tumor size (gross measurement): 1.1 cm Margins: Negative for carcinoma. Invasive, distance to closest margin: Greater than 1.0 cm to all margins. In-situ, distance to closest margin: Greater than 1.0 cm to all margins. Lymphovascular invasion: Not identified. Ductal carcinoma in situ: Present. Grade: Low grade. Extensive intraductal component: Not identified. Lobular neoplasia: Not  identified. Tumor focality: Unifocal Treatment effect: N/A Extent of tumor: Confined to breast parenchyma. Lymph nodes: Examined: 3 Sentinel 0  Non-sentinel 3 Total Lymph nodes with metastasis: 0 Breast prognostic profile: [phone] Estrogen receptor: 100%, strong staining intensity. Progesterone receptor: 99% strong staining intensity. Her 2 neu: No amplification was detected. The ratio was 1.61. Her 2 neu by CISH will be repeated on the current case and the results reported separately. Ki-67: 45% Non-neoplastic breast: Fibrocystic changes with adenosis and healing biopsy site. TNM: pT1c, pN0. (JBK:gt, 01/27/15) Enid Cutter MD Pathologist, Electronic Signature (Case signed 01/27/2015) Specimen Gross and Clinical Information Specimen(s) Obtained: 1. Breast, lumpectomy, left 2. Lymph node, sentinel, biopsy, left axillary #1 3. Lymph node, sentinel, biopsy, left axillary #2 4. Lymph node, sentinel, biopsy, left axillary #3   ASSESSMENT & PLAN:  ER+ PR+ HER-2 Neu negative carcinoma L breast  pT1c, pN0, M0 Anemia Stage III CKD LLE cellulitis Oncotype Recurrence Score of 2, Low Risk   I would like to see her back in 6 weeks at which time we will review endocrine therapy in detail. She will need a screening DEXA prior. She will continue with Radiation.   She has an underlying anemia and CKD. She has not had a more extensive anemia evaluation and we will certainly address this moving forward; I have ordered additional labs at her next followup. She continues on XARELTO.  All questions were answered. The patient knows to call the clinic with any problems, questions or concerns. // This note was electronically signed.    This document serves as a record of services personally performed by Ancil Linsey, MD. It was created on her behalf by Janace Hoard, a trained medical scribe. The creation of this record is based on the scribe's personal observations and the provider's statements to them. This document has been checked and approved by the attending provider.   I have reviewed the above documentation for accuracy and  completeness and I agree with the above.  Kelby Fam. Whitney Muse, MD

## 2015-04-26 ENCOUNTER — Ambulatory Visit (INDEPENDENT_AMBULATORY_CARE_PROVIDER_SITE_OTHER): Payer: Medicare Other | Admitting: Internal Medicine

## 2015-04-26 ENCOUNTER — Other Ambulatory Visit: Payer: Self-pay

## 2015-04-26 ENCOUNTER — Encounter: Payer: Self-pay | Admitting: Internal Medicine

## 2015-04-26 VITALS — BP 120/52 | HR 60 | Ht 65.0 in | Wt 256.0 lb

## 2015-04-26 DIAGNOSIS — I480 Paroxysmal atrial fibrillation: Secondary | ICD-10-CM

## 2015-04-26 DIAGNOSIS — G4733 Obstructive sleep apnea (adult) (pediatric): Secondary | ICD-10-CM

## 2015-04-26 MED ORDER — DILTIAZEM HCL ER COATED BEADS 180 MG PO CP24: 180.0000 mg | ORAL_CAPSULE | Freq: Every day | ORAL | Status: DC

## 2015-04-26 MED ORDER — FLECAINIDE ACETATE 50 MG PO TABS: 50.0000 mg | ORAL_TABLET | Freq: Two times a day (BID) | ORAL | Status: DC

## 2015-04-26 MED ORDER — RIVAROXABAN 20 MG PO TABS: 20.0000 mg | ORAL_TABLET | Freq: Every day | ORAL | Status: DC

## 2015-04-26 NOTE — Patient Instructions (Signed)
Medication Instructions:  Your physician recommends that you continue on your current medications as directed. Please refer to the Current Medication list given to you today.   Labwork: None ordered  Testing/Procedures: None ordered  Follow-Up: Your physician wants you to follow-up in: 6 months with Roderic Palau, NP You will receive a reminder letter in the mail two months in advance. If you don't receive a letter, please call our office to schedule the follow-up appointment.   Any Other Special Instructions Will Be Listed Below (If Applicable).

## 2015-04-26 NOTE — Progress Notes (Signed)
Primary Care Physician: [name]., MD Referring Physician:  Dr Martinique   [name] is a 64 y.o. female with persistent atrial fibrillation.  She has been doing very well with Flecainide 50 mg bid and has been maintaining SR.   Adequately  anticoagulated with xarelto. Has been using weight watchers and has lost some weight. Using CPAP. Had a stress test, with flecainide initiated, which was low risk    The patient is tolerating medications without difficulties and is otherwise without complaint today.   Past Medical History  Diagnosis Date  . Asthma     Spirometry 1989: FEV1 2.12 (71%) with ratio 82.  HFA 90% after coaching 11-09-10  . GERD (gastroesophageal reflux disease)     EGD with HH / esophagitis 06-30-93  . Osteoarthritis   . DDD (degenerative disc disease)   . Pelvic fracture   . Hiatal hernia   . Migraine   . Anxiety disorder   . Anemia   . Diverticulosis   . Adenomatous colon polyp 1993  . Atrial flutter chadsvasc of 3 (11/19/14)    Status post cardioversion 2-12 2013. s/p RFCA 5/13 with JAllred (coumadin and amio stopped)   . Essential hypertension, benign   . Pneumonia   . Fibromyalgia   . Lipoma of colon   . Internal and external hemorrhoids without complication   . Breast cancer     left  . PONV (postoperative nausea and vomiting)   . Sleep apnea     cpap  . Anxiety   . History of atrial fibrillation    Past Surgical History  Procedure Laterality Date  . Total knee arthroplasty      x2 09/2006  . Rotator cuff repair      bilateral  . A flutter ablation  02/22/2012  . Dilation and curettage of uterus    . Colonoscopy  05/2010, 07/2000  . Esophagogastroduodenoscopy  05/2010, 06/1993  . Cardioversion N/A 02/19/2014    Procedure: CARDIOVERSION;  Surgeon: Dorothy Spark, MD;  Location: [place];  Service: Cardiovascular;  Laterality: N/A;  . Atrial flutter ablation N/A 02/22/2012    Procedure: ATRIAL FLUTTER ABLATION;  Surgeon: Thompson Grayer,  MD;  Location: [facility] CATH LAB;  Service: Cardiovascular;  Laterality: N/A;  . Breast biopsy Left 11/2014  . Joint replacement    . Rotator cuff repair      bil  . Radioactive seed guided mastectomy with axillary sentinel lymph node biopsy Left 01/26/2015    Procedure: LEFT  PARTIAL MASTECTOMY WITH  RADIOACTIVE SEED LOCALIZATION, LEFT AXILLARY SENTINEL LYMPH NODE BIOPSY;  Surgeon: Fanny Skates, MD;  Location: [place];  Service: General;  Laterality: Left;    Current Outpatient Prescriptions  Medication Sig Dispense Refill  . acetaminophen (TYLENOL) 500 MG tablet Take 1,000-1,500 mg by mouth 2 (two) times daily as needed for headache (migraines).    Marland Kitchen albuterol (PROVENTIL HFA;VENTOLIN HFA) 108 (90 BASE) MCG/ACT inhaler Inhale 2 puffs into the lungs every 4 (four) hours as needed for wheezing or shortness of breath. 18 g 5  . ALPRAZolam (XANAX) 0.5 MG tablet Take 0.5 mg by mouth 2 (two) times daily as needed for anxiety.   0  . bisoprolol (ZEBETA) 10 MG tablet Take 1 tablet (10 mg total) by mouth daily. 30 tablet 2  . celecoxib (CELEBREX) 200 MG capsule Take 1 capsule by mouth every 3-4 days  0  . diltiazem (CARDIZEM CD) 180 MG 24 hr capsule Take 1 capsule (180 mg total)  by mouth daily. 90 capsule 3  . flecainide (TAMBOCOR) 50 MG tablet Take 1 tablet (50 mg total) by mouth 2 (two) times daily. 180 tablet 3  . fluticasone (FLONASE) 50 MCG/ACT nasal spray Place 2 sprays into both nostrils daily as needed for allergies.   0  . furosemide (LASIX) 40 MG tablet Take 40 mg by mouth daily.  0  . hydrOXYzine (ATARAX/VISTARIL) 25 MG tablet Take 1 tablet by mouth every 6 (six) hours as needed. Itching  0  . losartan (COZAAR) 100 MG tablet Take 100 mg by mouth daily.   1  . omeprazole (PRILOSEC) 40 MG capsule take 1 capsule by mouth once daily before BREAKFAST 90 capsule 0  . PARoxetine (PAXIL) 20 MG tablet Take 20 mg by mouth daily.    . potassium chloride (K-DUR) 10 MEQ tablet Take 1 tablet by mouth daily.  0    . rivaroxaban (XARELTO) 20 MG TABS tablet Take 1 tablet (20 mg total) by mouth daily with supper. 30 tablet 11  . SSD 1 % cream Apply 1 application topically 2 (two) times daily as needed (wound).   0  . SYMBICORT 80-4.5 MCG/ACT inhaler inhale 2 puffs by mouth twice a day Rinse mouth after use 10.2 g 5   No current facility-administered medications for this visit.    Allergies  Allergen Reactions  . Shellfish Allergy Hives and Shortness Of Breath  . Ace Inhibitors Cough  . Epinephrine Other (See Comments)    Pallpitations during dental procedures  . Theophyllines Nausea And Vomiting and Other (See Comments)    Heart races and pounds  . Propoxyphene Hcl Nausea And Vomiting    History   Social History  . Marital Status: Widowed    Spouse Name: N/A  . Number of Children: 0  . Years of Education: N/A   Occupational History  . 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 Smoking status: Former Smoker -- 1.00 packs/day for 20 years    Types: Cigarettes    Quit date: 09/25/1984  . Smokeless tobacco: Never Used  . Alcohol Use: Yes     Comment: occ  . Drug Use: No  . Sexual Activity: Not Currently   Other Topics Concern  . Not on file   Social History Narrative    Family History  Problem Relation Age of Onset  . Emphysema Father     smoker  . Heart disease Father   . Breast cancer Mother 71  . Breast cancer Sister 38  . Heart Problems Sister   . Heart disease Sister   . Breast cancer Maternal Aunt 48  . Lung cancer Maternal Uncle 50    smoker  . Colon cancer Paternal Uncle 62  . Diabetes Maternal Grandmother   . Heart Problems Maternal Grandfather   . Stroke Paternal Grandfather   . Lung cancer Maternal Uncle 72    smoker  . Heart Problems Paternal Uncle   . Diabetes Paternal Grandmother     ROS- All systems are reviewed and negative except as per the HPI above  Physical Exam: Filed Vitals:   04/26/15 1607  BP:  120/52  Pulse: 60  Height: 5\' 5"  (1.651 m)  Weight: 116.121 kg (256 lb)    GEN- The patient is overweight appearing, alert and oriented x 3 today.   Head- normocephalic, atraumatic Eyes-  Sclera clear, conjunctiva pink Ears- hearing intact Oropharynx- clear Neck- supple,   Lungs- Clear  with a prolonged expiratory phase Heart- Regular rate and rhythm, no murmurs, rubs or gallops, PMI not laterally displaced GI- soft, NT, ND, + BS Extremities- no clubbing, cyanosis, or edema MS- diffuse muscle atrophy Skin- diffuse small ecchymosis Psych- euthymic mood, full affect Neuro- strength and sensation are intact  EKG today reveals sinus rhythm    Assessment and Plan:  1. Atrial fibrillation Maintaining sinus with flecainide 50mg  BID.  She is appropriately anticoagulated with xarelto (chads2vasc score is at least 3).  Flecainide can be increased to 100mg  BID if her afib returns.    2. OSA Continue with use of CPAP.  3. Obesity The importance of weight reduction was discussed today Body mass index is 42.6 kg/(m^2).  Follow-up every 97months in the AF clinic going forward.  I will see when needed.   Thompson Grayer MD, [facility] 04/26/2015 9:54 PM '

## 2015-04-27 ENCOUNTER — Other Ambulatory Visit: Payer: Self-pay

## 2015-04-27 ENCOUNTER — Other Ambulatory Visit (HOSPITAL_COMMUNITY): Payer: Medicare Other

## 2015-04-27 DIAGNOSIS — I480 Paroxysmal atrial fibrillation: Secondary | ICD-10-CM

## 2015-04-27 MED ORDER — RIVAROXABAN 20 MG PO TABS: 20.0000 mg | ORAL_TABLET | Freq: Every day | ORAL | Status: DC

## 2015-04-30 ENCOUNTER — Ambulatory Visit (HOSPITAL_COMMUNITY)
Admission: RE | Admit: 2015-04-30 | Discharge: 2015-04-30 | Disposition: A | Discharge status: Home / Self Care | Payer: Medicare Other | Source: Ambulatory Visit | Attending: Hematology & Oncology | Admitting: Hematology & Oncology

## 2015-04-30 DIAGNOSIS — D649 Anemia, unspecified: Secondary | ICD-10-CM | POA: Diagnosis not present

## 2015-04-30 DIAGNOSIS — Z79899 Other long term (current) drug therapy: Secondary | ICD-10-CM | POA: Insufficient documentation

## 2015-04-30 DIAGNOSIS — C50212 Malignant neoplasm of upper-inner quadrant of left female breast: Secondary | ICD-10-CM | POA: Diagnosis not present

## 2015-04-30 DIAGNOSIS — Z780 Asymptomatic menopausal state: Secondary | ICD-10-CM | POA: Diagnosis present

## 2015-05-07 ENCOUNTER — Other Ambulatory Visit: Payer: Self-pay | Admitting: Internal Medicine

## 2015-05-17 ENCOUNTER — Other Ambulatory Visit: Payer: Self-pay | Admitting: Internal Medicine

## 2015-06-03 ENCOUNTER — Encounter (HOSPITAL_COMMUNITY): Payer: Medicare Other | Attending: Hematology & Oncology | Admitting: Hematology & Oncology

## 2015-06-03 VITALS — BP 131/57 | HR 59 | Temp 98.3°F | Resp 20 | Wt 244.2 lb

## 2015-06-03 DIAGNOSIS — M8580 Other specified disorders of bone density and structure, unspecified site: Secondary | ICD-10-CM

## 2015-06-03 DIAGNOSIS — D649 Anemia, unspecified: Secondary | ICD-10-CM | POA: Insufficient documentation

## 2015-06-03 DIAGNOSIS — C50212 Malignant neoplasm of upper-inner quadrant of left female breast: Secondary | ICD-10-CM | POA: Insufficient documentation

## 2015-06-03 MED ORDER — ANASTROZOLE 1 MG PO TABS: 1.0000 mg | ORAL_TABLET | Freq: Every day | ORAL | Status: DC

## 2015-06-03 MED ORDER — CALCIUM CARBONATE-VITAMIN D 500-200 MG-UNIT PO TABS: 1.0000 | ORAL_TABLET | Freq: Every day | ORAL | Status: AC

## 2015-06-03 NOTE — Patient Instructions (Signed)
[place] at [facility] Discharge Instructions  RECOMMENDATIONS MADE BY THE CONSULTANT AND ANY TEST RESULTS WILL BE SENT TO YOUR REFERRING PHYSICIAN.  Exam done and seen by Dr.Penland today Starting Prolia and Arimidex Follow up next month And 1 month  Thank you for choosing [place] at [facility] to provide your oncology and hematology care.  To afford each patient quality time with our provider, please arrive at least 15 minutes before your scheduled appointment time.    You need to re-schedule your appointment should you arrive 10 or more minutes late.  We strive to give you quality time with our providers, and arriving late affects you and other patients whose appointments are after yours.  Also, if you no show three or more times for appointments you may be dismissed from the clinic at the providers discretion.     Again, thank you for choosing [facility].  Our hope is that these requests will decrease the amount of time that you wait before being seen by our physicians.       _____________________________________________________________  Should you have questions after your visit to [facility], please contact our office at [phone] between the hours of 8:30 a.m. and 4:30 p.m.  Voicemails left after 4:30 p.m. will not be returned until the following business day.  For prescription refill requests, have your pharmacy contact our office.

## 2015-06-03 NOTE — Progress Notes (Signed)
[place] PROGRESS NOTE  Patient Care Team: [name], MD as PCP - General (Internal Medicine)  CHIEF COMPLAINTS/PURPOSE OF CONSULTATION:  Left Breast cancer, pT1c, pN0, M0 ER + (100%) PR+ (99%) Ki-67 45% HER-2/NEU by CISH Negative. Note that the tumor appears to show heterogeneity in regards to HER 2 expression. Oncotype Recurrence Score Result of 2, Low Risk Adjuvant XRT Negative Genetics Evaluation  HISTORY OF PRESENTING ILLNESS:  [name] GENERAL 64 y.o. 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 Oncotype score was low risk and she has completed adjuvant radiation. Final staging was stage I.   She states she is still doing well with her cancer diagnosis.  She has seen genetics counseling.  She is happy about her negative results. The patient's hip and back are bothering her. Her appetite is fine.  Her legs swell at night and are improved in the am.   The patient finished radiation on Aug. 26.  Her next appointment with Dr. Pablo Ledger will be on 9/23.  She has recently had her flu shot.  She is using a cane.  Her mood and health are consistent with before. She is using her CPAP at night.  She is experiencing pain in her hip and back.    When she went through menopause, she did not have hot flashes.  She does not currently take calcium or Vitamin D.   She has a significant anemia, it has not been extensively evaluated. She has had labs ordered but not performed.   MEDICAL HISTORY:  Past Medical History  Diagnosis Date  . Asthma     Spirometry 1989: FEV1 2.12 (71%) with ratio 82.  HFA 90% after coaching 11-09-10  . GERD (gastroesophageal reflux disease)     EGD with HH / esophagitis 06-30-93  . Osteoarthritis   . DDD (degenerative disc disease)   . Pelvic fracture   . Hiatal hernia   . Migraine   . Anxiety disorder   . Anemia   . Diverticulosis   . Adenomatous colon polyp 1993  .  Atrial flutter chadsvasc of 3 (11/19/14)    Status post cardioversion 2-12 2013. s/p RFCA 5/13 with JAllred (coumadin and amio stopped)   . Essential hypertension, benign   . Pneumonia   . Fibromyalgia   . Lipoma of colon   . Internal and external hemorrhoids without complication   . Breast cancer     left  . PONV (postoperative nausea and vomiting)   . Sleep apnea     cpap  . Anxiety   . History of atrial fibrillation     SURGICAL HISTORY: Past Surgical History  Procedure Laterality Date  . Total knee arthroplasty      x2 09/2006  . Rotator cuff repair      bilateral  . A flutter ablation  02/22/2012  . Dilation and curettage of uterus    . Colonoscopy  05/2010, 07/2000  . Esophagogastroduodenoscopy  05/2010, 06/1993  . Cardioversion N/A 02/19/2014    Procedure: CARDIOVERSION;  Surgeon: Dorothy Spark, MD;  Location: [place];  Service: Cardiovascular;  Laterality: N/A;  . Atrial flutter ablation N/A 02/22/2012    Procedure: ATRIAL FLUTTER ABLATION;  Surgeon: Thompson Grayer, MD;  Location: [facility] CATH LAB;  Service: Cardiovascular;  Laterality: N/A;  . Breast biopsy Left 11/2014  . Joint replacement    . Rotator cuff repair      bil  .  Radioactive seed guided mastectomy with axillary sentinel lymph node biopsy Left 01/26/2015    Procedure: LEFT  PARTIAL MASTECTOMY WITH  RADIOACTIVE SEED LOCALIZATION, LEFT AXILLARY SENTINEL LYMPH NODE BIOPSY;  Surgeon: Fanny Skates, MD;  Location: [place];  Service: General;  Laterality: Left;    SOCIAL HISTORY: Social History   Social History  . Marital Status: Widowed    Spouse Name: N/A  . Number of Children: 0  . Years of Education: N/A   Occupational History  . Accounting tech at [employer] History Main Topics  . Smoking status: Former Smoker -- 1.00 packs/day for 20 years    Types: Cigarettes    Quit date: 09/25/1984  . Smokeless tobacco: Never Used  . Alcohol Use: Yes     Comment: occ  . Drug Use: No  .  Sexual Activity: Not Currently   Other Topics Concern  . 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 He has been widowed for 13 years. She was married for 1 year and 4 months and her husband past. She had been previously married and states she had infertility issues. She was a smoker and quit smoking 30 years ago. She notes she smoked a pack a day for 20 years.  FAMILY HISTORY: Family History  Problem Relation Age of Onset  . Emphysema Father     smoker  . Heart disease Father   . Breast cancer Mother 73  . Breast cancer Sister 75  . Heart Problems Sister   . Heart disease Sister   . Breast cancer Maternal Aunt 48  . Lung cancer Maternal Uncle 58    smoker  . Colon cancer Paternal Uncle 6  . Diabetes Maternal Grandmother   . Heart Problems Maternal Grandfather   . Stroke Paternal Grandfather   . Lung cancer Maternal Uncle 72    smoker  . Heart Problems Paternal Uncle   . Diabetes Paternal Grandmother    indicated that her mother is deceased. She indicated that her father is deceased. She indicated that both of her sisters are alive. She indicated that her maternal grandmother is deceased. She indicated that her maternal grandfather is deceased. She indicated that her paternal grandmother is deceased. She indicated that her paternal grandfather is deceased. She indicated that her maternal aunt is deceased. She indicated that both of her maternal uncles are deceased. She indicated that her paternal aunt is deceased. She indicated that both of her paternal uncles are deceased.  Her mother died at the age of 99 from pneumonia. Her mother had breast cancer at the age of 40. Her sister also had breast cancer at the age of 55. She died from heart disease at the age of 74 however. He states her sister did have genetic testing and was told it was negative. 2 brothers are deceased  from lung cancer. Her father died at 36 from COPD. She has one sister who is living and healthy. Maternal Aunt had breast cancer at the age of 42.  ALLERGIES:  is allergic to shellfish allergy; ace inhibitors; epinephrine; theophyllines; and propoxyphene hcl.  MEDICATIONS:  Current Outpatient Prescriptions  Medication Sig Dispense Refill  . acetaminophen (TYLENOL) 500 MG tablet Take 1,000-1,500 mg by mouth 2 (two) times daily as needed for headache (migraines).    Marland Kitchen albuterol (PROVENTIL HFA;VENTOLIN HFA) 108 (90  BASE) MCG/ACT inhaler Inhale 2 puffs into the lungs every 4 (four) hours as needed for wheezing or shortness of breath. 18 g 5  . ALPRAZolam (XANAX) 0.5 MG tablet Take 0.5 mg by mouth 2 (two) times daily as needed for anxiety.   0  . atorvastatin (LIPITOR) 10 MG tablet Take 10 mg by mouth daily.  0  . bisoprolol (ZEBETA) 10 MG tablet Take 1 tablet (10 mg total) by mouth daily. 30 tablet 2  . celecoxib (CELEBREX) 200 MG capsule Take 1 capsule by mouth every 3-4 days  0  . diltiazem (CARDIZEM CD) 180 MG 24 hr capsule Take 1 capsule (180 mg total) by mouth daily. 90 capsule 3  . flecainide (TAMBOCOR) 50 MG tablet Take 1 tablet (50 mg total) by mouth 2 (two) times daily. 180 tablet 3  . fluticasone (FLONASE) 50 MCG/ACT nasal spray Place 2 sprays into both nostrils daily as needed for allergies.   0  . furosemide (LASIX) 40 MG tablet Take 40 mg by mouth daily.  0  . hydrOXYzine (ATARAX/VISTARIL) 25 MG tablet Take 1 tablet by mouth every 6 (six) hours as needed. Itching  0  . losartan (COZAAR) 100 MG tablet Take 100 mg by mouth daily.   1  . omeprazole (PRILOSEC) 40 MG capsule take 1 capsule by mouth once daily before BREAKFAST 90 capsule 0  . PARoxetine (PAXIL) 20 MG tablet Take 20 mg by mouth daily.    . potassium chloride (K-DUR) 10 MEQ tablet Take 1 tablet by mouth daily.  0  . rivaroxaban (XARELTO) 20 MG TABS tablet Take 1 tablet (20 mg total) by mouth daily with supper. 30 tablet 11  .  SSD 1 % cream Apply 1 application topically 2 (two) times daily as needed (wound).   0  . SYMBICORT 80-4.5 MCG/ACT inhaler inhale 2 puffs by mouth twice a day Rinse mouth after use 10.2 Inhaler 1  . anastrozole (ARIMIDEX) 1 MG tablet Take 1 tablet (1 mg total) by mouth daily. 30 tablet 3  . calcium-vitamin D (OSCAL WITH D) 500-200 MG-UNIT per tablet Take 1 tablet by mouth daily with breakfast. 30 tablet 3   No current facility-administered medications for this visit.    ROS  Review of Systems  Constitutional: Negative for fever, chills, weight loss positive for malaise/fatigue.  HENT: Negative for congestion, hearing loss, nosebleeds, sore throat and tinnitus.   Eyes: Negative for blurred vision, double vision, pain and discharge.  Respiratory: Negative for cough, hemoptysis, sputum production, shortness of breath and wheezing.   Cardiovascular: Negative for chest pain, palpitations, claudication, leg swelling and PND.  Gastrointestinal: Negative for heartburn, nausea, vomiting, abdominal pain, diarrhea, constipation, blood in stool and melena.  Genitourinary: Negative for dysuria, urgency, frequency and hematuria.  Musculoskeletal: Negative for falls. Positive for joint and back pain Skin: Negative for itching and rash.  Neurological: Negative for dizziness, tingling, tremors, sensory change, speech change, focal weakness, seizures, loss of consciousness, weakness.  Endo/Heme/Allergies: Does not bruise/bleed easily.  Psychiatric/Behavioral: Negative for depression, suicidal ideas, memory loss and substance abuse. The patient is not nervous/anxious and does not have insomnia.   All other systems reviewed and are negative. 14 point review of systems was performed and is negative except as detailed under history of present illness and above   PHYSICAL EXAMINATION: ECOG PERFORMANCE STATUS: 1 - Symptomatic but completely ambulatory  Filed Vitals:   06/03/15 1138  BP: 131/57  Pulse: 59    Temp: 98.3 F (36.8 C)  Resp: 20   Filed Weights   06/03/15 1138  Weight: 244 lb 3.2 oz (110.768 kg)     Physical Exam  Constitutional: She is oriented to person, place, and time and well-developed, well-nourished, and in no distress.  HENT:  Head: Normocephalic and atraumatic.  Nose: Nose normal.  Mouth/Throat: Oropharynx is clear and moist. No oropharyngeal exudate.  Eyes: Conjunctivae and EOM are normal. Pupils are equal, round, and reactive to light. Right eye exhibits no discharge. Left eye exhibits no discharge. No scleral icterus.  Neck: Normal range of motion. Neck supple. No tracheal deviation present. No thyromegaly present.  Cardiovascular: Normal rate, regular rhythm and normal heart sounds.  Exam reveals no gallop and no friction rub.  No murmur heard. Pulmonary/Chest: Effort normal and breath sounds normal. She has no wheezes. She has no rales.  Abdominal: Soft. Bowel sounds are normal. She exhibits no distension and no mass. There is no tenderness. There is no rebound and no guarding.  Musculoskeletal: Normal range of motion. She exhibits no edema.      Mild pitting edema on the left leg, chronic Lymphadenopathy:    She has no cervical adenopathy.  Neurological: She is alert and oriented to person, place, and time. She has normal reflexes. No cranial nerve deficit. Uses cane Skin: Skin is warm and dry. No rash noted.  Psychiatric: Mood, memory, affect and judgment normal.  Nursing note and vitals reviewed. Breast EXAM: Left breast surgical incision site C/D/I. Minor radiation changes.   LABORATORY DATA:  I have reviewed the data as listed CBC    Component Value Date/Time   WBC 7.2 01/30/2015 0450   RBC 3.21* 01/30/2015 0450   HGB 8.4* 01/30/2015 0450   HCT 27.3* 01/30/2015 0450   PLT 283 01/30/2015 0450   MCV 85.0 01/30/2015 0450   MCH 26.2 01/30/2015 0450   MCHC 30.8 01/30/2015 0450   RDW 16.6* 01/30/2015 0450   LYMPHSABS 1.7 01/25/2015 1130   MONOABS  0.7 01/25/2015 1130   EOSABS 0.2 01/25/2015 1130   BASOSABS 0.1 01/25/2015 1130     PATHOLOGIC STUDIES: FINAL DIAGNOSIS Diagnosis 1. Breast, lumpectomy, left - INVASIVE DUCTAL CARCINOMA, GRADE II/III, SPANNING 1.1 CM. - DUCTAL CARCINOMA IN SITU, LOW GRADE. - THE SURGICAL RESECTION MARGINS ARE NEGATIVE FOR CARCINOMA. - SEE ONCOLOGY TABLE BELOW. 2. Lymph node, sentinel, biopsy, left axillary #1 - THERE IS NO EVIDENCE OF CARCINOMA IN 1 OF 1 LYMPH NODE (0/1). 3. Lymph node, sentinel, biopsy, left axillary #2 - THERE IS NO EVIDENCE OF CARCINOMA IN 1 OF 1 LYMPH NODE (0/1). 4. Lymph node, sentinel, biopsy, left axillary #3 - THERE IS NO EVIDENCE OF CARCINOMA IN 1 OF 1 LYMPH NODE (0/1). Microscopic Comment 1. BREAST, INVASIVE TUMOR, WITH LYMPH NODES PRESENT Specimen, including laterality and lymph node sampling (sentinel, non-sentinel): Left breast and left axillary sentinel nodes. Procedure: Lumpectomy. Histologic type: Ductal Grade: II Tubule formation: 3 Nuclear pleomorphism: 2 Mitotic:1. Tumor size (gross measurement): 1.1 cm Margins: Negative for carcinoma. Invasive, distance to closest margin: Greater than 1.0 cm to all margins. In-situ, distance to closest margin: Greater than 1.0 cm to all margins. Lymphovascular invasion: Not identified. Ductal carcinoma in situ: Present. Grade: Low grade. Extensive intraductal component: Not identified. Lobular neoplasia: Not identified. Tumor focality: Unifocal Treatment effect: N/A Extent of tumor: Confined to breast parenchyma. Lymph nodes: Examined: 3 Sentinel 0 Non-sentinel 3 Total Lymph nodes with metastasis: 0 Breast prognostic profile: [phone] Estrogen receptor: 100%, strong staining intensity. Progesterone receptor: 99% strong staining  intensity. Her 2 neu: No amplification was detected. The ratio was 1.61. Her 2 neu by CISH will be repeated on the current case and the results reported separately. Ki-67:  45% Non-neoplastic breast: Fibrocystic changes with adenosis and healing biopsy site. TNM: pT1c, pN0. (JBK:gt, 01/27/15) Enid Cutter MD Pathologist, Electronic Signature (Case signed 01/27/2015) Specimen Gross and Clinical Information Specimen(s) Obtained: 1. Breast, lumpectomy, left 2. Lymph node, sentinel, biopsy, left axillary #1 3. Lymph node, sentinel, biopsy, left axillary #2 4. Lymph node, sentinel, biopsy, left axillary #3   ASSESSMENT & PLAN:  ER+ PR+ HER-2 Neu negative carcinoma L breast  pT1c, pN0, M0 Anemia Stage III CKD LLE cellulitis Oncotype Recurrence Score of 2, Low Risk OSTEOPENIA on DEXA 04/30/2015  We discussed endocrine therapy in detail. We discussed the risks and benefits of anti-estrogen therapy with aromatase inhibitors. These include but not limited to insomnia, hot flashes, mood changes, vaginal dryness, bone density loss, and weight gain. Although rare, serious side effects including endometrial cancer, risk of blood clots were also discussed. We strongly believe that the benefits far outweigh the risks. Patient understands these risks and consented to starting treatment. Planned treatment duration is 5 years. We have opted for AI therapy  Aromatase inhibitors have the potential to decrease bone mineral density, thereby increasing the risk of clinical bone fractures. She should take dietary calcium intake (food and supplements) of 1000 to 1200 mg daily and supplemental vitamin D 800 to 1000 international units daily, as well as weight bearing exercise, decreased alcohol consumption. She will need a vitamin D level as well.Prescription vitamin D will be prescribed if needed. We discussed the risk and benefits of Prolia therapy. She currently receives excellent dental care was instructed to advise her dentist that she will be taking a Prolia twice yearly.  Her laboratory studies have not been drawn and therefore the patient will need to return for additional  evaluation of her anemia which is significant. I suspect a significant contribution from her underlying kidney disease.  All questions were answered. The patient knows to call the clinic with any problems, questions or concerns.   Patient will begin Arimidex, prescription was faxed to [facility] in [place].    Calcium and Vitamin D prescription Order Prolia Provided reading information. Follow up in 4 weeks w/ breast exam and Vit. D level check  This note was electronically signed.    This document serves as a record of services personally performed by Ancil Linsey, MD. It was created on her behalf by Janace Hoard, a trained medical scribe. The creation of this record is based on the scribe's personal observations and the provider's statements to them. This document has been checked and approved by the attending provider.   I have reviewed the above documentation for accuracy and completeness and I agree with the above.  Kelby Fam. Whitney Muse, MD

## 2015-06-04 ENCOUNTER — Telehealth: Payer: Self-pay

## 2015-06-04 ENCOUNTER — Ambulatory Visit (INDEPENDENT_AMBULATORY_CARE_PROVIDER_SITE_OTHER): Payer: Medicare Other | Admitting: Internal Medicine

## 2015-06-04 ENCOUNTER — Encounter: Payer: Self-pay | Admitting: Internal Medicine

## 2015-06-04 ENCOUNTER — Encounter (HOSPITAL_COMMUNITY): Payer: Self-pay | Admitting: Hematology & Oncology

## 2015-06-04 VITALS — BP 134/62 | HR 80 | Ht 65.0 in

## 2015-06-04 DIAGNOSIS — Z86010 Personal history of colonic polyps: Secondary | ICD-10-CM | POA: Diagnosis not present

## 2015-06-04 DIAGNOSIS — K219 Gastro-esophageal reflux disease without esophagitis: Secondary | ICD-10-CM

## 2015-06-04 DIAGNOSIS — I480 Paroxysmal atrial fibrillation: Secondary | ICD-10-CM | POA: Diagnosis not present

## 2015-06-04 DIAGNOSIS — Z7901 Long term (current) use of anticoagulants: Secondary | ICD-10-CM

## 2015-06-04 MED ORDER — OMEPRAZOLE 40 MG PO CPDR: DELAYED_RELEASE_CAPSULE | ORAL | Status: DC

## 2015-06-04 NOTE — Telephone Encounter (Signed)
[facility] [address] [place] [address]  RE: [name] DOB: [birth year] MRN: [id number]   Dear Thompson Grayer MD,    We want to  schedule the above patient for an endoscopic procedure. Our records show that she is on anticoagulation therapy.   Please advise as to how long the patient may come off her therapy of xarelto prior to a colonoscopy procedure we want to set up to do in December 2016.  Please fax back/ or route the completed form to Tawanda Schall [place], [place] at [phone].   Sincerely,    Silvano Rusk, MD, [facility]

## 2015-06-04 NOTE — Progress Notes (Signed)
   Subjective:    Patient ID: [name], female    DOB: 1951/02/21, 64 y.o.   MRN: [id number] Cc: GERD f/u   HPI  64 yo here for f/u GERD under control on omeprazole 40 mg daily - no dysphagia Last seen 2014 Hx adenoma of colon 2011 - due for surveillance She just completed radiation for breast Ca On Xarelto for PAF - Dr. Rayann Heman  Medications, allergies, past medical history, past surgical history, family history and social history are reviewed and updated in the EMR.  Review of Systems Uses a cane     Objective:   Physical Exam  BP 134/62 mmHg  Pulse 80  Ht 5\' 5"  (1.651 m)  Wt      Assessment & Plan:  Gastroesophageal reflux disease, esophagitis presence not specified  Hx of adenomatous polyp of colon  Long term current use of anticoagulant  PAF (paroxysmal atrial fibrillation)   Refill omeprazole previsit + colonoscopy Nov 2016 Will query Dr. Rayann Heman re: holding apixiban now and arrange for this at previsit I have explanied that in addition to usual risks of colonoscopy there is extra risk of stroke off apixiban - very rare but real.  GG:EZMO,QHUTM B., MD

## 2015-06-04 NOTE — Patient Instructions (Addendum)
We have sent the following medications to your pharmacy for you to pick up at your convenience: Omeprazole  We will reach out to Dr Rayann Heman regarding holding your xarelto and be back in touch about setting up a pre-visit and colonoscopy appointment.   I appreciate the opportunity to care for you. Silvano Rusk, MD, [facility]

## 2015-06-07 NOTE — Telephone Encounter (Signed)
Pt has a CHADS score of 1.  Okay to hold Xarelto x 24 hours prior to colonoscopy.

## 2015-06-11 ENCOUNTER — Ambulatory Visit (HOSPITAL_COMMUNITY): Payer: Medicare Other

## 2015-06-28 NOTE — Telephone Encounter (Signed)
Spoke with patient and set up pre-visit for 09/03/15 at 8:00AM and colon with Dr Carlean Purl for 09/16/15 at 8:30AM.  They will advise her about Xarelto at pre-visit.

## 2015-07-02 ENCOUNTER — Encounter (HOSPITAL_COMMUNITY): Payer: Medicare Other | Attending: Hematology & Oncology | Admitting: Hematology & Oncology

## 2015-07-02 ENCOUNTER — Other Ambulatory Visit (HOSPITAL_COMMUNITY): Payer: Self-pay

## 2015-07-02 ENCOUNTER — Ambulatory Visit (HOSPITAL_COMMUNITY): Payer: Medicare Other

## 2015-07-02 ENCOUNTER — Other Ambulatory Visit (HOSPITAL_COMMUNITY): Payer: Medicare Other

## 2015-07-02 ENCOUNTER — Encounter (HOSPITAL_COMMUNITY): Payer: Self-pay | Admitting: Hematology & Oncology

## 2015-07-02 VITALS — BP 127/50 | HR 67 | Temp 98.3°F | Resp 18 | Wt 253.2 lb

## 2015-07-02 DIAGNOSIS — N183 Chronic kidney disease, stage 3 (moderate): Secondary | ICD-10-CM

## 2015-07-02 DIAGNOSIS — M8580 Other specified disorders of bone density and structure, unspecified site: Secondary | ICD-10-CM | POA: Diagnosis not present

## 2015-07-02 DIAGNOSIS — Z87891 Personal history of nicotine dependence: Secondary | ICD-10-CM

## 2015-07-02 DIAGNOSIS — D649 Anemia, unspecified: Secondary | ICD-10-CM | POA: Diagnosis not present

## 2015-07-02 DIAGNOSIS — Z79899 Other long term (current) drug therapy: Secondary | ICD-10-CM | POA: Insufficient documentation

## 2015-07-02 DIAGNOSIS — G43909 Migraine, unspecified, not intractable, without status migrainosus: Secondary | ICD-10-CM | POA: Diagnosis not present

## 2015-07-02 DIAGNOSIS — C50212 Malignant neoplasm of upper-inner quadrant of left female breast: Secondary | ICD-10-CM

## 2015-07-02 LAB — CBC WITH DIFFERENTIAL/PLATELET
Basophils Absolute: 0 10*3/uL (ref 0.0–0.1)
Basophils Relative: 1 %
EOS ABS: 0.1 10*3/uL (ref 0.0–0.7)
EOS PCT: 3 %
HCT: 28.8 % — ABNORMAL LOW (ref 36.0–46.0)
Hemoglobin: 8.9 g/dL — ABNORMAL LOW (ref 12.0–15.0)
LYMPHS ABS: 0.9 10*3/uL (ref 0.7–4.0)
Lymphocytes Relative: 15 %
MCH: 26.6 pg (ref 26.0–34.0)
MCHC: 30.9 g/dL (ref 30.0–36.0)
MCV: 86.2 fL (ref 78.0–100.0)
MONOS PCT: 6 %
Monocytes Absolute: 0.3 10*3/uL (ref 0.1–1.0)
Neutro Abs: 4.3 10*3/uL (ref 1.7–7.7)
Neutrophils Relative %: 75 %
PLATELETS: 287 10*3/uL (ref 150–400)
RBC: 3.34 MIL/uL — AB (ref 3.87–5.11)
RDW: 17.4 % — ABNORMAL HIGH (ref 11.5–15.5)
WBC: 5.7 10*3/uL (ref 4.0–10.5)

## 2015-07-02 LAB — COMPREHENSIVE METABOLIC PANEL
ALT: 12 U/L — ABNORMAL LOW (ref 14–54)
ANION GAP: 7 (ref 5–15)
AST: 17 U/L (ref 15–41)
Albumin: 3.5 g/dL (ref 3.5–5.0)
Alkaline Phosphatase: 67 U/L (ref 38–126)
BUN: 19 mg/dL (ref 6–20)
CHLORIDE: 105 mmol/L (ref 101–111)
CO2: 27 mmol/L (ref 22–32)
Calcium: 8.8 mg/dL — ABNORMAL LOW (ref 8.9–10.3)
Creatinine, Ser: 1.06 mg/dL — ABNORMAL HIGH (ref 0.44–1.00)
GFR calc non Af Amer: 53 mL/min — ABNORMAL LOW (ref >60)
Glucose, Bld: 156 mg/dL — ABNORMAL HIGH (ref 65–99)
POTASSIUM: 4 mmol/L (ref 3.5–5.1)
SODIUM: 139 mmol/L (ref 135–145)
Total Bilirubin: 0.3 mg/dL (ref 0.3–1.2)
Total Protein: 7.3 g/dL (ref 6.5–8.1)

## 2015-07-02 NOTE — Patient Instructions (Addendum)
[place] at [facility] Discharge Instructions  RECOMMENDATIONS MADE BY THE CONSULTANT AND ANY TEST RESULTS WILL BE SENT TO YOUR REFERRING PHYSICIAN.  Exam and discussion by Dr. Whitney Muse Your calcium level is too low to give the prolia today.  Will reschedule labs and injection for 2 weeks. Drink plenty of fluids. Report any new lumps, bone pain, shortness of breath or other symptoms.  Report any jaw discomfort.  Follow-up in 3 months for office visit and 6 months for labs and prolia injection.   Thank you for choosing [place] at [facility] to provide your oncology and hematology care.  To afford each patient quality time with our provider, please arrive at least 15 minutes before your scheduled appointment time.    You need to re-schedule your appointment should you arrive 10 or more minutes late.  We strive to give you quality time with our providers, and arriving late affects you and other patients whose appointments are after yours.  Also, if you no show three or more times for appointments you may be dismissed from the clinic at the providers discretion.     Again, thank you for choosing [facility].  Our hope is that these requests will decrease the amount of time that you wait before being seen by our physicians.       _____________________________________________________________  Should you have questions after your visit to [facility], please contact our office at [phone] between the hours of 8:30 a.m. and 4:30 p.m.  Voicemails left after 4:30 p.m. will not be returned until the following business day.  For prescription refill requests, have your pharmacy contact our office.

## 2015-07-02 NOTE — Progress Notes (Signed)
[place] PROGRESS NOTE  Patient Care Team: Shelley Chroman, MD as PCP - General (Internal Medicine)  CHIEF COMPLAINTS/PURPOSE OF CONSULTATION:  Left Breast cancer, pT1c, pN0, M0 ER + (100%) PR+ (99%) Ki-67 45% HER-2/NEU by CISH Negative. Note that the tumor appears to show heterogeneity in regards to HER 2 expression. Oncotype Recurrence Score Result of 2, Low Risk Adjuvant XRT Negative Genetics Evaluation DEXA with osteopenia  HISTORY OF PRESENTING ILLNESS:  [name] 64 y.o. 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 Oncotype score was low risk and she has completed adjuvant radiation. Final staging was stage I.  She has a significant anemia. She was to return for labs for further anemia evaluation but did not show.  [name] is here alone today. She's presently suffering from a migraine. To treat these, she says she usually takes tylenol or excedrin migraine. If that doesn't help, then she goes to the ER and gets a shot. She gets the shot if she "gets to where she can't stand the movement anymore." When she has a migraine, she says she doesn't want anything to move, doesn't want light, etc.  She says she's doing pretty good today aside from her migraine. She states that her mood is okay.  She confirms that her bowels are okay, and denies any blood in her stool.  She takes calcium and vitamin D every day.   MEDICAL HISTORY:  Past Medical History  Diagnosis Date  . Asthma     Spirometry 1989: FEV1 2.12 (71%) with ratio 82.  HFA 90% after coaching 11-09-10  . GERD (gastroesophageal reflux disease)     EGD with HH / esophagitis 06-30-93  . Osteoarthritis   . DDD (degenerative disc disease)   . Pelvic fracture ([place])   . Hiatal hernia   . Migraine   . Anxiety disorder   . Anemia   . Diverticulosis   . Adenomatous colon polyp 1993  . Atrial flutter ([place]) chadsvasc of 3  (11/19/14)    Status post cardioversion 2-12 2013. s/p RFCA 5/13 with JAllred (coumadin and amio stopped)   . Essential hypertension, benign   . Pneumonia   . Fibromyalgia   . Lipoma of colon   . Internal and external hemorrhoids without complication   . Breast cancer ([place])     left  . PONV (postoperative nausea and vomiting)   . Sleep apnea     cpap  . Anxiety   . History of atrial fibrillation   . Acute respiratory failure ([place]) 01/27/2015  . Acute renal failure ([place]) 01/27/2015    SURGICAL HISTORY: Past Surgical History  Procedure Laterality Date  . Total knee arthroplasty      x2 09/2006  . Rotator cuff repair      bilateral  . A flutter ablation  02/22/2012  . Dilation and curettage of uterus    . Colonoscopy  05/2010, 07/2000  . Esophagogastroduodenoscopy  05/2010, 06/1993  . Cardioversion N/A 02/19/2014    Procedure: CARDIOVERSION;  Surgeon: Dorothy Spark, MD;  Location: [place];  Service: Cardiovascular;  Laterality: N/A;  . Atrial flutter ablation N/A 02/22/2012    Procedure: ATRIAL FLUTTER ABLATION;  Surgeon: Thompson Grayer, MD;  Location: [facility] CATH LAB;  Service: Cardiovascular;  Laterality: N/A;  . Breast biopsy Left 11/2014  . Joint replacement    . Rotator cuff repair      bil  . Radioactive  seed guided mastectomy with axillary sentinel lymph node biopsy Left 01/26/2015    Procedure: LEFT  PARTIAL MASTECTOMY WITH  RADIOACTIVE SEED LOCALIZATION, LEFT AXILLARY SENTINEL LYMPH NODE BIOPSY;  Surgeon: Fanny Skates, MD;  Location: [facility];  Service: General;  Laterality: Left;    SOCIAL HISTORY: Social History   Social History  . Marital Status: Widowed    Spouse Name: N/A  . Number of Children: 0  . Years of Education: N/A   Occupational History  . Accounting tech at [employer] History Main Topics  . Smoking status: Former Smoker -- 1.00 packs/day for 20 years    Types: Cigarettes    Quit date: 09/25/1984  . Smokeless tobacco: Never Used    . Alcohol Use: Yes     Comment: occ  . Drug Use: No  . Sexual Activity: Not Currently   Other Topics Concern  . 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 He has been widowed for 13 years. She was married for 1 year and 4 months and her husband past. She had been previously married and states she had infertility issues. She was a smoker and quit smoking 30 years ago. She notes she smoked a pack a day for 20 years.  FAMILY HISTORY: Family History  Problem Relation Age of Onset  . Emphysema Father     smoker  . Heart disease Father   . Breast cancer Mother 88  . Breast cancer Sister 84  . Heart Problems Sister   . Heart disease Sister   . Breast cancer Maternal Aunt 48  . Lung cancer Maternal Uncle 54    smoker  . Colon cancer Paternal Uncle 46  . Diabetes Maternal Grandmother   . Heart Problems Maternal Grandfather   . Stroke Paternal Grandfather   . Lung cancer Maternal Uncle 72    smoker  . Heart Problems Paternal Uncle   . Diabetes Paternal Grandmother    indicated that her mother is deceased. She indicated that her father is deceased. She indicated that both of her sisters are alive. She indicated that her maternal grandmother is deceased. She indicated that her maternal grandfather is deceased. She indicated that her paternal grandmother is deceased. She indicated that her paternal grandfather is deceased. She indicated that her maternal aunt is deceased. She indicated that both of her maternal uncles are deceased. She indicated that her paternal aunt is deceased. She indicated that both of her paternal uncles are deceased.  Her mother died at the age of 1 from pneumonia. Her mother had breast cancer at the age of 14. Her sister also had breast cancer at the age of 13. She died from heart disease at the age of 22 however. He states her sister did have genetic  testing and was told it was negative. 2 brothers are deceased from lung cancer. Her father died at 56 from COPD. She has one sister who is living and healthy. Maternal Aunt had breast cancer at the age of 2.  ALLERGIES:  is allergic to shellfish allergy; ace inhibitors; epinephrine; theophyllines; and propoxyphene hcl.  MEDICATIONS:  Current Outpatient Prescriptions  Medication Sig Dispense Refill  . acetaminophen (TYLENOL) 500 MG tablet Take 1,000-1,500 mg by mouth 2 (two) times daily as needed for headache (migraines).    Marland Kitchen albuterol (PROVENTIL HFA;VENTOLIN HFA) 108 (90  BASE) MCG/ACT inhaler Inhale 2 puffs into the lungs every 4 (four) hours as needed for wheezing or shortness of breath. 18 g 5  . ALPRAZolam (XANAX) 0.5 MG tablet Take 0.5 mg by mouth 2 (two) times daily as needed for anxiety.   0  . anastrozole (ARIMIDEX) 1 MG tablet Take 1 tablet (1 mg total) by mouth daily. 30 tablet 3  . atorvastatin (LIPITOR) 10 MG tablet Take 10 mg by mouth daily.  0  . bisoprolol (ZEBETA) 10 MG tablet Take 1 tablet (10 mg total) by mouth daily. 30 tablet 2  . calcium-vitamin D (OSCAL WITH D) 500-200 MG-UNIT per tablet Take 1 tablet by mouth daily with breakfast. 30 tablet 3  . celecoxib (CELEBREX) 200 MG capsule Take 1 capsule by mouth every 3-4 days  0  . diltiazem (CARDIZEM CD) 180 MG 24 hr capsule Take 1 capsule (180 mg total) by mouth daily. 90 capsule 3  . flecainide (TAMBOCOR) 50 MG tablet Take 1 tablet (50 mg total) by mouth 2 (two) times daily. 180 tablet 3  . fluticasone (FLONASE) 50 MCG/ACT nasal spray Place 2 sprays into both nostrils daily as needed for allergies.   0  . furosemide (LASIX) 40 MG tablet Take 40 mg by mouth daily.  0  . hydrOXYzine (ATARAX/VISTARIL) 25 MG tablet Take 1 tablet by mouth every 6 (six) hours as needed. Itching  0  . losartan (COZAAR) 100 MG tablet Take 100 mg by mouth daily.   1  . omeprazole (PRILOSEC) 40 MG capsule take 1 capsule by mouth once daily before  BREAKFAST 90 capsule 3  . PARoxetine (PAXIL) 20 MG tablet Take 20 mg by mouth daily.    . potassium chloride (K-DUR) 10 MEQ tablet Take 1 tablet by mouth daily.  0  . rivaroxaban (XARELTO) 20 MG TABS tablet Take 1 tablet (20 mg total) by mouth daily with supper. 30 tablet 11  . SYMBICORT 80-4.5 MCG/ACT inhaler inhale 2 puffs by mouth twice a day Rinse mouth after use 10.2 Inhaler 1   No current facility-administered medications for this visit.    ROS  Review of Systems  Constitutional: Negative for fever, chills, weight loss positive for malaise/fatigue.  HENT: Negative for congestion, hearing loss, nosebleeds, sore throat and tinnitus.   Eyes: Negative for blurred vision, double vision, pain and discharge.  Respiratory: Negative for cough, hemoptysis, sputum production, shortness of breath and wheezing.   Cardiovascular: Negative for chest pain, palpitations, claudication, leg swelling and PND.  Gastrointestinal: Negative for heartburn, nausea, vomiting, abdominal pain, diarrhea, constipation, blood in stool and melena.  Genitourinary: Negative for dysuria, urgency, frequency and hematuria.  Musculoskeletal: Negative for falls. Positive for joint and back pain Skin: Negative for itching and rash.  Neurological: Negative for dizziness, tingling, tremors, sensory change, speech change, focal weakness, seizures, loss of consciousness, weakness.  Endo/Heme/Allergies: Does not bruise/bleed easily.  Psychiatric/Behavioral: Negative for depression, suicidal ideas, memory loss and substance abuse. The patient is not nervous/anxious and does not have insomnia.   All other systems reviewed and are negative. 14 point review of systems was performed and is negative except as detailed under history of present illness and above   PHYSICAL EXAMINATION: ECOG PERFORMANCE STATUS: 1 - Symptomatic but completely ambulatory  Filed Vitals:   07/02/15 1038  BP: 127/50  Pulse: 67  Temp: 98.3 F (36.8 C)    Resp: 18   Filed Weights   07/02/15 1038  Weight: 253 lb 3.2 oz (114.851 kg)  Physical Exam  Constitutional: She is oriented to person, place, and time and well-developed, well-nourished, and in no distress. Sitting in a dark room HENT:  Head: Normocephalic and atraumatic.  Nose: Nose normal.  Mouth/Throat: Oropharynx is clear and moist. No oropharyngeal exudate.  Eyes: Conjunctivae and EOM are normal.Right eye exhibits no discharge. Left eye exhibits no discharge. No scleral icterus.  Neck: Normal range of motion. Neck supple. No tracheal deviation present. No thyromegaly present.  Cardiovascular: Normal rate, regular rhythm and normal heart sounds.  Exam reveals no gallop and no friction rub.  No murmur heard. Pulmonary/Chest: Effort normal and breath sounds normal. She has no wheezes. She has no rales.  Abdominal: Soft. Bowel sounds are normal. She exhibits no distension and no mass. There is no tenderness. There is no rebound and no guarding.  Musculoskeletal: Normal range of motion. She exhibits no edema.      Mild pitting edema on the left leg, chronic Lymphadenopathy:    She has no cervical adenopathy.  Neurological: She is alert and oriented to person, place, and time. She has normal reflexes. No cranial nerve deficit. Uses cane Skin: Skin is warm and dry. No rash noted.  Psychiatric: Mood, memory, affect and judgment normal.  Nursing note and vitals reviewed. Breast EXAM: Left breast surgical incision site C/D/I. Minor radiation changes.   LABORATORY DATA:  I have reviewed the data as listed CBC    Component Value Date/Time   WBC 5.7 07/02/2015 1054   RBC 3.34* 07/02/2015 1054   HGB 8.9* 07/02/2015 1054   HCT 28.8* 07/02/2015 1054   PLT 287 07/02/2015 1054   MCV 86.2 07/02/2015 1054   MCH 26.6 07/02/2015 1054   MCHC 30.9 07/02/2015 1054   RDW 17.4* 07/02/2015 1054   LYMPHSABS 0.9 07/02/2015 1054   MONOABS 0.3 07/02/2015 1054   EOSABS 0.1 07/02/2015 1054    BASOSABS 0.0 07/02/2015 1054    PATHOLOGIC STUDIES: FINAL DIAGNOSIS Diagnosis 1. Breast, lumpectomy, left - INVASIVE DUCTAL CARCINOMA, GRADE II/III, SPANNING 1.1 CM. - DUCTAL CARCINOMA IN SITU, LOW GRADE. - THE SURGICAL RESECTION MARGINS ARE NEGATIVE FOR CARCINOMA. - SEE ONCOLOGY TABLE BELOW. 2. Lymph node, sentinel, biopsy, left axillary #1 - THERE IS NO EVIDENCE OF CARCINOMA IN 1 OF 1 LYMPH NODE (0/1). 3. Lymph node, sentinel, biopsy, left axillary #2 - THERE IS NO EVIDENCE OF CARCINOMA IN 1 OF 1 LYMPH NODE (0/1). 4. Lymph node, sentinel, biopsy, left axillary #3 - THERE IS NO EVIDENCE OF CARCINOMA IN 1 OF 1 LYMPH NODE (0/1). Microscopic Comment 1. BREAST, INVASIVE TUMOR, WITH LYMPH NODES PRESENT Specimen, including laterality and lymph node sampling (sentinel, non-sentinel): Left breast and left axillary sentinel nodes. Procedure: Lumpectomy. Histologic type: Ductal Grade: II Tubule formation: 3 Nuclear pleomorphism: 2 Mitotic:1. Tumor size (gross measurement): 1.1 cm Margins: Negative for carcinoma. Invasive, distance to closest margin: Greater than 1.0 cm to all margins. In-situ, distance to closest margin: Greater than 1.0 cm to all margins. Lymphovascular invasion: Not identified. Ductal carcinoma in situ: Present. Grade: Low grade. Extensive intraductal component: Not identified. Lobular neoplasia: Not identified. Tumor focality: Unifocal Treatment effect: N/A Extent of tumor: Confined to breast parenchyma. Lymph nodes: Examined: 3 Sentinel 0 Non-sentinel 3 Total Lymph nodes with metastasis: 0 Breast prognostic profile: [phone] Estrogen receptor: 100%, strong staining intensity. Progesterone receptor: 99% strong staining intensity. Her 2 neu: No amplification was detected. The ratio was 1.61. Her 2 neu by CISH will be repeated on the current case and the results reported  separately. Ki-67: 45% Non-neoplastic breast: Fibrocystic changes with adenosis  and healing biopsy site. TNM: pT1c, pN0. (JBK:gt, 01/27/15) Enid Cutter MD Pathologist, Electronic Signature (Case signed 01/27/2015) Specimen Gross and Clinical Information Specimen(s) Obtained: 1. Breast, lumpectomy, left 2. Lymph node, sentinel, biopsy, left axillary #1 3. Lymph node, sentinel, biopsy, left axillary #2 4. Lymph node, sentinel, biopsy, left axillary #3   ASSESSMENT & PLAN:  ER+ PR+ HER-2 Neu negative carcinoma L breast  pT1c, pN0, M0 Anemia Stage III CKD LLE cellulitis Oncotype Recurrence Score of 2, Low Risk OSTEOPENIA on DEXA 04/30/2015  He is to continue on calcium and vitamin D. She is to continue on her Arimidex therapy. She is tolerating it well without complaints. She has excellent dental care. We have discussed risks and benefits of Prolia in the past. She is proceeding with her first Prolia injection today.  I readdressed her anemia and the importance of further evaluation. I advised her that the severity of her anemia could be contributing significantly to her fatigue. She agrees to return in the next few weeks for previously ordered anemia evaluation labs. She will formally return to see [place] in 3 months.  She has had her flu vaccination.  All questions were answered. The patient knows to call the clinic with any problems, questions or concerns.   Ms. Siegfried gets her mammograms at [facility]. They will let her know when she is due for her next one next year.  This note was electronically signed.    This document serves as a record of services personally performed by Ancil Linsey, MD. It was created on her behalf by Toni Amend, a trained medical scribe. The creation of this record is based on the scribe's personal observations and the provider's statements to them. This document has been checked and approved by the attending provider.   I have reviewed the above documentation for accuracy and completeness and I agree with the above.  Kelby Fam. Whitney Muse, MD

## 2015-07-03 LAB — VITAMIN D 25 HYDROXY (VIT D DEFICIENCY, FRACTURES): Vit D, 25-Hydroxy: 13.2 ng/mL — ABNORMAL LOW (ref 30.0–100.0)

## 2015-07-05 ENCOUNTER — Ambulatory Visit: Payer: Medicare Other | Admitting: Pulmonary Disease

## 2015-07-06 ENCOUNTER — Other Ambulatory Visit: Payer: Self-pay | Admitting: Internal Medicine

## 2015-07-06 NOTE — Telephone Encounter (Signed)
Pt last seen 09/2014 for acute visit w/ TP and recommended to follow up w/ MW in 6 weeks.  No appt ever scheduled.  Pt does have pending appt w/ RA on 10.26.16 to establish for sleep.  Will go ahead and deny Symbicort w/ note to pharmacy that pt is overdue for appt.

## 2015-07-13 ENCOUNTER — Encounter (HOSPITAL_COMMUNITY): Payer: Self-pay

## 2015-07-13 ENCOUNTER — Other Ambulatory Visit (HOSPITAL_COMMUNITY): Payer: Self-pay

## 2015-07-13 MED ORDER — ERGOCALCIFEROL 1.25 MG (50000 UT) PO CAPS: ORAL_CAPSULE | ORAL | Status: DC

## 2015-07-16 ENCOUNTER — Encounter (HOSPITAL_COMMUNITY): Payer: Self-pay

## 2015-07-16 ENCOUNTER — Encounter (HOSPITAL_BASED_OUTPATIENT_CLINIC_OR_DEPARTMENT_OTHER): Payer: Medicare Other

## 2015-07-16 ENCOUNTER — Ambulatory Visit (HOSPITAL_COMMUNITY): Payer: Medicare Other

## 2015-07-16 VITALS — BP 138/61 | HR 66 | Temp 98.1°F | Resp 18

## 2015-07-16 DIAGNOSIS — M8580 Other specified disorders of bone density and structure, unspecified site: Secondary | ICD-10-CM

## 2015-07-16 DIAGNOSIS — Z79899 Other long term (current) drug therapy: Secondary | ICD-10-CM

## 2015-07-16 DIAGNOSIS — C50212 Malignant neoplasm of upper-inner quadrant of left female breast: Secondary | ICD-10-CM | POA: Diagnosis not present

## 2015-07-16 DIAGNOSIS — D649 Anemia, unspecified: Secondary | ICD-10-CM

## 2015-07-16 LAB — COMPREHENSIVE METABOLIC PANEL
ALT: 13 U/L — AB (ref 14–54)
AST: 21 U/L (ref 15–41)
Albumin: 3.6 g/dL (ref 3.5–5.0)
Alkaline Phosphatase: 77 U/L (ref 38–126)
Anion gap: 8 (ref 5–15)
BILIRUBIN TOTAL: 0.5 mg/dL (ref 0.3–1.2)
BUN: 21 mg/dL — AB (ref 6–20)
CALCIUM: 8.9 mg/dL (ref 8.9–10.3)
CO2: 26 mmol/L (ref 22–32)
CREATININE: 1.06 mg/dL — AB (ref 0.44–1.00)
Chloride: 103 mmol/L (ref 101–111)
GFR calc Af Amer: >60 mL/min (ref >60)
GFR, EST NON AFRICAN AMERICAN: 53 mL/min — AB (ref >60)
Glucose, Bld: 124 mg/dL — ABNORMAL HIGH (ref 65–99)
POTASSIUM: 4.2 mmol/L (ref 3.5–5.1)
Sodium: 137 mmol/L (ref 135–145)
TOTAL PROTEIN: 7.5 g/dL (ref 6.5–8.1)

## 2015-07-16 LAB — CBC WITH DIFFERENTIAL/PLATELET
BASOS ABS: 0 10*3/uL (ref 0.0–0.1)
Basophils Relative: 1 %
Eosinophils Absolute: 0.2 10*3/uL (ref 0.0–0.7)
Eosinophils Relative: 3 %
HEMATOCRIT: 30.6 % — AB (ref 36.0–46.0)
Hemoglobin: 9.2 g/dL — ABNORMAL LOW (ref 12.0–15.0)
LYMPHS PCT: 14 %
Lymphs Abs: 0.9 10*3/uL (ref 0.7–4.0)
MCH: 26 pg (ref 26.0–34.0)
MCHC: 30.1 g/dL (ref 30.0–36.0)
MCV: 86.4 fL (ref 78.0–100.0)
MONO ABS: 0.4 10*3/uL (ref 0.1–1.0)
Monocytes Relative: 6 %
NEUTROS ABS: 4.6 10*3/uL (ref 1.7–7.7)
Neutrophils Relative %: 76 %
Platelets: 356 10*3/uL (ref 150–400)
RBC: 3.54 MIL/uL — ABNORMAL LOW (ref 3.87–5.11)
RDW: 17.2 % — AB (ref 11.5–15.5)
WBC: 6 10*3/uL (ref 4.0–10.5)

## 2015-07-16 LAB — FOLATE: FOLATE: 10.4 ng/mL (ref >5.9)

## 2015-07-16 LAB — IRON AND TIBC
Iron: 40 ug/dL (ref 28–170)
SATURATION RATIOS: 9 % — AB (ref 10.4–31.8)
TIBC: 468 ug/dL — ABNORMAL HIGH (ref 250–450)
UIBC: 428 ug/dL

## 2015-07-16 LAB — LACTATE DEHYDROGENASE: LDH: 124 U/L (ref 98–192)

## 2015-07-16 LAB — RETICULOCYTES
RBC.: 3.54 MIL/uL — ABNORMAL LOW (ref 3.87–5.11)
Retic Count, Absolute: 70.8 10*3/uL (ref 19.0–186.0)
Retic Ct Pct: 2 % (ref 0.4–3.1)

## 2015-07-16 LAB — FERRITIN: FERRITIN: 11 ng/mL (ref 11–307)

## 2015-07-16 LAB — VITAMIN B12: Vitamin B-12: 129 pg/mL — ABNORMAL LOW (ref 180–914)

## 2015-07-16 MED ORDER — SODIUM CHLORIDE 0.9 % IV SOLN: Freq: Once | INTRAVENOUS | Status: DC

## 2015-07-16 MED ORDER — DENOSUMAB 60 MG/ML ~~LOC~~ SOLN
60.0000 mg | Freq: Once | SUBCUTANEOUS | Status: AC
  Administered 2015-07-16: 60 mg via SUBCUTANEOUS
  Filled 2015-07-16: qty 1

## 2015-07-16 NOTE — Patient Instructions (Signed)
[place] at [facility] Discharge Instructions  RECOMMENDATIONS MADE BY THE CONSULTANT AND ANY TEST RESULTS WILL BE SENT TO YOUR REFERRING PHYSICIAN.  Prolia injection today. Return as scheduled for lab work and office visit.   Thank you for choosing [place] at [facility] to provide your oncology and hematology care.  To afford each patient quality time with our provider, please arrive at least 15 minutes before your scheduled appointment time.    You need to re-schedule your appointment should you arrive 10 or more minutes late.  We strive to give you quality time with our providers, and arriving late affects you and other patients whose appointments are after yours.  Also, if you no show three or more times for appointments you may be dismissed from the clinic at the providers discretion.     Again, thank you for choosing [facility].  Our hope is that these requests will decrease the amount of time that you wait before being seen by our physicians.       _____________________________________________________________  Should you have questions after your visit to [facility], please contact our office at [phone] between the hours of 8:30 a.m. and 4:30 p.m.  Voicemails left after 4:30 p.m. will not be returned until the following business day.  For prescription refill requests, have your pharmacy contact our office.

## 2015-07-16 NOTE — Progress Notes (Signed)
LABS DRAWN

## 2015-07-16 NOTE — Progress Notes (Signed)
1317:  [name] presents today for injection per the provider's orders.  Prolia administration without incident; see MAR for injection details.  Patient tolerated procedure well and without incident.  No questions or complaints noted at this time.

## 2015-07-17 LAB — HAPTOGLOBIN: Haptoglobin: 172 mg/dL (ref 34–200)

## 2015-07-19 ENCOUNTER — Other Ambulatory Visit (HOSPITAL_COMMUNITY): Payer: Self-pay | Admitting: Hematology & Oncology

## 2015-07-21 ENCOUNTER — Ambulatory Visit: Payer: Medicare Other | Admitting: Pulmonary Disease

## 2015-07-21 ENCOUNTER — Other Ambulatory Visit (HOSPITAL_COMMUNITY): Payer: Self-pay | Admitting: *Deleted

## 2015-07-21 DIAGNOSIS — E538 Deficiency of other specified B group vitamins: Secondary | ICD-10-CM

## 2015-07-26 ENCOUNTER — Other Ambulatory Visit (HOSPITAL_COMMUNITY): Payer: Medicare Other

## 2015-07-26 ENCOUNTER — Ambulatory Visit (HOSPITAL_COMMUNITY): Payer: Medicare Other

## 2015-07-30 ENCOUNTER — Ambulatory Visit (HOSPITAL_COMMUNITY): Payer: Medicare Other

## 2015-07-30 ENCOUNTER — Other Ambulatory Visit (HOSPITAL_COMMUNITY): Payer: Medicare Other

## 2015-07-30 MED ORDER — SODIUM CHLORIDE 0.9 % IV SOLN
510.0000 mg | INTRAVENOUS | Status: AC
  Filled 2015-07-30: qty 17

## 2015-07-30 MED ORDER — SODIUM CHLORIDE 0.9 % IV SOLN: INTRAVENOUS | Status: AC

## 2015-07-30 MED ORDER — CYANOCOBALAMIN 1000 MCG/ML IJ SOLN: 1000.0000 ug | INTRAMUSCULAR | Status: AC

## 2015-08-03 ENCOUNTER — Encounter (HOSPITAL_COMMUNITY): Payer: Medicare Other

## 2015-08-03 ENCOUNTER — Encounter (HOSPITAL_COMMUNITY): Payer: Self-pay

## 2015-08-03 ENCOUNTER — Encounter (HOSPITAL_COMMUNITY): Payer: Medicare Other | Attending: Hematology & Oncology

## 2015-08-03 DIAGNOSIS — D649 Anemia, unspecified: Secondary | ICD-10-CM | POA: Diagnosis present

## 2015-08-03 DIAGNOSIS — E538 Deficiency of other specified B group vitamins: Secondary | ICD-10-CM

## 2015-08-03 DIAGNOSIS — C50212 Malignant neoplasm of upper-inner quadrant of left female breast: Secondary | ICD-10-CM | POA: Insufficient documentation

## 2015-08-03 MED ORDER — FERUMOXYTOL INJECTION 510 MG/17 ML
510.0000 mg | Freq: Once | INTRAVENOUS | Status: AC
  Administered 2015-08-03: 510 mg via INTRAVENOUS
  Filled 2015-08-03: qty 17

## 2015-08-03 MED ORDER — SODIUM CHLORIDE 0.9 % IV SOLN
INTRAVENOUS | Status: DC
  Administered 2015-08-03: 14:00:00 via INTRAVENOUS

## 2015-08-03 MED ORDER — CYANOCOBALAMIN 1000 MCG/ML IJ SOLN
INTRAMUSCULAR | Status: AC
  Filled 2015-08-03: qty 1

## 2015-08-03 MED ORDER — CYANOCOBALAMIN 1000 MCG/ML IJ SOLN
1000.0000 ug | INTRAMUSCULAR | Status: DC
  Administered 2015-08-03: 1000 ug via INTRAMUSCULAR

## 2015-08-03 NOTE — Progress Notes (Signed)
Please see iron infusion for more information

## 2015-08-03 NOTE — Patient Instructions (Signed)
[facility] at [facility] Discharge Instructions  RECOMMENDATIONS MADE BY THE CONSULTANT AND ANY TEST RESULTS WILL BE SENT TO YOUR REFERRING PHYSICIAN.  Iron infusion today Return as scheduled Please call the clinic if you have any questions or concerns  Thank you for choosing [facility] at [facility] to provide your oncology and hematology care.  To afford each patient quality time with our provider, please arrive at least 15 minutes before your scheduled appointment time.    You need to re-schedule your appointment should you arrive 10 or more minutes late.  We strive to give you quality time with our providers, and arriving late affects you and other patients whose appointments are after yours.  Also, if you no show three or more times for appointments you may be dismissed from the clinic at the providers discretion.     Again, thank you for choosing [facility].  Our hope is that these requests will decrease the amount of time that you wait before being seen by our physicians.       _____________________________________________________________  Should you have questions after your visit to [facility], please contact our office at [phone] between the hours of 8:30 a.m. and 4:30 p.m.  Voicemails left after 4:30 p.m. will not be returned until the following business day.  For prescription refill requests, have your pharmacy contact our office.     

## 2015-08-03 NOTE — Progress Notes (Signed)
[name] Tolerated iron infusion well today discharged ambulatory  [name]'s reason for visit today is for an injection and labs as scheduled per MD orders.  Labs were drawn prior to administration of ordered medication.  Venipuncture performed with a 23 gauge butterfly needle to R Antecubital with IV start.  [name] also received vitamin b12 per MD orders; see [facility] for administration details.  [name] tolerated all procedures well and without incident; questions were answered and patient was discharged.

## 2015-08-04 ENCOUNTER — Other Ambulatory Visit: Payer: Self-pay | Admitting: Internal Medicine

## 2015-08-04 LAB — ANTI-PARIETAL ANTIBODY: Parietal Cell Antibody-IgG: 2.7 Units (ref 0.0–20.0)

## 2015-08-04 LAB — INTRINSIC FACTOR ANTIBODIES: INTRINSIC FACTOR: 1 [AU]/ml (ref 0.0–1.1)

## 2015-08-06 ENCOUNTER — Ambulatory Visit (HOSPITAL_COMMUNITY): Payer: Medicare Other

## 2015-08-10 ENCOUNTER — Encounter (HOSPITAL_BASED_OUTPATIENT_CLINIC_OR_DEPARTMENT_OTHER): Payer: Medicare Other

## 2015-08-10 DIAGNOSIS — D649 Anemia, unspecified: Secondary | ICD-10-CM

## 2015-08-10 MED ORDER — CYANOCOBALAMIN 1000 MCG/ML IJ SOLN
1000.0000 ug | INTRAMUSCULAR | Status: DC
  Administered 2015-08-10: 1000 ug via INTRAMUSCULAR

## 2015-08-10 MED ORDER — CYANOCOBALAMIN 1000 MCG/ML IJ SOLN
INTRAMUSCULAR | Status: AC
  Filled 2015-08-10: qty 1

## 2015-08-10 NOTE — Patient Instructions (Signed)
[facility] at [facility] Discharge Instructions  RECOMMENDATIONS MADE BY THE CONSULTANT AND ANY TEST RESULTS WILL BE SENT TO YOUR REFERRING PHYSICIAN.  Vitamin B12 1000 mcg injection given today as ordered. Return as scheduled.  Thank you for choosing [facility] at [facility] to provide your oncology and hematology care.  To afford each patient quality time with our provider, please arrive at least 15 minutes before your scheduled appointment time.    You need to re-schedule your appointment should you arrive 10 or more minutes late.  We strive to give you quality time with our providers, and arriving late affects you and other patients whose appointments are after yours.  Also, if you no show three or more times for appointments you may be dismissed from the clinic at the providers discretion.     Again, thank you for choosing [facility].  Our hope is that these requests will decrease the amount of time that you wait before being seen by our physicians.       _____________________________________________________________  Should you have questions after your visit to [facility], please contact our office at [phone] between the hours of 8:30 a.m. and 4:30 p.m.  Voicemails left after 4:30 p.m. will not be returned until the following business day.  For prescription refill requests, have your pharmacy contact our office.    

## 2015-08-10 NOTE — Progress Notes (Signed)
[name] presents today for injection per MD orders. B12 1000 mcg administered IM in left Upper Arm. Administration without incident. Patient tolerated well.

## 2015-08-13 ENCOUNTER — Ambulatory Visit (HOSPITAL_COMMUNITY): Payer: Medicare Other

## 2015-08-17 ENCOUNTER — Encounter (HOSPITAL_BASED_OUTPATIENT_CLINIC_OR_DEPARTMENT_OTHER): Payer: Medicare Other

## 2015-08-17 DIAGNOSIS — D649 Anemia, unspecified: Secondary | ICD-10-CM

## 2015-08-17 MED ORDER — CYANOCOBALAMIN 1000 MCG/ML IJ SOLN
1000.0000 ug | INTRAMUSCULAR | Status: DC
  Administered 2015-08-17: 1000 ug via INTRAMUSCULAR

## 2015-08-17 MED ORDER — CYANOCOBALAMIN 1000 MCG/ML IJ SOLN
INTRAMUSCULAR | Status: AC
  Filled 2015-08-17: qty 1

## 2015-08-17 NOTE — Patient Instructions (Signed)
[facility] at [facility] Discharge Instructions  RECOMMENDATIONS MADE BY THE CONSULTANT AND ANY TEST RESULTS WILL BE SENT TO YOUR REFERRING PHYSICIAN.  B12 today.    Thank you for choosing [facility] at [facility] to provide your oncology and hematology care.  To afford each patient quality time with our provider, please arrive at least 15 minutes before your scheduled appointment time.    You need to re-schedule your appointment should you arrive 10 or more minutes late.  We strive to give you quality time with our providers, and arriving late affects you and other patients whose appointments are after yours.  Also, if you no show three or more times for appointments you may be dismissed from the clinic at the providers discretion.     Again, thank you for choosing [facility].  Our hope is that these requests will decrease the amount of time that you wait before being seen by our physicians.       _____________________________________________________________  Should you have questions after your visit to [facility], please contact our office at [phone] between the hours of 8:30 a.m. and 4:30 p.m.  Voicemails left after 4:30 p.m. will not be returned until the following business day.  For prescription refill requests, have your pharmacy contact our office.      

## 2015-08-17 NOTE — Progress Notes (Signed)
[name] presents today for injection per MD orders. B12 1086mcg administered IM in left Upper Arm. Administration without incident. Patient tolerated well.  Patient inquired about having the B12 prescription to do the injection at home.  Message was sent to Dr. Darbie Biancardi Muse in reference to her request.

## 2015-08-23 ENCOUNTER — Ambulatory Visit (HOSPITAL_COMMUNITY): Payer: Medicare Other

## 2015-08-24 ENCOUNTER — Ambulatory Visit (HOSPITAL_COMMUNITY): Payer: Medicare Other

## 2015-08-25 ENCOUNTER — Encounter (HOSPITAL_COMMUNITY): Payer: Medicare Other

## 2015-08-25 DIAGNOSIS — D649 Anemia, unspecified: Secondary | ICD-10-CM

## 2015-08-25 MED ORDER — CYANOCOBALAMIN 1000 MCG/ML IJ SOLN
INTRAMUSCULAR | Status: AC
  Filled 2015-08-25: qty 1

## 2015-08-25 MED ORDER — CYANOCOBALAMIN 1000 MCG/ML IJ SOLN
1000.0000 ug | Freq: Once | INTRAMUSCULAR | Status: AC
  Administered 2015-08-25: 1000 ug via INTRAMUSCULAR

## 2015-08-25 NOTE — Progress Notes (Signed)
[name] presents today for injection per MD orders. B12 1000 mcg administered IM in left Upper Arm. Administration without incident. Patient tolerated well.

## 2015-08-25 NOTE — Patient Instructions (Addendum)
[place] at [facility] Discharge Instructions  RECOMMENDATIONS MADE BY THE CONSULTANT AND ANY TEST RESULTS WILL BE SENT TO YOUR REFERRING PHYSICIAN.  Vitamin B12 1000 mcg injection (week 4 of 4) given as ordered. Return as scheduled.  Thank you for choosing [place] at [facility] to provide your oncology and hematology care.  To afford each patient quality time with our provider, please arrive at least 15 minutes before your scheduled appointment time.    You need to re-schedule your appointment should you arrive 10 or more minutes late.  We strive to give you quality time with our providers, and arriving late affects you and other patients whose appointments are after yours.  Also, if you no show three or more times for appointments you may be dismissed from the clinic at the providers discretion.     Again, thank you for choosing [facility].  Our hope is that these requests will decrease the amount of time that you wait before being seen by our physicians.       _____________________________________________________________  Should you have questions after your visit to [facility], please contact our office at [phone] between the hours of 8:30 a.m. and 4:30 p.m.  Voicemails left after 4:30 p.m. will not be returned until the following business day.  For prescription refill requests, have your pharmacy contact our office.

## 2015-08-26 ENCOUNTER — Other Ambulatory Visit (HOSPITAL_COMMUNITY): Payer: Self-pay | Admitting: Emergency Medicine

## 2015-08-26 MED ORDER — CYANOCOBALAMIN 1000 MCG/ML IJ SOLN: 1000.0000 ug | INTRAMUSCULAR | Status: DC

## 2015-08-26 NOTE — Progress Notes (Signed)
Called pt and left a message that vitamin b12 injections were called into her pharmacy.  I left her a message to call us back that if she had any questions about her prescription

## 2015-08-27 ENCOUNTER — Telehealth: Payer: Self-pay | Admitting: *Deleted

## 2015-08-27 NOTE — Telephone Encounter (Signed)
[name] will instruct pt 24 hours. Thanks so much [name] PV

## 2015-08-27 NOTE — Telephone Encounter (Signed)
It wears off in 24 hrs I know we often do 48 but if they say 24 I am fine w/ that

## 2015-08-27 NOTE — Telephone Encounter (Signed)
[name], [facility] at 06/07/2015 12:27 PM     Status: Signed       Expand All Collapse All   Pt has a CHADS score of 1. Okay to hold Xarelto x 24 hours prior to colonoscopy.          Dr Carlean Purl,  the above hold is in this  pt's  Chart.   Does her Xarelto need to be held longer than 24 hours prior to her colon?   Just want to make sure prior to PV  Thanks Lelan Pons PV

## 2015-09-08 ENCOUNTER — Encounter: Payer: Self-pay | Admitting: Internal Medicine

## 2015-09-08 ENCOUNTER — Ambulatory Visit (AMBULATORY_SURGERY_CENTER): Payer: Self-pay | Admitting: *Deleted

## 2015-09-08 VITALS — Ht 65.0 in | Wt 264.8 lb

## 2015-09-08 DIAGNOSIS — Z86010 Personal history of colonic polyps: Secondary | ICD-10-CM

## 2015-09-08 NOTE — Progress Notes (Signed)
No allergies to eggs or soy. No problems with anesthesia.  Pt given Emmi instructions for colonoscopy  No oxygen use  No diet drug use  

## 2015-09-15 ENCOUNTER — Encounter: Payer: Self-pay | Admitting: Internal Medicine

## 2015-09-15 ENCOUNTER — Telehealth: Payer: Self-pay | Admitting: Internal Medicine

## 2015-09-15 NOTE — Telephone Encounter (Signed)
OK No charge 

## 2015-09-16 ENCOUNTER — Encounter: Payer: Medicare Other | Admitting: Internal Medicine

## 2015-09-16 NOTE — Telephone Encounter (Signed)
Pt NOT BILLED Colon Cx Fee

## 2015-09-22 ENCOUNTER — Ambulatory Visit (HOSPITAL_COMMUNITY): Payer: Medicare Other

## 2015-09-24 ENCOUNTER — Other Ambulatory Visit (HOSPITAL_COMMUNITY): Payer: Self-pay

## 2015-10-01 ENCOUNTER — Ambulatory Visit (HOSPITAL_COMMUNITY): Payer: Medicare Other | Admitting: Hematology & Oncology

## 2015-10-01 ENCOUNTER — Other Ambulatory Visit (HOSPITAL_COMMUNITY): Payer: Medicare Other

## 2015-10-04 ENCOUNTER — Other Ambulatory Visit: Payer: Self-pay | Admitting: Internal Medicine

## 2015-10-05 ENCOUNTER — Other Ambulatory Visit (HOSPITAL_COMMUNITY): Payer: Self-pay | Admitting: Oncology

## 2015-10-05 DIAGNOSIS — C50212 Malignant neoplasm of upper-inner quadrant of left female breast: Secondary | ICD-10-CM

## 2015-10-05 DIAGNOSIS — M8580 Other specified disorders of bone density and structure, unspecified site: Secondary | ICD-10-CM

## 2015-10-05 MED ORDER — ANASTROZOLE 1 MG PO TABS: 1.0000 mg | ORAL_TABLET | Freq: Every day | ORAL | Status: DC

## 2015-10-18 ENCOUNTER — Other Ambulatory Visit (HOSPITAL_COMMUNITY): Payer: Self-pay | Admitting: Oncology

## 2015-10-18 DIAGNOSIS — C50212 Malignant neoplasm of upper-inner quadrant of left female breast: Secondary | ICD-10-CM

## 2015-10-19 ENCOUNTER — Other Ambulatory Visit (HOSPITAL_COMMUNITY): Payer: Medicare Other

## 2015-10-19 ENCOUNTER — Ambulatory Visit (HOSPITAL_COMMUNITY): Payer: Medicare Other | Admitting: Hematology & Oncology

## 2015-10-26 ENCOUNTER — Ambulatory Visit: Payer: Medicare Other | Admitting: Pulmonary Disease

## 2015-11-02 ENCOUNTER — Ambulatory Visit (AMBULATORY_SURGERY_CENTER): Payer: Self-pay | Admitting: *Deleted

## 2015-11-02 VITALS — Ht 65.0 in | Wt 265.0 lb

## 2015-11-02 DIAGNOSIS — Z86010 Personal history of colonic polyps: Secondary | ICD-10-CM

## 2015-11-02 NOTE — Progress Notes (Signed)
No egg or soy allergy known to patient   issues with past sedation with any surgeries and  Procedures- pt has a hx of post op Nausea/ vomiting--01-2015 has partial mastectomy and had general anesthesia nad did okay after in recovery but went home and sister couldn't wake her and had to call EMS, be transported to [facility] and given a medicine to reverse her sedation so she now states she does not want to be completely sedated for her colon. She wishes to not have propofol, states she thinks she would rather had moderate sedation. Osvaldo Angst came to [facility]  to talk to patient  about sedation. no intubation problems per patient  No diet pills per patient No home 02 use per patient but uses cpap for OSA Blood thinners-pt takes Xarelto--was instructed to hold 24 hours prior to colon per TE in epic

## 2015-11-04 ENCOUNTER — Other Ambulatory Visit: Payer: Self-pay | Admitting: Internal Medicine

## 2015-11-08 ENCOUNTER — Encounter (HOSPITAL_COMMUNITY): Payer: Medicare Other | Attending: Hematology & Oncology | Admitting: Hematology & Oncology

## 2015-11-08 ENCOUNTER — Encounter (HOSPITAL_COMMUNITY): Payer: Medicare Other

## 2015-11-08 ENCOUNTER — Encounter (HOSPITAL_COMMUNITY): Payer: Self-pay | Admitting: Hematology & Oncology

## 2015-11-08 VITALS — BP 136/63 | HR 65 | Temp 97.3°F | Resp 20 | Wt 261.0 lb

## 2015-11-08 DIAGNOSIS — C50212 Malignant neoplasm of upper-inner quadrant of left female breast: Secondary | ICD-10-CM

## 2015-11-08 DIAGNOSIS — Z79811 Long term (current) use of aromatase inhibitors: Secondary | ICD-10-CM | POA: Diagnosis not present

## 2015-11-08 DIAGNOSIS — N189 Chronic kidney disease, unspecified: Secondary | ICD-10-CM | POA: Diagnosis not present

## 2015-11-08 DIAGNOSIS — Z170 Estrogen receptor positive status [ER+]: Secondary | ICD-10-CM | POA: Diagnosis not present

## 2015-11-08 DIAGNOSIS — Z79899 Other long term (current) drug therapy: Secondary | ICD-10-CM

## 2015-11-08 DIAGNOSIS — M8580 Other specified disorders of bone density and structure, unspecified site: Secondary | ICD-10-CM

## 2015-11-08 DIAGNOSIS — E559 Vitamin D deficiency, unspecified: Secondary | ICD-10-CM

## 2015-11-08 DIAGNOSIS — D649 Anemia, unspecified: Secondary | ICD-10-CM | POA: Insufficient documentation

## 2015-11-08 DIAGNOSIS — D509 Iron deficiency anemia, unspecified: Secondary | ICD-10-CM

## 2015-11-08 DIAGNOSIS — C50912 Malignant neoplasm of unspecified site of left female breast: Secondary | ICD-10-CM

## 2015-11-08 DIAGNOSIS — E538 Deficiency of other specified B group vitamins: Secondary | ICD-10-CM

## 2015-11-08 DIAGNOSIS — D631 Anemia in chronic kidney disease: Secondary | ICD-10-CM

## 2015-11-08 LAB — COMPREHENSIVE METABOLIC PANEL
ALK PHOS: 55 U/L (ref 38–126)
ALT: 15 U/L (ref 14–54)
ANION GAP: 7 (ref 5–15)
AST: 16 U/L (ref 15–41)
Albumin: 3.7 g/dL (ref 3.5–5.0)
BUN: 23 mg/dL — ABNORMAL HIGH (ref 6–20)
CHLORIDE: 102 mmol/L (ref 101–111)
CO2: 31 mmol/L (ref 22–32)
Calcium: 9.1 mg/dL (ref 8.9–10.3)
Creatinine, Ser: 1.15 mg/dL — ABNORMAL HIGH (ref 0.44–1.00)
GFR, EST AFRICAN AMERICAN: 56 mL/min — AB (ref >60)
GFR, EST NON AFRICAN AMERICAN: 48 mL/min — AB (ref >60)
Glucose, Bld: 137 mg/dL — ABNORMAL HIGH (ref 65–99)
Potassium: 4.3 mmol/L (ref 3.5–5.1)
Sodium: 140 mmol/L (ref 135–145)
TOTAL PROTEIN: 7.6 g/dL (ref 6.5–8.1)
Total Bilirubin: 0.6 mg/dL (ref 0.3–1.2)

## 2015-11-08 LAB — CBC WITH DIFFERENTIAL/PLATELET
BASOS ABS: 0 10*3/uL (ref 0.0–0.1)
BASOS PCT: 0 %
EOS ABS: 0.1 10*3/uL (ref 0.0–0.7)
Eosinophils Relative: 2 %
HEMATOCRIT: 29.3 % — AB (ref 36.0–46.0)
HEMOGLOBIN: 9.3 g/dL — AB (ref 12.0–15.0)
Lymphocytes Relative: 17 %
Lymphs Abs: 0.9 10*3/uL (ref 0.7–4.0)
MCH: 27.9 pg (ref 26.0–34.0)
MCHC: 31.7 g/dL (ref 30.0–36.0)
MCV: 88 fL (ref 78.0–100.0)
MONOS PCT: 8 %
Monocytes Absolute: 0.4 10*3/uL (ref 0.1–1.0)
NEUTROS ABS: 3.7 10*3/uL (ref 1.7–7.7)
NEUTROS PCT: 73 %
Platelets: 282 10*3/uL (ref 150–400)
RBC: 3.33 MIL/uL — ABNORMAL LOW (ref 3.87–5.11)
RDW: 17 % — ABNORMAL HIGH (ref 11.5–15.5)
WBC: 5.1 10*3/uL (ref 4.0–10.5)

## 2015-11-08 LAB — IRON AND TIBC
IRON: 61 ug/dL (ref 28–170)
SATURATION RATIOS: 15 % (ref 10.4–31.8)
TIBC: 398 ug/dL (ref 250–450)
UIBC: 337 ug/dL

## 2015-11-08 LAB — FERRITIN: Ferritin: 19 ng/mL (ref 11–307)

## 2015-11-08 LAB — VITAMIN B12: Vitamin B-12: >7500 pg/mL — ABNORMAL HIGH (ref 180–914)

## 2015-11-08 MED ORDER — OXYCODONE HCL 5 MG PO TABS: 5.0000 mg | ORAL_TABLET | ORAL | Status: DC | PRN

## 2015-11-08 MED ORDER — ERGOCALCIFEROL 1.25 MG (50000 UT) PO CAPS: ORAL_CAPSULE | ORAL | Status: DC

## 2015-11-08 MED ORDER — CYANOCOBALAMIN 1000 MCG/ML IJ SOLN: 1000.0000 ug | INTRAMUSCULAR | Status: DC

## 2015-11-08 NOTE — Patient Instructions (Addendum)
..  [place] at [facility] Discharge Instructions  RECOMMENDATIONS MADE BY THE CONSULTANT AND ANY TEST RESULTS WILL BE SENT TO YOUR REFERRING PHYSICIAN.  Refills on Vitamin D and vitamin b12  Follow up with surgeon re: the fluid accumulation under left arm Cbc diff on return in 3 months  Thank you for choosing [place] at [facility] to provide your oncology and hematology care.  To afford each patient quality time with our provider, please arrive at least 15 minutes before your scheduled appointment time.   Beginning January 23rd 2017 lab work for the [facility] will be done in the  Main lab at [facility] on 1st floor. If you have a lab appointment with the [facility] please come in thru the  Main Entrance and check in at the main information desk  You need to re-schedule your appointment should you arrive 10 or more minutes late.  We strive to give you quality time with our providers, and arriving late affects you and other patients whose appointments are after yours.  Also, if you no show three or more times for appointments you may be dismissed from the clinic at the providers discretion.     Again, thank you for choosing [facility].  Our hope is that these requests will decrease the amount of time that you wait before being seen by our physicians.       _____________________________________________________________  Should you have questions after your visit to [facility], please contact our office at [phone] between the hours of 8:30 a.m. and 4:30 p.m.  Voicemails left after 4:30 p.m. will not be returned until the following business day.  For prescription refill requests, have your pharmacy contact our office.

## 2015-11-08 NOTE — Progress Notes (Signed)
[facility] PROGRESS NOTE  Patient Care Team: [name], MD as PCP - General (Internal Medicine)  CHIEF COMPLAINTS/PURPOSE OF CONSULTATION:  Left Breast cancer, pT1c, pN0, M0 ER + (100%) PR+ (99%) Ki-67 45% HER-2/NEU by CISH Negative. Note that the tumor appears to show heterogeneity in regards to HER 2 expression. Oncotype Recurrence Score Result of 2, Low Risk Adjuvant XRT Negative Genetics Evaluation DEXA with osteopenia  HISTORY OF PRESENTING ILLNESS:  [name] 65 y.o. 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 Oncotype score was low risk and [name] has completed adjuvant radiation. Final staging was stage I.  [name] has a significant anemia. [name] was to return for labs for further anemia evaluation but did not show.  [name] is here alone. [name] is not having issues with her arimidex.  Her appetite is good. [name] has fallen a couple of times at her house since our last visit, stating "they were not that bad". [name] is able to get up by herself sometimes, and has her phone on her at all times to call for assistance if [name] needs it. Reports her "back is killing me today". Denies any chest pain.   When asked if [name] has any new lumps or bumps, [name] notes a lump underneath her left arm that [name] will have drained of fluid by her surgeon in April. [name] states this is an ongoing issue.   States that [name] is dreading having her mammogram next month. [name] has her mammograms as [facility] in [place]. [name] is scheduled for a colonoscopy this month with Dr. Carlean Purl at [facility]. [name] does not have a follow up with radiation oncology scheduled.   MEDICAL HISTORY:  Past Medical History  Diagnosis Date  . Asthma     Spirometry 1989: FEV1 2.12 (71%) with ratio 82.  HFA 90% after coaching 11-09-10  . GERD (gastroesophageal reflux disease)     EGD with HH / esophagitis 06-30-93  . Osteoarthritis   . DDD (degenerative  disc disease)   . Pelvic fracture ([place])   . Hiatal hernia   . Migraine   . Anxiety disorder   . Anemia   . Diverticulosis   . Adenomatous colon polyp 1993  . Atrial flutter ([place]) chadsvasc of 3 (11/19/14)    Status post cardioversion 2-12 2013. s/p RFCA 5/13 with JAllred (coumadin and amio stopped)   . Essential hypertension, benign   . Pneumonia   . Fibromyalgia   . Lipoma of colon   . Internal and external hemorrhoids without complication   . Breast cancer ([place])     left  . PONV (postoperative nausea and vomiting)   . Sleep apnea     cpap  . Anxiety   . History of atrial fibrillation   . Acute respiratory failure ([place]) 01/27/2015  . Acute renal failure ([place]) 01/27/2015  . COPD (chronic obstructive pulmonary disease) ([place])   . Hyperlipidemia   . Allergy   . Radiation     left breast cancer    SURGICAL HISTORY: Past Surgical History  Procedure Laterality Date  . Total knee arthroplasty      x2 09/2006  . Rotator cuff repair      bilateral  . A flutter ablation  02/22/2012  . Dilation and curettage of uterus    . Colonoscopy  05/2010, 07/2000  . Esophagogastroduodenoscopy  05/2010, 06/1993  . Cardioversion N/A 02/19/2014    Procedure: CARDIOVERSION;  Surgeon: Dorothy Spark, MD;  Location: [facility];  Service: Cardiovascular;  Laterality: N/A;  . Atrial flutter ablation N/A 02/22/2012    Procedure: ATRIAL FLUTTER ABLATION;  Surgeon: Thompson Grayer, MD;  Location: [facility] CATH LAB;  Service: Cardiovascular;  Laterality: N/A;  . Breast biopsy Left 11/2014  . Joint replacement    . Rotator cuff repair      bil  . Radioactive seed guided mastectomy with axillary sentinel lymph node biopsy Left 01/26/2015    Procedure: LEFT  PARTIAL MASTECTOMY WITH  RADIOACTIVE SEED LOCALIZATION, LEFT AXILLARY SENTINEL LYMPH NODE BIOPSY;  Surgeon: Fanny Skates, MD;  Location: [place];  Service: General;  Laterality: Left;  . Polypectomy    . Upper gastrointestinal endoscopy      SOCIAL HISTORY: Social  History   Social History  . Marital Status: Widowed    Spouse Name: N/A  . Number of Children: 0  . Years of Education: N/A   Occupational History  . Accounting tech at [employer] History Main Topics  . Smoking status: Former Smoker -- 1.00 packs/day for 20 years    Types: Cigarettes    Quit date: 09/25/1984  . Smokeless tobacco: Never Used  . Alcohol Use: No  . Drug Use: No  . Sexual Activity: Not Currently   Other Topics Concern  . 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 He has been widowed for 13 years. [name] was married for 1 year and 4 months and her husband past. [name] had been previously married and states [name] had infertility issues. [name] was a smoker and quit smoking 30 years ago. [name] notes [name] smoked a pack a day for 20 years.  FAMILY HISTORY: Family History  Problem Relation Age of Onset  . Emphysema Father     smoker  . Heart disease Father   . Breast cancer Mother 63  . Breast cancer Sister 13  . Heart Problems Sister   . Heart disease Sister   . Breast cancer Maternal Aunt 48  . Lung cancer Maternal Uncle 73    smoker  . Colon cancer Paternal Uncle 25  . Diabetes Maternal Grandmother   . Heart Problems Maternal Grandfather   . Stroke Paternal Grandfather   . Lung cancer Maternal Uncle 72    smoker  . Heart Problems Paternal Uncle   . Diabetes Paternal Grandmother   . Rectal cancer Neg Hx   . Stomach cancer Neg Hx   . Esophageal cancer Neg Hx    indicated that her mother is deceased. [name] indicated that her father is deceased. [name] indicated that both of her sisters are alive. [name] indicated that her maternal grandmother is deceased. [name] indicated that her maternal grandfather is deceased. [name] indicated that her paternal grandmother is deceased. [name] indicated that her paternal grandfather is deceased. [name] indicated  that her maternal aunt is deceased. [name] indicated that both of her maternal uncles are deceased. [name] indicated that her paternal aunt is deceased. [name] indicated that both of her paternal uncles are deceased.  Her mother died at the age of 58 from pneumonia. Her mother had breast cancer at the age of 30. Her sister also had breast cancer at the age of 90. [name] died from heart disease at the age of 52 however. He states her sister did have genetic  testing and was told it was negative. 2 brothers are deceased from lung cancer. Her father died at 69 from COPD. [name] has one sister who is living and healthy. Maternal Aunt had breast cancer at the age of 28.  ALLERGIES:  is allergic to shellfish allergy; ace inhibitors; epinephrine; theophyllines; and propoxyphene hcl.  MEDICATIONS:  Current Outpatient Prescriptions  Medication Sig Dispense Refill  . acetaminophen (TYLENOL) 500 MG tablet Take 1,000-1,500 mg by mouth 2 (two) times daily as needed for headache (migraines). Reported on 09/08/2015    . albuterol (PROVENTIL HFA;VENTOLIN HFA) 108 (90 BASE) MCG/ACT inhaler Inhale 2 puffs into the lungs every 4 (four) hours as needed for wheezing or shortness of breath. 18 g 5  . ALPRAZolam (XANAX) 0.5 MG tablet Take 0.5 mg by mouth 2 (two) times daily as needed for anxiety.   0  . anastrozole (ARIMIDEX) 1 MG tablet Take 1 tablet (1 mg total) by mouth daily. 30 tablet 3  . atorvastatin (LIPITOR) 10 MG tablet Take 10 mg by mouth daily.  0  . Bisacodyl (DULCOLAX PO) Take by mouth as directed. Dulcolax as directed for colonoscopy prep    . bisoprolol (ZEBETA) 10 MG tablet Take 1 tablet (10 mg total) by mouth daily. 30 tablet 2  . calcium-vitamin D (OSCAL WITH D) 500-200 MG-UNIT per tablet Take 1 tablet by mouth daily with breakfast. 30 tablet 3  . celecoxib (CELEBREX) 200 MG capsule Take 1 capsule by mouth every 3-4 days  0  . cyanocobalamin (,VITAMIN B-12,) 1000 MCG/ML injection Inject 1 mL (1,000 mcg total) into the  skin every 30 (thirty) days. 10 mL 1  . diltiazem (CARDIZEM CD) 180 MG 24 hr capsule Take 1 capsule (180 mg total) by mouth daily. 90 capsule 3  . ergocalciferol (VITAMIN D2) 50000 UNITS capsule Take 1 capsule weekly for 16 weeks then take OTC Vitamin D 2000 units daily 16 capsule 0  . flecainide (TAMBOCOR) 50 MG tablet Take 1 tablet (50 mg total) by mouth 2 (two) times daily. 180 tablet 3  . fluticasone (FLONASE) 50 MCG/ACT nasal spray Place 2 sprays into both nostrils daily as needed for allergies.   0  . furosemide (LASIX) 40 MG tablet Take 40 mg by mouth daily.  0  . hydrOXYzine (ATARAX/VISTARIL) 25 MG tablet Take 1 tablet by mouth every 6 (six) hours as needed. Itching  0  . losartan (COZAAR) 100 MG tablet Take 100 mg by mouth daily.   1  . Nystatin (NYAMYC) 100000 UNIT/GM POWD   1  . omeprazole (PRILOSEC) 40 MG capsule take 1 capsule by mouth once daily before BREAKFAST 90 capsule 3  . PARoxetine (PAXIL) 20 MG tablet Take 20 mg by mouth daily.    . polyethylene glycol powder (MIRALAX) powder Take 1 Container by mouth as directed. Miralax 238 Grams as directed for colonoscopy prep    . potassium chloride (K-DUR) 10 MEQ tablet Take 1 tablet by mouth daily.  0  . rivaroxaban (XARELTO) 20 MG TABS tablet Take 1 tablet (20 mg total) by mouth daily with supper. 30 tablet 11  . SYMBICORT 80-4.5 MCG/ACT inhaler inhale 2 puffs by mouth twice a day Rinse mouth after use 10.2 Inhaler 1   No current facility-administered medications for this visit.   Facility-Administered Medications Ordered in Other Visits  Medication Dose Route Frequency Provider Last Rate Last Dose  . 0.9 %  sodium chloride infusion   Intravenous Continuous Patrici Ranks, MD  ROS  Review of Systems  Constitutional: Negative for fever, chills, weight loss. HENT: Negative for congestion, hearing loss, nosebleeds, sore throat and tinnitus.   Eyes: Negative for blurred vision, double vision, pain and discharge.    Respiratory: Negative for cough, hemoptysis, sputum production, shortness of breath and wheezing.   Cardiovascular: Negative for chest pain, palpitations, claudication, leg swelling and PND.  Gastrointestinal: Negative for heartburn, nausea, vomiting, abdominal pain, diarrhea, constipation, blood in stool and melena.  Genitourinary: Negative for dysuria, urgency, frequency and hematuria.  Musculoskeletal: Negative for falls. Positive for joint and back pain Skin: Negative for itching and rash.  Neurological: Negative for dizziness, tingling, tremors, sensory change, speech change, focal weakness, seizures, loss of consciousness, weakness.  Endo/Heme/Allergies: Does not bruise/bleed easily.  Psychiatric/Behavioral: Negative for depression, suicidal ideas, memory loss and substance abuse. The patient is not nervous/anxious and does not have insomnia.   All other systems reviewed and are negative. 14 point review of systems was performed and is negative except as detailed under history of present illness and above   PHYSICAL EXAMINATION: ECOG PERFORMANCE STATUS: 1 - Symptomatic but completely ambulatory  Filed Vitals:   11/08/15 1109  BP: 136/63  Pulse: 65  Temp: 97.3 F (36.3 C)  Resp: 20   Filed Weights   11/08/15 1109  Weight: 261 lb (118.389 kg)    Physical Exam  Constitutional: [name] is oriented to person, place, and time and well-developed, well-nourished, and in no distress. Sitting in a dark room HENT:  Head: Normocephalic and atraumatic.  Nose: Nose normal.  Mouth/Throat: Oropharynx is clear and moist. No oropharyngeal exudate.  Eyes: Conjunctivae and EOM are normal.Right eye exhibits no discharge. Left eye exhibits no discharge. No scleral icterus.  Neck: Normal range of motion. Neck supple. No tracheal deviation present. No thyromegaly present.  Cardiovascular: Normal rate, regular rhythm and normal heart sounds.  Exam reveals no gallop and no friction rub.  No murmur  heard. Pulmonary/Chest: Effort normal and breath sounds normal. [name] has no wheezes. [name] has no rales.  Abdominal: Soft. Bowel sounds are normal. [name] exhibits no distension and no mass. There is no tenderness. There is no rebound and no guarding.  Musculoskeletal: Normal range of motion. [name] exhibits no edema.      Mild pitting edema on the left leg, chronic Lymphadenopathy:    [name] has no cervical adenopathy.  Neurological: [name] is alert and oriented to person, place, and time. [name] has normal reflexes. No cranial nerve deficit. Uses cane Skin: Skin is warm and dry. No rash noted.  Mass felt in the Left lower axilla about 3-4 cm, mobile. Psychiatric: Mood, memory, affect and judgment normal.  Nursing note and vitals reviewed. Breast EXAM: Left breast surgical incision site C/D/I. Minor radiation changes.   LABORATORY DATA:  I have reviewed the data as listed CBC    Component Value Date/Time   WBC 5.1 11/08/2015 0942   RBC 3.33* 11/08/2015 0942   RBC 3.54* 07/16/2015 1259   HGB 9.3* 11/08/2015 0942   HCT 29.3* 11/08/2015 0942   PLT 282 11/08/2015 0942   MCV 88.0 11/08/2015 0942   MCH 27.9 11/08/2015 0942   MCHC 31.7 11/08/2015 0942   RDW 17.0* 11/08/2015 0942   LYMPHSABS 0.9 11/08/2015 0942   MONOABS 0.4 11/08/2015 0942   EOSABS 0.1 11/08/2015 0942   BASOSABS 0.0 11/08/2015 0942   Results for ATHEENA, SPANO (MRN [id number]) as of 11/08/2015 11:31  Ref. Range 11/08/2015 09:42  Sodium Latest Ref Range:  135-145 mmol/L 140  Potassium Latest Ref Range: 3.5-5.1 mmol/L 4.3  Chloride Latest Ref Range: 101-111 mmol/L 102  CO2 Latest Ref Range: 22-32 mmol/L 31  BUN Latest Ref Range: 6-20 mg/dL 23 (H)  Creatinine Latest Ref Range: 0.44-1.00 mg/dL 1.15 (H)  Calcium Latest Ref Range: 8.9-10.3 mg/dL 9.1  EGFR (Non-African Amer.) Latest Ref Range: >60 mL/min 48 (L)  EGFR (African American) Latest Ref Range: >60 mL/min 56 (L)  Glucose Latest Ref Range: 65-99 mg/dL 137 (H)  Anion  gap Latest Ref Range: 5-15  7  Alkaline Phosphatase Latest Ref Range: 38-126 U/L 55  Albumin Latest Ref Range: 3.5-5.0 g/dL 3.7  AST Latest Ref Range: 15-41 U/L 16  ALT Latest Ref Range: 14-54 U/L 15  Total Protein Latest Ref Range: 6.5-8.1 g/dL 7.6  Total Bilirubin Latest Ref Range: 0.3-1.2 mg/dL 0.6  WBC Latest Ref Range: 4.0-10.5 K/uL 5.1  RBC Latest Ref Range: 3.87-5.11 MIL/uL 3.33 (L)  Hemoglobin Latest Ref Range: 12.0-15.0 g/dL 9.3 (L)  HCT Latest Ref Range: 36.0-46.0 % 29.3 (L)  MCV Latest Ref Range: 78.0-100.0 fL 88.0  MCH Latest Ref Range: 26.0-34.0 pg 27.9  MCHC Latest Ref Range: 30.0-36.0 g/dL 31.7  RDW Latest Ref Range: 11.5-15.5 % 17.0 (H)  Platelets Latest Ref Range: 150-400 K/uL 282  Neutrophils Latest Units: % 73  Lymphocytes Latest Units: % 17  Monocytes Relative Latest Units: % 8  Eosinophil Latest Units: % 2  Basophil Latest Units: % 0  NEUT# Latest Ref Range: 1.7-7.7 K/uL 3.7  Lymphocyte # Latest Ref Range: 0.7-4.0 K/uL 0.9  Monocyte # Latest Ref Range: 0.1-1.0 K/uL 0.4  Eosinophils Absolute Latest Ref Range: 0.0-0.7 K/uL 0.1  Basophils Absolute Latest Ref Range: 0.0-0.1 K/uL 0.0    PATHOLOGIC STUDIES: FINAL DIAGNOSIS Diagnosis 1. Breast, lumpectomy, left - INVASIVE DUCTAL CARCINOMA, GRADE II/III, SPANNING 1.1 CM. - DUCTAL CARCINOMA IN SITU, LOW GRADE. - THE SURGICAL RESECTION MARGINS ARE NEGATIVE FOR CARCINOMA. - SEE ONCOLOGY TABLE BELOW. 2. Lymph node, sentinel, biopsy, left axillary #1 - THERE IS NO EVIDENCE OF CARCINOMA IN 1 OF 1 LYMPH NODE (0/1). 3. Lymph node, sentinel, biopsy, left axillary #2 - THERE IS NO EVIDENCE OF CARCINOMA IN 1 OF 1 LYMPH NODE (0/1). 4. Lymph node, sentinel, biopsy, left axillary #3 - THERE IS NO EVIDENCE OF CARCINOMA IN 1 OF 1 LYMPH NODE (0/1). Microscopic Comment 1. BREAST, INVASIVE TUMOR, WITH LYMPH NODES PRESENT Specimen, including laterality and lymph node sampling (sentinel, non-sentinel): Left breast and left  axillary sentinel nodes. Procedure: Lumpectomy. Histologic type: Ductal Grade: II Tubule formation: 3 Nuclear pleomorphism: 2 Mitotic:1. Tumor size (gross measurement): 1.1 cm Margins: Negative for carcinoma. Invasive, distance to closest margin: Greater than 1.0 cm to all margins. In-situ, distance to closest margin: Greater than 1.0 cm to all margins. Lymphovascular invasion: Not identified. Ductal carcinoma in situ: Present. Grade: Low grade. Extensive intraductal component: Not identified. Lobular neoplasia: Not identified. Tumor focality: Unifocal Treatment effect: N/A Extent of tumor: Confined to breast parenchyma. Lymph nodes: Examined: 3 Sentinel 0 Non-sentinel 3 Total Lymph nodes with metastasis: 0 Breast prognostic profile: [id number] Estrogen receptor: 100%, strong staining intensity. Progesterone receptor: 99% strong staining intensity. Her 2 neu: No amplification was detected. The ratio was 1.61. Her 2 neu by CISH will be repeated on the current case and the results reported separately. Ki-67: 45% Non-neoplastic breast: Fibrocystic changes with adenosis and healing biopsy site. TNM: pT1c, pN0. (JBK:gt, 01/27/15) Enid Cutter MD Pathologist, Electronic Signature (Case signed 01/27/2015) Specimen Gross and  Clinical Information Specimen(s) Obtained: 1. Breast, lumpectomy, left 2. Lymph node, sentinel, biopsy, left axillary #1 3. Lymph node, sentinel, biopsy, left axillary #2 4. Lymph node, sentinel, biopsy, left axillary #3   ASSESSMENT & PLAN:  ER+ PR+ HER-2 Neu negative carcinoma L breast  pT1c, pN0, M0 Anemia Stage III CKD LLE cellulitis Oncotype Recurrence Score of 2, Low Risk OSTEOPENIA on DEXA 04/30/2015 B12 deficiency on B12 replacement, administers at home IM Iron deficiency Vitamin D deficiency  [name] is to continue on calcium and vitamin D. [name] is to continue on her Arimidex therapy. [name] is tolerating it well without complaints. [name] has  excellent dental care. We have discussed risks and benefits of Prolia in the past.   I readdressed her anemia and the importance of further evaluation. I advised her that the severity of her anemia could be contributing significantly to her fatigue. I had advised her in the past that [name] needed a GI evaluation given her iron deficiency, [name] is finally scheduled to have a colonoscopy next month. [name] will be set up for additional iron if needed once her serum ferritin is back. Additional anemia evaluation will be pursued if needed, once her colonoscopy is completed and results are available.   [name] has had her flu vaccination.  [name] gets her mamm[facility] at [facility]. [name] is due for repeat mammography next month.  I would like to get a copy of her notes from Dr. Dalbert Batman in regards to her axillary "mass" and ongoing management.  I have refilled her B12 medication with syringes. I have refilled her calcium and Vitamin D.   The patient is requesting hydrocodone for her persistent back pain. I have written her a prescription for 30 tablets, 5 mg hydrocodone. [name] notes that Dr.Vyas normally prescribes these for her.  [name] will return in 3 months for follow up and labs including CBC with diff. At this next visit, I will perform a breast examination.   All questions were answered. The patient knows to call the clinic with any problems, questions or concerns.   This note was electronically signed.    This document serves as a record of services personally performed by Ancil Linsey, MD. It was created on her behalf by Arlyce Harman, a trained medical scribe. The creation of this record is based on the scribe's personal observations and the provider's statements to them. This document has been checked and approved by the attending provider.   I have reviewed the above documentation for accuracy and completeness and I agree with the above.  Kelby Fam. Whitney Muse, MD

## 2015-11-08 NOTE — Addendum Note (Signed)
Addended by: Kurtis Bushman A on: 11/08/2015 04:52 PM   Modules accepted: Orders

## 2015-11-09 ENCOUNTER — Other Ambulatory Visit (HOSPITAL_COMMUNITY): Payer: Self-pay | Admitting: Oncology

## 2015-11-09 DIAGNOSIS — D509 Iron deficiency anemia, unspecified: Secondary | ICD-10-CM

## 2015-11-16 ENCOUNTER — Encounter: Payer: Self-pay | Admitting: Internal Medicine

## 2015-11-16 ENCOUNTER — Ambulatory Visit (AMBULATORY_SURGERY_CENTER): Payer: Medicare Other | Admitting: Internal Medicine

## 2015-11-16 VITALS — BP 177/69 | HR 62 | Temp 97.8°F | Resp 14 | Ht 65.0 in | Wt 265.0 lb

## 2015-11-16 DIAGNOSIS — K635 Polyp of colon: Secondary | ICD-10-CM

## 2015-11-16 DIAGNOSIS — D123 Benign neoplasm of transverse colon: Secondary | ICD-10-CM | POA: Diagnosis not present

## 2015-11-16 DIAGNOSIS — D125 Benign neoplasm of sigmoid colon: Secondary | ICD-10-CM

## 2015-11-16 DIAGNOSIS — D122 Benign neoplasm of ascending colon: Secondary | ICD-10-CM

## 2015-11-16 DIAGNOSIS — Z86010 Personal history of colonic polyps: Secondary | ICD-10-CM | POA: Diagnosis present

## 2015-11-16 DIAGNOSIS — D124 Benign neoplasm of descending colon: Secondary | ICD-10-CM

## 2015-11-16 MED ORDER — SODIUM CHLORIDE 0.9 % IV SOLN: 500.0000 mL | INTRAVENOUS | Status: DC

## 2015-11-16 NOTE — Progress Notes (Signed)
Called to room to assist during endoscopic procedure.  Patient ID and intended procedure confirmed with present staff. Received instructions for my participation in the procedure from the performing physician.  

## 2015-11-16 NOTE — Patient Instructions (Addendum)
I found and removed 5 small polyps. I will let you know pathology results and when to have another routine colonoscopy by mail.  Please restart Xarelto tomorrow 2/22.  I appreciate the opportunity to care for you. Gatha Mayer, MD, FACGYOU HAD AN ENDOSCOPIC PROCEDURE TODAY AT [facility]:   Refer to the procedure report that was given to you for any specific questions about what was found during the examination.  If the procedure report does not answer your questions, please call your gastroenterologist to clarify.  If you requested that your care partner not be given the details of your procedure findings, then the procedure report has been included in a sealed envelope for you to review at your convenience later.  YOU SHOULD EXPECT: Some feelings of bloating in the abdomen. Passage of more gas than usual.  Walking can help get rid of the air that was put into your GI tract during the procedure and reduce the bloating. If you had a lower endoscopy (such as a colonoscopy or flexible sigmoidoscopy) you may notice spotting of blood in your stool or on the toilet paper. If you underwent a bowel prep for your procedure, you may not have a normal bowel movement for a few days.  Please Note:  You might notice some irritation and congestion in your nose or some drainage.  This is from the oxygen used during your procedure.  There is no need for concern and it should clear up in a day or so.  SYMPTOMS TO REPORT IMMEDIATELY:   Following lower endoscopy (colonoscopy or flexible sigmoidoscopy):  Excessive amounts of blood in the stool  Significant tenderness or worsening of abdominal pains  Swelling of the abdomen that is new, acute  Fever of 100F or higher   For urgent or emergent issues, a gastroenterologist can be reached at any hour by calling [phone].   DIET: Your first meal following the procedure should be a small meal and then it is ok to progress to your  normal diet. Heavy or fried foods are harder to digest and may make you feel nauseous or bloated.  Likewise, meals heavy in dairy and vegetables can increase bloating.  Drink plenty of fluids but you should avoid alcoholic beverages for 24 hours.  ACTIVITY:  You should plan to take it easy for the rest of today and you should NOT DRIVE or use heavy machinery until tomorrow (because of the sedation medicines used during the test).    FOLLOW UP: Our staff will call the number listed on your records the next business day following your procedure to check on you and address any questions or concerns that you may have regarding the information given to you following your procedure. If we do not reach you, we will leave a message.  However, if you are feeling well and you are not experiencing any problems, there is no need to return our call.  We will assume that you have returned to your regular daily activities without incident.  If any biopsies were taken you will be contacted by phone or by letter within the next 1-3 weeks.  Please call us at [phone] if you have not heard about the biopsies in 3 weeks.  Polyp and diverticulosis information given.    SIGNATURES/CONFIDENTIALITY: You and/or your care partner have signed paperwork which will be entered into your electronic medical record.  These signatures attest to the fact that that the information above on your After  Visit Summary has been reviewed and is understood.  Full responsibility of the confidentiality of this discharge information lies with you and/or your care-partner.

## 2015-11-16 NOTE — Progress Notes (Signed)
Report to PACU, RN, vss, BBS= Clear.  

## 2015-11-16 NOTE — Op Note (Signed)
[place]  [employer]. [place], [id number]   COLONOSCOPY PROCEDURE REPORT  PATIENT: [name], [name]  MR#: [id number] BIRTHDATE: April 10, 1951 , 49  yrs. old GENDER: female ENDOSCOPIST: Gatha Mayer, MD, [facility] PROCEDURE DATE:  11/16/2015 PROCEDURE:   Colonoscopy with snare polypectomy and Colonoscopy, surveillance First Screening Colonoscopy - Avg.  risk and is 50 yrs.  old or older - No.  Prior Negative Screening - Now for repeat screening. N/A  History of Adenoma - Now for follow-up colonoscopy & has been > or = to 3 yrs.  Yes hx of adenoma.  Has been 3 or more years since last colonoscopy.  Polyps removed today? Yes ASA CLASS:   Class III INDICATIONS:Surveillance due to prior colonic neoplasia and PH Colon Adenoma. MEDICATIONS: Propofol 200 mg IV and Monitored anesthesia care  DESCRIPTION OF PROCEDURE:   After the risks benefits and alternatives of the procedure were thoroughly explained, informed consent was obtained.  The digital rectal exam revealed no abnormalities of the rectum.   The LB PFC-H190 [id number]  endoscope was introduced through the anus and advanced to the cecum, which was identified by both the appendix and ileocecal valve. No adverse events experienced.   The quality of the prep was good.  (MiraLax was used)  The instrument was then slowly withdrawn as the colon was fully examined. Estimated blood loss is zero unless otherwise noted in this procedure report.      COLON FINDINGS: Five sessile polyps ranging from 4 to 1mm in size were found in the descending colon, sigmoid colon, ascending colon, and transverse colon.  Polypectomies were performed with a cold snare.  The resection was complete, the polyp tissue was completely retrieved and sent to histology.   There was diverticulosis noted in the sigmoid colon.   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   The examination was otherwise normal. Retroflexed views  revealed no abnormalities. The time to cecum = 2.0 Withdrawal time = 12.5   The scope was withdrawn and the procedure completed. COMPLICATIONS: There were no immediate complications.  ENDOSCOPIC IMPRESSION: 1.   Five sessile polyps ranging from 4 to 38mm in size were found in the descending colon, sigmoid colon, ascending colon, and transverse colon; polypectomies were performed with a cold snare 2.   Diverticulosis was noted in the sigmoid colon 3.   Medium sized lipoma in the descending colon 4.   The examination was otherwise normal  RECOMMENDATIONS: 1.  Timing of repeat colonoscopy will be determined by pathology findings. 2.  Restart xarelto tomorro 2/22  eSigned:  Gatha Mayer, MD, [facility] 11/16/2015 3:14 PM   cc: Dr. Francesco Runner and The Patient   PATIENT NAME:  [name], [name] MR#: [id number]

## 2015-11-17 ENCOUNTER — Telehealth: Payer: Self-pay

## 2015-11-17 NOTE — Telephone Encounter (Signed)
  Follow up Call-  Call back number 11/16/2015  Post procedure Call Back phone  # [phone]  Permission to leave phone message Yes     Patient was called for follow up after procedure on 11/16/2015. No answer at the number given for follow up phone call. A message was left on the answering machine.

## 2015-11-19 ENCOUNTER — Ambulatory Visit (HOSPITAL_COMMUNITY): Payer: Medicare Other

## 2015-11-23 NOTE — Progress Notes (Signed)
This encounter was created in error - please disregard.

## 2015-11-24 ENCOUNTER — Ambulatory Visit (HOSPITAL_COMMUNITY): Payer: Medicare Other

## 2015-11-24 ENCOUNTER — Encounter: Payer: Self-pay | Admitting: Internal Medicine

## 2015-11-24 DIAGNOSIS — Z86010 Personal history of colonic polyps: Secondary | ICD-10-CM

## 2015-11-24 NOTE — Progress Notes (Signed)
Quick Note:  4 adenomas max 7 mm Recall 2020 ______

## 2015-11-29 ENCOUNTER — Ambulatory Visit (HOSPITAL_COMMUNITY): Payer: Medicare Other

## 2015-12-02 ENCOUNTER — Ambulatory Visit (HOSPITAL_COMMUNITY): Payer: Medicare Other

## 2015-12-03 ENCOUNTER — Other Ambulatory Visit: Payer: Self-pay | Admitting: *Deleted

## 2015-12-03 ENCOUNTER — Other Ambulatory Visit: Payer: Self-pay | Admitting: Internal Medicine

## 2015-12-03 DIAGNOSIS — E538 Deficiency of other specified B group vitamins: Secondary | ICD-10-CM

## 2015-12-03 DIAGNOSIS — I480 Paroxysmal atrial fibrillation: Secondary | ICD-10-CM

## 2015-12-03 MED ORDER — DILTIAZEM HCL ER COATED BEADS 180 MG PO CP24: 180.0000 mg | ORAL_CAPSULE | Freq: Every day | ORAL | Status: DC

## 2015-12-06 ENCOUNTER — Encounter (HOSPITAL_COMMUNITY): Payer: Medicare Other | Attending: Hematology & Oncology

## 2015-12-06 VITALS — BP 154/67 | HR 65 | Temp 97.6°F | Resp 16

## 2015-12-06 DIAGNOSIS — D649 Anemia, unspecified: Secondary | ICD-10-CM | POA: Insufficient documentation

## 2015-12-06 DIAGNOSIS — C50212 Malignant neoplasm of upper-inner quadrant of left female breast: Secondary | ICD-10-CM | POA: Insufficient documentation

## 2015-12-06 DIAGNOSIS — D509 Iron deficiency anemia, unspecified: Secondary | ICD-10-CM

## 2015-12-06 MED ORDER — SODIUM CHLORIDE 0.9 % IV SOLN: INTRAVENOUS | Status: DC

## 2015-12-06 MED ORDER — SODIUM CHLORIDE 0.9 % IV SOLN
510.0000 mg | Freq: Once | INTRAVENOUS | Status: DC
  Filled 2015-12-06: qty 17

## 2015-12-06 NOTE — Progress Notes (Signed)
Unable to start IV after apx 10 sticks by 4 nurses. Patient opted to reschedule appointment for later date.

## 2015-12-15 ENCOUNTER — Encounter (HOSPITAL_BASED_OUTPATIENT_CLINIC_OR_DEPARTMENT_OTHER): Payer: Medicare Other

## 2015-12-15 VITALS — BP 131/51 | HR 63 | Temp 98.0°F | Resp 18

## 2015-12-15 DIAGNOSIS — D509 Iron deficiency anemia, unspecified: Secondary | ICD-10-CM

## 2015-12-15 DIAGNOSIS — D649 Anemia, unspecified: Secondary | ICD-10-CM

## 2015-12-15 DIAGNOSIS — E611 Iron deficiency: Secondary | ICD-10-CM | POA: Diagnosis not present

## 2015-12-15 MED ORDER — SODIUM CHLORIDE 0.9 % IV SOLN
INTRAVENOUS | Status: DC
  Administered 2015-12-15: 09:00:00 via INTRAVENOUS

## 2015-12-15 MED ORDER — SODIUM CHLORIDE 0.9% FLUSH
10.0000 mL | Freq: Once | INTRAVENOUS | Status: AC
  Administered 2015-12-15: 10 mL via INTRAVENOUS

## 2015-12-15 MED ORDER — SODIUM CHLORIDE 0.9 % IV SOLN
510.0000 mg | Freq: Once | INTRAVENOUS | Status: AC
  Administered 2015-12-15: 510 mg via INTRAVENOUS
  Filled 2015-12-15: qty 17

## 2015-12-15 NOTE — Patient Instructions (Signed)
[facility] at [facility] Discharge Instructions  RECOMMENDATIONS MADE BY THE CONSULTANT AND ANY TEST RESULTS WILL BE SENT TO YOUR REFERRING PHYSICIAN.  Feraheme 510 mg iron infusion given today as ordered.  Thank you for choosing [facility] at [facility] to provide your oncology and hematology care.  To afford each patient quality time with our provider, please arrive at least 15 minutes before your scheduled appointment time.   Beginning January 23rd 2017 lab work for the Cancer Center will be done in the  Main lab at [facility] on 1st floor. If you have a lab appointment with the Cancer Center please come in thru the  Main Entrance and check in at the main information desk  You need to re-schedule your appointment should you arrive 10 or more minutes late.  We strive to give you quality time with our providers, and arriving late affects you and other patients whose appointments are after yours.  Also, if you no show three or more times for appointments you may be dismissed from the clinic at the providers discretion.     Again, thank you for choosing [facility].  Our hope is that these requests will decrease the amount of time that you wait before being seen by our physicians.       _____________________________________________________________  Should you have questions after your visit to [facility], please contact our office at [phone] between the hours of 8:30 a.m. and 4:30 p.m.  Voicemails left after 4:30 p.m. will not be returned until the following business day.  For prescription refill requests, have your pharmacy contact our office.         Resources For Cancer Patients and their Caregivers ? American Cancer Society: Can assist with transportation, wigs, general needs, runs Look Good Feel Better.        1-888-227-6333 ? Cancer Care: Provides financial assistance, online support groups,  medication/co-pay assistance.  1-800-813-HOPE (4673) ? [facility] Assists [place] cancer patients and their families through emotional , educational and financial support.  336-427-4357 ? [place] DSS Where to apply for food stamps, Medicaid and utility assistance. 336-342-1394 ? RCATS: Transportation to medical appointments. 336-347-2287 ? Social Security Administration: May apply for disability if have a Stage IV cancer. 336-342-7796 1-800-772-1213 ? [place] Aging, Disability and Transit Services: Assists with nutrition, care and transit needs. 336-349-2343 

## 2015-12-15 NOTE — Progress Notes (Signed)
Tolerated iron infusion well. Ambulatory on discharge home to self. 

## 2015-12-28 ENCOUNTER — Telehealth: Payer: Self-pay | Admitting: Internal Medicine

## 2015-12-28 MED ORDER — BUDESONIDE-FORMOTEROL FUMARATE 80-4.5 MCG/ACT IN AERO: INHALATION_SPRAY | RESPIRATORY_TRACT | Status: DC

## 2015-12-28 NOTE — Telephone Encounter (Signed)
Spoke with the pt  I advised we will refill symbicort, but needs to be sure and keep pending appt for any additional refills  She verbalized understanding and rx was sent

## 2015-12-31 ENCOUNTER — Ambulatory Visit (HOSPITAL_COMMUNITY): Payer: Medicare Other

## 2015-12-31 ENCOUNTER — Other Ambulatory Visit (HOSPITAL_COMMUNITY): Payer: Medicare Other

## 2016-01-04 ENCOUNTER — Ambulatory Visit: Payer: Medicare Other | Admitting: Internal Medicine

## 2016-01-11 ENCOUNTER — Other Ambulatory Visit: Payer: Self-pay | Admitting: Internal Medicine

## 2016-01-11 MED ORDER — BUDESONIDE-FORMOTEROL FUMARATE 80-4.5 MCG/ACT IN AERO: INHALATION_SPRAY | RESPIRATORY_TRACT | Status: DC

## 2016-01-14 ENCOUNTER — Other Ambulatory Visit (HOSPITAL_COMMUNITY): Payer: Medicare Other

## 2016-01-14 ENCOUNTER — Ambulatory Visit (HOSPITAL_COMMUNITY): Payer: Medicare Other

## 2016-01-18 ENCOUNTER — Ambulatory Visit: Payer: Medicare Other | Admitting: Internal Medicine

## 2016-01-18 ENCOUNTER — Ambulatory Visit (HOSPITAL_COMMUNITY): Payer: Medicare Other

## 2016-01-18 ENCOUNTER — Other Ambulatory Visit (HOSPITAL_COMMUNITY): Payer: Medicare Other

## 2016-01-20 ENCOUNTER — Encounter (HOSPITAL_COMMUNITY): Payer: Medicare Other

## 2016-01-20 ENCOUNTER — Encounter (HOSPITAL_COMMUNITY): Payer: Medicare Other | Attending: Hematology & Oncology

## 2016-01-20 VITALS — BP 131/60 | HR 69 | Temp 98.0°F | Resp 22

## 2016-01-20 DIAGNOSIS — D649 Anemia, unspecified: Secondary | ICD-10-CM | POA: Diagnosis present

## 2016-01-20 DIAGNOSIS — C50212 Malignant neoplasm of upper-inner quadrant of left female breast: Secondary | ICD-10-CM | POA: Diagnosis not present

## 2016-01-20 DIAGNOSIS — M8580 Other specified disorders of bone density and structure, unspecified site: Secondary | ICD-10-CM

## 2016-01-20 DIAGNOSIS — D509 Iron deficiency anemia, unspecified: Secondary | ICD-10-CM

## 2016-01-20 DIAGNOSIS — Z79899 Other long term (current) drug therapy: Secondary | ICD-10-CM

## 2016-01-20 LAB — COMPREHENSIVE METABOLIC PANEL
ALT: 18 U/L (ref 14–54)
AST: 18 U/L (ref 15–41)
Albumin: 3.8 g/dL (ref 3.5–5.0)
Alkaline Phosphatase: 64 U/L (ref 38–126)
Anion gap: 10 (ref 5–15)
BILIRUBIN TOTAL: 0.5 mg/dL (ref 0.3–1.2)
BUN: 26 mg/dL — ABNORMAL HIGH (ref 6–20)
CALCIUM: 9.3 mg/dL (ref 8.9–10.3)
CO2: 28 mmol/L (ref 22–32)
Chloride: 100 mmol/L — ABNORMAL LOW (ref 101–111)
Creatinine, Ser: 1.37 mg/dL — ABNORMAL HIGH (ref 0.44–1.00)
GFR calc Af Amer: 45 mL/min — ABNORMAL LOW (ref >60)
GFR, EST NON AFRICAN AMERICAN: 39 mL/min — AB (ref >60)
GLUCOSE: 132 mg/dL — AB (ref 65–99)
POTASSIUM: 4.3 mmol/L (ref 3.5–5.1)
Sodium: 138 mmol/L (ref 135–145)
TOTAL PROTEIN: 7.8 g/dL (ref 6.5–8.1)

## 2016-01-20 LAB — CBC
HEMATOCRIT: 33.4 % — AB (ref 36.0–46.0)
HEMOGLOBIN: 10.5 g/dL — AB (ref 12.0–15.0)
MCH: 28 pg (ref 26.0–34.0)
MCHC: 31.4 g/dL (ref 30.0–36.0)
MCV: 89.1 fL (ref 78.0–100.0)
Platelets: 304 10*3/uL (ref 150–400)
RBC: 3.75 MIL/uL — AB (ref 3.87–5.11)
RDW: 17.1 % — ABNORMAL HIGH (ref 11.5–15.5)
WBC: 5.8 10*3/uL (ref 4.0–10.5)

## 2016-01-20 LAB — FERRITIN: FERRITIN: 94 ng/mL (ref 11–307)

## 2016-01-20 MED ORDER — SODIUM CHLORIDE 0.9 % IV SOLN: Freq: Once | INTRAVENOUS | Status: DC

## 2016-01-20 MED ORDER — DENOSUMAB 60 MG/ML ~~LOC~~ SOLN
60.0000 mg | Freq: Once | SUBCUTANEOUS | Status: AC
  Administered 2016-01-20: 60 mg via SUBCUTANEOUS
  Filled 2016-01-20: qty 1

## 2016-01-20 NOTE — Progress Notes (Signed)
Margretta Ditty presents today for injection per MD orders. Prolia 60 mg administered SQ in right Abdomen. Administration without incident. Patient tolerated well.

## 2016-01-20 NOTE — Patient Instructions (Signed)
[place] at [facility] Discharge Instructions  RECOMMENDATIONS MADE BY THE CONSULTANT AND ANY TEST RESULTS WILL BE SENT TO YOUR REFERRING PHYSICIAN.  Prolia 60 mg injection given as ordered today. Return as scheduled.  Thank you for choosing [place] at [facility] to provide your oncology and hematology care.  To afford each patient quality time with our provider, please arrive at least 15 minutes before your scheduled appointment time.   Beginning January 23rd 2017 lab work for the [facility] will be done in the  Main lab at [facility] on 1st floor. If you have a lab appointment with the [facility] please come in thru the  Main Entrance and check in at the main information desk  You need to re-schedule your appointment should you arrive 10 or more minutes late.  We strive to give you quality time with our providers, and arriving late affects you and other patients whose appointments are after yours.  Also, if you no show three or more times for appointments you may be dismissed from the clinic at the providers discretion.     Again, thank you for choosing [facility].  Our hope is that these requests will decrease the amount of time that you wait before being seen by our physicians.       _____________________________________________________________  Should you have questions after your visit to [facility], please contact our office at [phone] between the hours of 8:30 a.m. and 4:30 p.m.  Voicemails left after 4:30 p.m. will not be returned until the following business day.  For prescription refill requests, have your pharmacy contact our office.         Resources For Cancer Patients and their Caregivers ? American Cancer Society: Can assist with transportation, wigs, general needs, runs Look Good Feel Better.        (678)319-1138 ? Cancer Care: Provides financial assistance, online support  groups, medication/co-pay assistance.  1-800-813-HOPE (228) 171-0292) ? [facility] Assists [place] cancer patients and their families through emotional , educational and financial support.  (902) 308-8461 ? [place] DSS Where to apply for food stamps, Medicaid and utility assistance. 223-809-4658 ? RCATS: Transportation to medical appointments. (386)459-9720 ? Social Security Administration: May apply for disability if have a Stage IV cancer. (615)403-8413 620 659 2300 ? [employer], Disability and Transit Services: Assists with nutrition, care and transit needs. 445-545-5167

## 2016-01-21 ENCOUNTER — Other Ambulatory Visit (HOSPITAL_COMMUNITY): Payer: Self-pay | Admitting: Oncology

## 2016-01-21 DIAGNOSIS — D509 Iron deficiency anemia, unspecified: Secondary | ICD-10-CM

## 2016-02-01 ENCOUNTER — Ambulatory Visit (INDEPENDENT_AMBULATORY_CARE_PROVIDER_SITE_OTHER): Payer: Medicare Other | Admitting: Internal Medicine

## 2016-02-01 ENCOUNTER — Encounter: Payer: Self-pay | Admitting: Internal Medicine

## 2016-02-01 VITALS — BP 160/70 | HR 73 | Ht 65.0 in | Wt 270.0 lb

## 2016-02-01 DIAGNOSIS — J45991 Cough variant asthma: Secondary | ICD-10-CM | POA: Diagnosis not present

## 2016-02-01 LAB — NITRIC OXIDE: Nitric Oxide: 9

## 2016-02-01 MED ORDER — AMOXICILLIN-POT CLAVULANATE 875-125 MG PO TABS: 1.0000 | ORAL_TABLET | Freq: Two times a day (BID) | ORAL | Status: DC

## 2016-02-01 MED ORDER — PREDNISONE 10 MG PO TABS: ORAL_TABLET | ORAL | Status: DC

## 2016-02-01 MED ORDER — BUDESONIDE-FORMOTEROL FUMARATE 80-4.5 MCG/ACT IN AERO: INHALATION_SPRAY | RESPIRATORY_TRACT | Status: DC

## 2016-02-01 MED ORDER — FLUTTER DEVI: Status: DC

## 2016-02-01 NOTE — Patient Instructions (Addendum)
Augmentin 875 mg take one pill twice daily  X 10 days - take at breakfast and supper with large glass of water.  It would help reduce the usual side effects (diarrhea and yeast infections) if you ate cultured yogurt at lunch.  Mucinex DM 1200 mg Twice daily  As needed  Cough/congestion  And use the flutter valve Saline nasal rinses As needed   Prednisone 10 mg take  4 each am x 2 days,   2 each am x 2 days,  1 each am x 2 days and stop   Keep appt to see Dr Elsworth Soho and he can take over on your asthma meds and /or I can see you back here as needed

## 2016-02-01 NOTE — Progress Notes (Signed)
Subjective:    Patient ID: [name], female    DOB: 11-10-1950    MRN: [id number]    Brief patient profile:  78 yowf quit smoking in 1986 with morbid obesity and  history of Asthma and GERD.     History of Present Illness  July 19, 2009--Presents for an acute office visit.x 2d. tripped in bathroom, fell against toilet seat, hitting right ribs on toilet seat. Pain w/ inspiration and very tender to touch along right lateral ribs. No skinbreak. No bruising noted. Has had recent bronchitis tx w/ doxcycline/pred last week, better w/ decreased cough adn congestion. No discolored mucus or fever. .Cont on celebrex  Warm heat to ribs.  Vicodin 1-2 every 4-6 hrs as needed pain.   12/28/2010 ov much better on symbicort, still pnds rec Ok to try symbicort 160 1-2 every 12 hours For drainage try chlortrimeton 4mg  one every 6 hours  Seen by [name] 09/30/12 for AB exac > rx pred   10/09/2012 f/u ov/[name] cc doing ok until xmas with exposure to sick people while on acei and coreg with persistent  flare of ab since and sob some better p prednisone but still severe cough with min thick yellow mucus. Some better p albuterol. >changed coreg to bystolic , symbicort to dulera  And change ACEi to ARB   10/30/2012 Follow up  Patient returns for a two-week followup and medication review. Patient was seen last visit with persistent cough and wheezing. Patient was changed off of her ACE inhibitor her beta blocker Coreg was changed over to [employer]. The patient was changed from Symbicort to delay or a. Patient returns today reporting that she is much improved. Cough and wheezing are decreased essentially. She feels that she can take in a deep breath and has poor airflow. She does complain that she has sinus congestion, drainage, and sinus pain, and pressure with a sinus headache. Patient denies any fever or hemoptysis, orthopnea, PND, or leg swelling. Patient has brought her drug formulary in today for  several her medications are on tier 3 or 4  medications. She requests meds be changed to tier 1 or 2 to have more for affordability  rec Begin Losartan 100mg  daily (In place of Benicar)  Avoid ACE Inhibitors in future due to cough .  Begin Bisoprolol 10mg  daily ( in place Bystolic )  Continue on Dulera 2 puffs Twice daily  -brush/rinse /gargle after inhaler use.  Omnicef 300mg  Twice daily  For 10 days  Mucinex Twice daily  As needed  Congestion.  Fluids and rest.  Saline nasal rinses As needed       04/14/2014 NP Acute OV  Complains of increased cough, congestion, minimally productive. No fever,  Has trouble with itching and dry skin. Is worried her new heart meds are causing her to itch. Has been using tanning bed at home daily .  We discussed sun /tanning bed exposure.  rec Augmentin 875mg  Twice daily  For 10 days -take with food.  Mucinex DM Twice daily  As needed  Cough/congestion  Saline nasal rinses As needed   Prednisone taper over next week.  Restart Symbicort 2 puffs Twice daily  , rinse after use.  Hydromet 1 tsp every 6hr as needed. , may make you sleepy.  Follow up Shelley. Melvyn Novas  In 6 weeks and As needed      02/01/2016  f/u ov/[name] re: cough with mostly  upper airway features maint rx symb 802bid Chief Complaint  Patient  presents with  . Follow-up    Pt c/o mostly dry cough, some wheeze and tightness. Pt denies SOB/CP. Pt needs refills on Symbicort and Ventolin. Pt is also asking for Oxycodone.   doing fine until one week prior to OV severe cough > sob   Assoc with nasal congestion but no purulent secretions and has not needed increase saba yet   No obvious day to day or daytime variability or assoc   cp  or hb symptoms. No unusual exp hx or h/o childhood pna/ asthma or knowledge of premature birth.  Sleeping ok without nocturnal  or early am exacerbation  of respiratory  c/o's or need for noct saba. Also denies any obvious fluctuation of symptoms with weather or  environmental changes or other aggravating or alleviating factors except as outlined above   Current Medications, Allergies, Complete Past Medical History, Past Surgical History, Family History, and Social History were reviewed in [employer] record.  ROS  The following are not active complaints unless bolded sore throat, dysphagia, dental problems, itching, sneezing,  nasal congestion or excess/ purulent secretions, ear ache,   fever, chills, sweats, unintended wt loss, classically pleuritic or exertional cp, hemoptysis,  orthopnea pnd or leg swelling, presyncope, palpitations, abdominal pain, anorexia, nausea, vomiting, diarrhea  or change in bowel or bladder habits, change in stools or urine, dysuria,hematuria,  rash, arthralgias, visual complaints, headache, numbness, weakness or ataxia or problems with walking or coordination,  change in mood/affect or memory.              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  NECK: Supple w/ fair ROM; no JVD; normal carotid impulses w/o bruits; no thyromegaly or nodules palpated; no lymphadenopathy.  CHEST  Minimal bilateral insp /exp  rhonchi mostly transmitted from the upper airway  HEART: RRR, no m/r/g heard  ABDOMEN: Soft & nt; nml bowel sounds; no organomegaly or masses detected.  EXT: Warm bilat, no calf pain, edema, clubbing, pulses intact, arthritic changes in hands.  Skin , no rash, dry skin     Assessment & Plan:

## 2016-02-01 NOTE — Assessment & Plan Note (Addendum)
-   Spirometry 1989: FEV1 2.12 ( 71%) with ratio 82  - PFT's nl 12/28/2010 x ERV 22% - 02/01/2016  After extensive coaching HFA effectiveness =    90%  - 02/01/2016 added flutter valve  - NO  02/01/2016   = 9    DDX of  difficult airways management almost all start with A and  include Adherence, Ace Inhibitors, Acid Reflux, Active Sinus Disease, Alpha 1 Antitripsin deficiency, Anxiety masquerading as Airways dz,  ABPA,  Allergy(esp in young), Aspiration (esp in elderly), Adverse effects of meds,  Active smokers, A bunch of PE's (a small clot burden can't cause this syndrome unless there is already severe underlying pulm or vascular dz with poor reserve) plus two Bs  = Bronchiectasis and Beta blocker use..and one C= CHF   Adherence is always the initial "prime suspect" and is a multilayered concern that requires a "trust but verify" approach in every patient - starting with knowing how to use medications, especially inhalers, correctly, keeping up with refills and understanding the fundamental difference between maintenance and prns vs those medications only taken for a very short course and then stopped and not refilled.  - The proper method of use, as well as anticipated side effects, of a metered-dose inhaler are discussed and demonstrated to the patient. Improved effectiveness after extensive coaching during this visit to a level of approximately 90 % from a baseline of 75 %   ? Acid (or non-acid) GERD > always difficult to exclude as up to 75% of pts in some series report no assoc GI/ Heartburn symptoms> rec continue max (24h)  acid suppression and diet restrictions/ reviewed     ? Active sinus dz > augmentin x 10 days  ? Allergy > very unlikely allergic asthma with NO so low during flare so symb at 80 strength should be adequate for astham and rx upper airway/rhinitis with  Prednisone 10 mg take  4 each am x 2 days,   2 each am x 2 days,  1 each am x 2 days and stop   ? A bunch of PE's unlikely on  xarelto  I had an extended discussion with the patient reviewing all relevant studies completed to date and  lasting 15 to 20 minutes of a 25 minute visit    Each maintenance medication was reviewed in detail including most importantly the difference between maintenance and prns and under what circumstances the prns are to be triggered using an action plan format that is not reflected in the computer generated alphabetically organized AVS.    Please see instructions for details which were reviewed in writing and the patient given a copy highlighting the part that I personally wrote and discussed at today's ov.

## 2016-02-02 ENCOUNTER — Ambulatory Visit (HOSPITAL_COMMUNITY): Payer: Medicare Other

## 2016-02-02 NOTE — Assessment & Plan Note (Signed)
Target wt < 175 to get BMI <30 PFT's 12/28/2010 with ERV 22%  O/w nl (classic for obesity effects)  Body mass index is 44.93   Lab Results  Component Value Date   TSH 2.210 02/19/2014     Contributing to gerd tendency/ doe/reviewed the need and the process to achieve and maintain neg calorie balance > defer f/u primary care including intermittently monitoring thyroid status

## 2016-02-04 ENCOUNTER — Ambulatory Visit (HOSPITAL_COMMUNITY): Payer: Medicare Other | Admitting: Hematology & Oncology

## 2016-02-04 ENCOUNTER — Other Ambulatory Visit (HOSPITAL_COMMUNITY): Payer: Medicare Other

## 2016-02-08 ENCOUNTER — Encounter: Payer: Self-pay | Admitting: Pulmonary Disease

## 2016-02-08 ENCOUNTER — Encounter: Payer: Self-pay | Admitting: Internal Medicine

## 2016-02-08 ENCOUNTER — Ambulatory Visit (INDEPENDENT_AMBULATORY_CARE_PROVIDER_SITE_OTHER): Payer: Medicare Other | Admitting: Pulmonary Disease

## 2016-02-08 VITALS — BP 140/80 | HR 75 | Ht 65.0 in | Wt 250.2 lb

## 2016-02-08 DIAGNOSIS — G4733 Obstructive sleep apnea (adult) (pediatric): Secondary | ICD-10-CM

## 2016-02-08 DIAGNOSIS — J45991 Cough variant asthma: Secondary | ICD-10-CM | POA: Diagnosis not present

## 2016-02-08 MED ORDER — ALBUTEROL SULFATE HFA 108 (90 BASE) MCG/ACT IN AERS: 2.0000 | INHALATION_SPRAY | RESPIRATORY_TRACT | Status: DC | PRN

## 2016-02-08 MED ORDER — BUDESONIDE-FORMOTEROL FUMARATE 80-4.5 MCG/ACT IN AERO: INHALATION_SPRAY | RESPIRATORY_TRACT | Status: DC

## 2016-02-08 NOTE — Progress Notes (Signed)
   Subjective:    Patient ID: [name], female    DOB: 04-07-1951, 65 y.o.   MRN: [id number]  HPI  48 yowf quit smoking in 1986 with morbid obesity and  history of Asthma, OSA &  GERD.  She was diagnosed in 2013 with mild OSA, with an AHI of 15 events per hour. He subsequently underwent a CPAP titration study with an optimal pressure of 8 cm of water. She has been on CPAP since that time. She currently uses nasal pillows without significant mouth opening, and also has a heated humidifier.  She does have a chronic pain syndrome which results in disrupted sleep She has remstar plus c reflex 2013 model. AutoCPAP was loaned 09/2014 - But I do not see any download  She denies any snoring or excessive daytime somnolence or fatigue. She ambulates with a cane. She denies dryness of her nasal passages or any nasal burning. Download from her device shows excellent compliance on 8 cm she is 30 minutes ramp time.  She denies nocturnal wheezing or dyspnea. She is bothered by chronic back pain   Review of Systems neg for any significant sore throat, dysphagia, itching, sneezing, nasal congestion or excess/ purulent secretions, fever, chills, sweats, unintended wt loss, pleuritic or exertional cp, hempoptysis, orthopnea pnd or change in chronic leg swelling. Also denies presyncope, palpitations, heartburn, abdominal pain, nausea, vomiting, diarrhea or change in bowel or urinary habits, dysuria,hematuria, rash, arthralgias, visual complaints, headache, numbness weakness or ataxia.     Objective:   Physical Exam  Gen. Pleasant, obese, in no distress ENT - no lesions, no post nasal drip Neck: No JVD, no thyromegaly, no carotid bruits Lungs: no use of accessory muscles, no dullness to percussion, decreased without rales or rhonchi  Cardiovascular: Rhythm regular, heart sounds  normal, no murmurs or gallops, no peripheral edema Musculoskeletal: No deformities, no cyanosis or clubbing , no  tremors        Assessment & Plan:

## 2016-02-08 NOTE — Patient Instructions (Signed)
Your CPAP is set at 8 cm CPAP supplies will be renewed for a year

## 2016-02-08 NOTE — Assessment & Plan Note (Signed)
Refills on Symbicort

## 2016-02-08 NOTE — Assessment & Plan Note (Addendum)
Your CPAP is set at 8 cm CPAP supplies will be renewed for a year She had significant weight gain so might need a titration study in the future if she has breakthrough symptoms  Weight loss encouraged, compliance with goal of at least 4-6 hrs every night is the expectation. Advised against medications with sedative side effects Cautioned against driving when sleepy - understanding that sleepiness will vary on a day to day basis

## 2016-02-11 ENCOUNTER — Encounter (HOSPITAL_COMMUNITY): Payer: Self-pay | Admitting: Hematology & Oncology

## 2016-02-11 ENCOUNTER — Encounter (HOSPITAL_COMMUNITY): Payer: Medicare Other | Attending: Hematology & Oncology | Admitting: Hematology & Oncology

## 2016-02-11 ENCOUNTER — Encounter (HOSPITAL_COMMUNITY): Payer: Medicare Other

## 2016-02-11 VITALS — BP 136/53 | HR 69 | Temp 98.0°F | Wt 247.1 lb

## 2016-02-11 DIAGNOSIS — Z79811 Long term (current) use of aromatase inhibitors: Secondary | ICD-10-CM

## 2016-02-11 DIAGNOSIS — D509 Iron deficiency anemia, unspecified: Secondary | ICD-10-CM

## 2016-02-11 DIAGNOSIS — C50212 Malignant neoplasm of upper-inner quadrant of left female breast: Secondary | ICD-10-CM | POA: Diagnosis not present

## 2016-02-11 DIAGNOSIS — E538 Deficiency of other specified B group vitamins: Secondary | ICD-10-CM

## 2016-02-11 DIAGNOSIS — D649 Anemia, unspecified: Secondary | ICD-10-CM | POA: Insufficient documentation

## 2016-02-11 DIAGNOSIS — N183 Chronic kidney disease, stage 3 (moderate): Secondary | ICD-10-CM

## 2016-02-11 DIAGNOSIS — Z170 Estrogen receptor positive status [ER+]: Secondary | ICD-10-CM

## 2016-02-11 DIAGNOSIS — M8580 Other specified disorders of bone density and structure, unspecified site: Secondary | ICD-10-CM

## 2016-02-11 DIAGNOSIS — E559 Vitamin D deficiency, unspecified: Secondary | ICD-10-CM

## 2016-02-11 LAB — IRON AND TIBC
Iron: 63 ug/dL (ref 28–170)
SATURATION RATIOS: 17 % (ref 10.4–31.8)
TIBC: 375 ug/dL (ref 250–450)
UIBC: 312 ug/dL

## 2016-02-11 LAB — CBC WITH DIFFERENTIAL/PLATELET
BASOS PCT: 1 %
Basophils Absolute: 0 10*3/uL (ref 0.0–0.1)
Eosinophils Absolute: 0.1 10*3/uL (ref 0.0–0.7)
Eosinophils Relative: 2 %
HEMATOCRIT: 35.8 % — AB (ref 36.0–46.0)
HEMOGLOBIN: 11.4 g/dL — AB (ref 12.0–15.0)
LYMPHS PCT: 15 %
Lymphs Abs: 0.9 10*3/uL (ref 0.7–4.0)
MCH: 28.4 pg (ref 26.0–34.0)
MCHC: 31.8 g/dL (ref 30.0–36.0)
MCV: 89.1 fL (ref 78.0–100.0)
MONO ABS: 0.3 10*3/uL (ref 0.1–1.0)
MONOS PCT: 5 %
NEUTROS ABS: 4.6 10*3/uL (ref 1.7–7.7)
NEUTROS PCT: 77 %
Platelets: 343 10*3/uL (ref 150–400)
RBC: 4.02 MIL/uL (ref 3.87–5.11)
RDW: 16.4 % — AB (ref 11.5–15.5)
WBC: 5.9 10*3/uL (ref 4.0–10.5)

## 2016-02-11 LAB — FERRITIN: FERRITIN: 70 ng/mL (ref 11–307)

## 2016-02-11 MED ORDER — GABAPENTIN 300 MG PO CAPS: ORAL_CAPSULE | ORAL | Status: DC

## 2016-02-11 NOTE — Progress Notes (Signed)
[place] PROGRESS NOTE  Patient Care Team: [name], MD as PCP - General (Internal Medicine)  CHIEF COMPLAINTS/PURPOSE OF CONSULTATION:  Left Breast cancer, pT1c, pN0, M0 ER + (100%) PR+ (99%) Ki-67 45% HER-2/NEU by CISH Negative. Note that the tumor appears to show heterogeneity in regards to HER 2 expression. Oncotype Recurrence Score Result of 2, Low Risk Adjuvant XRT Negative Genetics Evaluation DEXA with osteopenia Iron deficiency Anemia, IV iron C-scope with Dr. Carlean Purl 10/2015    Breast cancer of upper-inner quadrant of left female breast ([place])   12/15/2014 Mammogram new 32m round nodule in left breast is indeterminate   12/17/2014 Imaging 6 mm lobulated nodule in the left breast is at low suspicion for malignancy. ultrasound guided biopsy recommended    HISTORY OF PRESENTING ILLNESS:  Shelley Ditty89y.o. 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 Oncotype score was low risk and she has completed adjuvant radiation. Final staging was stage I.  She also presents for follow-up of iron deficiency anemia. Much improved with IV iron. C scope was performed in February 2017.   Ms. BBuryreturns to the [place]avagetoday unaccompanied. She is in a wheelchair due to a pinched nerve.  She is on flexeril and oxycodone to treat her pinched nerve. She sees Dr. RNelva Bushat GBereaand remarks that it started a couple of weeks ago. She adds "I don't have any disc material in my back from the middle down, and it gets inflamed." She says she can't walk or get straightened up." "I think I was sweeping, but when I went to bed, I couldn't get up the next morning." She says the pain is running down her leg and driving her nuts.  Ms. BUrbanskinotes that she can't hardly walk due to her pinched nerve and feels bad about being in the wheelchair today.  She notes no problems with  her Arimidex. She is not on neurontin or lyrica.  She denies any problems with her breasts today, but would appreciate postponing the breast exam until she is not in pain from her pinched nerve.  She denies change in appetite, no nausea or vomiting. No BRBPR, dark or tarry stool.    MEDICAL HISTORY:  Past Medical History  Diagnosis Date  . Asthma     Spirometry 1989: FEV1 2.12 (71%) with ratio 82.  HFA 90% after coaching 11-09-10  . GERD (gastroesophageal reflux disease)     EGD with HH / esophagitis 06-30-93  . Osteoarthritis   . DDD (degenerative disc disease)   . Pelvic fracture ([place]   . Hiatal hernia   . Migraine   . Anxiety disorder   . Anemia   . Diverticulosis   . Adenomatous colon polyp 1993  . Atrial flutter ([place] chadsvasc of 3 (11/19/14)    Status post cardioversion 2-12 2013. s/p RFCA 5/13 with JAllred (coumadin and amio stopped)   . Essential hypertension, benign   . Pneumonia   . Fibromyalgia   . Lipoma of colon   . Internal and external hemorrhoids without complication   . Breast cancer ([place]     left  . PONV (postoperative nausea and vomiting)   . Sleep apnea     cpap  . Anxiety   . History of atrial fibrillation   . Acute respiratory failure ([place] 01/27/2015  . Acute renal failure ([place] 01/27/2015  . COPD (chronic obstructive pulmonary disease) (  [place])   . Hyperlipidemia   . Allergy   . Radiation     left breast cancer  . Personal history of colonic polyps 06/14/2010    SURGICAL HISTORY: Past Surgical History  Procedure Laterality Date  . Total knee arthroplasty      x2 09/2006  . Rotator cuff repair      bilateral  . A flutter ablation  02/22/2012  . Dilation and curettage of uterus    . Colonoscopy  05/2010, 07/2000  . Esophagogastroduodenoscopy  05/2010, 06/1993  . Cardioversion N/A 02/19/2014    Procedure: CARDIOVERSION;  Surgeon: Dorothy Spark, MD;  Location: [facility];  Service: Cardiovascular;  Laterality: N/A;  . Atrial flutter ablation N/A  02/22/2012    Procedure: ATRIAL FLUTTER ABLATION;  Surgeon: Thompson Grayer, MD;  Location: [facility] CATH LAB;  Service: Cardiovascular;  Laterality: N/A;  . Breast biopsy Left 11/2014  . Joint replacement    . Rotator cuff repair      bil  . Radioactive seed guided mastectomy with axillary sentinel lymph node biopsy Left 01/26/2015    Procedure: LEFT  PARTIAL MASTECTOMY WITH  RADIOACTIVE SEED LOCALIZATION, LEFT AXILLARY SENTINEL LYMPH NODE BIOPSY;  Surgeon: Fanny Skates, MD;  Location: [facility];  Service: General;  Laterality: Left;  . Polypectomy    . Upper gastrointestinal endoscopy      SOCIAL HISTORY: Social History   Social History  . Marital Status: Widowed    Spouse Name: N/A  . Number of Children: 0  . Years of Education: N/A   Occupational History  . Accounting tech at [employer] History Main Topics  . Smoking status: Former Smoker -- 1.00 packs/day for 20 years    Types: Cigarettes    Quit date: 09/25/1984  . Smokeless tobacco: Never Used  . Alcohol Use: No  . Drug Use: No  . Sexual Activity: Not Currently   Other Topics Concern  . 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 He has been widowed for 13 years. She was married for 1 year and 4 months and her husband past. She had been previously married and states she had infertility issues. She was a smoker and quit smoking 30 years ago. She notes she smoked a pack a day for 20 years.  FAMILY HISTORY: Family History  Problem Relation Age of Onset  . Emphysema Father     smoker  . Heart disease Father   . Breast cancer Mother 24  . Breast cancer Sister 21  . Heart Problems Sister   . Heart disease Sister   . Breast cancer Maternal Aunt 48  . Lung cancer Maternal Uncle 101    smoker  . Colon cancer Paternal Uncle 16  . Diabetes Maternal Grandmother   . Heart Problems Maternal  Grandfather   . Stroke Paternal Grandfather   . Lung cancer Maternal Uncle 72    smoker  . Heart Problems Paternal Uncle   . Diabetes Paternal Grandmother   . Rectal cancer Neg Hx   . Stomach cancer Neg Hx   . Esophageal cancer Neg Hx    indicated that her mother is deceased. She indicated that her father is deceased. She indicated that both of her sisters are alive. She indicated that her maternal grandmother is deceased. She indicated that her maternal grandfather is deceased.  She indicated that her paternal grandmother is deceased. She indicated that her paternal grandfather is deceased. She indicated that her maternal aunt is deceased. She indicated that both of her maternal uncles are deceased. She indicated that her paternal aunt is deceased. She indicated that both of her paternal uncles are deceased.  Her mother died at the age of 75 from pneumonia. Her mother had breast cancer at the age of 29. Her sister also had breast cancer at the age of 74. She died from heart disease at the age of 63 however. He states her sister did have genetic testing and was told it was negative. 2 brothers are deceased from lung cancer. Her father died at 41 from COPD. She has one sister who is living and healthy. Maternal Aunt had breast cancer at the age of 52.  ALLERGIES:  is allergic to shellfish allergy; ace inhibitors; epinephrine; theophyllines; and propoxyphene hcl.  MEDICATIONS:  Current Outpatient Prescriptions  Medication Sig Dispense Refill  . acetaminophen (TYLENOL) 500 MG tablet Take 1,000-1,500 mg by mouth 2 (two) times daily as needed for headache (migraines). Reported on 09/08/2015    . albuterol (PROVENTIL HFA;VENTOLIN HFA) 108 (90 Base) MCG/ACT inhaler Inhale 2 puffs into the lungs every 4 (four) hours as needed for wheezing or shortness of breath. 3 Inhaler 3  . ALPRAZolam (XANAX) 0.5 MG tablet Take 0.5 mg by mouth 2 (two) times daily as needed for anxiety.   0  . anastrozole (ARIMIDEX) 1  MG tablet Take 1 tablet (1 mg total) by mouth daily. 30 tablet 3  . atorvastatin (LIPITOR) 10 MG tablet Take 10 mg by mouth daily.  0  . bisoprolol (ZEBETA) 10 MG tablet Take 1 tablet (10 mg total) by mouth daily. 30 tablet 2  . budesonide-formoterol (SYMBICORT) 80-4.5 MCG/ACT inhaler inhale 2 puffs by mouth twice a day Rinse mouth after use 3 Inhaler 1  . calcium-vitamin D (OSCAL WITH D) 500-200 MG-UNIT per tablet Take 1 tablet by mouth daily with breakfast. 30 tablet 3  . celecoxib (CELEBREX) 200 MG capsule Take 1 capsule by mouth every 3-4 days  0  . cyanocobalamin (,VITAMIN B-12,) 1000 MCG/ML injection Inject 1 mL (1,000 mcg total) into the skin every 30 (thirty) days. 10 mL 1  . cyclobenzaprine (FLEXERIL) 10 MG tablet Take 1 tablet by mouth 3 (three) times daily. As needed  0  . diltiazem (CARDIZEM CD) 180 MG 24 hr capsule Take 1 capsule (180 mg total) by mouth daily. 90 capsule 0  . ergocalciferol (VITAMIN D2) 50000 units capsule Take 1 capsule weekly for 16 weeks then take OTC Vitamin D 2000 units daily 16 capsule 0  . flecainide (TAMBOCOR) 50 MG tablet Take 1 tablet (50 mg total) by mouth 2 (two) times daily. 180 tablet 3  . fluticasone (FLONASE) 50 MCG/ACT nasal spray Place 2 sprays into both nostrils daily as needed for allergies.   0  . furosemide (LASIX) 40 MG tablet Take 40 mg by mouth daily.  0  . hydrOXYzine (ATARAX/VISTARIL) 25 MG tablet Take 1 tablet by mouth every 6 (six) hours as needed. Itching  0  . losartan (COZAAR) 100 MG tablet Take 100 mg by mouth daily.   1  . Nystatin (NYAMYC) 100000 UNIT/GM POWD   1  . omeprazole (PRILOSEC) 40 MG capsule take 1 capsule by mouth once daily before BREAKFAST 90 capsule 3  . oxyCODONE (OXY IR/ROXICODONE) 5 MG immediate release tablet Take 1 tablet (5 mg total) by mouth every  4 (four) hours as needed for severe pain. 30 tablet 0  . oxyCODONE-acetaminophen (PERCOCET/ROXICET) 5-325 MG tablet Take 1 tablet by mouth 3 (three) times daily. As  needed  0  . PARoxetine (PAXIL) 20 MG tablet Take 20 mg by mouth daily.    . potassium chloride (K-DUR) 10 MEQ tablet Take 1 tablet by mouth daily.  0  . Respiratory Therapy Supplies (FLUTTER) DEVI USE AS DIRECTED 1 each 0  . rivaroxaban (XARELTO) 20 MG TABS tablet Take 1 tablet (20 mg total) by mouth daily with supper. 30 tablet 11   No current facility-administered medications for this visit.   Facility-Administered Medications Ordered in Other Visits  Medication Dose Route Frequency Provider Last Rate Last Dose  . 0.9 %  sodium chloride infusion   Intravenous Continuous Patrici Ranks, MD        ROS  Review of Systems  Constitutional: Negative for fever, chills, weight loss. HENT: Negative for congestion, hearing loss, nosebleeds, sore throat and tinnitus.   Eyes: Negative for blurred vision, double vision, pain and discharge.  Respiratory: Negative for cough, hemoptysis, sputum production, shortness of breath and wheezing.   Cardiovascular: Negative for chest pain, palpitations, claudication, leg swelling and PND.  Gastrointestinal: Negative for heartburn, nausea, vomiting, abdominal pain, diarrhea, constipation, blood in stool and melena.  Genitourinary: Negative for dysuria, urgency, frequency and hematuria.  Musculoskeletal: Negative for falls. Positive for joint and back pain Skin: Negative for itching and rash.  Neurological: Negative for dizziness, tingling, tremors, sensory change, speech change, focal weakness, seizures, loss of consciousness, weakness.  Endo/Heme/Allergies: Does not bruise/bleed easily.  Psychiatric/Behavioral: Negative for depression, suicidal ideas, memory loss and substance abuse. The patient is not nervous/anxious and does not have insomnia.   All other systems reviewed and are negative. 14 point review of systems was performed and is negative except as detailed under history of present illness and above    PHYSICAL EXAMINATION: ECOG PERFORMANCE  STATUS: 1 - Symptomatic but completely ambulatory  Filed Vitals:   02/11/16 1002  BP: 136/53  Pulse: 69  Temp: 98 F (36.7 C)   Filed Weights   02/11/16 1007  Weight: 247 lb 1.6 oz (112.084 kg)    Physical Exam  Constitutional: She is oriented to person, place, and time and well-developed, well-nourished, appears uncomfortable sitting in a wheelchair HENT:  Head: Normocephalic and atraumatic.  Nose: Nose normal.  Mouth/Throat: Oropharynx is clear and moist. No oropharyngeal exudate.  Eyes: Conjunctivae and EOM are normal.Right eye exhibits no discharge. Left eye exhibits no discharge. No scleral icterus.  Neck: Normal range of motion. Neck supple. No tracheal deviation present. No thyromegaly present.  Cardiovascular: Normal rate, regular rhythm and normal heart sounds.  Exam reveals no gallop and no friction rub.  No murmur heard. Pulmonary/Chest: Effort normal and breath sounds normal. She has no wheezes. She has no rales.  Abdominal: Soft. Bowel sounds are normal. She exhibits no distension and no mass. There is no tenderness. There is no rebound and no guarding.Exam performed with patient in upright position  Musculoskeletal: Normal range of motion. She exhibits no edema.      Mild pitting edema on the left leg, chronic Lymphadenopathy:    She has no cervical adenopathy.  Neurological: She is alert and oriented to person, place, and time.Marland Kitchen No cranial nerve deficit.  Skin: Skin is warm and dry. No rash noted.  Psychiatric: Mood, memory, affect and judgment normal.  Nursing note and vitals reviewed.   LABORATORY DATA:  I have reviewed the data as listed  CBC    Component Value Date/Time   WBC 5.9 02/11/2016 0940   RBC 4.02 02/11/2016 0940   RBC 3.54* 07/16/2015 1259   HGB 11.4* 02/11/2016 0940   HCT 35.8* 02/11/2016 0940   PLT 343 02/11/2016 0940   MCV 89.1 02/11/2016 0940   MCH 28.4 02/11/2016 0940   MCHC 31.8 02/11/2016 0940   RDW 16.4* 02/11/2016 0940    LYMPHSABS 0.9 02/11/2016 0940   MONOABS 0.3 02/11/2016 0940   EOSABS 0.1 02/11/2016 0940   BASOSABS 0.0 02/11/2016 0940   CMP     Component Value Date/Time   NA 138 01/20/2016 0956   K 4.3 01/20/2016 0956   CL 100* 01/20/2016 0956   CO2 28 01/20/2016 0956   GLUCOSE 132* 01/20/2016 0956   BUN 26* 01/20/2016 0956   CREATININE 1.37* 01/20/2016 0956   CALCIUM 9.3 01/20/2016 0956   PROT 7.8 01/20/2016 0956   ALBUMIN 3.8 01/20/2016 0956   AST 18 01/20/2016 0956   ALT 18 01/20/2016 0956   ALKPHOS 64 01/20/2016 0956   BILITOT 0.5 01/20/2016 0956   GFRNONAA 39* 01/20/2016 0956   GFRAA 45* 01/20/2016 0956   Results for AFREEN, SIEBELS (MRN [id number]) as of 02/11/2016 16:38  Ref. Range 07/02/2015 10:54 07/16/2015 12:59 11/08/2015 09:42 01/20/2016 09:56 02/11/2016 09:40  Hemoglobin Latest Ref Range: 12.0-15.0 g/dL 8.9 (L) 9.2 (L) 9.3 (L) 10.5 (L) 11.4 (L)    PATHOLOGIC STUDIES: FINAL DIAGNOSIS Diagnosis 1. Breast, lumpectomy, left - INVASIVE DUCTAL CARCINOMA, GRADE II/III, SPANNING 1.1 CM. - DUCTAL CARCINOMA IN SITU, LOW GRADE. - THE SURGICAL RESECTION MARGINS ARE NEGATIVE FOR CARCINOMA. - SEE ONCOLOGY TABLE BELOW. 2. Lymph node, sentinel, biopsy, left axillary #1 - THERE IS NO EVIDENCE OF CARCINOMA IN 1 OF 1 LYMPH NODE (0/1). 3. Lymph node, sentinel, biopsy, left axillary #2 - THERE IS NO EVIDENCE OF CARCINOMA IN 1 OF 1 LYMPH NODE (0/1). 4. Lymph node, sentinel, biopsy, left axillary #3 - THERE IS NO EVIDENCE OF CARCINOMA IN 1 OF 1 LYMPH NODE (0/1). Microscopic Comment 1. BREAST, INVASIVE TUMOR, WITH LYMPH NODES PRESENT Specimen, including laterality and lymph node sampling (sentinel, non-sentinel): Left breast and left axillary sentinel nodes. Procedure: Lumpectomy. Histologic type: Ductal Grade: II Tubule formation: 3 Nuclear pleomorphism: 2 Mitotic:1. Tumor size (gross measurement): 1.1 cm Margins: Negative for carcinoma. Invasive, distance to closest margin:  Greater than 1.0 cm to all margins. In-situ, distance to closest margin: Greater than 1.0 cm to all margins. Lymphovascular invasion: Not identified. Ductal carcinoma in situ: Present. Grade: Low grade. Extensive intraductal component: Not identified. Lobular neoplasia: Not identified. Tumor focality: Unifocal Treatment effect: N/A Extent of tumor: Confined to breast parenchyma. Lymph nodes: Examined: 3 Sentinel 0 Non-sentinel 3 Total Lymph nodes with metastasis: 0 Breast prognostic profile: [phone] Estrogen receptor: 100%, strong staining intensity. Progesterone receptor: 99% strong staining intensity. Her 2 neu: No amplification was detected. The ratio was 1.61. Her 2 neu by CISH will be repeated on the current case and the results reported separately. Ki-67: 45% Non-neoplastic breast: Fibrocystic changes with adenosis and healing biopsy site. TNM: pT1c, pN0. (JBK:gt, 01/27/15) Enid Cutter MD Pathologist, Electronic Signature (Case signed 01/27/2015) Specimen Gross and Clinical Information Specimen(s) Obtained: 1. Breast, lumpectomy, left 2. Lymph node, sentinel, biopsy, left axillary #1 3. Lymph node, sentinel, biopsy, left axillary #2 4. Lymph node, sentinel, biopsy, left axillary #3   ASSESSMENT & PLAN:  ER+ PR+ HER-2 Neu negative carcinoma L breast  pT1c, pN0, M0 Anemia Stage III CKD LLE cellulitis Oncotype Recurrence Score of 2, Low Risk OSTEOPENIA on DEXA 04/30/2015, prolia, calcium and vitamin D B12 deficiency on B12 replacement, administers at home IM Iron deficiency Vitamin D deficiency  She is to continue on calcium and vitamin D. She is to continue on her Arimidex therapy. She is tolerating it well without complaints. She has excellent dental care. She will continue on prolia.  She will be due for prolia again in October.   Anemia is much improved with iron and B12. She will continue with B12. (this is self administered) We will monitor ferritin moving  forward. She will be apprised of levels today when available. She has had a C-scope.   She has [facility]   I will write her neurontin to see if this helps with her nerve pain.  When she returns in 3 months, hopefully she's better and we can do a breast exam. We were supposed to do one today, but her pain is severe so we will postpone.  Orders Placed This Encounter  Procedures  . CBC with Differential    Standing Status: Future     Number of Occurrences:      Standing Expiration Date: 02/10/2017  . Comprehensive metabolic panel    Standing Status: Future     Number of Occurrences:      Standing Expiration Date: 02/10/2017  . Ferritin    Standing Status: Future     Number of Occurrences:      Standing Expiration Date: 02/10/2017    All questions were answered. The patient knows to call the clinic with any problems, questions or concerns.   This note was electronically signed.    This document serves as a record of services personally performed by Ancil Linsey, MD. It was created on her behalf by Toni Amend, a trained medical scribe. The creation of this record is based on the scribe's personal observations and the provider's statements to them. This document has been checked and approved by the attending provider.   I have reviewed the above documentation for accuracy and completeness and I agree with the above.  Kelby Fam. Whitney Muse, MD

## 2016-02-11 NOTE — Patient Instructions (Addendum)
[place] at [facility] Discharge Instructions  RECOMMENDATIONS MADE BY THE CONSULTANT AND ANY TEST RESULTS WILL BE SENT TO YOUR REFERRING PHYSICIAN.  Neurontin was called into [facility] for your nerve pain. If you have trouble with this medication please call We will see you back in 3 months. We will call with your iron levels  Thank you for choosing [facility] at [facility] to provide your oncology and hematology care.  To afford each patient quality time with our provider, please arrive at least 15 minutes before your scheduled appointment time.   Beginning January 23rd 2017 lab work for the [facility] will be done in the  Main lab at [facility] on 1st floor. If you have a lab appointment with the [facility] please come in thru the  Main Entrance and check in at the main information desk  You need to re-schedule your appointment should you arrive 10 or more minutes late.  We strive to give you quality time with our providers, and arriving late affects you and other patients whose appointments are after yours.  Also, if you no show three or more times for appointments you may be dismissed from the clinic at the providers discretion.     Again, thank you for choosing [facility].  Our hope is that these requests will decrease the amount of time that you wait before being seen by our physicians.       _____________________________________________________________  Should you have questions after your visit to [facility], please contact our office at [phone] between the hours of 8:30 a.m. and 4:30 p.m.  Voicemails left after 4:30 p.m. will not be returned until the following business day.  For prescription refill requests, have your pharmacy contact our office.         Resources For Cancer Patients and their Caregivers ? American Cancer Society: Can assist with transportation, wigs, general needs, runs  Look Good Feel Better.        539-366-5547 ? Cancer Care: Provides financial assistance, online support groups, medication/co-pay assistance.  1-800-813-HOPE (279)820-2750) ? [facility] Assists [place] cancer patients and their families through emotional , educational and financial support.  930-072-6737 ? [place] DSS Where to apply for food stamps, Medicaid and utility assistance. 438-722-5034 ? [facility]: Transportation to medical appointments. (956)831-0144 ? Social Security Administration: May apply for disability if have a Stage IV cancer. 251-448-3872 617 258 9100 ? [employer], Disability and Transit Services: Assists with nutrition, care and transit needs. [place] Support Programs: @10RELATIVEDAYS @ > Cancer Support Group  2nd Tuesday of the month 1pm-2pm, Journey Room  > Creative Journey  3rd Tuesday of the month 1130am-1pm, Journey Room  > Look Good Feel Better  1st Wednesday of the month 10am-12 noon, Journey Room (Call [place] to register [phone])

## 2016-02-14 ENCOUNTER — Encounter (HOSPITAL_BASED_OUTPATIENT_CLINIC_OR_DEPARTMENT_OTHER): Payer: Medicare Other

## 2016-02-14 ENCOUNTER — Other Ambulatory Visit (HOSPITAL_COMMUNITY): Payer: Self-pay | Admitting: Oncology

## 2016-02-14 VITALS — BP 132/51 | HR 58 | Temp 97.8°F | Resp 18

## 2016-02-14 DIAGNOSIS — D509 Iron deficiency anemia, unspecified: Secondary | ICD-10-CM | POA: Diagnosis not present

## 2016-02-14 MED ORDER — SODIUM CHLORIDE 0.9 % IV SOLN
510.0000 mg | Freq: Once | INTRAVENOUS | Status: AC
  Administered 2016-02-14: 510 mg via INTRAVENOUS
  Filled 2016-02-14: qty 17

## 2016-02-14 MED ORDER — SODIUM CHLORIDE 0.9 % IV SOLN
INTRAVENOUS | Status: DC
  Administered 2016-02-14: 14:00:00 via INTRAVENOUS

## 2016-02-14 NOTE — Progress Notes (Signed)
Patient tolerated infusion well.  VSS.   

## 2016-02-14 NOTE — Patient Instructions (Signed)
[facility] Discharge Instructions  RECOMMENDATIONS MADE BY THE CONSULTANT AND ANY TEST RESULTS WILL BE SENT TO YOUR REFERRING PHYSICIAN.  IV iron today.    Thank you for choosing [facility] at [facility] to provide your oncology and hematology care.  To afford each patient quality time with our provider, please arrive at least 15 minutes before your scheduled appointment time.   Beginning January 23rd 2017 lab work for the Cancer Center will be done in the  Main lab at [facility] on 1st floor. If you have a lab appointment with the Cancer Center please come in thru the  Main Entrance and check in at the main information desk  You need to re-schedule your appointment should you arrive 10 or more minutes late.  We strive to give you quality time with our providers, and arriving late affects you and other patients whose appointments are after yours.  Also, if you no show three or more times for appointments you may be dismissed from the clinic at the providers discretion.     Again, thank you for choosing [facility].  Our hope is that these requests will decrease the amount of time that you wait before being seen by our physicians.       _____________________________________________________________  Should you have questions after your visit to [facility], please contact our office at [phone] between the hours of 8:30 a.m. and 4:30 p.m.  Voicemails left after 4:30 p.m. will not be returned until the following business day.  For prescription refill requests, have your pharmacy contact our office.         Resources For Cancer Patients and their Caregivers ? American Cancer Society: Can assist with transportation, wigs, general needs, runs Look Good Feel Better.        1-888-227-6333 ? Cancer Care: Provides financial assistance, online support groups, medication/co-pay assistance.  1-800-813-HOPE  (4673) ? [facility] Assists [place] cancer patients and their families through emotional , educational and financial support.  336-427-4357 ? [place] DSS Where to apply for food stamps, Medicaid and utility assistance. 336-342-1394 ? [facility]: Transportation to medical appointments. 336-347-2287 ? Social Security Administration: May apply for disability if have a Stage IV cancer. 336-342-7796 1-800-772-1213 ? [place] Aging, Disability and Transit Services: Assists with nutrition, care and transit needs. 336-349-2343  Cancer Center Support Programs: @[email or web address]@ > Cancer Support Group  2nd Tuesday of the month 1pm-2pm, Journey Room  > Creative Journey  3rd Tuesday of the month 1130am-1pm, Journey Room  > Look Good Feel Better  1st Wednesday of the month 10am-12 noon, Journey Room (Call American Cancer Society to register 1-800-395-5775)    

## 2016-02-16 ENCOUNTER — Encounter: Payer: Self-pay | Admitting: Pulmonary Disease

## 2016-03-02 ENCOUNTER — Other Ambulatory Visit: Payer: Self-pay | Admitting: *Deleted

## 2016-03-02 DIAGNOSIS — I480 Paroxysmal atrial fibrillation: Secondary | ICD-10-CM

## 2016-03-02 MED ORDER — DILTIAZEM HCL ER COATED BEADS 180 MG PO CP24: 180.0000 mg | ORAL_CAPSULE | Freq: Every day | ORAL | Status: DC

## 2016-03-12 ENCOUNTER — Other Ambulatory Visit (HOSPITAL_COMMUNITY): Payer: Self-pay | Admitting: Hematology & Oncology

## 2016-03-27 IMAGING — CR DG CHEST 1V PORT
1 series · 1 of 1 positions shown · non-contrast
Comparison: Portable exam 5260 hr compared to 02/18/2014

CLINICAL DATA: Shortness of breath

EXAM:
PORTABLE CHEST - 1 VIEW

[AP]
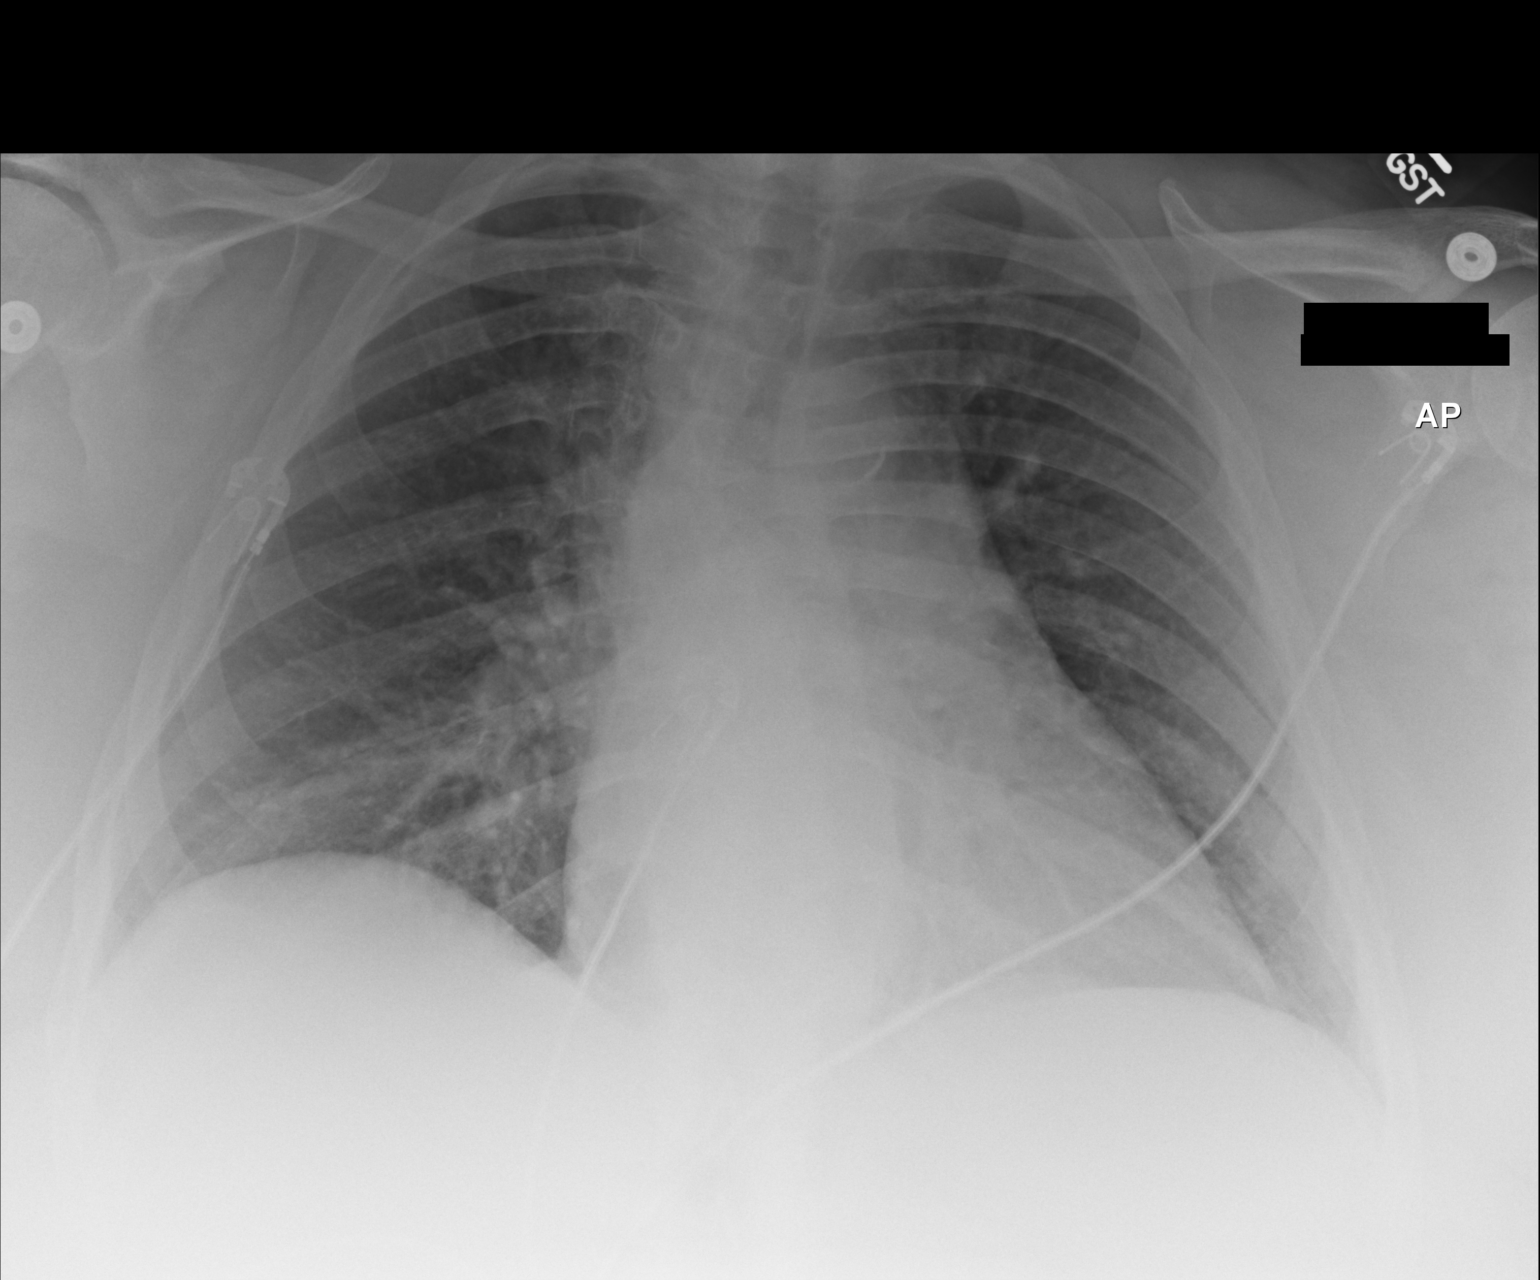

[1 of 1 positions shown; findings below may reference images not displayed]

FINDINGS: Enlargement of cardiac silhouette.

Mediastinal contours and pulmonary vascularity normal.

Minimal RIGHT base atelectasis.

Lungs otherwise clear.

No pleural effusion or pneumothorax.

Bones unremarkable.
IMPRESSION: Enlargement of cardiac silhouette with minimal RIGHT basilar
atelectasis.

No interval change.

## 2016-05-12 ENCOUNTER — Other Ambulatory Visit (HOSPITAL_COMMUNITY): Payer: Medicare Other

## 2016-05-12 ENCOUNTER — Ambulatory Visit (HOSPITAL_COMMUNITY): Payer: Medicare Other

## 2016-05-15 ENCOUNTER — Other Ambulatory Visit: Payer: Self-pay | Admitting: Internal Medicine

## 2016-05-15 DIAGNOSIS — I480 Paroxysmal atrial fibrillation: Secondary | ICD-10-CM

## 2016-05-24 ENCOUNTER — Other Ambulatory Visit: Payer: Self-pay | Admitting: Internal Medicine

## 2016-05-24 DIAGNOSIS — I480 Paroxysmal atrial fibrillation: Secondary | ICD-10-CM

## 2016-05-31 ENCOUNTER — Other Ambulatory Visit (HOSPITAL_COMMUNITY): Payer: Medicare Other

## 2016-06-05 ENCOUNTER — Ambulatory Visit (HOSPITAL_COMMUNITY): Payer: Medicare Other | Admitting: Hematology & Oncology

## 2016-06-05 ENCOUNTER — Other Ambulatory Visit (HOSPITAL_COMMUNITY): Payer: Medicare Other

## 2016-06-13 ENCOUNTER — Ambulatory Visit (HOSPITAL_COMMUNITY): Payer: Medicare Other | Admitting: Hematology & Oncology

## 2016-06-13 ENCOUNTER — Other Ambulatory Visit (HOSPITAL_COMMUNITY): Payer: Medicare Other

## 2016-06-16 ENCOUNTER — Other Ambulatory Visit: Payer: Self-pay | Admitting: Internal Medicine

## 2016-06-16 DIAGNOSIS — K219 Gastro-esophageal reflux disease without esophagitis: Secondary | ICD-10-CM

## 2016-06-19 ENCOUNTER — Inpatient Hospital Stay (HOSPITAL_COMMUNITY): Admission: RE | Admit: 2016-06-19 | Payer: Medicare Other | Source: Ambulatory Visit | Admitting: Nurse Practitioner

## 2016-06-20 ENCOUNTER — Other Ambulatory Visit: Payer: Self-pay | Admitting: Internal Medicine

## 2016-06-20 ENCOUNTER — Other Ambulatory Visit (HOSPITAL_COMMUNITY): Payer: Self-pay | Admitting: Oncology

## 2016-06-20 DIAGNOSIS — I480 Paroxysmal atrial fibrillation: Secondary | ICD-10-CM

## 2016-06-20 NOTE — Telephone Encounter (Signed)
flecainide (TAMBOCOR) 50 MG tablet  Medication  Date: 05/25/2016 Department: [address] Office Ordering/Authorizing: Thompson Grayer, MD  Order Providers   Prescribing Provider Encounter Provider  Thompson Grayer, MD Thompson Grayer, MD  Medication Detail    Disp Refills Start End   flecainide (TAMBOCOR) 50 MG tablet 60 tablet 0 05/25/2016    Sig - Route: Take 1 tablet (50 mg total) by mouth 2 (two) times daily. Please call and schedule a one year follow up appointment - Oral   E-Prescribing Status: Receipt confirmed by pharmacy (05/25/2016 9:10 AM EDT)   Associated Diagnoses   Paroxysmal atrial fibrillation ([place])     Pharmacy   [id number] [place]

## 2016-06-27 ENCOUNTER — Encounter (HOSPITAL_COMMUNITY): Payer: Self-pay | Admitting: Nurse Practitioner

## 2016-06-27 ENCOUNTER — Ambulatory Visit (HOSPITAL_COMMUNITY)
Admission: RE | Admit: 2016-06-27 | Discharge: 2016-06-27 | Disposition: A | Discharge status: Home / Self Care | Payer: Medicare Other | Source: Ambulatory Visit | Attending: Nurse Practitioner | Admitting: Nurse Practitioner

## 2016-06-27 DIAGNOSIS — I4891 Unspecified atrial fibrillation: Secondary | ICD-10-CM | POA: Diagnosis not present

## 2016-06-27 DIAGNOSIS — E785 Hyperlipidemia, unspecified: Secondary | ICD-10-CM | POA: Insufficient documentation

## 2016-06-27 DIAGNOSIS — G43909 Migraine, unspecified, not intractable, without status migrainosus: Secondary | ICD-10-CM | POA: Insufficient documentation

## 2016-06-27 DIAGNOSIS — I480 Paroxysmal atrial fibrillation: Secondary | ICD-10-CM | POA: Diagnosis not present

## 2016-06-27 DIAGNOSIS — M797 Fibromyalgia: Secondary | ICD-10-CM | POA: Diagnosis not present

## 2016-06-27 DIAGNOSIS — K449 Diaphragmatic hernia without obstruction or gangrene: Secondary | ICD-10-CM | POA: Insufficient documentation

## 2016-06-27 DIAGNOSIS — Z86010 Personal history of colonic polyps: Secondary | ICD-10-CM | POA: Diagnosis not present

## 2016-06-27 DIAGNOSIS — G4733 Obstructive sleep apnea (adult) (pediatric): Secondary | ICD-10-CM | POA: Insufficient documentation

## 2016-06-27 DIAGNOSIS — Z8249 Family history of ischemic heart disease and other diseases of the circulatory system: Secondary | ICD-10-CM | POA: Insufficient documentation

## 2016-06-27 DIAGNOSIS — Z87891 Personal history of nicotine dependence: Secondary | ICD-10-CM | POA: Insufficient documentation

## 2016-06-27 DIAGNOSIS — K219 Gastro-esophageal reflux disease without esophagitis: Secondary | ICD-10-CM | POA: Insufficient documentation

## 2016-06-27 DIAGNOSIS — I481 Persistent atrial fibrillation: Secondary | ICD-10-CM | POA: Diagnosis present

## 2016-06-27 DIAGNOSIS — Z91013 Allergy to seafood: Secondary | ICD-10-CM | POA: Diagnosis not present

## 2016-06-27 DIAGNOSIS — Z800 Family history of malignant neoplasm of digestive organs: Secondary | ICD-10-CM | POA: Insufficient documentation

## 2016-06-27 DIAGNOSIS — Z853 Personal history of malignant neoplasm of breast: Secondary | ICD-10-CM | POA: Diagnosis not present

## 2016-06-27 DIAGNOSIS — Z6841 Body Mass Index (BMI) 40.0 and over, adult: Secondary | ICD-10-CM | POA: Diagnosis not present

## 2016-06-27 DIAGNOSIS — Z7901 Long term (current) use of anticoagulants: Secondary | ICD-10-CM | POA: Insufficient documentation

## 2016-06-27 DIAGNOSIS — Z96659 Presence of unspecified artificial knee joint: Secondary | ICD-10-CM | POA: Insufficient documentation

## 2016-06-27 DIAGNOSIS — I10 Essential (primary) hypertension: Secondary | ICD-10-CM | POA: Diagnosis not present

## 2016-06-27 DIAGNOSIS — F419 Anxiety disorder, unspecified: Secondary | ICD-10-CM | POA: Diagnosis not present

## 2016-06-27 DIAGNOSIS — Z888 Allergy status to other drugs, medicaments and biological substances status: Secondary | ICD-10-CM | POA: Diagnosis not present

## 2016-06-27 DIAGNOSIS — I4892 Unspecified atrial flutter: Secondary | ICD-10-CM | POA: Insufficient documentation

## 2016-06-27 DIAGNOSIS — Z803 Family history of malignant neoplasm of breast: Secondary | ICD-10-CM | POA: Diagnosis not present

## 2016-06-27 DIAGNOSIS — Z833 Family history of diabetes mellitus: Secondary | ICD-10-CM | POA: Insufficient documentation

## 2016-06-27 DIAGNOSIS — M1990 Unspecified osteoarthritis, unspecified site: Secondary | ICD-10-CM | POA: Diagnosis not present

## 2016-06-27 DIAGNOSIS — Z801 Family history of malignant neoplasm of trachea, bronchus and lung: Secondary | ICD-10-CM | POA: Insufficient documentation

## 2016-06-27 DIAGNOSIS — J449 Chronic obstructive pulmonary disease, unspecified: Secondary | ICD-10-CM | POA: Diagnosis not present

## 2016-06-27 MED ORDER — FLECAINIDE ACETATE 50 MG PO TABS: 50.0000 mg | ORAL_TABLET | Freq: Two times a day (BID) | ORAL | 6 refills | Status: DC

## 2016-06-27 MED ORDER — RIVAROXABAN 20 MG PO TABS
ORAL_TABLET | ORAL | 6 refills | Status: DC
Start: 2016-06-27 — End: 2017-01-01

## 2016-06-27 NOTE — Progress Notes (Signed)
Primary Care Physician: [name], MD Primary Cardiologist: [place]  Primary Electrophysiologist: Kennedy Bucker is a 65 y.o. female with a h/o persistent atrial fibrillation who presents for follow up in the [facility].  She has been maintaining SR on Flecainide.  She reports compliance with Xarelto with no bleeding complications. She has been under increased stress recently with the death of her youngest sister. She has had some increased palpitations since that time but no sustained AF.  She fell 2 weeks ago when she tripped over a chair going out onto her patio. She is clear that she didn't have syncope or pre-syncope.  She has been using a walker since then.   Today, she denies symptoms of chest pain, shortness of breath, orthopnea, PND, lower extremity edema, dizziness, presyncope, syncope, snoring, daytime somnolence, bleeding, or neurologic sequela. The patient is tolerating medications without difficulties and is otherwise without complaint today.    Atrial Fibrillation Risk Factors:  she does have symptoms or diagnosis of sleep apnea. she is compliant with CPAP therapy.  she does not have a history of rheumatic fever.  she does not have a history of alcohol use.  she has a BMI of Body mass index is 44.08 kg/m.Marland Kitchen Filed Weights   06/27/16 0900  Weight: 264 lb 14.4 oz (120.2 kg)    LA size: 41     Past Medical History:  Diagnosis Date  . Acute renal failure ([place]) 01/27/2015  . Acute respiratory failure ([place]) 01/27/2015  . Adenomatous colon polyp 1993  . Allergy   . Anemia   . Anxiety   . Anxiety disorder   . Asthma    Spirometry 1989: FEV1 2.12 (71%) with ratio 82.  HFA 90% after coaching 11-09-10  . Atrial flutter ([place]) chadsvasc of 3 (11/19/14)   Status post cardioversion 2-12 2013. s/p RFCA 5/13 with JAllred (coumadin and amio stopped)   . Breast cancer ([place])    left  . COPD (chronic obstructive pulmonary disease)  ([place])   . DDD (degenerative disc disease)   . Diverticulosis   . Essential hypertension, benign   . Fibromyalgia   . GERD (gastroesophageal reflux disease)    EGD with HH / esophagitis 06-30-93  . Hiatal hernia   . History of atrial fibrillation   . Hyperlipidemia   . Internal and external hemorrhoids without complication   . Lipoma of colon   . Migraine   . Osteoarthritis   . Pelvic fracture ([place])   . Personal history of colonic polyps 06/14/2010  . Pneumonia   . PONV (postoperative nausea and vomiting)   . Radiation    left breast cancer  . Sleep apnea    cpap   Past Surgical History:  Procedure Laterality Date  . A FLUTTER ABLATION  02/22/2012  . ATRIAL FLUTTER ABLATION N/A 02/22/2012   Procedure: ATRIAL FLUTTER ABLATION;  Surgeon: Thompson Grayer, MD;  Location: [facility] CATH LAB;  Service: Cardiovascular;  Laterality: N/A;  . BREAST BIOPSY Left 11/2014  . CARDIOVERSION N/A 02/19/2014   Procedure: CARDIOVERSION;  Surgeon: Dorothy Spark, MD;  Location: [facility];  Service: Cardiovascular;  Laterality: N/A;  . COLONOSCOPY  05/2010, 07/2000  . DILATION AND CURETTAGE OF UTERUS    . ESOPHAGOGASTRODUODENOSCOPY  05/2010, 06/1993  . JOINT REPLACEMENT    . POLYPECTOMY    . RADIOACTIVE SEED GUIDED MASTECTOMY WITH AXILLARY SENTINEL LYMPH NODE BIOPSY Left 01/26/2015   Procedure: LEFT  PARTIAL MASTECTOMY WITH  RADIOACTIVE SEED LOCALIZATION, LEFT AXILLARY SENTINEL LYMPH NODE BIOPSY;  Surgeon: Fanny Skates, MD;  Location: [place];  Service: General;  Laterality: Left;  . ROTATOR CUFF REPAIR     bilateral  . ROTATOR CUFF REPAIR     bil  . TOTAL KNEE ARTHROPLASTY     x2 09/2006  . UPPER GASTROINTESTINAL ENDOSCOPY      Current Outpatient Prescriptions  Medication Sig Dispense Refill  . acetaminophen (TYLENOL) 500 MG tablet Take 1,000-1,500 mg by mouth 2 (two) times daily as needed for headache (migraines). Reported on 09/08/2015    . albuterol (PROVENTIL HFA;VENTOLIN HFA) 108 (90 Base) MCG/ACT  inhaler Inhale 2 puffs into the lungs every 4 (four) hours as needed for wheezing or shortness of breath. 3 Inhaler 3  . ALPRAZolam (XANAX) 0.5 MG tablet Take 0.5 mg by mouth 2 (two) times daily as needed for anxiety.   0  . anastrozole (ARIMIDEX) 1 MG tablet take 1 tablet by mouth once daily 30 tablet 3  . atorvastatin (LIPITOR) 10 MG tablet Take 10 mg by mouth daily.  0  . bisoprolol (ZEBETA) 10 MG tablet Take 1 tablet (10 mg total) by mouth daily. 30 tablet 2  . budesonide-formoterol (SYMBICORT) 80-4.5 MCG/ACT inhaler inhale 2 puffs by mouth twice a day Rinse mouth after use 3 Inhaler 1  . calcium-vitamin D (OSCAL WITH D) 500-200 MG-UNIT per tablet Take 1 tablet by mouth daily with breakfast. 30 tablet 3  . celecoxib (CELEBREX) 200 MG capsule Take 1 capsule by mouth every 3-4 days  0  . cyanocobalamin (,VITAMIN B-12,) 1000 MCG/ML injection Inject 1 mL (1,000 mcg total) into the skin every 30 (thirty) days. 10 mL 1  . cyclobenzaprine (FLEXERIL) 10 MG tablet Take 1 tablet by mouth 3 (three) times daily. As needed  0  . diltiazem (CARDIZEM CD) 180 MG 24 hr capsule Take 1 capsule (180 mg total) by mouth daily. 90 capsule 0  . flecainide (TAMBOCOR) 50 MG tablet Take 1 tablet (50 mg total) by mouth 2 (two) times daily. 60 tablet 6  . fluticasone (FLONASE) 50 MCG/ACT nasal spray Place 2 sprays into both nostrils daily as needed for allergies.   0  . furosemide (LASIX) 40 MG tablet Take 40 mg by mouth 3 (three) times a week.   0  . hydrOXYzine (ATARAX/VISTARIL) 25 MG tablet Take 1 tablet by mouth every 6 (six) hours as needed. Itching  0  . losartan (COZAAR) 100 MG tablet Take 100 mg by mouth daily.   1  . NON FORMULARY     . Nystatin (NYAMYC) 100000 UNIT/GM POWD   1  . omeprazole (PRILOSEC) 40 MG capsule take 1 capsule by mouth once daily BEFORE BREAKFAST 90 capsule 0  . oxyCODONE (OXY IR/ROXICODONE) 5 MG immediate release tablet Take 1 tablet (5 mg total) by mouth every 4 (four) hours as needed for  severe pain. 30 tablet 0  . oxyCODONE-acetaminophen (PERCOCET/ROXICET) 5-325 MG tablet Take 1 tablet by mouth 3 (three) times daily. As needed  0  . potassium chloride (K-DUR) 10 MEQ tablet Take 1 tablet by mouth daily.  0  . Respiratory Therapy Supplies (FLUTTER) DEVI USE AS DIRECTED 1 each 0  . rivaroxaban (XARELTO) 20 MG TABS tablet take 1 tablet by mouth once daily WITH SUPPER 30 tablet 6  . Vitamin D, Ergocalciferol, (DRISDOL) 50000 units CAPS capsule take 1 capsule by mouth every week for 16 WEEKS THEN TAKE OVER THE COUNTER VITAMIN D 2000 UNITS ONCE  DAILY THEREAFTER 16 capsule 0   No current facility-administered medications for this encounter.    Facility-Administered Medications Ordered in Other Encounters  Medication Dose Route Frequency Provider Last Rate Last Dose  . 0.9 %  sodium chloride infusion   Intravenous Continuous Patrici Ranks, MD        Allergies  Allergen Reactions  . Shellfish Allergy Hives and Shortness Of Breath  . Ace Inhibitors Cough  . Epinephrine Other (See Comments)    Pallpitations during dental procedures  . Theophyllines Nausea And Vomiting and Other (See Comments)    Heart races and pounds  . Propoxyphene Hcl Nausea And Vomiting    Social History   Social History  . Marital status: Widowed    Spouse name: N/A  . Number of children: 0  . Years of education: N/A   Occupational History  . Accounting tech at [employer] History Main Topics  . Smoking status: Former Smoker    Packs/day: 1.00    Years: 20.00    Types: Cigarettes    Quit date: 09/25/1984  . Smokeless tobacco: Never Used  . Alcohol use No  . Drug use: No  . Sexual activity: Not Currently   Other Topics Concern  . Not on file   Social History Narrative  . No narrative on file    Family History  Problem Relation Age of Onset  . Emphysema Father     smoker  . Heart disease Father   . Breast cancer Mother 34  . Breast cancer Sister 64  .  Heart Problems Sister   . Heart disease Sister   . Breast cancer Maternal Aunt 48  . Lung cancer Maternal Uncle 79    smoker  . Colon cancer Paternal Uncle 50  . Diabetes Maternal Grandmother   . Heart Problems Maternal Grandfather   . Stroke Paternal Grandfather   . Lung cancer Maternal Uncle 72    smoker  . Heart Problems Paternal Uncle   . Diabetes Paternal Grandmother   . Rectal cancer Neg Hx   . Stomach cancer Neg Hx   . Esophageal cancer Neg Hx     ROS- All systems are reviewed and negative except as per the HPI above.  Physical Exam: Vitals:   06/27/16 0900  BP: (!) 96/54  Pulse: 70  Weight: 264 lb 14.4 oz (120.2 kg)  Height: 5\' 5"  (1.651 m)    GEN- The patient is obese appearing, alert and oriented x 3 today.   Head- normocephalic, atraumatic Eyes-  Sclera clear, conjunctiva pink Ears- hearing intact Oropharynx- clear Neck- supple  Lungs- Clear to ausculation bilaterally, normal work of breathing Heart- Regular rate and rhythm, no murmurs, rubs or gallops  GI- soft, NT, ND, + BS Extremities- no clubbing, cyanosis, or edema MS- no significant deformity or atrophy Skin- no rash or lesion Psych- euthymic mood, full affect Neuro- strength and sensation are intact  EKG today demonstrates sinus rhythm, rate 70, PR 154msec, QRS 45msec Echo 01/2014 demonstrated EF 60-65%, no RWMA, LA 41  Epic records are reviewed at length today  Assessment and Plan:  1. Persistent atrial fibrillation Maintaining SR on Flecainide Continue Xarelto for CHADS2VASC of 3  2. Morbid obesity Body mass index is 44.08 kg/m. Lifestyle modification was discussed at length including regular exercise and weight reduction. Referred to nutrition class today   3. Obstructive sleep apnea The importance of adequate treatment of sleep apnea was discussed today in order  to improve our ability to maintain sinus rhythm long term. She reports compliance with CPAP   4.  HTN Stable No change  required today  Follow up in AF clinic in 6 months    Chanetta Marshall, NP 06/27/2016 10:11 AM

## 2016-06-30 ENCOUNTER — Other Ambulatory Visit: Payer: Self-pay | Admitting: Internal Medicine

## 2016-06-30 DIAGNOSIS — I480 Paroxysmal atrial fibrillation: Secondary | ICD-10-CM

## 2016-07-20 ENCOUNTER — Encounter (HOSPITAL_BASED_OUTPATIENT_CLINIC_OR_DEPARTMENT_OTHER): Payer: Medicare Other | Admitting: Hematology & Oncology

## 2016-07-20 ENCOUNTER — Other Ambulatory Visit (HOSPITAL_COMMUNITY): Payer: Medicare Other

## 2016-07-20 ENCOUNTER — Encounter (HOSPITAL_COMMUNITY): Payer: Medicare Other

## 2016-07-20 ENCOUNTER — Encounter (HOSPITAL_COMMUNITY): Payer: Self-pay | Admitting: Hematology & Oncology

## 2016-07-20 ENCOUNTER — Encounter (HOSPITAL_COMMUNITY): Payer: Medicare Other | Attending: Hematology & Oncology

## 2016-07-20 VITALS — BP 140/59 | HR 77 | Temp 98.3°F | Resp 20 | Wt 227.3 lb

## 2016-07-20 DIAGNOSIS — Z79811 Long term (current) use of aromatase inhibitors: Secondary | ICD-10-CM

## 2016-07-20 DIAGNOSIS — Z170 Estrogen receptor positive status [ER+]: Secondary | ICD-10-CM | POA: Diagnosis not present

## 2016-07-20 DIAGNOSIS — M8580 Other specified disorders of bone density and structure, unspecified site: Secondary | ICD-10-CM

## 2016-07-20 DIAGNOSIS — N189 Chronic kidney disease, unspecified: Secondary | ICD-10-CM

## 2016-07-20 DIAGNOSIS — C50212 Malignant neoplasm of upper-inner quadrant of left female breast: Secondary | ICD-10-CM | POA: Diagnosis not present

## 2016-07-20 DIAGNOSIS — D509 Iron deficiency anemia, unspecified: Secondary | ICD-10-CM

## 2016-07-20 DIAGNOSIS — D500 Iron deficiency anemia secondary to blood loss (chronic): Secondary | ICD-10-CM

## 2016-07-20 DIAGNOSIS — E559 Vitamin D deficiency, unspecified: Secondary | ICD-10-CM

## 2016-07-20 DIAGNOSIS — M545 Low back pain, unspecified: Secondary | ICD-10-CM

## 2016-07-20 DIAGNOSIS — E538 Deficiency of other specified B group vitamins: Secondary | ICD-10-CM

## 2016-07-20 DIAGNOSIS — G8929 Other chronic pain: Secondary | ICD-10-CM

## 2016-07-20 DIAGNOSIS — Z79899 Other long term (current) drug therapy: Secondary | ICD-10-CM

## 2016-07-20 LAB — CBC WITH DIFFERENTIAL/PLATELET
Basophils Absolute: 0 10*3/uL (ref 0.0–0.1)
Basophils Relative: 1 %
EOS ABS: 0.1 10*3/uL (ref 0.0–0.7)
EOS PCT: 1 %
HCT: 34 % — ABNORMAL LOW (ref 36.0–46.0)
HEMOGLOBIN: 10.7 g/dL — AB (ref 12.0–15.0)
LYMPHS ABS: 0.8 10*3/uL (ref 0.7–4.0)
Lymphocytes Relative: 12 %
MCH: 29.4 pg (ref 26.0–34.0)
MCHC: 31.5 g/dL (ref 30.0–36.0)
MCV: 93.4 fL (ref 78.0–100.0)
MONO ABS: 0.4 10*3/uL (ref 0.1–1.0)
MONOS PCT: 6 %
Neutro Abs: 5.7 10*3/uL (ref 1.7–7.7)
Neutrophils Relative %: 80 %
PLATELETS: 363 10*3/uL (ref 150–400)
RBC: 3.64 MIL/uL — ABNORMAL LOW (ref 3.87–5.11)
RDW: 14.8 % (ref 11.5–15.5)
WBC: 7.1 10*3/uL (ref 4.0–10.5)

## 2016-07-20 LAB — COMPREHENSIVE METABOLIC PANEL
ALK PHOS: 64 U/L (ref 38–126)
ALT: 22 U/L (ref 14–54)
ANION GAP: 10 (ref 5–15)
AST: 25 U/L (ref 15–41)
Albumin: 3.9 g/dL (ref 3.5–5.0)
BUN: 18 mg/dL (ref 6–20)
CALCIUM: 9.6 mg/dL (ref 8.9–10.3)
CO2: 27 mmol/L (ref 22–32)
Chloride: 98 mmol/L — ABNORMAL LOW (ref 101–111)
Creatinine, Ser: 1.28 mg/dL — ABNORMAL HIGH (ref 0.44–1.00)
GFR calc non Af Amer: 42 mL/min — ABNORMAL LOW (ref >60)
GFR, EST AFRICAN AMERICAN: 49 mL/min — AB (ref >60)
Glucose, Bld: 160 mg/dL — ABNORMAL HIGH (ref 65–99)
POTASSIUM: 3.5 mmol/L (ref 3.5–5.1)
SODIUM: 135 mmol/L (ref 135–145)
Total Bilirubin: 0.3 mg/dL (ref 0.3–1.2)
Total Protein: 8.1 g/dL (ref 6.5–8.1)

## 2016-07-20 LAB — FERRITIN: Ferritin: 154 ng/mL (ref 11–307)

## 2016-07-20 MED ORDER — OXYCODONE-ACETAMINOPHEN 5-325 MG PO TABS: 1.0000 | ORAL_TABLET | ORAL | 0 refills | Status: DC | PRN

## 2016-07-20 MED ORDER — ANASTROZOLE 1 MG PO TABS: 1.0000 mg | ORAL_TABLET | Freq: Every day | ORAL | 3 refills | Status: DC

## 2016-07-20 MED ORDER — SODIUM CHLORIDE 0.9 % IV SOLN: Freq: Once | INTRAVENOUS | Status: DC

## 2016-07-20 MED ORDER — DENOSUMAB 60 MG/ML ~~LOC~~ SOLN
60.0000 mg | Freq: Once | SUBCUTANEOUS | Status: AC
  Administered 2016-07-20: 60 mg via SUBCUTANEOUS
  Filled 2016-07-20: qty 1

## 2016-07-20 NOTE — Progress Notes (Signed)
Margretta Ditty presents today for injection per MD orders. Prolia 60mg  administered SQ in left Abdomen. Administration without incident. Patient tolerated well.

## 2016-07-20 NOTE — Progress Notes (Signed)
[facility] PROGRESS NOTE  Patient Care Team: [name], MD as PCP - General (Internal Medicine)  CHIEF COMPLAINTS/PURPOSE OF CONSULTATION:  Left Breast cancer, pT1c, pN0, M0 ER + (100%) PR+ (99%) Ki-67 45% HER-2/NEU by CISH Negative. Note that the tumor appears to show heterogeneity in regards to HER 2 expression. Oncotype Recurrence Score Result of 2, Low Risk Adjuvant XRT Negative Genetics Evaluation DEXA with osteopenia Iron deficiency Anemia, IV iron C-scope with Dr. Carlean Purl 10/2015    Breast cancer of upper-inner quadrant of left female breast ([place])   12/15/2014 Mammogram    new 70m round nodule in left breast is indeterminate      12/17/2014 Imaging    6 mm lobulated nodule in the left breast is at low suspicion for malignancy. ultrasound guided biopsy recommended       HISTORY OF PRESENTING ILLNESS:  [name]89y.o. 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   Patient is getting Prolia today.   Patient continues to experience severe lower back pain. She says she has tried injections and they have not alleviated her pain. Patient says she does not feel relief until she takes oxycodone and lays down. Often times, CDeannastates she has to sleep in an upright position.  She sees Dr. RNelva Bushat GWaKeeney She has not been to pain specialist. She notes that her back pain is her major limiting health problem.   Dr. VWoody Selleris her PCP.   Patient says she had a breast exam a month ago by her gynecologist. Mammogram was in April.  Patient needs refill for Arimidex. She takes it daily. She does not feel that it has made her back pain worse.   MEDICAL HISTORY:  Past Medical History:  Diagnosis Date  . Acute renal failure ([place] 01/27/2015  . Acute respiratory failure ([place] 01/27/2015  . Adenomatous colon polyp 1993  . Allergy   . Anemia   . Anxiety   . Anxiety disorder     . Asthma    Spirometry 1989: FEV1 2.12 (71%) with ratio 82.  HFA 90% after coaching 11-09-10  . Atrial flutter ([place] chadsvasc of 3 (11/19/14)   Status post cardioversion 2-12 2013. s/p RFCA 5/13 with JAllred (coumadin and amio stopped)   . Breast cancer ([facility] [place]    left  . COPD (chronic obstructive pulmonary disease) ([place]   . DDD (degenerative disc disease)   . Diverticulosis   . Essential hypertension, benign   . Fibromyalgia   . GERD (gastroesophageal reflux disease)    EGD with HH / esophagitis 06-30-93  . Hiatal hernia   . History of atrial fibrillation   . Hyperlipidemia   . Internal and external hemorrhoids without complication   . Lipoma of colon   . Migraine   . Osteoarthritis   . Pelvic fracture ([facility]   . Personal history of colonic polyps 06/14/2010  . Pneumonia   . PONV (postoperative nausea and vomiting)   . Radiation    left breast cancer  . Sleep apnea    cpap    SURGICAL HISTORY: Past Surgical History:  Procedure Laterality Date  . A FLUTTER ABLATION  02/22/2012  . ATRIAL FLUTTER ABLATION N/A 02/22/2012   Procedure: ATRIAL FLUTTER ABLATION;  Surgeon: JThompson Grayer MD;  [facility];  Service: Cardiovascular;  Laterality: N/A;  . BREAST BIOPSY Left 11/2014  . CARDIOVERSION N/A 02/19/2014   Procedure:  CARDIOVERSION;  Surgeon: Dorothy Spark, MD;  Location: [facility];  Service: Cardiovascular;  Laterality: N/A;  . COLONOSCOPY  05/2010, 07/2000  . DILATION AND CURETTAGE OF UTERUS    . ESOPHAGOGASTRODUODENOSCOPY  05/2010, 06/1993  . JOINT REPLACEMENT    . POLYPECTOMY    . RADIOACTIVE SEED GUIDED MASTECTOMY WITH AXILLARY SENTINEL LYMPH NODE BIOPSY Left 01/26/2015   Procedure: LEFT  PARTIAL MASTECTOMY WITH  RADIOACTIVE SEED LOCALIZATION, LEFT AXILLARY SENTINEL LYMPH NODE BIOPSY;  Surgeon: Fanny Skates, MD;  Location: [facility];  Service: General;  Laterality: Left;  . ROTATOR CUFF REPAIR     bilateral  . ROTATOR CUFF REPAIR     bil  . TOTAL KNEE ARTHROPLASTY      x2 09/2006  . UPPER GASTROINTESTINAL ENDOSCOPY      SOCIAL HISTORY: Social History   Social History  . Marital status: Widowed    Spouse name: N/A  . Number of children: 0  . Years of education: N/A   Occupational History  . Accounting tech at [employer] History Main Topics  . Smoking status: Former Smoker    Packs/day: 1.00    Years: 20.00    Types: Cigarettes    Quit date: 09/25/1984  . Smokeless tobacco: Never Used  . Alcohol use No  . Drug use: No  . Sexual activity: Not Currently   Other Topics Concern  . Not on file   Social History Narrative  . 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 He has been widowed for 13 years. She was married for 1 year and 4 months and her husband past. She had been previously married and states she had infertility issues. She was a smoker and quit smoking 30 years ago. She notes she smoked a pack a day for 20 years.  FAMILY HISTORY: Family History  Problem Relation Age of Onset  . Emphysema Father     smoker  . Heart disease Father   . Breast cancer Mother 54  . Breast cancer Sister 68  . Heart Problems Sister   . Heart disease Sister   . Breast cancer Maternal Aunt 48  . Lung cancer Maternal Uncle 14    smoker  . Colon cancer Paternal Uncle 37  . Diabetes Maternal Grandmother   . Heart Problems Maternal Grandfather   . Stroke Paternal Grandfather   . Lung cancer Maternal Uncle 72    smoker  . Heart Problems Paternal Uncle   . Diabetes Paternal Grandmother   . Rectal cancer Neg Hx   . Stomach cancer Neg Hx   . Esophageal cancer Neg Hx    indicated that her mother is deceased. She indicated that her father is deceased. She indicated that both of her sisters are alive. She indicated that her maternal grandmother is deceased. She indicated that her maternal grandfather is deceased. She indicated that her  paternal grandmother is deceased. She indicated that her paternal grandfather is deceased. She indicated that her maternal aunt is deceased. She indicated that both of her maternal uncles are deceased. She indicated that her paternal aunt is deceased. She indicated that both of her paternal uncles are deceased. She indicated that the status of her neg hx is unknown.   Her mother died at the age of 32 from pneumonia. Her mother had breast cancer at the age of 83. Her  sister also had breast cancer at the age of 28. She died from heart disease at the age of 75 however. He states her sister did have genetic testing and was told it was negative. 2 brothers are deceased from lung cancer. Her father died at 63 from COPD. She has one sister who is living and healthy. Maternal Aunt had breast cancer at the age of 27.  ALLERGIES:  is allergic to shellfish allergy; ace inhibitors; epinephrine; theophyllines; and propoxyphene hcl.  MEDICATIONS:  Current Outpatient Prescriptions  Medication Sig Dispense Refill  . acetaminophen (TYLENOL) 500 MG tablet Take 1,000-1,500 mg by mouth 2 (two) times daily as needed for headache (migraines). Reported on 09/08/2015    . albuterol (PROVENTIL HFA;VENTOLIN HFA) 108 (90 Base) MCG/ACT inhaler Inhale 2 puffs into the lungs every 4 (four) hours as needed for wheezing or shortness of breath. 3 Inhaler 3  . ALPRAZolam (XANAX) 0.5 MG tablet Take 0.5 mg by mouth 2 (two) times daily as needed for anxiety.   0  . anastrozole (ARIMIDEX) 1 MG tablet Take 1 tablet (1 mg total) by mouth daily. 30 tablet 3  . atorvastatin (LIPITOR) 10 MG tablet Take 10 mg by mouth daily.  0  . bisoprolol (ZEBETA) 10 MG tablet Take 1 tablet (10 mg total) by mouth daily. 30 tablet 2  . budesonide-formoterol (SYMBICORT) 80-4.5 MCG/ACT inhaler inhale 2 puffs by mouth twice a day Rinse mouth after use 3 Inhaler 1  . calcium-vitamin D (OSCAL WITH D) 500-200 MG-UNIT per tablet Take 1 tablet by mouth daily with  breakfast. 30 tablet 3  . CARTIA XT 180 MG 24 hr capsule take 1 capsule by mouth once daily 90 capsule 3  . celecoxib (CELEBREX) 200 MG capsule Take 1 capsule by mouth every 3-4 days  0  . cyanocobalamin (,VITAMIN B-12,) 1000 MCG/ML injection Inject 1 mL (1,000 mcg total) into the skin every 30 (thirty) days. 10 mL 1  . cyclobenzaprine (FLEXERIL) 10 MG tablet Take 1 tablet by mouth 3 (three) times daily. As needed  0  . flecainide (TAMBOCOR) 50 MG tablet Take 1 tablet (50 mg total) by mouth 2 (two) times daily. 60 tablet 6  . fluticasone (FLONASE) 50 MCG/ACT nasal spray Place 2 sprays into both nostrils daily as needed for allergies.   0  . furosemide (LASIX) 40 MG tablet Take 40 mg by mouth 3 (three) times a week.   0  . hydrOXYzine (ATARAX/VISTARIL) 25 MG tablet Take 1 tablet by mouth every 6 (six) hours as needed. Itching  0  . losartan (COZAAR) 100 MG tablet Take 100 mg by mouth daily.   1  . NON FORMULARY     . Nystatin (NYAMYC) 100000 UNIT/GM POWD   1  . omeprazole (PRILOSEC) 40 MG capsule take 1 capsule by mouth once daily BEFORE BREAKFAST 90 capsule 0  . oxyCODONE (OXY IR/ROXICODONE) 5 MG immediate release tablet Take 1 tablet (5 mg total) by mouth every 4 (four) hours as needed for severe pain. 30 tablet 0  . oxyCODONE-acetaminophen (PERCOCET/ROXICET) 5-325 MG tablet Take 1 tablet by mouth 3 (three) times daily. As needed  0  . potassium chloride (K-DUR) 10 MEQ tablet Take 1 tablet by mouth daily.  0  . Respiratory Therapy Supplies (FLUTTER) DEVI USE AS DIRECTED 1 each 0  . rivaroxaban (XARELTO) 20 MG TABS tablet take 1 tablet by mouth once daily WITH SUPPER 30 tablet 6  . Vitamin D, Ergocalciferol, (DRISDOL) 50000 units CAPS capsule take  1 capsule by mouth every week for 16 WEEKS THEN TAKE OVER THE COUNTER VITAMIN D 2000 UNITS ONCE DAILY THEREAFTER 16 capsule 0   No current facility-administered medications for this visit.    Facility-Administered Medications Ordered in Other Visits    Medication Dose Route Frequency Provider Last Rate Last Dose  . 0.9 %  sodium chloride infusion   Intravenous Continuous Shannon K Penland, MD      . 0.9 %  sodium chloride infusion   Intravenous Once Patrici Ranks, MD        ROS  Review of Systems  Constitutional: Negative for fever, chills, weight loss. HENT: Negative for congestion, hearing loss, nosebleeds, sore throat and tinnitus.   Eyes: Negative for blurred vision, double vision, pain and discharge.  Respiratory: Negative for cough, hemoptysis, sputum production, shortness of breath and wheezing.   Cardiovascular: Negative for chest pain, palpitations, claudication, leg swelling and PND.  Gastrointestinal: Negative for heartburn, nausea, vomiting, abdominal pain, diarrhea, constipation, blood in stool and melena.  Genitourinary: Negative for dysuria, urgency, frequency and hematuria.  Musculoskeletal: Negative for falls. Positive for joint and back pain Skin: Negative for itching and rash.  Neurological: Negative for dizziness, tingling, tremors, sensory change, speech change, focal weakness, seizures, loss of consciousness, weakness.  Endo/Heme/Allergies: Does not bruise/bleed easily.  Psychiatric/Behavioral: Negative for depression, suicidal ideas, memory loss and substance abuse. The patient is not nervous/anxious and does not have insomnia.   All other systems reviewed and are negative. 14 point review of systems was performed and is negative except as detailed under history of present illness and above   PHYSICAL EXAMINATION: ECOG PERFORMANCE STATUS: 1 - Symptomatic but completely ambulatory  Vitals:   07/20/16 1435  BP: (!) 140/59  Pulse: 77  Resp: 20  Temp: 98.3 F (36.8 C)   Filed Weights   07/20/16 1435  Weight: 227 lb 4.8 oz (103.1 kg)    Physical Exam  Constitutional: She is oriented to person, place, and time and well-developed, well-nourished, appears uncomfortable sitting in a wheelchair Able to get  to exam table with assistance HENT:  Head: Normocephalic and atraumatic.  Nose: Nose normal.  Mouth/Throat: Oropharynx is clear and moist. No oropharyngeal exudate.  Eyes: Conjunctivae and EOM are normal.Right eye exhibits no discharge. Left eye exhibits no discharge. No scleral icterus.  Neck: Normal range of motion. Neck supple. No tracheal deviation present. No thyromegaly present.  Cardiovascular: Normal rate, regular rhythm and normal heart sounds.  Exam reveals no gallop and no friction rub.  No murmur heard. Pulmonary/Chest: Effort normal and breath sounds normal. She has no wheezes. She has no rales.  Abdominal: Soft. Bowel sounds are normal. She exhibits no distension and no mass. There is no tenderness. There is no rebound and no guarding.Exam performed with patient in upright position  Musculoskeletal: Normal range of motion. She exhibits no edema.      Mild pitting edema on the left leg, chronic Lymphadenopathy:    She has no cervical adenopathy.  Neurological: She is alert and oriented to person, place, and time.Marland Kitchen No cranial nerve deficit.  Skin: Skin is warm and dry. No rash noted.  Psychiatric: Mood, memory, affect and judgment normal.  Nursing note and vitals reviewed.   LABORATORY DATA:  I have reviewed the data as listed  CBC    Component Value Date/Time   WBC 7.1 07/20/2016 1322   RBC 3.64 (L) 07/20/2016 1322   HGB 10.7 (L) 07/20/2016 1322   HCT 34.0 (L)  07/20/2016 1322   PLT 363 07/20/2016 1322   MCV 93.4 07/20/2016 1322   MCH 29.4 07/20/2016 1322   MCHC 31.5 07/20/2016 1322   RDW 14.8 07/20/2016 1322   LYMPHSABS 0.8 07/20/2016 1322   MONOABS 0.4 07/20/2016 1322   EOSABS 0.1 07/20/2016 1322   BASOSABS 0.0 07/20/2016 1322   CMP     Component Value Date/Time   NA 135 07/20/2016 1322   K 3.5 07/20/2016 1322   CL 98 (L) 07/20/2016 1322   CO2 27 07/20/2016 1322   GLUCOSE 160 (H) 07/20/2016 1322   BUN 18 07/20/2016 1322   CREATININE 1.28 (H) 07/20/2016  1322   CALCIUM 9.6 07/20/2016 1322   PROT 8.1 07/20/2016 1322   ALBUMIN 3.9 07/20/2016 1322   AST 25 07/20/2016 1322   ALT 22 07/20/2016 1322   ALKPHOS 64 07/20/2016 1322   BILITOT 0.3 07/20/2016 1322   GFRNONAA 42 (L) 07/20/2016 1322   GFRAA 49 (L) 07/20/2016 1322  Results for TENESIA, ESCUDERO (MRN [id number]) as of 07/20/2016 14:39  Ref. Range 01/20/2016 09:56 02/01/2016 09:37 02/11/2016 09:40 06/27/2016 08:54 07/20/2016 13:22  Hemoglobin Latest Ref Range: 12.0 - 15.0 g/dL 10.5 (L)  11.4 (L)  10.7 (L)   PATHOLOGIC STUDIES: FINAL DIAGNOSIS Diagnosis 1. Breast, lumpectomy, left - INVASIVE DUCTAL CARCINOMA, GRADE II/III, SPANNING 1.1 CM. - DUCTAL CARCINOMA IN SITU, LOW GRADE. - THE SURGICAL RESECTION MARGINS ARE NEGATIVE FOR CARCINOMA. - SEE ONCOLOGY TABLE BELOW. 2. Lymph node, sentinel, biopsy, left axillary #1 - THERE IS NO EVIDENCE OF CARCINOMA IN 1 OF 1 LYMPH NODE (0/1). 3. Lymph node, sentinel, biopsy, left axillary #2 - THERE IS NO EVIDENCE OF CARCINOMA IN 1 OF 1 LYMPH NODE (0/1). 4. Lymph node, sentinel, biopsy, left axillary #3 - THERE IS NO EVIDENCE OF CARCINOMA IN 1 OF 1 LYMPH NODE (0/1). Microscopic Comment 1. BREAST, INVASIVE TUMOR, WITH LYMPH NODES PRESENT Specimen, including laterality and lymph node sampling (sentinel, non-sentinel): Left breast and left axillary sentinel nodes. Procedure: Lumpectomy. Histologic type: Ductal Grade: II Tubule formation: 3 Nuclear pleomorphism: 2 Mitotic:1. Tumor size (gross measurement): 1.1 cm Margins: Negative for carcinoma. Invasive, distance to closest margin: Greater than 1.0 cm to all margins. In-situ, distance to closest margin: Greater than 1.0 cm to all margins. Lymphovascular invasion: Not identified. Ductal carcinoma in situ: Present. Grade: Low grade. Extensive intraductal component: Not identified. Lobular neoplasia: Not identified. Tumor focality: Unifocal Treatment effect: N/A Extent of tumor: Confined  to breast parenchyma. Lymph nodes: Examined: 3 Sentinel 0 Non-sentinel 3 Total Lymph nodes with metastasis: 0 Breast prognostic profile: [phone] Estrogen receptor: 100%, strong staining intensity. Progesterone receptor: 99% strong staining intensity. Her 2 neu: No amplification was detected. The ratio was 1.61. Her 2 neu by CISH will be repeated on the current case and the results reported separately. Ki-67: 45% Non-neoplastic breast: Fibrocystic changes with adenosis and healing biopsy site. TNM: pT1c, pN0. (JBK:gt, 01/27/15) Enid Cutter MD Pathologist, Electronic Signature (Case signed 01/27/2015) Specimen Gross and Clinical Information Specimen(s) Obtained: 1. Breast, lumpectomy, left 2. Lymph node, sentinel, biopsy, left axillary #1 3. Lymph node, sentinel, biopsy, left axillary #2 4. 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 She is to continue on her Arimidex therapy. She is tolerating it well without complaints. She has excellent dental care. She will continue on prolia.  She will be due for prolia again in October.   Anemia is much improved with iron and B12. She will continue with B12. (this is self administered) We will monitor ferritin moving forward. She will be apprised of levels today when available. She has had a C-scope.   She [facility]   We have referred her to Dr. Clydell Hakim for additional consultation regarding her back pain.   I have refilled patient's oxycodone and arimidex.  She is up to date on breast exam, mammogram, and dexa.   Follow up  with the patient in three months.   Meds ordered this encounter  Medications  . anastrozole (ARIMIDEX) 1 MG tablet    Sig: Take 1 tablet (1 mg total) by mouth daily.    Dispense:  30 tablet    Refill:  3  . oxyCODONE-acetaminophen (PERCOCET/ROXICET) 5-325 MG tablet    Sig: Take 1 tablet by mouth every 4 (four) hours as needed for severe pain. As needed    Dispense:  60 tablet    Refill:  0     All questions were answered. The patient knows to call the clinic with any problems, questions or concerns.   This note was electronically signed.    This document serves as a record of services personally performed by Ancil Linsey, MD. It was created on her behalf by Elmyra Ricks, a trained medical scribe. The creation of this record is based on the scribe's personal observations and the provider's statements to them. This document has been checked and approved by the attending provider.   I have reviewed the above documentation for accuracy and completeness and I agree with the above.  Kelby Fam. Whitney Muse, MD

## 2016-07-20 NOTE — Patient Instructions (Signed)
[facility] Discharge Instructions  RECOMMENDATIONS MADE BY THE CONSULTANT AND ANY TEST RESULTS WILL BE SENT TO YOUR REFERRING PHYSICIAN.  Prolia given today. Follow up as scheduled  Thank you for choosing  Cancer Center at [facility] to provide your oncology and hematology care.  To afford each patient quality time with our provider, please arrive at least 15 minutes before your scheduled appointment time.   Beginning January 23rd 2017 lab work for the Cancer Center will be done in the  Main lab at [facility] on 1st floor. If you have a lab appointment with the Cancer Center please come in thru the  Main Entrance and check in at the main information desk  You need to re-schedule your appointment should you arrive 10 or more minutes late.  We strive to give you quality time with our providers, and arriving late affects you and other patients whose appointments are after yours.  Also, if you no show three or more times for appointments you may be dismissed from the clinic at the providers discretion.     Again, thank you for choosing [facility].  Our hope is that these requests will decrease the amount of time that you wait before being seen by our physicians.       _____________________________________________________________  Should you have questions after your visit to  Cancer Center, please contact our office at [phone] between the hours of 8:30 a.m. and 4:30 p.m.  Voicemails left after 4:30 p.m. will not be returned until the following business day.  For prescription refill requests, have your pharmacy contact our office.         Resources For Cancer Patients and their Caregivers ? American Cancer Society: Can assist with transportation, wigs, general needs, runs Look Good Feel Better.        1-888-227-6333 ? Cancer Care: Provides financial assistance, online support groups, medication/co-pay  assistance.  1-800-813-HOPE (4673) ? [facility] Assists [place] cancer patients and their families through emotional , educational and financial support.  336-427-4357 ? [place] DSS Where to apply for food stamps, Medicaid and utility assistance. 336-342-1394 ? [facility]: Transportation to medical appointments. 336-347-2287 ? Social Security Administration: May apply for disability if have a Stage IV cancer. 336-342-7796 1-800-772-1213 ? [place] Aging, Disability and Transit Services: Assists with nutrition, care and transit needs. 336-349-2343  Cancer Center Support Programs: @10RELATIVEDAYS@ > Cancer Support Group  2nd Tuesday of the month 1pm-2pm, Journey Room  > Creative Journey  3rd Tuesday of the month 1130am-1pm, Journey Room  > Look Good Feel Better  1st Wednesday of the month 10am-12 noon, Journey Room (Call American Cancer Society to register 1-800-395-5775)   

## 2016-07-20 NOTE — Patient Instructions (Addendum)
[place] at [facility] Discharge Instructions  RECOMMENDATIONS MADE BY THE CONSULTANT AND ANY TEST RESULTS WILL BE SENT TO YOUR REFERRING PHYSICIAN.  You saw Dr.Penland today. Follow up in 4 months. See Amy at checkout for appointments.  Thank you for choosing [place] at [facility] to provide your oncology and hematology care.  To afford each patient quality time with our provider, please arrive at least 15 minutes before your scheduled appointment time.   Beginning January 23rd 2017 lab work for the [facility] will be done in the  Main lab at [facility] on 1st floor. If you have a lab appointment with the [facility] please come in thru the  Main Entrance and check in at the main information desk  You need to re-schedule your appointment should you arrive 10 or more minutes late.  We strive to give you quality time with our providers, and arriving late affects you and other patients whose appointments are after yours.  Also, if you no show three or more times for appointments you may be dismissed from the clinic at the providers discretion.     Again, thank you for choosing [facility].  Our hope is that these requests will decrease the amount of time that you wait before being seen by our physicians.       _____________________________________________________________  Should you have questions after your visit to [facility], please contact our office at [phone] between the hours of 8:30 a.m. and 4:30 p.m.  Voicemails left after 4:30 p.m. will not be returned until the following business day.  For prescription refill requests, have your pharmacy contact our office.         Resources For Cancer Patients and their Caregivers ? American Cancer Society: Can assist with transportation, wigs, general needs, runs Look Good Feel Better.        (313)472-1801 ? Cancer Care: Provides financial  assistance, online support groups, medication/co-pay assistance.  1-800-813-HOPE (639)211-1844) ? [place] Assists [place] cancer patients and their families through emotional , educational and financial support.  316-260-1772 ? [place] DSS Where to apply for food stamps, Medicaid and utility assistance. 3175385236 ? [facility]: Transportation to medical appointments. 5090855759 ? Social Security Administration: May apply for disability if have a Stage IV cancer. (402)593-0975 514-603-5142 ? [employer], Disability and Transit Services: Assists with nutrition, care and transit needs. [place] Support Programs: @10RELATIVEDAYS @ > Cancer Support Group  2nd Tuesday of the month 1pm-2pm, Journey Room  > Creative Journey  3rd Tuesday of the month 1130am-1pm, Journey Room  > Look Good Feel Better  1st Wednesday of the month 10am-12 noon, Journey Room (Call [place] to register [phone])

## 2016-08-09 ENCOUNTER — Ambulatory Visit: Payer: Medicare Other | Admitting: Pulmonary Disease

## 2016-08-18 ENCOUNTER — Encounter (HOSPITAL_COMMUNITY): Payer: Self-pay | Admitting: Hematology & Oncology

## 2016-08-30 ENCOUNTER — Other Ambulatory Visit (HOSPITAL_COMMUNITY): Payer: Self-pay | Admitting: Hematology & Oncology

## 2016-09-20 ENCOUNTER — Other Ambulatory Visit: Payer: Self-pay | Admitting: Internal Medicine

## 2016-09-20 DIAGNOSIS — K219 Gastro-esophageal reflux disease without esophagitis: Secondary | ICD-10-CM

## 2016-09-22 ENCOUNTER — Ambulatory Visit: Payer: Medicare Other | Admitting: Pulmonary Disease

## 2016-10-02 ENCOUNTER — Ambulatory Visit (INDEPENDENT_AMBULATORY_CARE_PROVIDER_SITE_OTHER)
Admission: RE | Admit: 2016-10-02 | Discharge: 2016-10-02 | Disposition: A | Discharge status: Home / Self Care | Payer: Medicare Other | Source: Ambulatory Visit | Attending: Pulmonary Disease | Admitting: Pulmonary Disease

## 2016-10-02 ENCOUNTER — Ambulatory Visit (INDEPENDENT_AMBULATORY_CARE_PROVIDER_SITE_OTHER): Payer: Medicare Other | Admitting: Pulmonary Disease

## 2016-10-02 ENCOUNTER — Encounter: Payer: Self-pay | Admitting: Pulmonary Disease

## 2016-10-02 VITALS — BP 140/76 | HR 73 | Ht 65.0 in | Wt 269.6 lb

## 2016-10-02 DIAGNOSIS — J45991 Cough variant asthma: Secondary | ICD-10-CM

## 2016-10-02 DIAGNOSIS — G4733 Obstructive sleep apnea (adult) (pediatric): Secondary | ICD-10-CM | POA: Diagnosis not present

## 2016-10-02 MED ORDER — PREDNISONE 10 MG PO TABS: ORAL_TABLET | ORAL | 0 refills | Status: DC

## 2016-10-02 MED ORDER — DOXYCYCLINE HYCLATE 100 MG PO TABS: 100.0000 mg | ORAL_TABLET | Freq: Two times a day (BID) | ORAL | 0 refills | Status: DC

## 2016-10-02 MED ORDER — ALBUTEROL SULFATE 108 (90 BASE) MCG/ACT IN AEPB
2.0000 | INHALATION_SPRAY | RESPIRATORY_TRACT | 0 refills | Status: DC | PRN
Start: 2016-10-02 — End: 2016-12-25

## 2016-10-02 NOTE — Patient Instructions (Signed)
We will start on doxycycline 100 mg 2 times daily for 7 days We will prednisone taper starting at 40 mg. Today's dose by 10 mg every 3 days. We will schedule for chest x-ray today. We will also give you an albuterol rescue inhaler to be used up to 4 times daily.  Follow-up with Dr. Melvyn Novas.

## 2016-10-02 NOTE — Progress Notes (Signed)
[name]    [id number]    05/27/1951  Primary Care Physician:Dhruv Barbette Reichmann, MD  Referring Physician: [name], MD [place], [place] [address]  Chief complaint:  Acute visit for cough  HPI: 66 year old with history of left breast cancer status post mastectomy in 2003, cough variant asthma, OSA, morbid obesity, atrial fibrillation on Xarelto. Complains of cough with mucus production, dyspnea, wheezing the past 1 week. She took a course of amoxicillin for 5 days with improvement in symptoms. She was maintained on Symbicort but has stopped taking it 5 days ago. She has received a flu shot this year. She does not report any sick contacts at home.  Outpatient Encounter Prescriptions as of 10/02/2016  Medication Sig  . acetaminophen (TYLENOL) 500 MG tablet Take 1,000-1,500 mg by mouth 2 (two) times daily as needed for headache (migraines). Reported on 09/08/2015  . albuterol (PROVENTIL HFA;VENTOLIN HFA) 108 (90 Base) MCG/ACT inhaler Inhale 2 puffs into the lungs every 4 (four) hours as needed for wheezing or shortness of breath.  . ALPRAZolam (XANAX) 0.5 MG tablet Take 0.5 mg by mouth 2 (two) times daily as needed for anxiety.   Marland Kitchen anastrozole (ARIMIDEX) 1 MG tablet Take 1 tablet (1 mg total) by mouth daily.  Marland Kitchen atorvastatin (LIPITOR) 10 MG tablet Take 10 mg by mouth daily.  . bisoprolol (ZEBETA) 10 MG tablet Take 1 tablet (10 mg total) by mouth daily.  . budesonide-formoterol (SYMBICORT) 80-4.5 MCG/ACT inhaler inhale 2 puffs by mouth twice a day Rinse mouth after use  . calcium-vitamin D (OSCAL WITH D) 500-200 MG-UNIT per tablet Take 1 tablet by mouth daily with breakfast.  . CARTIA XT 180 MG 24 hr capsule take 1 capsule by mouth once daily  . celecoxib (CELEBREX) 200 MG capsule Take 1 capsule by mouth every 3-4 days  . cyanocobalamin (,VITAMIN B-12,) 1000 MCG/ML injection Inject 1 mL (1,000 mcg total) into the skin every 30 (thirty) days.  . cyclobenzaprine  (FLEXERIL) 10 MG tablet Take 1 tablet by mouth 3 (three) times daily. As needed  . fluticasone (FLONASE) 50 MCG/ACT nasal spray Place 2 sprays into both nostrils daily as needed for allergies.   . furosemide (LASIX) 40 MG tablet Take 40 mg by mouth 3 (three) times a week.   . hydrOXYzine (ATARAX/VISTARIL) 25 MG tablet Take 1 tablet by mouth every 6 (six) hours as needed. Itching  . losartan (COZAAR) 100 MG tablet Take 100 mg by mouth daily.   Marland Kitchen omeprazole (PRILOSEC) 40 MG capsule take 1 capsule by mouth once daily before BREAKFAST  . potassium chloride (K-DUR) 10 MEQ tablet Take 1 tablet by mouth daily.  Marland Kitchen Respiratory Therapy Supplies (FLUTTER) DEVI USE AS DIRECTED  . rivaroxaban (XARELTO) 20 MG TABS tablet take 1 tablet by mouth once daily WITH SUPPER  . Vitamin D, Ergocalciferol, (DRISDOL) 50000 units CAPS capsule take 1 capsule by mouth every week FOR 16 WEEKS THEN TAKE THE OTC VIT D 2000 UNITS ONCE DAILY THEREAFTER  . flecainide (TAMBOCOR) 50 MG tablet Take 1 tablet (50 mg total) by mouth 2 (two) times daily. (Patient not taking: Reported on 10/02/2016)  . NON FORMULARY   . Nystatin (NYAMYC) 100000 UNIT/GM POWD   . oxyCODONE (OXY IR/ROXICODONE) 5 MG immediate release tablet Take 1 tablet (5 mg total) by mouth every 4 (four) hours as needed for severe pain. (Patient not taking: Reported on 10/02/2016)  . oxyCODONE-acetaminophen (PERCOCET/ROXICET) 5-325 MG tablet  Take 1 tablet by mouth every 4 (four) hours as needed for severe pain. As needed (Patient not taking: Reported on 10/02/2016)   Facility-Administered Encounter Medications as of 10/02/2016  Medication  . 0.9 %  sodium chloride infusion    Allergies as of 10/02/2016 - Review Complete 10/02/2016  Allergen Reaction Noted  . Shellfish allergy Hives and Shortness Of Breath 03/21/2014  . Ace inhibitors Cough 10/30/2012  . Epinephrine Other (See Comments) 10/03/2007  . Theophyllines Nausea And Vomiting and Other (See Comments) 02/20/2012  .  Propoxyphene hcl Nausea And Vomiting 10/03/2007    Past Medical History:  Diagnosis Date  . Acute renal failure ([place]) 01/27/2015  . Acute respiratory failure ([place]) 01/27/2015  . Adenomatous colon polyp 1993  . Allergy   . Anemia   . Anxiety   . Anxiety disorder   . Asthma    Spirometry 1989: FEV1 2.12 (71%) with ratio 82.  HFA 90% after coaching 11-09-10  . Atrial flutter ([place]) chadsvasc of 3 (11/19/14)   Status post cardioversion 2-12 2013. s/p RFCA 5/13 with JAllred (coumadin and amio stopped)   . Breast cancer ([place])    left  . COPD (chronic obstructive pulmonary disease) ([place])   . DDD (degenerative disc disease)   . Diverticulosis   . Essential hypertension, benign   . Fibromyalgia   . GERD (gastroesophageal reflux disease)    EGD with HH / esophagitis 06-30-93  . Hiatal hernia   . History of atrial fibrillation   . Hyperlipidemia   . Internal and external hemorrhoids without complication   . Lipoma of colon   . Migraine   . Osteoarthritis   . Pelvic fracture ([place])   . Personal history of colonic polyps 06/14/2010  . Pneumonia   . PONV (postoperative nausea and vomiting)   . Radiation    left breast cancer  . Sleep apnea    cpap    Past Surgical History:  Procedure Laterality Date  . A FLUTTER ABLATION  02/22/2012  . ATRIAL FLUTTER ABLATION N/A 02/22/2012   Procedure: ATRIAL FLUTTER ABLATION;  Surgeon: Thompson Grayer, MD;  Location: [facility] CATH LAB;  Service: Cardiovascular;  Laterality: N/A;  . BREAST BIOPSY Left 11/2014  . CARDIOVERSION N/A 02/19/2014   Procedure: CARDIOVERSION;  Surgeon: Dorothy Spark, MD;  Location: [facility];  Service: Cardiovascular;  Laterality: N/A;  . COLONOSCOPY  05/2010, 07/2000  . DILATION AND CURETTAGE OF UTERUS    . ESOPHAGOGASTRODUODENOSCOPY  05/2010, 06/1993  . JOINT REPLACEMENT    . POLYPECTOMY    . RADIOACTIVE SEED GUIDED MASTECTOMY WITH AXILLARY SENTINEL LYMPH NODE BIOPSY Left 01/26/2015   Procedure: LEFT  PARTIAL MASTECTOMY WITH  RADIOACTIVE  SEED LOCALIZATION, LEFT AXILLARY SENTINEL LYMPH NODE BIOPSY;  Surgeon: Fanny Skates, MD;  Location: [facility];  Service: General;  Laterality: Left;  . ROTATOR CUFF REPAIR     bilateral  . ROTATOR CUFF REPAIR     bil  . TOTAL KNEE ARTHROPLASTY     x2 09/2006  . UPPER GASTROINTESTINAL ENDOSCOPY      Family History  Problem Relation Age of Onset  . Emphysema Father     smoker  . Heart disease Father   . Breast cancer Mother 49  . Breast cancer Sister 79  . Heart Problems Sister   . Heart disease Sister   . Breast cancer Maternal Aunt 48  . Lung cancer Maternal Uncle 5    smoker  . Colon cancer Paternal Uncle 109  . Diabetes Maternal  Grandmother   . Heart Problems Maternal Grandfather   . Stroke Paternal Grandfather   . Lung cancer Maternal Uncle 72    smoker  . Heart Problems Paternal Uncle   . Diabetes Paternal Grandmother   . Rectal cancer Neg Hx   . Stomach cancer Neg Hx   . Esophageal cancer Neg Hx     Social History   Social History  . Marital status: Widowed    Spouse name: N/A  . Number of children: 0  . Years of education: N/A   Occupational History  . 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 Smoking status: Former Smoker    Packs/day: 1.00    Years: 20.00    Types: Cigarettes    Quit date: 09/25/1984  . Smokeless tobacco: Never Used  . Alcohol use No  . Drug use: No  . Sexual activity: Not Currently   Other Topics Concern  . Not on file   Social History Narrative  . No narrative on file   Review of systems: Review of Systems  Constitutional: Negative for fever and chills.  HENT: Negative.   Eyes: Negative for blurred vision.  Respiratory: as per HPI  Cardiovascular: Negative for chest pain and palpitations.  Gastrointestinal: Negative for vomiting, diarrhea, blood per rectum. Genitourinary: Negative for dysuria, urgency, frequency and hematuria.  Musculoskeletal: Negative for myalgias, back pain and  joint pain.  Skin: Negative for itching and rash.  Neurological: Negative for dizziness, tremors, focal weakness, seizures and loss of consciousness.  Endo/Heme/Allergies: Negative for environmental allergies.  Psychiatric/Behavioral: Negative for depression, suicidal ideas and hallucinations.  All other systems reviewed and are negative.  Physical Exam: Blood pressure 140/76, pulse 73, height 5\' 5"  (1.651 m), weight 269 lb 9.6 oz (122.3 kg), SpO2 96 %. Gen:      No acute distress HEENT:  EOMI, sclera anicteric Neck:     No masses; no thyromegaly Lungs:    Clear to auscultation bilaterally; normal respiratory effort CV:         Regular rate and rhythm; no murmurs Abd:      + bowel sounds; soft, non-tender; no palpable masses, no distension Ext:    No edema; adequate peripheral perfusion Skin:      Warm and dry; no rash Neuro: alert and oriented x 3 Psych: normal mood and affect  Data Reviewed: PFTs. 12/28/10 FVC 2.10 [67%) FEV1 1.67 (72%)  F/F 80 TLC 77% DLCO 71% No obstructive lung defect, mild restriction, mild reduction in diffusion capacity.  BMET    Component Value Date/Time   NA 135 07/20/2016 1322   K 3.5 07/20/2016 1322   CL 98 (L) 07/20/2016 1322   CO2 27 07/20/2016 1322   GLUCOSE 160 (H) 07/20/2016 1322   BUN 18 07/20/2016 1322   CREATININE 1.28 (H) 07/20/2016 1322   CALCIUM 9.6 07/20/2016 1322   GFRNONAA 42 (L) 07/20/2016 1322   GFRAA 49 (L) 07/20/2016 1322   CBC    Component Value Date/Time   WBC 7.1 07/20/2016 1322   RBC 3.64 (L) 07/20/2016 1322   HGB 10.7 (L) 07/20/2016 1322   HCT 34.0 (L) 07/20/2016 1322   PLT 363 07/20/2016 1322   MCV 93.4 07/20/2016 1322   MCH 29.4 07/20/2016 1322   MCHC 31.5 07/20/2016 1322   RDW 14.8 07/20/2016 1322   LYMPHSABS 0.8 07/20/2016 1322   MONOABS 0.4 07/20/2016 1322   EOSABS 0.1 07/20/2016 1322   BASOSABS 0.0  07/20/2016 1322   Assessment:  Acute visit for bronchitis, asthma exacerbation History of cough variant  asthma with upper airway predominance We will treat with doxycycline and a prednisone taper. She'll also get scheduled for a chest x-ray. She is currently not on inhalers as she was instructed to stop Symbicort during acute exacerbation. I'll give her an albuterol rescue inhaler for now and she'll resume the Symbicort as she gets better. Suspicion for flu is low and there is no evidence to treat as she has symptoms for over a week.  Plan/Recommendations: - Doxy, pred taper - CXR - Albuterol inhaler.  - Mucinex DM OTC  Marshell Garfinkel MD [facility] Pager [phone] 10/02/2016, 11:33 AM  CC: [name], MD

## 2016-10-16 ENCOUNTER — Telehealth: Payer: Self-pay | Admitting: Pulmonary Disease

## 2016-10-16 NOTE — Telephone Encounter (Signed)
lmtcb X1  Notes Recorded by Marshell Garfinkel, MD on 10/05/2016 at 10:42 AM EST Please let the patient know that the CXR does not show any infection or infiltrate in the lung. There is some scarring at the apex and mild atelectasis at the base of lung that is not of major concern. There mild enlargement of the heart. Ask her to follow up with her PCP for further evaluation of the heart. Thanks.

## 2016-10-17 NOTE — Progress Notes (Signed)
See 1.22.18 phone note

## 2016-10-17 NOTE — Telephone Encounter (Signed)
Called and spoke to pt. Informed her of the results and recs per PM. Pt verbalized understanding and denied any further questions or concerns at this time.  

## 2016-10-23 ENCOUNTER — Ambulatory Visit: Payer: Medicare Other | Admitting: Pulmonary Disease

## 2016-11-22 ENCOUNTER — Ambulatory Visit (HOSPITAL_COMMUNITY): Payer: Medicare Other | Admitting: Adult Health

## 2016-11-28 ENCOUNTER — Ambulatory Visit: Payer: Medicare Other | Admitting: Pulmonary Disease

## 2016-11-29 ENCOUNTER — Other Ambulatory Visit (HOSPITAL_COMMUNITY): Payer: Self-pay | Admitting: Hematology & Oncology

## 2016-12-14 ENCOUNTER — Other Ambulatory Visit: Payer: Self-pay | Admitting: Internal Medicine

## 2016-12-14 DIAGNOSIS — K219 Gastro-esophageal reflux disease without esophagitis: Secondary | ICD-10-CM

## 2016-12-17 NOTE — Progress Notes (Deleted)
[name], [place] [address]   CLINIC:  Medical Oncology/Hematology  PCP:  [name], MD [place] [phone]   REASON FOR VISIT:  Follow-up for Stage IA left breast invasive ductal carcinoma, ER+/PR+/HER2- AND Osteopenia AND Iron deficiency anemia   CURRENT THERAPY: Anastrozole daily AND Prolia every 6 months AND IV iron prn    BRIEF ONCOLOGIC HISTORY:    Breast cancer of upper-inner quadrant of left female breast ([place])   12/15/2014 Mammogram    new 40m round nodule in left breast is indeterminate      12/17/2014 Imaging    6 mm lobulated nodule in the left breast is at low suspicion for malignancy. ultrasound guided biopsy recommended      12/23/2014 Procedure    [facility] In[facility].  ER+ (100%), PR+ (99%), HER2 neg (ratio 1.61). Ki67 45%.       01/26/2015 Surgery    (L) lumpectomy with SLNB (Dalbert Batman: IDC, grade 2, spanning 1.1 cm; also low-grade DCIS; margins negative. 0/3 left axillary SLN. HER2 repeated and remains neg. (ratio 1.33).  pT1c, pN0: Stage IA      03/19/2015 Genetic Testing    Genetic testing: Negative. Genes analyzed: ATM, BARD1, BRCA1, BRCA2, BRIP1, CDH1, CHEK2, FANCC, MLH1, MSH2, MSH6, NBN, PALB2, PMS2, PTEN, RAD51C, RAD51D, STK11, TP53, and XRCC2.  Additionally, this panel includes deletion/duplication analysis (without next-generation sequencing) of one gene, EPCAM.      03/2015 - 04/2015 Radiation Therapy    Adjuvant breast radiation with Dr. WPablo Ledger       04/30/2015 Imaging    DEXA scan: Osteopenia. (T-Score of -1.1)      05/2015 -  Anti-estrogen oral therapy    Anastrozole. Planned duration of therapy 5-10 years.       07/16/2015 Miscellaneous    Prolia injections started.         HISTORY OF PRESENT ILLNESS:  ***   INTERVAL HISTORY:    IV iron administration record:  Oncology Flowsheet 08/03/2015  ferumoxytol ([facility] IV 510  mg   Oncology Flowsheet 12/15/2015  ferumoxytol ([facility] IV 510 mg   Oncology Flowsheet 02/14/2016  ferumoxytol (FERAHEME) IV 510 mg    REVIEW OF SYSTEMS:  Review of Systems - Oncology   PAST MEDICAL/SURGICAL HISTORY:  Past Medical History:  Diagnosis Date  . Acute renal failure ([facility] 01/27/2015  . Acute respiratory failure ([facility] 01/27/2015  . Adenomatous colon polyp 1993  . Allergy   . Anemia   . Anxiety   . Anxiety disorder   . Asthma    Spirometry 1989: FEV1 2.12 (71%) with ratio 82.  HFA 90% after coaching 11-09-10  . Atrial flutter ([place] ch[place]vasc of 3 (11/19/14)   Status post cardioversion 2-12 2013. s/p RFCA 5/13 with JAllred (coumadin and amio stopped)   . Breast cancer ([place]    left  . COPD (chronic obstructive pulmonary disease) ([facility] [place]   . DDD (degenerative disc disease)   . Diverticulosis   . Essential hypertension, benign   . Fibromyalgia   . GERD (gastroesophageal reflux disease)    EGD with HH / esophagitis 06-30-93  . Hiatal hernia   . History of atrial fibrillation   . Hyperlipidemia   . Internal and external hemorrhoids without complication   . Lipoma of colon   . Migraine   . Osteoarthritis   . Pelvic fracture ([place]   . Personal history of colonic polyps 06/14/2010  .  Pneumonia   . PONV (postoperative nausea and vomiting)   . Radiation    left breast cancer  . Sleep apnea    cpap   Past Surgical History:  Procedure Laterality Date  . A FLUTTER ABLATION  02/22/2012  . ATRIAL FLUTTER ABLATION N/A 02/22/2012   Procedure: ATRIAL FLUTTER ABLATION;  Surgeon: Thompson Grayer, MD;  [facility] CATH LAB;  Service: Cardiovascular;  Laterality: N/A;  . BREAST BIOPSY Left 11/2014  . CARDIOVERSION N/A 02/19/2014   Procedure: CARDIOVERSION;  Surgeon: Dorothy Spark, MD;  Location: [place];  Service: Cardiovascular;  Laterality: N/A;  . COLONOSCOPY  05/2010, 07/2000  . DILATION AND CURETTAGE OF UTERUS    . ESOPHAGOGASTRODUODENOSCOPY  05/2010, 06/1993  . JOINT  REPLACEMENT    . POLYPECTOMY    . RADIOACTIVE SEED GUIDED MASTECTOMY WITH AXILLARY SENTINEL LYMPH NODE BIOPSY Left 01/26/2015   Procedure: LEFT  PARTIAL MASTECTOMY WITH  RADIOACTIVE SEED LOCALIZATION, LEFT AXILLARY SENTINEL LYMPH NODE BIOPSY;  Surgeon: Fanny Skates, MD;  Location: [place];  Service: General;  Laterality: Left;  . ROTATOR CUFF REPAIR     bilateral  . ROTATOR CUFF REPAIR     bil  . TOTAL KNEE ARTHROPLASTY     x2 09/2006  . UPPER GASTROINTESTINAL ENDOSCOPY       SOCIAL HISTORY:  Social History   Social History  . Marital status: Widowed    Spouse name: N/A  . Number of children: 0  . Years of education: N/A   Occupational History  . 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 Smoking status: Former Smoker    Packs/day: 1.00    Years: 20.00    Types: Cigarettes    Quit date: 09/25/1984  . Smokeless tobacco: Never Used  . Alcohol use No  . Drug use: No  . Sexual activity: Not Currently   Other Topics Concern  . Not on file   Social History Narrative  . No narrative on file    FAMILY HISTORY:  Family History  Problem Relation Age of Onset  . Emphysema Father     smoker  . Heart disease Father   . Breast cancer Mother 44  . Breast cancer Sister 30  . Heart Problems Sister   . Heart disease Sister   . Breast cancer Maternal Aunt 48  . Lung cancer Maternal Uncle 85    smoker  . Colon cancer Paternal Uncle 22  . Diabetes Maternal Grandmother   . Heart Problems Maternal Grandfather   . Stroke Paternal Grandfather   . Lung cancer Maternal Uncle 72    smoker  . Heart Problems Paternal Uncle   . Diabetes Paternal Grandmother   . Rectal cancer Neg Hx   . Stomach cancer Neg Hx   . Esophageal cancer Neg Hx     CURRENT MEDICATIONS:  Outpatient Encounter Prescriptions as of 12/18/2016  Medication Sig Note  . acetaminophen (TYLENOL) 500 MG tablet Take 1,000-1,500 mg by mouth 2 (two) times daily as needed for  headache (migraines). Reported on 09/08/2015   . albuterol (PROVENTIL HFA;VENTOLIN HFA) 108 (90 Base) MCG/ACT inhaler Inhale 2 puffs into the lungs every 4 (four) hours as needed for wheezing or shortness of breath.   . Albuterol Sulfate (PROAIR RESPICLICK) 937 (90 Base) MCG/ACT AEPB Inhale 2 puffs into the lungs every 4 (four) hours as needed.   . ALPRAZolam (XANAX) 0.5 MG tablet Take 0.5 mg by mouth 2 (two) times  daily as needed for anxiety.  02/19/2015: Received from: External Pharmacy  . anastrozole (ARIMIDEX) 1 MG tablet Take 1 tablet (1 mg total) by mouth daily.   Marland Kitchen atorvastatin (LIPITOR) 10 MG tablet Take 10 mg by mouth daily. 06/03/2015: Received from: External Pharmacy  . bisoprolol (ZEBETA) 10 MG tablet Take 1 tablet (10 mg total) by mouth daily.   . budesonide-formoterol (SYMBICORT) 80-4.5 MCG/ACT inhaler inhale 2 puffs by mouth twice a day Rinse mouth after use   . calcium-vitamin D (OSCAL WITH D) 500-200 MG-UNIT per tablet Take 1 tablet by mouth daily with breakfast.   . CARTIA XT 180 MG 24 hr capsule take 1 capsule by mouth once daily   . celecoxib (CELEBREX) 200 MG capsule Take 1 capsule by mouth every 3-4 days 04/26/2015: Received from: External Pharmacy Received Sig:   . cyanocobalamin (,VITAMIN B-12,) 1000 MCG/ML injection Inject 1 mL (1,000 mcg total) into the skin every 30 (thirty) days.   . cyclobenzaprine (FLEXERIL) 10 MG tablet Take 1 tablet by mouth 3 (three) times daily. As needed 02/01/2016: Received from: External Pharmacy Received Sig: take 1 tablet by mouth three times a day if needed for SPASMS  . doxycycline (VIBRA-TABS) 100 MG tablet Take 1 tablet (100 mg total) by mouth 2 (two) times daily.   . flecainide (TAMBOCOR) 50 MG tablet Take 1 tablet (50 mg total) by mouth 2 (two) times daily. (Patient not taking: Reported on 10/02/2016)   . fluticasone (FLONASE) 50 MCG/ACT nasal spray Place 2 sprays into both nostrils daily as needed for allergies.    . furosemide (LASIX) 40 MG  tablet Take 40 mg by mouth 3 (three) times a week.  04/26/2015: Received from: External Pharmacy  . hydrOXYzine (ATARAX/VISTARIL) 25 MG tablet Take 1 tablet by mouth every 6 (six) hours as needed. Itching 04/26/2015: Received from: External Pharmacy Received Sig: take 1 tablet by mouth every 6 hours if needed for itching  . losartan (COZAAR) 100 MG tablet Take 100 mg by mouth daily.  02/19/2015: Received from: External Pharmacy  . NON FORMULARY    . Nystatin [facility]) 100000 UNIT/GM POWD  08/17/2015: Received from: External Pharmacy  . omeprazole (PRILOSEC) 40 MG capsule take 1 capsule by mouth once daily BEFORE BREAKFAST   . oxyCODONE (OXY IR/ROXICODONE) 5 MG immediate release tablet Take 1 tablet (5 mg total) by mouth every 4 (four) hours as needed for severe pain. (Patient not taking: Reported on 10/02/2016)   . oxyCODONE-acetaminophen (PERCOCET/ROXICET) 5-325 MG tablet Take 1 tablet by mouth every 4 (four) hours as needed for severe pain. As needed (Patient not taking: Reported on 10/02/2016)   . potassium chloride (K-DUR) 10 MEQ tablet Take 1 tablet by mouth daily. 04/26/2015: Received from: External Pharmacy Received Sig:   . predniSONE (DELTASONE) 10 MG tablet Take 4 tablets for 3 days, 3 tablets for 3 days, 2 tablets for 3 days, 1 tablets for 3 days then stop.   Marland Kitchen Respiratory Therapy Supplies (FLUTTER) DEVI USE AS DIRECTED   . rivaroxaban (XARELTO) 20 MG TABS tablet take 1 tablet by mouth once daily WITH SUPPER   . Vitamin D, Ergocalciferol, (DRISDOL) 50000 units CAPS capsule take 1 capsule by mouth every week FOR 16 WEEKS THEN TAKE TH    Facility-Administered Encounter Medications as of 12/18/2016  Medication  . 0.9 %  sodium chloride infusion    ALLERGIES:  Allergies  Allergen Reactions  . Shellfish Allergy Hives and Shortness Of Breath  . Ace Inhibitors Cough  .  Epinephrine Other (See Comments)    Pallpitations during dental procedures  . Theophyllines Nausea And Vomiting and Other (See  Comments)    Heart races and pounds  . Propoxyphene Hcl Nausea And Vomiting     PHYSICAL EXAM:  ECOG Performance status: ***  There were no vitals filed for this visit. There were no vitals filed for this visit.  Physical Exam   LABORATORY DATA:  I have reviewed the labs as listed.  CBC    Component Value Date/Time   WBC 7.1 07/20/2016 1322   RBC 3.64 (L) 07/20/2016 1322   HGB 10.7 (L) 07/20/2016 1322   HCT 34.0 (L) 07/20/2016 1322   PLT 363 07/20/2016 1322   MCV 93.4 07/20/2016 1322   MCH 29.4 07/20/2016 1322   MCHC 31.5 07/20/2016 1322   RDW 14.8 07/20/2016 1322   LYMPHSABS 0.8 07/20/2016 1322   MONOABS 0.4 07/20/2016 1322   EOSABS 0.1 07/20/2016 1322   BASOSABS 0.0 07/20/2016 1322   CMP Latest Ref Rng & Units 07/20/2016 01/20/2016 11/08/2015  Glucose 65 - 99 mg/dL 160(H) 132(H) 137(H)  BUN 6 - 20 mg/dL 18 26(H) 23(H)  Creatinine 0.44 - 1.00 mg/dL 1.28(H) 1.37(H) 1.15(H)  Sodium 135 - 145 mmol/L 135 138 140  Potassium 3.5 - 5.1 mmol/L 3.5 4.3 4.3  Chloride 101 - 111 mmol/L 98(L) 100(L) 102  CO2 22 - 32 mmol/L _0 Calcium 8.9 - 10.3 mg/dL 9.6 9.3 9.1  Total Protein 6.5 - 8.1 g/dL 8.1 7.8 7.6  Total Bilirubin 0.3 - 1.2 mg/dL 0.3 0.5 0.6  Alkaline Phos 38 - 126 U/L 64 64 55  AST 15 - 41 U/L _1 ALT 14 - 54 U/L _2 PENDING LABS:    DIAGNOSTIC IMAGING:  Most recent mammogram: 12/28/15    PATHOLOGY:     ASSESSMENT & PLAN:   Stage IA left breast invasive ductal carcinoma, ER+/PR+/HER2-:  -Diagnosed in 11/2014; treated with lumpectomy. Oncotype DX score 2 (low-risk, no benefit of chemotherapy). She went on to complete adjuvant radiation therapy. Genetic testing negative. Started anti-estrogen therapy with Arimidex in 05/2015.  -Last mamm[facility] done in 12/2015 at [facility] and was negative; orders placed today for 2018 annual diagnostic mammogram.  -Continue anastrozole; overall tolerating well.  -Clinical breast exam benign today.  -Return to  cancer center in 06/2017 to coordinate with next Prolia injection.    Bone health:  -Last DEXA scan 04/2015 revealed osteopenia. -Biennial DEXA scan due in 04/2017; orders placed today.   -Continue Prolia injections every 6 months; last injection 06/2016 and next injections will be due in 12/2016 & 06/2017.  -Continue calcium/vitamin D supplementation and increasing weight-bearing exercises, as tolerated.   Iron deficiency anemia:  -Last IV iron infusion (Feraheme) was in 01/2016.  -***      Dispo:  -Annual diagnostic mammo due in 12/2016 at [facility]; orders placed today.  -Biennial DEXA scan due in 04/2017; orders placed today.  -Return to cancer center in 06/2017 to coordinate with next Prolia injection.    All questions were answered to patient's stated satisfaction. Encouraged patient to call with any new concerns or questions before her next visit to the cancer center and we can certain see her sooner, if needed.    Plan of care discussed with Dr. ***, who agrees with the above aforementioned.    Orders placed this encounter:  No orders of the defined types were placed in this encounter.  Mike Craze, NP [place] [phone]

## 2016-12-18 ENCOUNTER — Ambulatory Visit (HOSPITAL_COMMUNITY): Payer: Medicare Other | Admitting: Adult Health

## 2016-12-25 ENCOUNTER — Ambulatory Visit (INDEPENDENT_AMBULATORY_CARE_PROVIDER_SITE_OTHER): Payer: Medicare Other | Admitting: Pulmonary Disease

## 2016-12-25 ENCOUNTER — Encounter: Payer: Self-pay | Admitting: Pulmonary Disease

## 2016-12-25 DIAGNOSIS — G4733 Obstructive sleep apnea (adult) (pediatric): Secondary | ICD-10-CM | POA: Diagnosis not present

## 2016-12-25 DIAGNOSIS — J45991 Cough variant asthma: Secondary | ICD-10-CM | POA: Diagnosis not present

## 2016-12-25 MED ORDER — BUDESONIDE-FORMOTEROL FUMARATE 80-4.5 MCG/ACT IN AERO: INHALATION_SPRAY | RESPIRATORY_TRACT | 1 refills | Status: DC

## 2016-12-25 MED ORDER — ALBUTEROL SULFATE 108 (90 BASE) MCG/ACT IN AEPB: 2.0000 | INHALATION_SPRAY | RESPIRATORY_TRACT | 4 refills | Status: DC | PRN

## 2016-12-25 NOTE — Patient Instructions (Addendum)
Refills on Symbicort & pro-air will be sent Supplies for CPAP will be renewed for a year

## 2016-12-25 NOTE — Assessment & Plan Note (Addendum)
Supplies for CPAP will be renewed for a year  Weight loss encouraged, compliance with goal of at least 4-6 hrs every night is the expectation. Advised against medications with sedative side effects Cautioned against driving when sleepy - understanding that sleepiness will vary on a day to day basis

## 2016-12-25 NOTE — Progress Notes (Signed)
   Subjective:    Patient ID: [name], female    DOB: [birth year], 66 y.o.   MRN: [id number]  HPI 48 yowf quit smoking in 1986 with morbid obesity and  history of Asthma, OSA &  GERD. She has cough variant asthma and is maintained on Symbicort She is maintained on flecainide and rivaroxaban for atrial fibrillation She was diagnosed in 2013 with mild OSA, with an AHI of 15 events per hour. He subsequently underwent a CPAP titration study with an optimal pressure of 8 cm of water.  She does have a chronic pain syndrome which results in disrupted sleep She has remstar plus c reflex 2013 model with nasal pillows.  She was treated for an acute bronchitis as January with doxycycline and prednisone and has recovered She denies cough, nocturnal wheezing. She does not have palpitations after taking Symbicort or any hoarseness.   She had good results with CPAP and denies daytime somnolence or fatigue. She's able to use a humidifier and does not have significant dryness. Her DME is [employer] and he didn't and she gets her supplies on time  She is bothered by chronic back pain and would like some narcotics for it, she has an appointment with Dr. Nelva Bush at [facility]opedics in 2 months   Review of Systems neg for any significant sore throat, dysphagia, itching, sneezing, nasal congestion or excess/ purulent secretions, fever, chills, sweats, unintended wt loss, pleuritic or exertional cp, hempoptysis, orthopnea pnd or change in chronic leg swelling. Also denies presyncope, palpitations, heartburn, abdominal pain, nausea, vomiting, diarrhea or change in bowel or urinary habits, dysuria,hematuria, rash, arthralgias, visual complaints, headache, numbness weakness or ataxia.     Objective:   Physical Exam  Gen. Pleasant, obese, in no distress ENT - no lesions, no post nasal drip Neck: No JVD, no thyromegaly, no carotid bruits Lungs: no use of accessory muscles, no dullness to  percussion, decreased without rales or rhonchi  Cardiovascular: Rhythm regular, heart sounds  normal, no murmurs or gallops, no peripheral edema Musculoskeletal: No deformities, no cyanosis or clubbing , no tremors, ambulates with a cane       Assessment & Plan:

## 2016-12-25 NOTE — Addendum Note (Signed)
Addended by: Valerie Salts on: 12/25/2016 04:23 PM   Modules accepted: Orders

## 2016-12-25 NOTE — Assessment & Plan Note (Signed)
Refills on Symbicort & pro-air will be sent year

## 2016-12-26 ENCOUNTER — Inpatient Hospital Stay (HOSPITAL_COMMUNITY): Admission: RE | Admit: 2016-12-26 | Payer: Medicare Other | Source: Ambulatory Visit | Admitting: Nurse Practitioner

## 2017-01-01 ENCOUNTER — Encounter (HOSPITAL_COMMUNITY): Payer: Self-pay | Admitting: Nurse Practitioner

## 2017-01-01 ENCOUNTER — Ambulatory Visit (HOSPITAL_COMMUNITY)
Admission: RE | Admit: 2017-01-01 | Discharge: 2017-01-01 | Disposition: A | Discharge status: Home / Self Care | Payer: Medicare Other | Source: Ambulatory Visit | Attending: Nurse Practitioner | Admitting: Nurse Practitioner

## 2017-01-01 DIAGNOSIS — G4733 Obstructive sleep apnea (adult) (pediatric): Secondary | ICD-10-CM | POA: Diagnosis not present

## 2017-01-01 DIAGNOSIS — K219 Gastro-esophageal reflux disease without esophagitis: Secondary | ICD-10-CM | POA: Diagnosis not present

## 2017-01-01 DIAGNOSIS — Z7951 Long term (current) use of inhaled steroids: Secondary | ICD-10-CM | POA: Insufficient documentation

## 2017-01-01 DIAGNOSIS — Z7984 Long term (current) use of oral hypoglycemic drugs: Secondary | ICD-10-CM | POA: Diagnosis not present

## 2017-01-01 DIAGNOSIS — J449 Chronic obstructive pulmonary disease, unspecified: Secondary | ICD-10-CM | POA: Insufficient documentation

## 2017-01-01 DIAGNOSIS — Z853 Personal history of malignant neoplasm of breast: Secondary | ICD-10-CM | POA: Insufficient documentation

## 2017-01-01 DIAGNOSIS — Z888 Allergy status to other drugs, medicaments and biological substances status: Secondary | ICD-10-CM | POA: Insufficient documentation

## 2017-01-01 DIAGNOSIS — Z6841 Body Mass Index (BMI) 40.0 and over, adult: Secondary | ICD-10-CM | POA: Insufficient documentation

## 2017-01-01 DIAGNOSIS — Z8249 Family history of ischemic heart disease and other diseases of the circulatory system: Secondary | ICD-10-CM | POA: Diagnosis not present

## 2017-01-01 DIAGNOSIS — Z87891 Personal history of nicotine dependence: Secondary | ICD-10-CM | POA: Diagnosis not present

## 2017-01-01 DIAGNOSIS — I10 Essential (primary) hypertension: Secondary | ICD-10-CM | POA: Insufficient documentation

## 2017-01-01 DIAGNOSIS — Z7901 Long term (current) use of anticoagulants: Secondary | ICD-10-CM | POA: Diagnosis not present

## 2017-01-01 DIAGNOSIS — M797 Fibromyalgia: Secondary | ICD-10-CM | POA: Diagnosis not present

## 2017-01-01 DIAGNOSIS — E785 Hyperlipidemia, unspecified: Secondary | ICD-10-CM | POA: Diagnosis not present

## 2017-01-01 DIAGNOSIS — I480 Paroxysmal atrial fibrillation: Secondary | ICD-10-CM

## 2017-01-01 DIAGNOSIS — I481 Persistent atrial fibrillation: Secondary | ICD-10-CM | POA: Diagnosis not present

## 2017-01-01 DIAGNOSIS — Z825 Family history of asthma and other chronic lower respiratory diseases: Secondary | ICD-10-CM | POA: Diagnosis not present

## 2017-01-01 DIAGNOSIS — M1990 Unspecified osteoarthritis, unspecified site: Secondary | ICD-10-CM | POA: Insufficient documentation

## 2017-01-01 DIAGNOSIS — G4730 Sleep apnea, unspecified: Secondary | ICD-10-CM | POA: Insufficient documentation

## 2017-01-01 DIAGNOSIS — F419 Anxiety disorder, unspecified: Secondary | ICD-10-CM | POA: Diagnosis not present

## 2017-01-01 DIAGNOSIS — Z79899 Other long term (current) drug therapy: Secondary | ICD-10-CM | POA: Insufficient documentation

## 2017-01-01 DIAGNOSIS — Z803 Family history of malignant neoplasm of breast: Secondary | ICD-10-CM | POA: Insufficient documentation

## 2017-01-01 DIAGNOSIS — Z79811 Long term (current) use of aromatase inhibitors: Secondary | ICD-10-CM | POA: Insufficient documentation

## 2017-01-01 DIAGNOSIS — Z91013 Allergy to seafood: Secondary | ICD-10-CM | POA: Diagnosis not present

## 2017-01-01 DIAGNOSIS — Z923 Personal history of irradiation: Secondary | ICD-10-CM | POA: Diagnosis not present

## 2017-01-01 DIAGNOSIS — I4891 Unspecified atrial fibrillation: Secondary | ICD-10-CM | POA: Diagnosis present

## 2017-01-01 MED ORDER — RIVAROXABAN 20 MG PO TABS: ORAL_TABLET | ORAL | 2 refills | Status: DC

## 2017-01-01 MED ORDER — FLECAINIDE ACETATE 50 MG PO TABS: 50.0000 mg | ORAL_TABLET | Freq: Two times a day (BID) | ORAL | 2 refills | Status: DC

## 2017-01-01 NOTE — Progress Notes (Signed)
Primary Care Physician: [name], MD Referring Physician: Dr. Rayann Heman Cardiologist: Dr. Martinique   [name] is a 66 y.o. female with a h/o persistent afib that is in the afib clinic for f/I. She continues on flecainide 50 mg bid and xarelto 20 mg qd. She does not report any breakthrough afib in the last 6 months. Health status is unchanged.  Today, she denies symptoms of palpitations, chest pain, shortness of breath, orthopnea, PND, lower extremity edema, dizziness, presyncope, syncope, or neurologic sequela. The patient is tolerating medications without difficulties and is otherwise without complaint today.   Past Medical History:  Diagnosis Date  . Acute renal failure ([place]) 01/27/2015  . Acute respiratory failure ([place]) 01/27/2015  . Adenomatous colon polyp 1993  . Allergy   . Anemia   . Anxiety   . Anxiety disorder   . Asthma    Spirometry 1989: FEV1 2.12 (71%) with ratio 82.  HFA 90% after coaching 11-09-10  . Atrial flutter ([place]) chadsvasc of 3 (11/19/14)   Status post cardioversion 2-12 2013. s/p RFCA 5/13 with JAllred (coumadin and amio stopped)   . Breast cancer ([place])    left  . COPD (chronic obstructive pulmonary disease) ([place])   . DDD (degenerative disc disease)   . Diverticulosis   . Essential hypertension, benign   . Fibromyalgia   . GERD (gastroesophageal reflux disease)    EGD with HH / esophagitis 06-30-93  . Hiatal hernia   . History of atrial fibrillation   . Hyperlipidemia   . Internal and external hemorrhoids without complication   . Lipoma of colon   . Migraine   . Osteoarthritis   . Pelvic fracture ([place])   . Personal history of colonic polyps 06/14/2010  . Pneumonia   . PONV (postoperative nausea and vomiting)   . Radiation    left breast cancer  . Sleep apnea    cpap   Past Surgical History:  Procedure Laterality Date  . A FLUTTER ABLATION  02/22/2012  . ATRIAL FLUTTER ABLATION N/A 02/22/2012   Procedure: ATRIAL FLUTTER ABLATION;   Surgeon: Thompson Grayer, MD;  Location: [facility] CATH LAB;  Service: Cardiovascular;  Laterality: N/A;  . BREAST BIOPSY Left 11/2014  . CARDIOVERSION N/A 02/19/2014   Procedure: CARDIOVERSION;  Surgeon: Dorothy Spark, MD;  Location: [facility];  Service: Cardiovascular;  Laterality: N/A;  . COLONOSCOPY  05/2010, 07/2000  . DILATION AND CURETTAGE OF UTERUS    . ESOPHAGOGASTRODUODENOSCOPY  05/2010, 06/1993  . JOINT REPLACEMENT    . POLYPECTOMY    . RADIOACTIVE SEED GUIDED MASTECTOMY WITH AXILLARY SENTINEL LYMPH NODE BIOPSY Left 01/26/2015   Procedure: LEFT  PARTIAL MASTECTOMY WITH  RADIOACTIVE SEED LOCALIZATION, LEFT AXILLARY SENTINEL LYMPH NODE BIOPSY;  Surgeon: Fanny Skates, MD;  Location: [facility];  Service: General;  Laterality: Left;  . ROTATOR CUFF REPAIR     bilateral  . ROTATOR CUFF REPAIR     bil  . TOTAL KNEE ARTHROPLASTY     x2 09/2006  . UPPER GASTROINTESTINAL ENDOSCOPY      Current Outpatient Prescriptions  Medication Sig Dispense Refill  . acetaminophen (TYLENOL) 500 MG tablet Take 1,000-1,500 mg by mouth 2 (two) times daily as needed for headache (migraines). Reported on 09/08/2015    . Albuterol Sulfate (PROAIR RESPICLICK) 962 (90 Base) MCG/ACT AEPB Inhale 2 puffs into the lungs every 4 (four) hours as needed. 1 each 4  . ALPRAZolam (XANAX) 0.5 MG tablet Take 0.5 mg by mouth 2 (two)  times daily as needed for anxiety.   0  . anastrozole (ARIMIDEX) 1 MG tablet Take 1 tablet (1 mg total) by mouth daily. 30 tablet 3  . atorvastatin (LIPITOR) 10 MG tablet Take 10 mg by mouth daily.  0  . bisoprolol (ZEBETA) 10 MG tablet Take 1 tablet (10 mg total) by mouth daily. 30 tablet 2  . budesonide-formoterol (SYMBICORT) 80-4.5 MCG/ACT inhaler inhale 2 puffs by mouth twice a day Rinse mouth after use 3 Inhaler 1  . calcium-vitamin D (OSCAL WITH D) 500-200 MG-UNIT per tablet Take 1 tablet by mouth daily with breakfast. 30 tablet 3  . CARTIA XT 180 MG 24 hr capsule take 1 capsule by mouth once daily 90  capsule 3  . celecoxib (CELEBREX) 200 MG capsule Take 1 capsule by mouth every 3-4 days  0  . cyclobenzaprine (FLEXERIL) 10 MG tablet Take 1 tablet by mouth 3 (three) times daily. As needed  0  . flecainide (TAMBOCOR) 50 MG tablet Take 1 tablet (50 mg total) by mouth 2 (two) times daily. 180 tablet 2  . fluticasone (FLONASE) 50 MCG/ACT nasal spray Place 2 sprays into both nostrils daily as needed for allergies.   0  . furosemide (LASIX) 40 MG tablet Take 40 mg by mouth 3 (three) times a week.   0  . hydrOXYzine (ATARAX/VISTARIL) 25 MG tablet Take 1 tablet by mouth every 6 (six) hours as needed. Itching  0  . losartan (COZAAR) 100 MG tablet Take 100 mg by mouth daily.   1  . Nystatin (NYAMYC) 100000 UNIT/GM POWD   1  . omeprazole (PRILOSEC) 40 MG capsule take 1 capsule by mouth once daily BEFORE BREAKFAST 90 capsule 1  . potassium chloride (K-DUR) 10 MEQ tablet Take 1 tablet by mouth daily.  0  . Respiratory Therapy Supplies (FLUTTER) DEVI USE AS DIRECTED 1 each 0  . rivaroxaban (XARELTO) 20 MG TABS tablet take 1 tablet by mouth once daily WITH SUPPER 90 tablet 2  . Vitamin D, Ergocalciferol, (DRISDOL) 50000 units CAPS capsule take 1 capsule by mouth every week FOR 16 WEEKS THEN TAKE TH 16 capsule 0  . metFORMIN (GLUCOPHAGE) 500 MG tablet 1 tablet daily.     No current facility-administered medications for this encounter.    Facility-Administered Medications Ordered in Other Encounters  Medication Dose Route Frequency Provider Last Rate Last Dose  . 0.9 %  sodium chloride infusion   Intravenous Continuous Patrici Ranks, MD        Allergies  Allergen Reactions  . Shellfish Allergy Hives and Shortness Of Breath  . Ace Inhibitors Cough  . Epinephrine Other (See Comments)    Pallpitations during dental procedures  . Theophyllines Nausea And Vomiting and Other (See Comments)    Heart races and pounds  . Propoxyphene Hcl Nausea And Vomiting    Social History   Social History  .  Marital status: Widowed    Spouse name: N/A  . Number of children: 0  . Years of education: N/A   Occupational History  . Accounting tech at [employer] Main Topics  . Smoking status: Former Smoker    Packs/day: 1.00    Years: 20.00    Types: Cigarettes    Quit date: 09/25/1984  . Smokeless tobacco: Never Used  . Alcohol use No  . Drug use: No  . Sexual activity: Not Currently   Other Topics Concern  . Not on file  Social History Narrative  . No narrative on file    Family History  Problem Relation Age of Onset  . Emphysema Father     smoker  . Heart disease Father   . Breast cancer Mother 29  . Breast cancer Sister 42  . Heart Problems Sister   . Heart disease Sister   . Breast cancer Maternal Aunt 48  . Lung cancer Maternal Uncle 58    smoker  . Colon cancer Paternal Uncle 53  . Diabetes Maternal Grandmother   . Heart Problems Maternal Grandfather   . Stroke Paternal Grandfather   . Lung cancer Maternal Uncle 72    smoker  . Heart Problems Paternal Uncle   . Diabetes Paternal Grandmother   . Rectal cancer Neg Hx   . Stomach cancer Neg Hx   . Esophageal cancer Neg Hx     ROS- All systems are reviewed and negative except as per the HPI above  Physical Exam: Vitals:   01/01/17 0847  BP: 138/66  Pulse: 69  Weight: 278 lb 6.4 oz (126.3 kg)  Height: 5\' 5"  (1.651 m)   Wt Readings from Last 3 Encounters:  01/01/17 278 lb 6.4 oz (126.3 kg)  12/25/16 250 lb (113.4 kg)  10/02/16 269 lb 9.6 oz (122.3 kg)    Labs: Lab Results  Component Value Date   NA 135 07/20/2016   K 3.5 07/20/2016   CL 98 (L) 07/20/2016   CO2 27 07/20/2016   GLUCOSE 160 (H) 07/20/2016   BUN 18 07/20/2016   CREATININE 1.28 (H) 07/20/2016   CALCIUM 9.6 07/20/2016   PHOS 5.4 (H) 01/28/2015   MG 2.0 01/28/2015   Lab Results  Component Value Date   INR 1.18 01/25/2015   No results found for: CHOL, HDL, LDLCALC, TRIG   GEN- The patient is  well appearing, alert and oriented x 3 today.   Head- normocephalic, atraumatic Eyes-  Sclera clear, conjunctiva pink Ears- hearing intact Oropharynx- clear Neck- supple, no JVP Lymph- no cervical lymphadenopathy Lungs- Clear to ausculation bilaterally, normal work of breathing Heart- Regular rate and rhythm, no murmurs, rubs or gallops, PMI not laterally displaced GI- soft, NT, ND, + BS Extremities- no clubbing, cyanosis, or edema MS- no significant deformity or atrophy Skin- no rash or lesion Psych- euthymic mood, full affect Neuro- strength and sensation are intact  EKG-NSR at 69 bpm, pr int 176 ms, qrs int 92 ms, qtc 458 ms Epic records reviewed   Assessment and Plan: 1. Persistent afib  Maintaining SR  Continue flecainide 50 mg bid Continue bisoprolol 10 mg qd Continue  xarelto 20 mg bid  2. OSA Continue compliance with CPAP  3. Morbid obesity Weight loss encouraged  4. HTN Stable  F/u in afib clinic in 6 months  Butch Penny C. Kabir Brannock, [facility] [address] [place], [place] [address] [phone]

## 2017-01-09 ENCOUNTER — Other Ambulatory Visit (HOSPITAL_COMMUNITY): Payer: Self-pay | Admitting: *Deleted

## 2017-01-17 NOTE — Progress Notes (Addendum)
[place], [place] [address]   CLINIC:  Medical Oncology/Hematology  PCP:  [name], MD [place] [address] [phone]   REASON FOR VISIT:  Follow-up for Stage IA left breast invasive ductal carcinoma; ER+/PR+/HER2- AND Osteopenia   CURRENT THERAPY: Anastrozole daily AND Prolia inj every 6 months  BRIEF ONCOLOGIC HISTORY:    Breast cancer of upper-inner quadrant of left female breast ([place])   12/15/2014 Mammogram    new 63m round nodule in left breast is indeterminate      12/17/2014 Imaging    6 mm lobulated nodule in the left breast is at low suspicion for malignancy. ultrasound guided biopsy recommended      12/23/2014 Procedure    [facility] Invasive ductal carcinoma.  ER+ (100%), PR+ (99%), HER2 neg (ratio 1.61). Ki67 45%.       01/26/2015 Surgery    (L) lumpectomy with SLNB (Dalbert Batman: IDC, grade 2, spanning 1.1 cm; also low-grade DCIS; margins negative. 0/3 left axillary SLN. HER2 repeated and remains neg. (ratio 1.33).  pT1c, pN0: Stage IA      03/19/2015 Genetic Testing    Genetic testing: Negative. Genes analyzed: ATM, BARD1, BRCA1, BRCA2, BRIP1, CDH1, CHEK2, FANCC, MLH1, MSH2, MSH6, NBN, PALB2, PMS2, PTEN, RAD51C, RAD51D, STK11, TP53, and XRCC2.  Additionally, this panel includes deletion/duplication analysis (without next-generation sequencing) of one gene, EPCAM.      03/2015 - 04/2015 Radiation Therapy    Adjuvant breast radiation with Dr. WPablo Ledger       04/30/2015 Imaging    DEXA scan: Osteopenia. (T-Score of -1.1)      05/2015 -  Anti-estrogen oral therapy    Anastrozole. Planned duration of therapy 5-10 years.       07/16/2015 Miscellaneous    Prolia injections started.         HISTORY OF PRESENT ILLNESS:  (From Dr. PDonald Porelast note on 07/20/16)     INTERVAL HISTORY:  Ms. BHelmer89y.o. female returns for routine follow-up for history of left breast  cancer.   Her biggest complaint today is fatigue. Reports "having no energy whatsoever."  Her appetite is good, about 75% of her baseline. Denies any fever/chills/night sweats.  She doesn't sleep well; "I can't remember the last time I slept well."  She has seasonal allergies that affect her asthma; her shortness of breath with exertion is a little worse right now as a result.  States that she "itches all the time" d/t dry skin and allergies.    Continues to tolerate the Anastrozole relatively well; endorses continued arthralgias, but she does not feel they are any worse. She does ambulate with a cane.  Denies vaginal dryness or hot flashes.  Denies any new breast complaints or concerns. She is requesting a refill of the Nystatin powder that she applies under her breasts when she breaks out in a rash.   She is due for Prolia injection today. Denies any recent invasive dental procedures/extractions.  Her mammogram is up-to-date; [place] [facility] [place]    Otherwise, she feels well.  She is looking forward to getting back to playing pool competitively [place]      REVIEW OF SYSTEMS:  Review of Systems  Constitutional: Positive for fatigue. Negative for chills and fever.  HENT:  Negative.  Negative for lump/mass and nosebleeds.   Eyes: Negative.   Respiratory: Positive  for shortness of breath. Negative for cough.   Cardiovascular: Negative.  Negative for chest pain and leg swelling.  Gastrointestinal: Negative.  Negative for abdominal pain, blood in stool, constipation, diarrhea, nausea and vomiting.  Endocrine: Negative.   Genitourinary: Negative.  Negative for dysuria and hematuria.   Musculoskeletal: Positive for arthralgias.  Skin: Positive for rash (under bilat breasts (chronic)).  Neurological: Negative.  Negative for dizziness and headaches.  Hematological: Negative.  Negative for adenopathy. Does not bruise/bleed easily.    Psychiatric/Behavioral: Positive for sleep disturbance. Negative for depression. The patient is not nervous/anxious.      PAST MEDICAL/SURGICAL HISTORY:  Past Medical History:  Diagnosis Date  . Acute renal failure ([place]) 01/27/2015  . Acute respiratory failure ([place]) 01/27/2015  . Adenomatous colon polyp 1993  . Allergy   . Anemia   . Anxiety   . Anxiety disorder   . Asthma    Spirometry 1989: FEV1 2.12 (71%) with ratio 82.  HFA 90% after coaching 11-09-10  . Atrial flutter ([place]) chadsvasc of 3 (11/19/14)   Status post cardioversion 2-12 2013. s/p RFCA 5/13 with JAllred (coumadin and amio stopped)   . Breast cancer ([place])    left  . COPD (chronic obstructive pulmonary disease) ([place])   . DDD (degenerative disc disease)   . Diverticulosis   . Essential hypertension, benign   . Fibromyalgia   . GERD (gastroesophageal reflux disease)    EGD with HH / esophagitis 06-30-93  . Hiatal hernia   . History of atrial fibrillation   . Hyperlipidemia   . Internal and external hemorrhoids without complication   . Lipoma of colon   . Migraine   . Osteoarthritis   . Pelvic fracture ([place])   . Personal history of colonic polyps 06/14/2010  . Pneumonia   . PONV (postoperative nausea and vomiting)   . Radiation    left breast cancer  . Sleep apnea    cpap   Past Surgical History:  Procedure Laterality Date  . A FLUTTER ABLATION  02/22/2012  . ATRIAL FLUTTER ABLATION N/A 02/22/2012   Procedure: ATRIAL FLUTTER ABLATION;  Surgeon: Thompson Grayer, MD;  Location[facility] CATH LAB;  Service: Cardiovascular;  Laterality: N/A;  . BREAST BIOPSY Left 11/2014  . CARDIOVERSION N/A 02/19/2014   Procedure: CARDIOVERSION;  Surgeon: Dorothy Spark, MD;  Location: [facility];  Service: Cardiovascular;  Laterality: N/A;  . COLONOSCOPY  05/2010, 07/2000  . DILATION AND CURETTAGE OF UTERUS    . ESOPHAGOGASTRODUODENOSCOPY  05/2010, 06/1993  . JOINT REPLACEMENT    . POLYPECTOMY    . RADIOACTIVE SEED GUIDED MASTECTOMY WITH  AXILLARY SENTINEL LYMPH NODE BIOPSY Left 01/26/2015   Procedure: LEFT  PARTIAL MASTECTOMY WITH  RADIOACTIVE SEED LOCALIZATION, LEFT AXILLARY SENTINEL LYMPH NODE BIOPSY;  Surgeon: Fanny Skates, MD;  Location: [facility];  Service: General;  Laterality: Left;  . ROTATOR CUFF REPAIR     bilateral  . ROTATOR CUFF REPAIR     bil  . TOTAL KNEE ARTHROPLASTY     x2 09/2006  . UPPER GASTROINTESTINAL ENDOSCOPY       SOCIAL HISTORY:  Social History   Social History  . Marital status: Widowed    Spouse name: N/A  . Number of children: 0  . Years of education: N/A   Occupational History  . Accounting tech at [employer] History Main Topics  . Smoking status: Former Smoker    Packs/day: 1.00    Years: 20.00  Types: Cigarettes    Quit date: 09/25/1984  . Smokeless tobacco: Never Used  . Alcohol use No  . Drug use: No  . Sexual activity: Not Currently   Other Topics Concern  . Not on file   Social History Narrative  . No narrative on file    FAMILY HISTORY:  Family History  Problem Relation Age of Onset  . Emphysema Father     smoker  . Heart disease Father   . Breast cancer Mother 75  . Breast cancer Sister 53  . Heart Problems Sister   . Heart disease Sister   . Breast cancer Maternal Aunt 48  . Lung cancer Maternal Uncle 9    smoker  . Colon cancer Paternal Uncle 32  . Diabetes Maternal Grandmother   . Heart Problems Maternal Grandfather   . Stroke Paternal Grandfather   . Lung cancer Maternal Uncle 72    smoker  . Heart Problems Paternal Uncle   . Diabetes Paternal Grandmother   . Rectal cancer Neg Hx   . Stomach cancer Neg Hx   . Esophageal cancer Neg Hx     CURRENT MEDICATIONS:  Outpatient Encounter Prescriptions as of 01/18/2017  Medication Sig Note  . acetaminophen (TYLENOL) 500 MG tablet Take 1,000-1,500 mg by mouth 2 (two) times daily as needed for headache (migraines). Reported on 09/08/2015   . Albuterol Sulfate (PROAIR  RESPICLICK) 161 (90 Base) MCG/ACT AEPB Inhale 2 puffs into the lungs every 4 (four) hours as needed.   . ALPRAZolam (XANAX) 0.5 MG tablet Take 0.5 mg by mouth 2 (two) times daily as needed for anxiety.  02/19/2015: Received from: External Pharmacy  . anastrozole (ARIMIDEX) 1 MG tablet Take 1 tablet (1 mg total) by mouth daily.   . APPLE CIDER VINEGAR PO Take by mouth.   Marland Kitchen atorvastatin (LIPITOR) 10 MG tablet Take 10 mg by mouth daily. 06/03/2015: Received from: External Pharmacy  . bisoprolol (ZEBETA) 10 MG tablet Take 1 tablet (10 mg total) by mouth daily.   . budesonide-formoterol (SYMBICORT) 80-4.5 MCG/ACT inhaler inhale 2 puffs by mouth twice a day Rinse mouth after use   . calcium-vitamin D (OSCAL WITH D) 500-200 MG-UNIT per tablet Take 1 tablet by mouth daily with breakfast.   . CARTIA XT 180 MG 24 hr capsule take 1 capsule by mouth once daily   . celecoxib (CELEBREX) 200 MG capsule Take 1 capsule by mouth every 3-4 days 04/26/2015: Received from: External Pharmacy Received Sig:   . cyclobenzaprine (FLEXERIL) 10 MG tablet Take 1 tablet by mouth 3 (three) times daily. As needed 02/01/2016: Received from: External Pharmacy Received Sig: take 1 tablet by mouth three times a day if needed for SPASMS  . flecainide (TAMBOCOR) 50 MG tablet Take 1 tablet (50 mg total) by mouth 2 (two) times daily.   . fluticasone (FLONASE) 50 MCG/ACT nasal spray Place 2 sprays into both nostrils daily as needed for allergies.    . furosemide (LASIX) 40 MG tablet Take 40 mg by mouth 3 (three) times a week.  04/26/2015: Received from: External Pharmacy  . GARCINIA CAMBOGIA-CHROMIUM PO Take by mouth.   . hydrOXYzine (ATARAX/VISTARIL) 25 MG tablet Take 1 tablet by mouth every 6 (six) hours as needed. Itching 04/26/2015: Received from: External Pharmacy Received Sig: take 1 tablet by mouth every 6 hours if needed for itching  . losartan (COZAAR) 100 MG tablet Take 100 mg by mouth daily.  02/19/2015: Received from: External Pharmacy  .  metFORMIN (GLUCOPHAGE)  500 MG tablet 1 tablet daily.   Marland Kitchen nystatin [facility]) powder Apply topically 2 (two) times daily as needed.   Marland Kitchen omeprazole (PRILOSEC) 40 MG capsule take 1 capsule by mouth once daily BEFORE BREAKFAST   . potassium chloride (K-DUR) 10 MEQ tablet Take 1 tablet by mouth daily. 04/26/2015: Received from: External Pharmacy Received Sig:   . Respiratory Therapy Supplies (FLUTTER) DEVI USE AS DIRECTED   . rivaroxaban (XARELTO) 20 MG TABS tablet take 1 tablet by mouth once daily WITH SUPPER   . temazepam (RESTORIL) 30 MG capsule Take 1 capsule (30 mg total) by mouth at bedtime as needed for sleep.   . Vitamin D, Ergocalciferol, (DRISDOL) 50000 units CAPS capsule take 1 capsule by mouth every week FOR 16 WEEKS THEN TAKE TH   . [DISCONTINUED] anastrozole (ARIMIDEX) 1 MG tablet Take 1 tablet (1 mg total) by mouth daily.   . [DISCONTINUED] Nystatin [facility]) 100000 UNIT/GM POWD  08/17/2015: Received from: External Pharmacy   Facility-Administered Encounter Medications as of 01/18/2017  Medication  . 0.9 %  sodium chloride infusion  . [COMPLETED] denosumab (PROLIA) injection 60 mg    ALLERGIES:  Allergies  Allergen Reactions  . Shellfish Allergy Hives and Shortness Of Breath  . Ace Inhibitors Cough  . Epinephrine Other (See Comments)    Pallpitations during dental procedures  . Theophyllines Nausea And Vomiting and Other (See Comments)    Heart races and pounds  . Propoxyphene Hcl Nausea And Vomiting     PHYSICAL EXAM:  ECOG Performance status: 1 - Symptomatic, but independent   Vitals:   01/18/17 1353  BP: (!) 145/55  Pulse: 80  Resp: 18  Temp: 98.6 F (37 C)   Filed Weights   01/18/17 1353  Weight: 251 lb 11.2 oz (114.2 kg)    Physical Exam  Constitutional: She is oriented to person, place, and time and well-developed, well-nourished, and in no distress.  HENT:  Head: Normocephalic.  Mouth/Throat: Oropharynx is clear and moist. No oropharyngeal exudate.  Eyes:  Conjunctivae are normal. Pupils are equal, round, and reactive to light. No scleral icterus.  Neck: Normal range of motion. Neck supple.  Cardiovascular: Normal rate, regular rhythm and normal heart sounds.   Pulmonary/Chest: Effort normal. No respiratory distress. She has wheezes (Expiratory wheezes).    Abdominal: Soft. Bowel sounds are normal. There is no tenderness. There is no rebound and no guarding.  Musculoskeletal: She exhibits edema (Trace ankle edema ).  Requires assistance to get on exam table.   Lymphadenopathy:    She has no cervical adenopathy.       Right: No supraclavicular adenopathy present.       Left: No supraclavicular adenopathy present.  Neurological: She is alert and oriented to person, place, and time. No cranial nerve deficit. Gait normal.  Skin: Skin is warm and dry. Rash (intramammary fold ) noted.  Psychiatric: Mood, memory, affect and judgment normal.  Nursing note and vitals reviewed.    LABORATORY DATA:  I have reviewed the labs as listed.  CBC    Component Value Date/Time   WBC 7.1 01/18/2017 1244   RBC 3.50 (L) 01/18/2017 1244   HGB 10.1 (L) 01/18/2017 1244   HCT 31.5 (L) 01/18/2017 1244   PLT 342 01/18/2017 1244   MCV 90.0 01/18/2017 1244   MCH 28.9 01/18/2017 1244   MCHC 32.1 01/18/2017 1244   RDW 15.7 (H) 01/18/2017 1244   LYMPHSABS 1.0 01/18/2017 1244   MONOABS 0.4 01/18/2017 1244   EOSABS  0.2 01/18/2017 1244   BASOSABS 0.1 01/18/2017 1244   CMP Latest Ref Rng & Units 01/18/2017 07/20/2016 01/20/2016  Glucose 65 - 99 mg/dL 169(H) 160(H) 132(H)  BUN 6 - 20 mg/dL 20 18 26(H)  Creatinine 0.44 - 1.00 mg/dL 1.30(H) 1.28(H) 1.37(H)  Sodium 135 - 145 mmol/L 137 135 138  Potassium 3.5 - 5.1 mmol/L 3.6 3.5 4.3  Chloride 101 - 111 mmol/L 99(L) 98(L) 100(L)  CO2 22 - 32 mmol/L _0 Calcium 8.9 - 10.3 mg/dL 9.2 9.6 9.3  Total Protein 6.5 - 8.1 g/dL 7.7 8.1 7.8  Total Bilirubin 0.3 - 1.2 mg/dL 0.5 0.3 0.5  Alkaline Phos 38 - 126 U/L 64 64  64  AST 15 - 41 U/L _1 ALT 14 - 54 U/L _2 PENDING LABS:    DIAGNOSTIC IMAGING:  Most recent mammogram: 12/28/16 [facility])    PATHOLOGY:  (L) lumpectomy surgical path: 01/26/15       ASSESSMENT & PLAN:   Stage IA left breast invasive ductal carcinoma; ER+/PR+/HER2-: -Diagnosed in 11/2014; treated with (L) breast lumpectomy with Dr. Dalbert Batman, followed by adjuvant radiation therapy with Dr. Pablo Ledger. Completed radiation in 04/2015. Started anti-estrogen therapy with Anastrozole in 05/2015. Plan to continue anti-estrogen therapy for 5-10 years.  -Clinical breast exam benign today.  -Most recent mammogram completed at [facility] on 12/28/16; report reviewed and no evidence of malignancy.  -Continue anastrozole.  -Return to cancer center in 6 months for continued surveillance.   Bone health:  -Last DEXA scan on 04/30/15 showed osteopenia (T-score -1.1).  -Continue calcium/vitamin D supplementation and weight-bearing exercises, as tolerated.  -Labs reviewed and calcium adequate for Prolia injection today.  -Continue Prolia injections every 6 months as scheduled.  -Will be due for biennial DEXA imaging in 04/2017; orders placed today.   Fungal skin rash:  -Nystatin refilled to be used under breasts, as needed. Encouraged her to try to keep skin clean and dry to prevent fungal skin infections.   Insomnia:  -Discussed a trial of Restoril to see if this helps with her sleep disturbance. She took Ambien in the past, but it was reportedly not effective and she didn't like the way it made her feel.  Discussed possible side effects of Restoril. She agreed to give Restoril a try. She has Xanax prn for anxiety. Instructed her not to take the Restoril and Xanax at the same time.   -Paper prescription for Restoril 30 mg QHSprn, #30, no refills given to patient. If it is effective, I am happy to refill in the future.   Fatigue:  -Iron studies pending for today. Certainly iron  deficiency anemia can contribute to fatigue. Attempted to add-on vitamin D, folate, and EPO level to labs already collected today, but there was not enough sample. Offered to send patient back to lab today for recollection vs adding on labs for next visit and she prefers to wait until next visit, which is reasonable.   -I am hopeful that if she is able to sleep better with the addition of Restoril, then she may have some improvement in her fatigue symptoms.  -Will add-on full anemia panel, vitamin D, EPO (given CKD and could be contributing to anemia/fatigue) for next visit.      Dispo:  -Biennial DEXA scan due 04/2017; orders placed today.  -Return to cancer center in 6 months for continued surveillance with labs (CBC with diff, CMET, anemia panel, vitamin D, and EPO); orders  placed today.  -Next mammogram due in 12/2017 at [facility]; orders placed today.     All questions were answered to patient's stated satisfaction. Encouraged patient to call with any new concerns or questions before her next visit to the cancer center and we can certain see her sooner, if needed.    Plan of care discussed with Dr. Talbert Cage, who agrees with the above aforementioned.    Orders placed this encounter:  Orders Placed This Encounter  Procedures  . DG Bone Density  . MM DIAG BREAST TOMO BILATERAL  . CBC with Differential/Platelet  . Comprehensive metabolic panel  . Vitamin B12  . Folate  . Iron and TIBC  . Ferritin  . VITAMIN D 25 Hydroxy (Vit-D Deficiency, Fractures)  . Erythropoietin      Mike Craze, NP [place] [phone]

## 2017-01-18 ENCOUNTER — Encounter (HOSPITAL_COMMUNITY): Payer: Medicare Other

## 2017-01-18 ENCOUNTER — Other Ambulatory Visit (HOSPITAL_COMMUNITY): Payer: Self-pay

## 2017-01-18 ENCOUNTER — Encounter (HOSPITAL_BASED_OUTPATIENT_CLINIC_OR_DEPARTMENT_OTHER): Payer: Medicare Other | Admitting: Adult Health

## 2017-01-18 ENCOUNTER — Encounter (HOSPITAL_COMMUNITY): Payer: Medicare Other | Attending: Adult Health

## 2017-01-18 ENCOUNTER — Encounter (HOSPITAL_COMMUNITY): Payer: Self-pay | Admitting: Adult Health

## 2017-01-18 VITALS — BP 145/55 | HR 80 | Temp 98.6°F | Resp 18 | Ht 65.0 in | Wt 251.7 lb

## 2017-01-18 DIAGNOSIS — I10 Essential (primary) hypertension: Secondary | ICD-10-CM | POA: Insufficient documentation

## 2017-01-18 DIAGNOSIS — G43909 Migraine, unspecified, not intractable, without status migrainosus: Secondary | ICD-10-CM | POA: Diagnosis not present

## 2017-01-18 DIAGNOSIS — J449 Chronic obstructive pulmonary disease, unspecified: Secondary | ICD-10-CM | POA: Diagnosis not present

## 2017-01-18 DIAGNOSIS — Z801 Family history of malignant neoplasm of trachea, bronchus and lung: Secondary | ICD-10-CM | POA: Insufficient documentation

## 2017-01-18 DIAGNOSIS — Z7902 Long term (current) use of antithrombotics/antiplatelets: Secondary | ICD-10-CM | POA: Insufficient documentation

## 2017-01-18 DIAGNOSIS — Z79811 Long term (current) use of aromatase inhibitors: Secondary | ICD-10-CM

## 2017-01-18 DIAGNOSIS — Z86010 Personal history of colonic polyps: Secondary | ICD-10-CM | POA: Diagnosis not present

## 2017-01-18 DIAGNOSIS — C50212 Malignant neoplasm of upper-inner quadrant of left female breast: Secondary | ICD-10-CM

## 2017-01-18 DIAGNOSIS — D631 Anemia in chronic kidney disease: Secondary | ICD-10-CM

## 2017-01-18 DIAGNOSIS — G4700 Insomnia, unspecified: Secondary | ICD-10-CM | POA: Insufficient documentation

## 2017-01-18 DIAGNOSIS — Z9012 Acquired absence of left breast and nipple: Secondary | ICD-10-CM | POA: Insufficient documentation

## 2017-01-18 DIAGNOSIS — Z9889 Other specified postprocedural states: Secondary | ICD-10-CM | POA: Diagnosis not present

## 2017-01-18 DIAGNOSIS — M797 Fibromyalgia: Secondary | ICD-10-CM | POA: Insufficient documentation

## 2017-01-18 DIAGNOSIS — Z96659 Presence of unspecified artificial knee joint: Secondary | ICD-10-CM | POA: Insufficient documentation

## 2017-01-18 DIAGNOSIS — R5383 Other fatigue: Secondary | ICD-10-CM

## 2017-01-18 DIAGNOSIS — Z7984 Long term (current) use of oral hypoglycemic drugs: Secondary | ICD-10-CM | POA: Diagnosis not present

## 2017-01-18 DIAGNOSIS — K219 Gastro-esophageal reflux disease without esophagitis: Secondary | ICD-10-CM | POA: Diagnosis not present

## 2017-01-18 DIAGNOSIS — Z836 Family history of other diseases of the respiratory system: Secondary | ICD-10-CM | POA: Insufficient documentation

## 2017-01-18 DIAGNOSIS — G4730 Sleep apnea, unspecified: Secondary | ICD-10-CM | POA: Diagnosis not present

## 2017-01-18 DIAGNOSIS — Z833 Family history of diabetes mellitus: Secondary | ICD-10-CM | POA: Insufficient documentation

## 2017-01-18 DIAGNOSIS — D649 Anemia, unspecified: Secondary | ICD-10-CM

## 2017-01-18 DIAGNOSIS — Z8249 Family history of ischemic heart disease and other diseases of the circulatory system: Secondary | ICD-10-CM | POA: Insufficient documentation

## 2017-01-18 DIAGNOSIS — Z170 Estrogen receptor positive status [ER+]: Principal | ICD-10-CM

## 2017-01-18 DIAGNOSIS — C50912 Malignant neoplasm of unspecified site of left female breast: Secondary | ICD-10-CM | POA: Insufficient documentation

## 2017-01-18 DIAGNOSIS — Z888 Allergy status to other drugs, medicaments and biological substances status: Secondary | ICD-10-CM | POA: Diagnosis not present

## 2017-01-18 DIAGNOSIS — B369 Superficial mycosis, unspecified: Secondary | ICD-10-CM | POA: Insufficient documentation

## 2017-01-18 DIAGNOSIS — M1990 Unspecified osteoarthritis, unspecified site: Secondary | ICD-10-CM | POA: Insufficient documentation

## 2017-01-18 DIAGNOSIS — Z800 Family history of malignant neoplasm of digestive organs: Secondary | ICD-10-CM | POA: Insufficient documentation

## 2017-01-18 DIAGNOSIS — M8580 Other specified disorders of bone density and structure, unspecified site: Secondary | ICD-10-CM

## 2017-01-18 DIAGNOSIS — Z91013 Allergy to seafood: Secondary | ICD-10-CM | POA: Insufficient documentation

## 2017-01-18 DIAGNOSIS — N179 Acute kidney failure, unspecified: Secondary | ICD-10-CM | POA: Diagnosis not present

## 2017-01-18 DIAGNOSIS — Z87891 Personal history of nicotine dependence: Secondary | ICD-10-CM | POA: Diagnosis not present

## 2017-01-18 DIAGNOSIS — Z823 Family history of stroke: Secondary | ICD-10-CM | POA: Insufficient documentation

## 2017-01-18 DIAGNOSIS — E785 Hyperlipidemia, unspecified: Secondary | ICD-10-CM | POA: Diagnosis not present

## 2017-01-18 DIAGNOSIS — I4891 Unspecified atrial fibrillation: Secondary | ICD-10-CM | POA: Insufficient documentation

## 2017-01-18 DIAGNOSIS — N189 Chronic kidney disease, unspecified: Secondary | ICD-10-CM

## 2017-01-18 DIAGNOSIS — Z803 Family history of malignant neoplasm of breast: Secondary | ICD-10-CM | POA: Insufficient documentation

## 2017-01-18 DIAGNOSIS — Z780 Asymptomatic menopausal state: Secondary | ICD-10-CM

## 2017-01-18 DIAGNOSIS — Z79899 Other long term (current) drug therapy: Secondary | ICD-10-CM

## 2017-01-18 LAB — COMPREHENSIVE METABOLIC PANEL
ALBUMIN: 3.7 g/dL (ref 3.5–5.0)
ALT: 27 U/L (ref 14–54)
AST: 25 U/L (ref 15–41)
Alkaline Phosphatase: 64 U/L (ref 38–126)
Anion gap: 8 (ref 5–15)
BILIRUBIN TOTAL: 0.5 mg/dL (ref 0.3–1.2)
BUN: 20 mg/dL (ref 6–20)
CHLORIDE: 99 mmol/L — AB (ref 101–111)
CO2: 30 mmol/L (ref 22–32)
CREATININE: 1.3 mg/dL — AB (ref 0.44–1.00)
Calcium: 9.2 mg/dL (ref 8.9–10.3)
GFR calc Af Amer: 48 mL/min — ABNORMAL LOW (ref >60)
GFR, EST NON AFRICAN AMERICAN: 41 mL/min — AB (ref >60)
GLUCOSE: 169 mg/dL — AB (ref 65–99)
Potassium: 3.6 mmol/L (ref 3.5–5.1)
Sodium: 137 mmol/L (ref 135–145)
TOTAL PROTEIN: 7.7 g/dL (ref 6.5–8.1)

## 2017-01-18 LAB — CBC WITH DIFFERENTIAL/PLATELET
BASOS ABS: 0.1 10*3/uL (ref 0.0–0.1)
Basophils Relative: 1 %
Eosinophils Absolute: 0.2 10*3/uL (ref 0.0–0.7)
Eosinophils Relative: 2 %
HEMATOCRIT: 31.5 % — AB (ref 36.0–46.0)
HEMOGLOBIN: 10.1 g/dL — AB (ref 12.0–15.0)
LYMPHS PCT: 14 %
Lymphs Abs: 1 10*3/uL (ref 0.7–4.0)
MCH: 28.9 pg (ref 26.0–34.0)
MCHC: 32.1 g/dL (ref 30.0–36.0)
MCV: 90 fL (ref 78.0–100.0)
MONO ABS: 0.4 10*3/uL (ref 0.1–1.0)
Monocytes Relative: 5 %
NEUTROS ABS: 5.5 10*3/uL (ref 1.7–7.7)
NEUTROS PCT: 78 %
Platelets: 342 10*3/uL (ref 150–400)
RBC: 3.5 MIL/uL — ABNORMAL LOW (ref 3.87–5.11)
RDW: 15.7 % — ABNORMAL HIGH (ref 11.5–15.5)
WBC: 7.1 10*3/uL (ref 4.0–10.5)

## 2017-01-18 LAB — VITAMIN B12: Vitamin B-12: 830 pg/mL (ref 180–914)

## 2017-01-18 LAB — FERRITIN: FERRITIN: 54 ng/mL (ref 11–307)

## 2017-01-18 LAB — IRON AND TIBC
IRON: 50 ug/dL (ref 28–170)
Saturation Ratios: 13 % (ref 10.4–31.8)
TIBC: 381 ug/dL (ref 250–450)
UIBC: 331 ug/dL

## 2017-01-18 MED ORDER — NYSTATIN 100000 UNIT/GM EX POWD: Freq: Two times a day (BID) | CUTANEOUS | 1 refills | Status: DC | PRN

## 2017-01-18 MED ORDER — DENOSUMAB 60 MG/ML ~~LOC~~ SOLN
60.0000 mg | Freq: Once | SUBCUTANEOUS | Status: AC
  Administered 2017-01-18: 60 mg via SUBCUTANEOUS
  Filled 2017-01-18: qty 1

## 2017-01-18 MED ORDER — ANASTROZOLE 1 MG PO TABS: 1.0000 mg | ORAL_TABLET | Freq: Every day | ORAL | 5 refills | Status: DC

## 2017-01-18 MED ORDER — TEMAZEPAM 30 MG PO CAPS: 30.0000 mg | ORAL_CAPSULE | Freq: Every evening | ORAL | 0 refills | Status: DC | PRN

## 2017-01-18 NOTE — Progress Notes (Signed)
[name] presents today for injection per MD orders. Prolia 60mg  administered SQ in left Abdomen. Administration without incident. Patient tolerated well.  Pt has not had any dental work done such as a root canal or tooth extraction and is not planning on having this done in the future. Pt states that her dentist is aware that she is taking Prolia. Pt is taking Calicum and Vitamin D everyday.

## 2017-01-18 NOTE — Pre-Procedure Instructions (Signed)
Mammogram-[facility]

## 2017-01-18 NOTE — Patient Instructions (Addendum)
[place] at [facility] Discharge Instructions  RECOMMENDATIONS MADE BY THE CONSULTANT AND ANY TEST RESULTS WILL BE SENT TO YOUR REFERRING PHYSICIAN.  You were seen today by Mike Craze NP. Rx given for Restoril, DO NOT TAKE Xanax and Restoril at the same time. DEXA scan due in August of this year.  Mammogram due in April of next year. Return in 6 months for labs and a follow up.    Thank you for choosing [place] at [facility] to provide your oncology and hematology care.  To afford each patient quality time with our provider, please arrive at least 15 minutes before your scheduled appointment time.    If you have a lab appointment with the [place] please come in thru the  Main Entrance and check in at the main information desk  You need to re-schedule your appointment should you arrive 10 or more minutes late.  We strive to give you quality time with our providers, and arriving late affects you and other patients whose appointments are after yours.  Also, if you no show three or more times for appointments you may be dismissed from the clinic at the providers discretion.     Again, thank you for choosing [facility].  Our hope is that these requests will decrease the amount of time that you wait before being seen by our physicians.       _____________________________________________________________  Should you have questions after your visit to [facility], please contact our office at [phone] between the hours of 8:30 a.m. and 4:30 p.m.  Voicemails left after 4:30 p.m. will not be returned until the following business day.  For prescription refill requests, have your pharmacy contact our office.       Resources For Cancer Patients and their Caregivers ? American Cancer Society: Can assist with transportation, wigs, general needs, runs Look Good Feel Better.        979-886-4569 ? Cancer  Care: Provides financial assistance, online support groups, medication/co-pay assistance.  1-800-813-HOPE 205 665 2917) ? [place] Assists [place] cancer patients and their families through emotional , educational and financial support.  (660)088-4383 ? [place] DSS Where to apply for food stamps, Medicaid and utility assistance. (905)401-7933 ? RCATS: Transportation to medical appointments. 434-583-2514 ? Social Security Administration: May apply for disability if have a Stage IV cancer. (680) 248-6712 8033369548 ? [employer], Disability and Transit Services: Assists with nutrition, care and transit needs. [place] Support Programs: @10RELATIVEDAYS @ > Cancer Support Group  2nd Tuesday of the month 1pm-2pm, Journey Room  > Creative Journey  3rd Tuesday of the month 1130am-1pm, Journey Room  > Look Good Feel Better  1st Wednesday of the month 10am-12 noon, Journey Room (Call [place] to register [phone])

## 2017-01-18 NOTE — Addendum Note (Signed)
Addended by: Gerhard Perches on: 01/18/2017 01:46 PM   Modules accepted: Orders

## 2017-01-22 ENCOUNTER — Other Ambulatory Visit (HOSPITAL_COMMUNITY): Payer: Self-pay | Admitting: Oncology

## 2017-01-30 ENCOUNTER — Ambulatory Visit (HOSPITAL_COMMUNITY): Payer: Medicare Other

## 2017-01-31 ENCOUNTER — Encounter (HOSPITAL_COMMUNITY): Payer: Medicare Other | Attending: Adult Health

## 2017-01-31 ENCOUNTER — Encounter (HOSPITAL_COMMUNITY): Payer: Self-pay

## 2017-01-31 VITALS — BP 155/62 | HR 65 | Temp 98.0°F | Resp 20

## 2017-01-31 DIAGNOSIS — Z803 Family history of malignant neoplasm of breast: Secondary | ICD-10-CM | POA: Insufficient documentation

## 2017-01-31 DIAGNOSIS — Z833 Family history of diabetes mellitus: Secondary | ICD-10-CM | POA: Insufficient documentation

## 2017-01-31 DIAGNOSIS — Z170 Estrogen receptor positive status [ER+]: Secondary | ICD-10-CM | POA: Insufficient documentation

## 2017-01-31 DIAGNOSIS — Z9889 Other specified postprocedural states: Secondary | ICD-10-CM | POA: Insufficient documentation

## 2017-01-31 DIAGNOSIS — E785 Hyperlipidemia, unspecified: Secondary | ICD-10-CM | POA: Insufficient documentation

## 2017-01-31 DIAGNOSIS — Z96659 Presence of unspecified artificial knee joint: Secondary | ICD-10-CM | POA: Insufficient documentation

## 2017-01-31 DIAGNOSIS — M1990 Unspecified osteoarthritis, unspecified site: Secondary | ICD-10-CM | POA: Insufficient documentation

## 2017-01-31 DIAGNOSIS — M8580 Other specified disorders of bone density and structure, unspecified site: Secondary | ICD-10-CM | POA: Insufficient documentation

## 2017-01-31 DIAGNOSIS — G4700 Insomnia, unspecified: Secondary | ICD-10-CM | POA: Insufficient documentation

## 2017-01-31 DIAGNOSIS — N179 Acute kidney failure, unspecified: Secondary | ICD-10-CM | POA: Insufficient documentation

## 2017-01-31 DIAGNOSIS — C50212 Malignant neoplasm of upper-inner quadrant of left female breast: Secondary | ICD-10-CM

## 2017-01-31 DIAGNOSIS — Z888 Allergy status to other drugs, medicaments and biological substances status: Secondary | ICD-10-CM | POA: Insufficient documentation

## 2017-01-31 DIAGNOSIS — Z800 Family history of malignant neoplasm of digestive organs: Secondary | ICD-10-CM | POA: Insufficient documentation

## 2017-01-31 DIAGNOSIS — D509 Iron deficiency anemia, unspecified: Secondary | ICD-10-CM

## 2017-01-31 DIAGNOSIS — Z823 Family history of stroke: Secondary | ICD-10-CM | POA: Insufficient documentation

## 2017-01-31 DIAGNOSIS — Z79811 Long term (current) use of aromatase inhibitors: Secondary | ICD-10-CM | POA: Insufficient documentation

## 2017-01-31 DIAGNOSIS — Z7984 Long term (current) use of oral hypoglycemic drugs: Secondary | ICD-10-CM | POA: Insufficient documentation

## 2017-01-31 DIAGNOSIS — B369 Superficial mycosis, unspecified: Secondary | ICD-10-CM | POA: Insufficient documentation

## 2017-01-31 DIAGNOSIS — Z801 Family history of malignant neoplasm of trachea, bronchus and lung: Secondary | ICD-10-CM | POA: Insufficient documentation

## 2017-01-31 DIAGNOSIS — G43909 Migraine, unspecified, not intractable, without status migrainosus: Secondary | ICD-10-CM | POA: Insufficient documentation

## 2017-01-31 DIAGNOSIS — I4891 Unspecified atrial fibrillation: Secondary | ICD-10-CM | POA: Insufficient documentation

## 2017-01-31 DIAGNOSIS — Z86010 Personal history of colonic polyps: Secondary | ICD-10-CM | POA: Insufficient documentation

## 2017-01-31 DIAGNOSIS — M797 Fibromyalgia: Secondary | ICD-10-CM | POA: Insufficient documentation

## 2017-01-31 DIAGNOSIS — D649 Anemia, unspecified: Secondary | ICD-10-CM | POA: Insufficient documentation

## 2017-01-31 DIAGNOSIS — Z9012 Acquired absence of left breast and nipple: Secondary | ICD-10-CM | POA: Insufficient documentation

## 2017-01-31 DIAGNOSIS — I10 Essential (primary) hypertension: Secondary | ICD-10-CM | POA: Insufficient documentation

## 2017-01-31 DIAGNOSIS — Z7902 Long term (current) use of antithrombotics/antiplatelets: Secondary | ICD-10-CM | POA: Insufficient documentation

## 2017-01-31 DIAGNOSIS — Z87891 Personal history of nicotine dependence: Secondary | ICD-10-CM | POA: Insufficient documentation

## 2017-01-31 DIAGNOSIS — Z91013 Allergy to seafood: Secondary | ICD-10-CM | POA: Insufficient documentation

## 2017-01-31 DIAGNOSIS — K219 Gastro-esophageal reflux disease without esophagitis: Secondary | ICD-10-CM | POA: Insufficient documentation

## 2017-01-31 DIAGNOSIS — Z836 Family history of other diseases of the respiratory system: Secondary | ICD-10-CM | POA: Insufficient documentation

## 2017-01-31 DIAGNOSIS — G4730 Sleep apnea, unspecified: Secondary | ICD-10-CM | POA: Insufficient documentation

## 2017-01-31 DIAGNOSIS — Z8249 Family history of ischemic heart disease and other diseases of the circulatory system: Secondary | ICD-10-CM | POA: Insufficient documentation

## 2017-01-31 DIAGNOSIS — J449 Chronic obstructive pulmonary disease, unspecified: Secondary | ICD-10-CM | POA: Insufficient documentation

## 2017-01-31 DIAGNOSIS — C50912 Malignant neoplasm of unspecified site of left female breast: Secondary | ICD-10-CM | POA: Insufficient documentation

## 2017-01-31 DIAGNOSIS — Z79899 Other long term (current) drug therapy: Secondary | ICD-10-CM

## 2017-01-31 MED ORDER — FERUMOXYTOL INJECTION 510 MG/17 ML
510.0000 mg | Freq: Once | INTRAVENOUS | Status: AC
  Administered 2017-01-31: 510 mg via INTRAVENOUS
  Filled 2017-01-31: qty 17

## 2017-01-31 MED ORDER — SODIUM CHLORIDE 0.9 % IV SOLN
Freq: Once | INTRAVENOUS | Status: AC
  Administered 2017-01-31: 14:00:00 via INTRAVENOUS

## 2017-01-31 NOTE — Progress Notes (Signed)
Feraheme given today per orders, patient tolerated it well no issues. Vitals stable and discharged home from clinic ambulatory.

## 2017-01-31 NOTE — Patient Instructions (Signed)
[facility] at [facility] Discharge Instructions  RECOMMENDATIONS MADE BY THE CONSULTANT AND ANY TEST RESULTS WILL BE SENT TO YOUR REFERRING PHYSICIAN.  Feraheme given today Follow up as scheduled.  Thank you for choosing [facility] at [facility] to provide your oncology and hematology care.  To afford each patient quality time with our provider, please arrive at least 15 minutes before your scheduled appointment time.    If you have a lab appointment with the Cancer Center please come in thru the  Main Entrance and check in at the main information desk  You need to re-schedule your appointment should you arrive 10 or more minutes late.  We strive to give you quality time with our providers, and arriving late affects you and other patients whose appointments are after yours.  Also, if you no show three or more times for appointments you may be dismissed from the clinic at the providers discretion.     Again, thank you for choosing [facility].  Our hope is that these requests will decrease the amount of time that you wait before being seen by our physicians.       _____________________________________________________________  Should you have questions after your visit to [facility], please contact our office at [phone] between the hours of 8:30 a.m. and 4:30 p.m.  Voicemails left after 4:30 p.m. will not be returned until the following business day.  For prescription refill requests, have your pharmacy contact our office.       Resources For Cancer Patients and their Caregivers ? American Cancer Society: Can assist with transportation, wigs, general needs, runs Look Good Feel Better.        1-888-227-6333 ? Cancer Care: Provides financial assistance, online support groups, medication/co-pay assistance.  1-800-813-HOPE (4673) ? [facility] Assists [place] cancer patients and  their families through emotional , educational and financial support.  336-427-4357 ? [place] DSS Where to apply for food stamps, Medicaid and utility assistance. 336-342-1394 ? RCATS: Transportation to medical appointments. 336-347-2287 ? Social Security Administration: May apply for disability if have a Stage IV cancer. 336-342-7796 1-800-772-1213 ? [place] Aging, Disability and Transit Services: Assists with nutrition, care and transit needs. 336-349-2343  Cancer Center Support Programs: @10RELATIVEDAYS@ > Cancer Support Group  2nd Tuesday of the month 1pm-2pm, Journey Room  > Creative Journey  3rd Tuesday of the month 1130am-1pm, Journey Room  > Look Good Feel Better  1st Wednesday of the month 10am-12 noon, Journey Room (Call American Cancer Society to register 1-800-395-5775)   

## 2017-02-06 ENCOUNTER — Ambulatory Visit (HOSPITAL_COMMUNITY): Payer: Medicare Other

## 2017-02-07 ENCOUNTER — Ambulatory Visit (HOSPITAL_COMMUNITY): Payer: Medicare Other

## 2017-02-07 ENCOUNTER — Ambulatory Visit: Payer: Medicare Other | Admitting: Adult Health

## 2017-02-12 ENCOUNTER — Ambulatory Visit (HOSPITAL_COMMUNITY): Payer: Medicare Other

## 2017-02-14 ENCOUNTER — Ambulatory Visit (HOSPITAL_COMMUNITY): Payer: Medicare Other

## 2017-02-23 ENCOUNTER — Ambulatory Visit (HOSPITAL_COMMUNITY): Payer: Medicare Other

## 2017-03-02 ENCOUNTER — Ambulatory Visit (HOSPITAL_COMMUNITY): Payer: Medicare Other

## 2017-03-08 ENCOUNTER — Ambulatory Visit (HOSPITAL_COMMUNITY): Payer: Medicare Other

## 2017-03-09 ENCOUNTER — Ambulatory Visit (HOSPITAL_COMMUNITY): Payer: Medicare Other

## 2017-03-14 ENCOUNTER — Encounter (HOSPITAL_COMMUNITY): Payer: Medicare Other

## 2017-03-22 ENCOUNTER — Encounter (HOSPITAL_COMMUNITY): Payer: Self-pay | Admitting: Adult Health

## 2017-03-22 ENCOUNTER — Other Ambulatory Visit (HOSPITAL_COMMUNITY): Payer: Self-pay | Admitting: Adult Health

## 2017-03-22 ENCOUNTER — Ambulatory Visit (HOSPITAL_COMMUNITY): Payer: Medicare Other

## 2017-03-22 DIAGNOSIS — G4700 Insomnia, unspecified: Secondary | ICD-10-CM

## 2017-03-22 DIAGNOSIS — C50212 Malignant neoplasm of upper-inner quadrant of left female breast: Secondary | ICD-10-CM

## 2017-03-22 DIAGNOSIS — Z170 Estrogen receptor positive status [ER+]: Principal | ICD-10-CM

## 2017-03-22 MED ORDER — TEMAZEPAM 30 MG PO CAPS: 30.0000 mg | ORAL_CAPSULE | Freq: Every evening | ORAL | 2 refills | Status: DC | PRN

## 2017-03-22 NOTE — Progress Notes (Signed)
Received refill request from pharmacy for: Temazepam.   [place] Controlled Substance Reporting System reviewed and refill is appropriate on or after 03/22/17. Paper prescription printed & post-dated; Rx given to Jaynie Collins, LPN to fax to pharmacy.   NCCSRS reviewed:     Mike Craze, NP [place] [phone]

## 2017-03-26 ENCOUNTER — Ambulatory Visit (HOSPITAL_COMMUNITY): Payer: Medicare Other

## 2017-03-30 ENCOUNTER — Encounter (HOSPITAL_COMMUNITY): Payer: Self-pay | Admitting: Adult Health

## 2017-03-30 ENCOUNTER — Encounter (HOSPITAL_COMMUNITY): Payer: Medicare Other | Attending: Adult Health

## 2017-03-30 ENCOUNTER — Other Ambulatory Visit (HOSPITAL_COMMUNITY): Payer: Self-pay | Admitting: Adult Health

## 2017-03-30 DIAGNOSIS — Z91013 Allergy to seafood: Secondary | ICD-10-CM | POA: Diagnosis not present

## 2017-03-30 DIAGNOSIS — G43909 Migraine, unspecified, not intractable, without status migrainosus: Secondary | ICD-10-CM | POA: Insufficient documentation

## 2017-03-30 DIAGNOSIS — Z888 Allergy status to other drugs, medicaments and biological substances status: Secondary | ICD-10-CM | POA: Insufficient documentation

## 2017-03-30 DIAGNOSIS — M797 Fibromyalgia: Secondary | ICD-10-CM | POA: Insufficient documentation

## 2017-03-30 DIAGNOSIS — Z9889 Other specified postprocedural states: Secondary | ICD-10-CM | POA: Diagnosis not present

## 2017-03-30 DIAGNOSIS — D649 Anemia, unspecified: Secondary | ICD-10-CM | POA: Insufficient documentation

## 2017-03-30 DIAGNOSIS — K219 Gastro-esophageal reflux disease without esophagitis: Secondary | ICD-10-CM | POA: Diagnosis not present

## 2017-03-30 DIAGNOSIS — G4700 Insomnia, unspecified: Secondary | ICD-10-CM | POA: Insufficient documentation

## 2017-03-30 DIAGNOSIS — D500 Iron deficiency anemia secondary to blood loss (chronic): Secondary | ICD-10-CM

## 2017-03-30 DIAGNOSIS — Z800 Family history of malignant neoplasm of digestive organs: Secondary | ICD-10-CM | POA: Insufficient documentation

## 2017-03-30 DIAGNOSIS — Z833 Family history of diabetes mellitus: Secondary | ICD-10-CM | POA: Insufficient documentation

## 2017-03-30 DIAGNOSIS — E785 Hyperlipidemia, unspecified: Secondary | ICD-10-CM | POA: Insufficient documentation

## 2017-03-30 DIAGNOSIS — Z86010 Personal history of colonic polyps: Secondary | ICD-10-CM | POA: Diagnosis not present

## 2017-03-30 DIAGNOSIS — Z79811 Long term (current) use of aromatase inhibitors: Secondary | ICD-10-CM | POA: Diagnosis not present

## 2017-03-30 DIAGNOSIS — J449 Chronic obstructive pulmonary disease, unspecified: Secondary | ICD-10-CM | POA: Insufficient documentation

## 2017-03-30 DIAGNOSIS — Z7902 Long term (current) use of antithrombotics/antiplatelets: Secondary | ICD-10-CM | POA: Insufficient documentation

## 2017-03-30 DIAGNOSIS — B369 Superficial mycosis, unspecified: Secondary | ICD-10-CM | POA: Diagnosis not present

## 2017-03-30 DIAGNOSIS — Z87891 Personal history of nicotine dependence: Secondary | ICD-10-CM | POA: Insufficient documentation

## 2017-03-30 DIAGNOSIS — Z9012 Acquired absence of left breast and nipple: Secondary | ICD-10-CM | POA: Insufficient documentation

## 2017-03-30 DIAGNOSIS — C50912 Malignant neoplasm of unspecified site of left female breast: Secondary | ICD-10-CM | POA: Diagnosis not present

## 2017-03-30 DIAGNOSIS — I10 Essential (primary) hypertension: Secondary | ICD-10-CM | POA: Insufficient documentation

## 2017-03-30 DIAGNOSIS — M8580 Other specified disorders of bone density and structure, unspecified site: Secondary | ICD-10-CM | POA: Insufficient documentation

## 2017-03-30 DIAGNOSIS — N179 Acute kidney failure, unspecified: Secondary | ICD-10-CM | POA: Insufficient documentation

## 2017-03-30 DIAGNOSIS — M1990 Unspecified osteoarthritis, unspecified site: Secondary | ICD-10-CM | POA: Insufficient documentation

## 2017-03-30 DIAGNOSIS — Z7984 Long term (current) use of oral hypoglycemic drugs: Secondary | ICD-10-CM | POA: Insufficient documentation

## 2017-03-30 DIAGNOSIS — I4891 Unspecified atrial fibrillation: Secondary | ICD-10-CM | POA: Insufficient documentation

## 2017-03-30 DIAGNOSIS — G4730 Sleep apnea, unspecified: Secondary | ICD-10-CM | POA: Insufficient documentation

## 2017-03-30 DIAGNOSIS — Z8249 Family history of ischemic heart disease and other diseases of the circulatory system: Secondary | ICD-10-CM | POA: Insufficient documentation

## 2017-03-30 DIAGNOSIS — Z96659 Presence of unspecified artificial knee joint: Secondary | ICD-10-CM | POA: Insufficient documentation

## 2017-03-30 DIAGNOSIS — Z836 Family history of other diseases of the respiratory system: Secondary | ICD-10-CM | POA: Insufficient documentation

## 2017-03-30 DIAGNOSIS — Z803 Family history of malignant neoplasm of breast: Secondary | ICD-10-CM | POA: Insufficient documentation

## 2017-03-30 DIAGNOSIS — Z170 Estrogen receptor positive status [ER+]: Secondary | ICD-10-CM | POA: Insufficient documentation

## 2017-03-30 DIAGNOSIS — Z801 Family history of malignant neoplasm of trachea, bronchus and lung: Secondary | ICD-10-CM | POA: Insufficient documentation

## 2017-03-30 DIAGNOSIS — Z823 Family history of stroke: Secondary | ICD-10-CM | POA: Insufficient documentation

## 2017-03-30 LAB — CBC WITH DIFFERENTIAL/PLATELET
BASOS ABS: 0 10*3/uL (ref 0.0–0.1)
Basophils Relative: 1 %
EOS PCT: 2 %
Eosinophils Absolute: 0.2 10*3/uL (ref 0.0–0.7)
HEMATOCRIT: 31.1 % — AB (ref 36.0–46.0)
Hemoglobin: 9.9 g/dL — ABNORMAL LOW (ref 12.0–15.0)
LYMPHS ABS: 1.2 10*3/uL (ref 0.7–4.0)
LYMPHS PCT: 17 %
MCH: 28.9 pg (ref 26.0–34.0)
MCHC: 31.8 g/dL (ref 30.0–36.0)
MCV: 90.7 fL (ref 78.0–100.0)
MONO ABS: 0.5 10*3/uL (ref 0.1–1.0)
MONOS PCT: 7 %
NEUTROS ABS: 4.9 10*3/uL (ref 1.7–7.7)
Neutrophils Relative %: 73 %
PLATELETS: 312 10*3/uL (ref 150–400)
RBC: 3.43 MIL/uL — ABNORMAL LOW (ref 3.87–5.11)
RDW: 16.7 % — AB (ref 11.5–15.5)
WBC: 6.7 10*3/uL (ref 4.0–10.5)

## 2017-03-30 LAB — IRON AND TIBC
Iron: 62 ug/dL (ref 28–170)
SATURATION RATIOS: 18 % (ref 10.4–31.8)
TIBC: 351 ug/dL (ref 250–450)
UIBC: 289 ug/dL

## 2017-03-30 LAB — FERRITIN: Ferritin: 102 ng/mL (ref 11–307)

## 2017-03-30 NOTE — Progress Notes (Signed)
Patient presented to cancer center today without an appt requesting IV iron infusion.  Previous iron infusions were rescheduled and patient either no-showed or cancelled her appts.  Discussed with Ebony Hail, RN.  Unfortunately, we cannot accommodate patient today.  Her last iron studies were completed in late April, so she needs repeat labs before we can determine her iron deficit.  Orders placed for CBC with diff, ferritin, & iron/TIBC.  Requested that she have labs today and make an infusion appt for sometime next week when we have her lab results.  If she does not need IV iron, then we will call her and let her know when we get her lab results.    Orders placed for labs. Awaiting results before re-building treatment plan.   Mike Craze, NP [facility] [phone]

## 2017-04-06 ENCOUNTER — Ambulatory Visit (HOSPITAL_COMMUNITY): Payer: Medicare Other

## 2017-04-24 ENCOUNTER — Other Ambulatory Visit (HOSPITAL_COMMUNITY): Payer: Self-pay | Admitting: Oncology

## 2017-04-30 ENCOUNTER — Other Ambulatory Visit (HOSPITAL_COMMUNITY): Payer: Medicare Other

## 2017-05-03 ENCOUNTER — Encounter (HOSPITAL_COMMUNITY): Payer: Self-pay

## 2017-05-03 ENCOUNTER — Emergency Department (HOSPITAL_COMMUNITY)
Admission: EM | Admit: 2017-05-03 | Discharge: 2017-05-03 | Disposition: A | Discharge status: Home / Self Care | Payer: Medicare Other | Attending: Emergency Medicine | Admitting: Emergency Medicine

## 2017-05-03 DIAGNOSIS — C50212 Malignant neoplasm of upper-inner quadrant of left female breast: Secondary | ICD-10-CM | POA: Insufficient documentation

## 2017-05-03 DIAGNOSIS — Z96653 Presence of artificial knee joint, bilateral: Secondary | ICD-10-CM | POA: Diagnosis not present

## 2017-05-03 DIAGNOSIS — J449 Chronic obstructive pulmonary disease, unspecified: Secondary | ICD-10-CM | POA: Insufficient documentation

## 2017-05-03 DIAGNOSIS — S8992XA Unspecified injury of left lower leg, initial encounter: Secondary | ICD-10-CM | POA: Diagnosis present

## 2017-05-03 DIAGNOSIS — Y9301 Activity, walking, marching and hiking: Secondary | ICD-10-CM | POA: Insufficient documentation

## 2017-05-03 DIAGNOSIS — Z79899 Other long term (current) drug therapy: Secondary | ICD-10-CM | POA: Diagnosis not present

## 2017-05-03 DIAGNOSIS — Y999 Unspecified external cause status: Secondary | ICD-10-CM | POA: Insufficient documentation

## 2017-05-03 DIAGNOSIS — Z7901 Long term (current) use of anticoagulants: Secondary | ICD-10-CM | POA: Insufficient documentation

## 2017-05-03 DIAGNOSIS — W01198A Fall on same level from slipping, tripping and stumbling with subsequent striking against other object, initial encounter: Secondary | ICD-10-CM | POA: Diagnosis not present

## 2017-05-03 DIAGNOSIS — S81012A Laceration without foreign body, left knee, initial encounter: Secondary | ICD-10-CM | POA: Diagnosis not present

## 2017-05-03 DIAGNOSIS — Y92000 Kitchen of unspecified non-institutional (private) residence as  the place of occurrence of the external cause: Secondary | ICD-10-CM | POA: Insufficient documentation

## 2017-05-03 DIAGNOSIS — Z7984 Long term (current) use of oral hypoglycemic drugs: Secondary | ICD-10-CM | POA: Diagnosis not present

## 2017-05-03 DIAGNOSIS — J45909 Unspecified asthma, uncomplicated: Secondary | ICD-10-CM | POA: Insufficient documentation

## 2017-05-03 DIAGNOSIS — I10 Essential (primary) hypertension: Secondary | ICD-10-CM | POA: Diagnosis not present

## 2017-05-03 DIAGNOSIS — Z87891 Personal history of nicotine dependence: Secondary | ICD-10-CM | POA: Diagnosis not present

## 2017-05-03 DIAGNOSIS — E119 Type 2 diabetes mellitus without complications: Secondary | ICD-10-CM | POA: Diagnosis not present

## 2017-05-03 MED ORDER — SILVER NITRATE-POT NITRATE 75-25 % EX MISC
1.0000 | Freq: Once | CUTANEOUS | Status: AC
  Administered 2017-05-03: 1 via TOPICAL
  Filled 2017-05-03: qty 1

## 2017-05-03 MED ORDER — LIDOCAINE HCL (PF) 1 % IJ SOLN
5.0000 mL | Freq: Once | INTRAMUSCULAR | Status: AC
  Administered 2017-05-03: 5 mL
  Filled 2017-05-03: qty 5

## 2017-05-03 MED ORDER — CEPHALEXIN 500 MG PO CAPS: 500.0000 mg | ORAL_CAPSULE | Freq: Four times a day (QID) | ORAL | 0 refills | Status: DC

## 2017-05-03 MED ORDER — POVIDONE-IODINE 10 % EX SOLN
CUTANEOUS | Status: DC | PRN
  Administered 2017-05-03: 1 via TOPICAL
  Filled 2017-05-03: qty 15

## 2017-05-03 NOTE — Discharge Instructions (Signed)
Keep your wound site, clean, dry and covered if there is any risk of contamination ( you may leave it open when simply resting in your home).  Use the ace wrap or the brace you suggested to help minimize knee flexion until this wound has healed.

## 2017-05-03 NOTE — ED Triage Notes (Signed)
Pt reports she tripped over something last night and fell.  Reports knee hit ceramic tile and pt has laceration to left knee.  Pt attempted to "butterfly" the wound closed but couldn't get the wound to stop bleeding.  Area dressed in triage with telfa and kerlix.

## 2017-05-06 NOTE — ED Provider Notes (Signed)
[facility] DEPT Provider Note   CSN: [id number] Arrival date & time: 05/03/17  1551     History   Chief Complaint Chief Complaint  Patient presents with  . Extremity Laceration    HPI [name] is a 66 y.o. female who is on Xarelto due to atrial fibrillation, sustained a laceration of her left knee last night when she tripped in her home and hit her knee against the corner of ceramic tile in her kitchen.  She cleaned the wound using soap and water and applied butterfly dressings but has been unable to get the wound to stop bleeding. She denies other injury, no head injury, no neck or back pain and really has no knee pain or disability with weight bearing and ambulation.  Her tetanus is utd.  The history is provided by the patient.    Past Medical History:  Diagnosis Date  . Acute renal failure ([place]) 01/27/2015  . Acute respiratory failure ([place]) 01/27/2015  . Adenomatous colon polyp 1993  . Allergy   . Anemia   . Anxiety   . Anxiety disorder   . Asthma    Spirometry 1989: FEV1 2.12 (71%) with ratio 82.  HFA 90% after coaching 11-09-10  . Atrial flutter ([place]) chadsvasc of 3 (11/19/14)   Status post cardioversion 2-12 2013. s/p RFCA 5/13 with JAllred (coumadin and amio stopped)   . Breast cancer ([place])    left  . COPD (chronic obstructive pulmonary disease) ([place])   . DDD (degenerative disc disease)   . Diverticulosis   . Essential hypertension, benign   . Fibromyalgia   . GERD (gastroesophageal reflux disease)    EGD with HH / esophagitis 06-30-93  . Hiatal hernia   . History of atrial fibrillation   . Hyperlipidemia   . Internal and external hemorrhoids without complication   . Lipoma of colon   . Migraine   . Osteoarthritis   . Pelvic fracture ([place])   . Personal history of colonic polyps 06/14/2010  . Pneumonia   . PONV (postoperative nausea and vomiting)   . Radiation    left breast cancer  . Sleep apnea    cpap    Patient Active Problem List   Diagnosis Date Noted  . Osteopenia determined by x-ray 07/02/2015  . High risk medication use 07/02/2015  . Breast cancer of upper-inner quadrant of left female breast ([place]) 01/26/2015  . Shortness of breath 04/09/2014  . DM2 (diabetes mellitus, type 2) ([place]) 02/20/2014  . Atrial fibrillation ([place]) 02/19/2014  . Health care maintenance 09/26/2013  . Hypertension 10/09/2012  . Obstructive sleep apnea 01/16/2012  . Atrial flutter ([place])   . Long term (current) use of anticoagulants 11/10/2011  . Personal history of colonic polyps 06/14/2010  . Morbid (severe) obesity due to excess calories ([place]) 07/01/2008  . Cough variant asthma with prominent upper airway features  10/03/2007  . GERD 10/03/2007    Past Surgical History:  Procedure Laterality Date  . A FLUTTER ABLATION  02/22/2012  . ATRIAL FLUTTER ABLATION N/A 02/22/2012   Procedure: ATRIAL FLUTTER ABLATION;  Surgeon: Thompson Grayer, MD;  Location: [facility] CATH LAB;  Service: Cardiovascular;  Laterality: N/A;  . BREAST BIOPSY Left 11/2014  . CARDIOVERSION N/A 02/19/2014   Procedure: CARDIOVERSION;  Surgeon: Dorothy Spark, MD;  Location: [facility];  Service: Cardiovascular;  Laterality: N/A;  . COLONOSCOPY  05/2010, 07/2000  . DILATION AND CURETTAGE OF UTERUS    . ESOPHAGOGASTRODUODENOSCOPY  05/2010, 06/1993  . JOINT REPLACEMENT    .  POLYPECTOMY    . RADIOACTIVE SEED GUIDED MASTECTOMY WITH AXILLARY SENTINEL LYMPH NODE BIOPSY Left 01/26/2015   Procedure: LEFT  PARTIAL MASTECTOMY WITH  RADIOACTIVE SEED LOCALIZATION, LEFT AXILLARY SENTINEL LYMPH NODE BIOPSY;  Surgeon: Fanny Skates, MD;  Location: [facility];  Service: General;  Laterality: Left;  . ROTATOR CUFF REPAIR     bilateral  . ROTATOR CUFF REPAIR     bil  . TOTAL KNEE ARTHROPLASTY     x2 09/2006  . UPPER GASTROINTESTINAL ENDOSCOPY      OB History    No data available       Home Medications    Prior to Admission medications   Medication Sig Start Date End Date Taking? Authorizing  Provider  acetaminophen (TYLENOL) 500 MG tablet Take 1,000-1,500 mg by mouth 2 (two) times daily as needed for headache (migraines). Reported on 09/08/2015    [provider]  Albuterol Sulfate (PROAIR RESPICLICK) 024 (90 Base) MCG/ACT AEPB Inhale 2 puffs into the lungs every 4 (four) hours as needed. 12/25/16   Rigoberto Noel, MD  ALPRAZolam Duanne Moron) 0.5 MG tablet Take 0.5 mg by mouth 2 (two) times daily as needed for anxiety.  02/11/15   [provider]  anastrozole (ARIMIDEX) 1 MG tablet Take 1 tablet (1 mg total) by mouth daily. 01/18/17   Holley Bouche, NP  APPLE CIDER VINEGAR PO Take by mouth.    [provider]  atorvastatin (LIPITOR) 10 MG tablet Take 10 mg by mouth daily. 05/29/15   [provider]  bisoprolol (ZEBETA) 10 MG tablet Take 1 tablet (10 mg total) by mouth daily. 10/30/12   Parrett, Fonnie Mu, NP  budesonide-formoterol (SYMBICORT) 80-4.5 MCG/ACT inhaler inhale 2 puffs by mouth twice a day Rinse mouth after use 12/25/16   Rigoberto Noel, MD  calcium-vitamin D (OSCAL WITH D) 500-200 MG-UNIT per tablet Take 1 tablet by mouth daily with breakfast. 06/03/15   [place], Kelby Fam, MD  CARTIA XT 180 MG 24 hr capsule take 1 capsule by mouth once daily 06/30/16   Allred, Jeneen Rinks, MD  celecoxib (CELEBREX) 200 MG capsule Take 1 capsule by mouth every 3-4 days 03/31/15   [provider]  cephALEXin (KEFLEX) 500 MG capsule Take 1 capsule (500 mg total) by mouth 4 (four) times daily. 05/03/17   Evalee Jefferson, PA-C  cyclobenzaprine (FLEXERIL) 10 MG tablet Take 1 tablet by mouth 3 (three) times daily. As needed 01/18/16   [provider]  flecainide (TAMBOCOR) 50 MG tablet Take 1 tablet (50 mg total) by mouth 2 (two) times daily. 01/01/17   Sherran Needs, NP  fluticasone Asencion Islam) 50 MCG/ACT nasal spray Place 2 sprays into both nostrils daily as needed for allergies.  12/06/14   [provider]  furosemide (LASIX) 40 MG tablet Take 40 mg by mouth 3  (three) times a week.  03/16/15   [provider]  GARCINIA CAMBOGIA-CHROMIUM PO Take by mouth.    [provider]  hydrOXYzine (ATARAX/VISTARIL) 25 MG tablet Take 1 tablet by mouth every 6 (six) hours as needed. Itching 03/16/15   [provider]  losartan (COZAAR) 100 MG tablet Take 100 mg by mouth daily.  02/09/15   [provider]  metFORMIN (GLUCOPHAGE) 500 MG tablet 1 tablet daily. 12/19/16   [provider]  nystatin [facility]) powder Apply topically 2 (two) times daily as needed. 01/18/17   Holley Bouche, NP  omeprazole (PRILOSEC) 40 MG capsule take 1 capsule by mouth once  daily BEFORE BREAKFAST 12/14/16   Gatha Mayer, MD  potassium chloride (K-DUR) 10 MEQ tablet Take 1 tablet by mouth daily. 04/22/15   [provider]  Respiratory Therapy Supplies (FLUTTER) DEVI USE AS DIRECTED 02/01/16   Tanda Rockers, MD  rivaroxaban (XARELTO) 20 MG TABS tablet take 1 tablet by mouth once daily WITH SUPPER 01/01/17   Sherran Needs, NP  temazepam (RESTORIL) 30 MG capsule Take 1 capsule (30 mg total) by mouth at bedtime as needed for sleep. 03/22/17   Holley Bouche, NP  Vitamin D, Ergocalciferol, (DRISDOL) 50000 units CAPS capsule take 1 capsule by mouth every week FOR 16 WEEKS 04/24/17   Baird Cancer, PA-C    Family History Family History  Problem Relation Age of Onset  . Emphysema Father        smoker  . Heart disease Father   . Breast cancer Mother 58  . Breast cancer Sister 2  . Heart Problems Sister   . Heart disease Sister   . Breast cancer Maternal Aunt 48  . Lung cancer Maternal Uncle 20       smoker  . Colon cancer Paternal Uncle 17  . Diabetes Maternal Grandmother   . Heart Problems Maternal Grandfather   . Stroke Paternal Grandfather   . Lung cancer Maternal Uncle 72       smoker  . Heart Problems Paternal Uncle   . Diabetes Paternal Grandmother   . Rectal cancer Neg Hx   . Stomach cancer Neg Hx   . Esophageal  cancer Neg Hx     Social History Social History  Substance Use Topics  . Smoking status: Former Smoker    Packs/day: 1.00    Years: 20.00    Types: Cigarettes    Quit date: 09/25/1984  . Smokeless tobacco: Never Used  . Alcohol use No     Allergies   Shellfish allergy; Ace inhibitors; Epinephrine; Theophyllines; and Propoxyphene hcl   Review of Systems Review of Systems  Constitutional: Negative for chills and fever.  HENT: Negative.   Musculoskeletal: Negative.  Negative for arthralgias, back pain and neck pain.  Skin: Positive for wound.  Neurological: Negative.  Negative for numbness.     Physical Exam Updated Vital Signs BP (!) 155/70 (BP Location: Right Arm)   Pulse 85   Temp 98.5 F (36.9 C) (Oral)   Resp 20   Ht 5\' 5"  (1.651 m)   Wt 113.4 kg (250 lb)   SpO2 96%   BMI 41.60 kg/m   Physical Exam  Constitutional: She is oriented to person, place, and time. She appears well-developed and well-nourished.  HENT:  Head: Normocephalic.  Cardiovascular: Normal rate.   Pulmonary/Chest: Effort normal.  Musculoskeletal: She exhibits no tenderness or deformity.  Neurological: She is alert and oriented to person, place, and time. No sensory deficit.  Skin: Laceration noted.  2 cm subcutaneous laceration left mid pre-patella.  Hemostatic except for one tiny venous bleed along the medial wound edge.      ED Treatments / Results  Labs (all labs ordered are listed, but only abnormal results are displayed) Labs Reviewed - No data to display  EKG  EKG Interpretation None       Radiology No results found.  Procedures Procedures (including critical care time)  LACERATION REPAIR Performed by: Evalee Jefferson Authorized by: Evalee Jefferson Consent: Verbal consent obtained. Risks and benefits: risks, benefits and alternatives were discussed Consent given by: patient Patient identity confirmed: provided  demographic data Prepped and Draped in normal sterile  fashion Wound explored  Laceration Location: left knee  Laceration Length: 2 cm  No Foreign Bodies seen or palpated  Anesthesia: local infiltration  Local anesthetic: lidocaine 1% without epinephrine  Anesthetic total: 4 ml  Irrigation method: syringe Amount of cleaning: copious using betadine followed by saline rinse  Skin closure:  First attempted hemostasis using silver nitrate which was not effective.  Therefore, vicryl rapide 6-0 suture to site of venous bleed which did obtain hemostasis.  Given age of wound, sterile strips applied to approximate wound edges.  Number of sutures: 4 strips  Technique: sterile strips  Patient tolerance: Patient tolerated the procedure well with no immediate complications.   Medications Ordered in ED Medications  lidocaine (PF) (XYLOCAINE) 1 % injection 5 mL (5 mLs Other Given by Other 05/03/17 1812)  silver nitrate applicators applicator 1 Stick (1 Stick Topical Given by Other 05/03/17 1725)     Initial Impression / Assessment and Plan / ED Course  I have reviewed the triage vital signs and the nursing notes.  Pertinent labs & imaging results that were available during my care of the patient were reviewed by me and considered in my medical decision making (see chart for details).     Bulky dressing/ace applied by RN to protect wound and reduce ROM of knee. Discussed wound care and close watch and recheck for any signs of infection. Pt ambulatory at dc.  Final Clinical Impressions(s) / ED Diagnoses   Final diagnoses:  Laceration of left knee, initial encounter    New Prescriptions Discharge Medication List as of 05/03/2017  6:05 PM    START taking these medications   Details  cephALEXin (KEFLEX) 500 MG capsule Take 1 capsule (500 mg total) by mouth 4 (four) times daily., Starting Thu 05/03/2017, Print         Evalee Jefferson, PA-C 05/06/17 1330    Daleen Bo, MD 05/06/17 [phone]

## 2017-06-20 ENCOUNTER — Other Ambulatory Visit (HOSPITAL_COMMUNITY): Payer: Self-pay | Admitting: Nurse Practitioner

## 2017-06-20 DIAGNOSIS — I480 Paroxysmal atrial fibrillation: Secondary | ICD-10-CM

## 2017-06-27 ENCOUNTER — Ambulatory Visit: Payer: Medicare Other | Admitting: Adult Health

## 2017-06-30 ENCOUNTER — Other Ambulatory Visit: Payer: Self-pay | Admitting: Internal Medicine

## 2017-06-30 DIAGNOSIS — K219 Gastro-esophageal reflux disease without esophagitis: Secondary | ICD-10-CM

## 2017-07-03 ENCOUNTER — Inpatient Hospital Stay (HOSPITAL_COMMUNITY): Admission: RE | Admit: 2017-07-03 | Payer: Medicare Other | Source: Ambulatory Visit | Admitting: Nurse Practitioner

## 2017-07-03 ENCOUNTER — Ambulatory Visit: Payer: Medicare Other | Admitting: Adult Health

## 2017-07-04 ENCOUNTER — Inpatient Hospital Stay (HOSPITAL_COMMUNITY): Admission: RE | Admit: 2017-07-04 | Payer: Medicare Other | Source: Ambulatory Visit | Admitting: Nurse Practitioner

## 2017-07-11 ENCOUNTER — Inpatient Hospital Stay (HOSPITAL_COMMUNITY)
Admission: RE | Admit: 2017-07-11 | Discharge: 2017-07-11 | Disposition: A | Discharge status: Home / Self Care | Payer: Medicare Other | Source: Ambulatory Visit | Attending: Nurse Practitioner | Admitting: Nurse Practitioner

## 2017-07-16 ENCOUNTER — Other Ambulatory Visit (HOSPITAL_COMMUNITY): Payer: Self-pay | Admitting: Nurse Practitioner

## 2017-07-16 ENCOUNTER — Other Ambulatory Visit: Payer: Self-pay | Admitting: Pulmonary Disease

## 2017-07-16 DIAGNOSIS — I480 Paroxysmal atrial fibrillation: Secondary | ICD-10-CM

## 2017-07-20 ENCOUNTER — Ambulatory Visit (HOSPITAL_COMMUNITY): Payer: Medicare Other

## 2017-07-20 ENCOUNTER — Other Ambulatory Visit (HOSPITAL_COMMUNITY): Payer: Medicare Other

## 2017-07-20 ENCOUNTER — Ambulatory Visit (HOSPITAL_COMMUNITY): Payer: Medicare Other | Admitting: Oncology

## 2017-08-01 ENCOUNTER — Ambulatory Visit (HOSPITAL_COMMUNITY): Payer: Medicare Other

## 2017-08-01 ENCOUNTER — Other Ambulatory Visit (HOSPITAL_COMMUNITY): Payer: Medicare Other

## 2017-08-03 NOTE — Progress Notes (Deleted)
[place], [place] [address]   CLINIC:  Medical Oncology/Hematology  PCP:  [name], MD [facility] [address] [phone]   REASON FOR VISIT:  Follow-up for Stage IA left breast invasive ductal carcinoma; ER+/PR+/HER2- AND Osteopenia   CURRENT THERAPY: Anastrozole daily AND Prolia inj every 6 months  BRIEF ONCOLOGIC HISTORY:    Breast cancer of upper-inner quadrant of left female breast ([place])   12/15/2014 Mammogram    new 62m round nodule in left breast is indeterminate      12/17/2014 Imaging    6 mm lobulated nodule in the left breast is at low suspicion for malignancy. ultrasound guided biopsy recommended      12/23/2014 Procedure    ([facility]: Invasive ductal carcinoma.  ER+ (100%), PR+ (99%), HER2 neg (ratio 1.61). Ki67 45%.       01/26/2015 Surgery    (L) lumpectomy with SLNB (Dalbert Batman: IDC, grade 2, spanning 1.1 cm; also low-grade DCIS; margins negative. 0/3 left axillary SLN. HER2 repeated and remains neg. (ratio 1.33).  pT1c, pN0: Stage IA      03/19/2015 Genetic Testing    Genetic testing: Negative. Genes analyzed: ATM, BARD1, BRCA1, BRCA2, BRIP1, CDH1, CHEK2, FANCC, MLH1, MSH2, MSH6, NBN, PALB2, PMS2, PTEN, RAD51C, RAD51D, STK11, TP53, and XRCC2.  Additionally, this panel includes deletion/duplication analysis (without next-generation sequencing) of one gene, EPCAM.      03/2015 - 04/2015 Radiation Therapy    Adjuvant breast radiation with Dr. WPablo Ledger       04/30/2015 Imaging    DEXA scan: Osteopenia. (T-Score of -1.1)      05/2015 -  Anti-estrogen oral therapy    Anastrozole. Planned duration of therapy 5-10 years.       07/16/2015 Miscellaneous    Prolia injections started.         HISTORY OF PRESENT ILLNESS:  (From Dr. PDonald Porelast note on 07/20/16)     INTERVAL HISTORY:  Ms. [id number].[id number]. female returns for routine follow-up for history of left breast  cancer.   ***  Her biggest complaint today is fatigue. Reports "having no energy whatsoever."  Her appetite is good, about 75% of her baseline. Denies any fever/chills/night sweats.  She doesn't sleep well; "I can't remember the last time I slept well."  She has seasonal allergies that affect her asthma; her shortness of breath with exertion is a little worse right now as a result.  States that she "itches all the time" d/t dry skin and allergies.    Continues to tolerate the Anastrozole relatively well; endorses continued arthralgias, but she does not feel they are any worse. She does ambulate with a cane.  Denies vaginal dryness or hot flashes.  Denies any new breast complaints or concerns. She is requesting a refill of the Nystatin powder that she applies under her breasts when she breaks out in a rash.   She is due for Prolia injection today. Denies any recent invasive dental procedures/extractions.  Her mammogram is up-to-date; [facility] [place]    Otherwise, she feels well.  She is looking forward to getting back to playing pool competitively in [place]      REVIEW OF SYSTEMS:  Review of Systems  Constitutional: Positive for fatigue. Negative for chills and fever.  HENT:  Negative.  Negative for lump/mass and nosebleeds.   Eyes: Negative.  Respiratory: Positive for shortness of breath. Negative for cough.   Cardiovascular: Negative.  Negative for chest pain and leg swelling.  Gastrointestinal: Negative.  Negative for abdominal pain, blood in stool, constipation, diarrhea, nausea and vomiting.  Endocrine: Negative.   Genitourinary: Negative.  Negative for dysuria and hematuria.   Musculoskeletal: Positive for arthralgias.  Skin: Positive for rash (under bilat breasts (chronic)).  Neurological: Negative.  Negative for dizziness and headaches.  Hematological: Negative.  Negative for adenopathy. Does not bruise/bleed easily.    Psychiatric/Behavioral: Positive for sleep disturbance. Negative for depression. The patient is not nervous/anxious.      PAST MEDICAL/SURGICAL HISTORY:  Past Medical History:  Diagnosis Date  . Acute renal failure ([place]) 01/27/2015  . Acute respiratory failure ([place]) 01/27/2015  . Adenomatous colon polyp 1993  . Allergy   . Anemia   . Anxiety   . Anxiety disorder   . Asthma    Spirometry 1989: FEV1 2.12 (71%) with ratio 82.  HFA 90% after coaching 11-09-10  . Atrial flutter ([place]) chadsvasc of 3 (11/19/14)   Status post cardioversion 2-12 2013. s/p RFCA 5/13 with JAllred (coumadin and amio stopped)   . Breast cancer ([place])    left  . COPD (chronic obstructive pulmonary disease) ([place])   . DDD (degenerative disc disease)   . Diverticulosis   . Essential hypertension, benign   . Fibromyalgia   . GERD (gastroesophageal reflux disease)    EGD with HH / esophagitis 06-30-93  . Hiatal hernia   . History of atrial fibrillation   . Hyperlipidemia   . Internal and external hemorrhoids without complication   . Lipoma of colon   . Migraine   . Osteoarthritis   . Pelvic fracture ([place])   . Personal history of colonic polyps 06/14/2010  . Pneumonia   . PONV (postoperative nausea and vomiting)   . Radiation    left breast cancer  . Sleep apnea    cpap   Past Surgical History:  Procedure Laterality Date  . A FLUTTER ABLATION  02/22/2012  . BREAST BIOPSY Left 11/2014  . COLONOSCOPY  05/2010, 07/2000  . DILATION AND CURETTAGE OF UTERUS    . ESOPHAGOGASTRODUODENOSCOPY  05/2010, 06/1993  . JOINT REPLACEMENT    . POLYPECTOMY    . ROTATOR CUFF REPAIR     bilateral  . ROTATOR CUFF REPAIR     bil  . TOTAL KNEE ARTHROPLASTY     x2 09/2006  . UPPER GASTROINTESTINAL ENDOSCOPY       SOCIAL HISTORY:  Social History   Socioeconomic History  . Marital status: Widowed    Spouse name: Not on file  . Number of children: 0  . Years of education: Not on file  . Highest education level: Not on file   Social Needs  . Financial resource strain: Not on file  . Food insecurity - worry: Not on file  . Food insecurity - inability: Not on file  . Transportation needs - medical: Not on file  . Transportation needs - non-medical: Not on file  Occupational History  . Occupation: Database administrator at [employer]  Tobacco Use  . Smoking status: Former Smoker    Packs/day: 1.00    Years: 20.00    Pack years: 20.00    Types: Cigarettes    Last attempt to quit: 09/25/1984    Years since quitting: 32.8  . Smokeless tobacco: Never Used  Substance and Sexual Activity  . Alcohol use: No  Alcohol/week: 0.0 oz  . Drug use: No  . Sexual activity: Not Currently  Other Topics Concern  . Not on file  Social History Narrative  . Not on file    FAMILY HISTORY:  Family History  Problem Relation Age of Onset  . Emphysema Father        smoker  . Heart disease Father   . Breast cancer Mother 68  . Breast cancer Sister 87  . Heart Problems Sister   . Heart disease Sister   . Breast cancer Maternal Aunt 48  . Lung cancer Maternal Uncle 33       smoker  . Colon cancer Paternal Uncle 108  . Diabetes Maternal Grandmother   . Heart Problems Maternal Grandfather   . Stroke Paternal Grandfather   . Lung cancer Maternal Uncle 72       smoker  . Heart Problems Paternal Uncle   . Diabetes Paternal Grandmother   . Rectal cancer Neg Hx   . Stomach cancer Neg Hx   . Esophageal cancer Neg Hx     CURRENT MEDICATIONS:  Outpatient Encounter Medications as of 08/07/2017  Medication Sig Note  . acetaminophen (TYLENOL) 500 MG tablet Take 1,000-1,500 mg by mouth 2 (two) times daily as needed for headache (migraines). Reported on 09/08/2015   . Albuterol Sulfate (PROAIR RESPICLICK) 505 (90 Base) MCG/ACT AEPB Inhale 2 puffs into the lungs every 4 (four) hours as needed.   . ALPRAZolam (XANAX) 0.5 MG tablet Take 0.5 mg by mouth 2 (two) times daily as needed for anxiety.  02/19/2015: Received  from: External Pharmacy  . anastrozole (ARIMIDEX) 1 MG tablet Take 1 tablet (1 mg total) by mouth daily.   . APPLE CIDER VINEGAR PO Take by mouth.   Marland Kitchen atorvastatin (LIPITOR) 10 MG tablet Take 10 mg by mouth daily. 06/03/2015: Received from: External Pharmacy  . bisoprolol (ZEBETA) 10 MG tablet Take 1 tablet (10 mg total) by mouth daily.   . calcium-vitamin D (OSCAL WITH D) 500-200 MG-UNIT per tablet Take 1 tablet by mouth daily with breakfast.   . CARTIA XT 180 MG 24 hr capsule take 1 capsule by mouth once daily   . celecoxib (CELEBREX) 200 MG capsule Take 1 capsule by mouth every 3-4 days 04/26/2015: Received from: External Pharmacy Received Sig:   . cephALEXin (KEFLEX) 500 MG capsule Take 1 capsule (500 mg total) by mouth 4 (four) times daily.   . cyclobenzaprine (FLEXERIL) 10 MG tablet Take 1 tablet by mouth 3 (three) times daily. As needed 02/01/2016: Received from: External Pharmacy Received Sig: take 1 tablet by mouth three times a day if needed for SPASMS  . flecainide (TAMBOCOR) 50 MG tablet take 1 tablet by mouth twice a day   . fluticasone (FLONASE) 50 MCG/ACT nasal spray Place 2 sprays into both nostrils daily as needed for allergies.    . furosemide (LASIX) 40 MG tablet Take 40 mg by mouth 3 (three) times a week.  04/26/2015: Received from: External Pharmacy  . GARCINIA CAMBOGIA-CHROMIUM PO Take by mouth.   . hydrOXYzine (ATARAX/VISTARIL) 25 MG tablet Take 1 tablet by mouth every 6 (six) hours as needed. Itching 04/26/2015: Received from: External Pharmacy Received Sig: take 1 tablet by mouth every 6 hours if needed for itching  . losartan (COZAAR) 100 MG tablet Take 100 mg by mouth daily.  02/19/2015: Received from: External Pharmacy  . metFORMIN (GLUCOPHAGE) 500 MG tablet 1 tablet daily.   Marland Kitchen nystatin [facility]) powder Apply topically 2 (  two) times daily as needed.   Marland Kitchen omeprazole (PRILOSEC) 40 MG capsule take 1 capsule by mouth once daily BEFORE BREAKFAST   . potassium chloride (K-DUR) 10 MEQ  tablet Take 1 tablet by mouth daily. 04/26/2015: Received from: External Pharmacy Received Sig:   . Respiratory Therapy Supplies (FLUTTER) DEVI USE AS DIRECTED   . SYMBICORT 80-4.5 MCG/ACT inhaler inhale 2 puffs by mouth twice a day Rinse mouth after use   . temazepam (RESTORIL) 30 MG capsule Take 1 capsule (30 mg total) by mouth at bedtime as needed for sleep.   . Vitamin D, Ergocalciferol, (DRISDOL) 50000 units CAPS capsule take 1 capsule by mouth every week FOR 16 WEEKS   . XARELTO 20 MG TABS tablet take 1 tablet by mouth once daily WITH SUPPER    Facility-Administered Encounter Medications as of 08/07/2017  Medication  . 0.9 %  sodium chloride infusion    ALLERGIES:  Allergies  Allergen Reactions  . Shellfish Allergy Hives and Shortness Of Breath  . Ace Inhibitors Cough  . Epinephrine Other (See Comments)    Pallpitations during dental procedures  . Theophyllines Nausea And Vomiting and Other (See Comments)    Heart races and pounds  . Propoxyphene Hcl Nausea And Vomiting     PHYSICAL EXAM:  ECOG Performance status: 1 - Symptomatic, but independent   There were no vitals filed for this visit. There were no vitals filed for this visit.  Physical Exam  Constitutional: She is oriented to person, place, and time and well-developed, well-nourished, and in no distress.  HENT:  Head: Normocephalic.  Mouth/Throat: Oropharynx is clear and moist. No oropharyngeal exudate.  Eyes: Conjunctivae are normal. Pupils are equal, round, and reactive to light. No scleral icterus.  Neck: Normal range of motion. Neck supple.  Cardiovascular: Normal rate, regular rhythm and normal heart sounds.  Pulmonary/Chest: Effort normal. No respiratory distress. She has wheezes (Expiratory wheezes).    Abdominal: Soft. Bowel sounds are normal. There is no tenderness. There is no rebound and no guarding.  Musculoskeletal: She exhibits edema (Trace ankle edema ).  Requires assistance to get on exam table.    Lymphadenopathy:    She has no cervical adenopathy.       Right: No supraclavicular adenopathy present.       Left: No supraclavicular adenopathy present.  Neurological: She is alert and oriented to person, place, and time. No cranial nerve deficit. Gait normal.  Skin: Skin is warm and dry. Rash (intramammary fold ) noted.  Psychiatric: Mood, memory, affect and judgment normal.  Nursing note and vitals reviewed.    LABORATORY DATA:  I have reviewed the labs as listed.  CBC    Component Value Date/Time   WBC 6.7 03/30/2017 0921   RBC 3.43 (L) 03/30/2017 0921   HGB 9.9 (L) 03/30/2017 0921   HCT 31.1 (L) 03/30/2017 0921   PLT 312 03/30/2017 0921   MCV 90.7 03/30/2017 0921   MCH 28.9 03/30/2017 0921   MCHC 31.8 03/30/2017 0921   RDW 16.7 (H) 03/30/2017 0921   LYMPHSABS 1.2 03/30/2017 0921   MONOABS 0.5 03/30/2017 0921   EOSABS 0.2 03/30/2017 0921   BASOSABS 0.0 03/30/2017 0921   CMP Latest Ref Rng & Units 01/18/2017 07/20/2016 01/20/2016  Glucose 65 - 99 mg/dL 169(H) 160(H) 132(H)  BUN 6 - 20 mg/dL 20 18 26(H)  Creatinine 0.44 - 1.00 mg/dL 1.30(H) 1.28(H) 1.37(H)  Sodium 135 - 145 mmol/L 137 135 138  Potassium 3.5 - 5.1 mmol/L 3.6  3.5 4.3  Chloride 101 - 111 mmol/L 99(L) 98(L) 100(L)  CO2 22 - 32 mmol/L _0 Calcium 8.9 - 10.3 mg/dL 9.2 9.6 9.3  Total Protein 6.5 - 8.1 g/dL 7.7 8.1 7.8  Total Bilirubin 0.3 - 1.2 mg/dL 0.5 0.3 0.5  Alkaline Phos 38 - 126 U/L 64 64 64  AST 15 - 41 U/L _1 ALT 14 - 54 U/L _2 PENDING LABS:    DIAGNOSTIC IMAGING:  Most recent mammogram: 12/28/16 [facility])    PATHOLOGY:  (L) lumpectomy surgical path: 01/26/15       ASSESSMENT & PLAN:   Stage IA left breast invasive ductal carcinoma; ER+/PR+/HER2-: -Diagnosed in 11/2014; treated with (L) breast lumpectomy with Dr. Dalbert Batman, followed by adjuvant radiation therapy with Dr. Pablo Ledger. Completed radiation in 04/2015. Started anti-estrogen therapy with Anastrozole in  05/2015. -Clinical breast exam benign today.  -Most recent mammogram completed at [facility] on 12/28/16; report reviewed and no evidence of malignancy.  -Continue anastrozole.  Plan to continue anti-estrogen therapy for 5-10 years. Can consider ordering Breast Cancer Index (BCI) testing to see if she would benefit from extension of therapy beyond 5 years. *** -Return to cancer center in 6 months for continued surveillance.   ***  Bone health:  -Last DEXA scan on 04/30/15 showed osteopenia (T-score -1.1).  -Continue calcium/vitamin D supplementation and weight-bearing exercises, as tolerated.  -Labs reviewed and calcium adequate for Prolia injection today.  -Continue Prolia injections every 6 months as scheduled.  -Will be due for biennial DEXA imaging in 04/2017; orders placed today.   Fungal skin rash:  -Nystatin refilled to be used under breasts, as needed. Encouraged her to try to keep skin clean and dry to prevent fungal skin infections.   Insomnia:  -Discussed a trial of Restoril to see if this helps with her sleep disturbance. She took Ambien in the past, but it was reportedly not effective and she didn't like the way it made her feel.  Discussed possible side effects of Restoril. She agreed to give Restoril a try. She has Xanax prn for anxiety. Instructed her not to take the Restoril and Xanax at the same time.   -Paper prescription for Restoril 30 mg QHSprn, #30, no refills given to patient. If it is effective, I am happy to refill in the future.   Fatigue:  -Iron studies pending for today. Certainly iron deficiency anemia can contribute to fatigue. Attempted to add-on vitamin D, folate, and EPO level to labs already collected today, but there was not enough sample. Offered to send patient back to lab today for recollection vs adding on labs for next visit and she prefers to wait until next visit, which is reasonable.   -I am hopeful that if she is able to sleep better with the  addition of Restoril, then she may have some improvement in her fatigue symptoms.  -Will add-on full anemia panel, vitamin D, EPO (given CKD and could be contributing to anemia/fatigue) for next visit.      Dispo:  -Biennial DEXA scan due 04/2017; orders placed today.  -Return to cancer center in 6 months for continued surveillance with labs (CBC with diff, CMET, anemia panel, vitamin D, and EPO); orders placed today.  -Next mammogram due in 12/2017 at [facility]; orders placed today.     All questions were answered to patient's stated satisfaction. Encouraged patient to call with any new concerns or questions before her next visit  to the cancer center and we can certain see her sooner, if needed.    Plan of care discussed with Dr. Talbert Cage, who agrees with the above aforementioned.    Orders placed this encounter:  No orders of the defined types were placed in this encounter.     Mike Craze, NP [place] [phone]

## 2017-08-07 ENCOUNTER — Other Ambulatory Visit (HOSPITAL_COMMUNITY): Payer: Medicare Other

## 2017-08-07 ENCOUNTER — Other Ambulatory Visit (HOSPITAL_COMMUNITY): Payer: Self-pay | Admitting: *Deleted

## 2017-08-07 ENCOUNTER — Ambulatory Visit (HOSPITAL_COMMUNITY): Payer: Medicare Other | Admitting: Adult Health

## 2017-08-07 ENCOUNTER — Ambulatory Visit (HOSPITAL_COMMUNITY): Payer: Medicare Other

## 2017-08-15 ENCOUNTER — Ambulatory Visit (HOSPITAL_COMMUNITY): Payer: Medicare Other

## 2017-08-15 ENCOUNTER — Other Ambulatory Visit (HOSPITAL_COMMUNITY): Payer: Medicare Other

## 2017-08-22 ENCOUNTER — Ambulatory Visit (HOSPITAL_COMMUNITY): Payer: Medicare Other

## 2017-08-22 ENCOUNTER — Other Ambulatory Visit (HOSPITAL_COMMUNITY): Payer: Medicare Other

## 2017-09-03 ENCOUNTER — Other Ambulatory Visit (HOSPITAL_COMMUNITY): Payer: Medicare Other

## 2017-09-03 ENCOUNTER — Ambulatory Visit (HOSPITAL_COMMUNITY): Payer: Medicare Other

## 2017-09-03 ENCOUNTER — Ambulatory Visit (HOSPITAL_COMMUNITY): Payer: Medicare Other | Admitting: Oncology

## 2017-09-06 ENCOUNTER — Ambulatory Visit (HOSPITAL_COMMUNITY): Payer: Medicare Other

## 2017-09-06 ENCOUNTER — Other Ambulatory Visit (HOSPITAL_COMMUNITY): Payer: Medicare Other

## 2017-09-06 ENCOUNTER — Ambulatory Visit (HOSPITAL_COMMUNITY): Payer: Medicare Other | Admitting: Oncology

## 2017-09-11 NOTE — Progress Notes (Deleted)
[name], [place] [address]   CLINIC:  Medical Oncology/Hematology  PCP:  [name], MD [place] [address] [phone]   REASON FOR VISIT:  Follow-up for Stage IA left breast invasive ductal carcinoma; ER+/PR+/HER2- AND Osteopenia   CURRENT THERAPY: Anastrozole daily AND Prolia inj every 6 months  BRIEF ONCOLOGIC HISTORY:    Breast cancer of upper-inner quadrant of left female breast ([place])   12/15/2014 Mammogram    new 30m round nodule in left breast is indeterminate      12/17/2014 Imaging    6 mm lobulated nodule in the left breast is at low suspicion for malignancy. ultrasound guided biopsy recommended      12/23/2014 Procedure    ([facility]: Invasive ductal carcinoma.  ER+ (100%), PR+ (99%), HER2 neg (ratio 1.61). Ki67 45%.       01/26/2015 Surgery    (L) lumpectomy with SLNB (Dalbert Batman: IDC, grade 2, spanning 1.1 cm; also low-grade DCIS; margins negative. 0/3 left axillary SLN. HER2 repeated and remains neg. (ratio 1.33).  pT1c, pN0: Stage IA      03/19/2015 Genetic Testing    Genetic testing: Negative. Genes analyzed: ATM, BARD1, BRCA1, BRCA2, BRIP1, CDH1, CHEK2, FANCC, MLH1, MSH2, MSH6, NBN, PALB2, PMS2, PTEN, RAD51C, RAD51D, STK11, TP53, and XRCC2.  Additionally, this panel includes deletion/duplication analysis (without next-generation sequencing) of one gene, EPCAM.      03/2015 - 04/2015 Radiation Therapy    Adjuvant breast radiation with Dr. WPablo Ledger       04/30/2015 Imaging    DEXA scan: Osteopenia. (T-Score of -1.1)      05/2015 -  Anti-estrogen oral therapy    Anastrozole. Planned duration of therapy 5-10 years.       07/16/2015 Miscellaneous    Prolia injections started.         HISTORY OF PRESENT ILLNESS:  (From Dr. PDonald Porelast note on 07/20/16)     INTERVAL HISTORY:  Ms. [id number].o. female returns for routine follow-up for history of left breast  cancer.   ***   Her biggest complaint today is fatigue. Reports "having no energy whatsoever."  Her appetite is good, about 75% of her baseline. Denies any fever/chills/night sweats.  She doesn't sleep well; "I can't remember the last time I slept well."  She has seasonal allergies that affect her asthma; her shortness of breath with exertion is a little worse right now as a result.  States that she "itches all the time" d/t dry skin and allergies.    Continues to tolerate the Anastrozole relatively well; endorses continued arthralgias, but she does not feel they are any worse. She does ambulate with a cane.  Denies vaginal dryness or hot flashes.  Denies any new breast complaints or concerns. She is requesting a refill of the Nystatin powder that she applies under her breasts when she breaks out in a rash.   She is due for Prolia injection today. Denies any recent invasive dental procedures/extractions.  Her mammogram is up-to-date; [facility] [place]    Otherwise, she feels well.  She is looking forward to getting back [place]      REVIEW OF SYSTEMS:  Review of Systems  Constitutional: Positive for fatigue. Negative for chills and fever.  HENT:  Negative.  Negative for lump/mass and nosebleeds.   Eyes: Negative.  Respiratory: Positive for shortness of breath. Negative for cough.   Cardiovascular: Negative.  Negative for chest pain and leg swelling.  Gastrointestinal: Negative.  Negative for abdominal pain, blood in stool, constipation, diarrhea, nausea and vomiting.  Endocrine: Negative.   Genitourinary: Negative.  Negative for dysuria and hematuria.   Musculoskeletal: Positive for arthralgias.  Skin: Positive for rash (under bilat breasts (chronic)).  Neurological: Negative.  Negative for dizziness and headaches.  Hematological: Negative.  Negative for adenopathy. Does not bruise/bleed  easily.  Psychiatric/Behavioral: Positive for sleep disturbance. Negative for depression. The patient is not nervous/anxious.      PAST MEDICAL/SURGICAL HISTORY:  Past Medical History:  Diagnosis Date  . Acute renal failure ([place]) 01/27/2015  . Acute respiratory failure ([place]) 01/27/2015  . Adenomatous colon polyp 1993  . Allergy   . Anemia   . Anxiety   . Anxiety disorder   . Asthma    Spirometry 1989: FEV1 2.12 (71%) with ratio 82.  HFA 90% after coaching 11-09-10  . Atrial flutter ([place]) chadsvasc of 3 (11/19/14)   Status post cardioversion 2-12 2013. s/p RFCA 5/13 with JAllred (coumadin and amio stopped)   . Breast cancer ([place])    left  . COPD (chronic obstructive pulmonary disease) ([place])   . DDD (degenerative disc disease)   . Diverticulosis   . Essential hypertension, benign   . Fibromyalgia   . GERD (gastroesophageal reflux disease)    EGD with HH / esophagitis 06-30-93  . Hiatal hernia   . History of atrial fibrillation   . Hyperlipidemia   . Internal and external hemorrhoids without complication   . Lipoma of colon   . Migraine   . Osteoarthritis   . Pelvic fracture ([place])   . Personal history of colonic polyps 06/14/2010  . Pneumonia   . PONV (postoperative nausea and vomiting)   . Radiation    left breast cancer  . Sleep apnea    cpap   Past Surgical History:  Procedure Laterality Date  . A FLUTTER ABLATION  02/22/2012  . ATRIAL FLUTTER ABLATION N/A 02/22/2012   Procedure: ATRIAL FLUTTER ABLATION;  Surgeon: Thompson Grayer, MD;  Location: [facility] CATH LAB;  Service: Cardiovascular;  Laterality: N/A;  . BREAST BIOPSY Left 11/2014  . CARDIOVERSION N/A 02/19/2014   Procedure: CARDIOVERSION;  Surgeon: Dorothy Spark, MD;  Location: [facility];  Service: Cardiovascular;  Laterality: N/A;  . COLONOSCOPY  05/2010, 07/2000  . DILATION AND CURETTAGE OF UTERUS    . ESOPHAGOGASTRODUODENOSCOPY  05/2010, 06/1993  . JOINT REPLACEMENT    . POLYPECTOMY    . RADIOACTIVE SEED GUIDED PARTIAL  MASTECTOMY WITH AXILLARY SENTINEL LYMPH NODE BIOPSY Left 01/26/2015   Procedure: LEFT  PARTIAL MASTECTOMY WITH  RADIOACTIVE SEED LOCALIZATION, LEFT AXILLARY SENTINEL LYMPH NODE BIOPSY;  Surgeon: Fanny Skates, MD;  Location: [facility];  Service: General;  Laterality: Left;  . ROTATOR CUFF REPAIR     bilateral  . ROTATOR CUFF REPAIR     bil  . TOTAL KNEE ARTHROPLASTY     x2 09/2006  . UPPER GASTROINTESTINAL ENDOSCOPY       SOCIAL HISTORY:  Social History   Socioeconomic History  . Marital status: Widowed    Spouse name: Not on file  . Number of children: 0  . Years of education: Not on file  . Highest education level: Not on file  Social Needs  . Financial resource strain: Not on file  . Food insecurity - worry: Not on file  . Food  insecurity - inability: Not on file  . Transportation needs - medical: Not on file  . Transportation needs - non-medical: Not on file  Occupational History  . Occupation: Database administrator at [employer]  Tobacco Use  . Smoking status: Former Smoker    Packs/day: 1.00    Years: 20.00    Pack years: 20.00    Types: Cigarettes    Last attempt to quit: 09/25/1984    Years since quitting: 32.9  . Smokeless tobacco: Never Used  Substance and Sexual Activity  . Alcohol use: No    Alcohol/week: 0.0 oz  . Drug use: No  . Sexual activity: Not Currently  Other Topics Concern  . Not on file  Social History Narrative  . Not on file    FAMILY HISTORY:  Family History  Problem Relation Age of Onset  . Emphysema Father        smoker  . Heart disease Father   . Breast cancer Mother 33  . Breast cancer Sister 58  . Heart Problems Sister   . Heart disease Sister   . Breast cancer Maternal Aunt 48  . Lung cancer Maternal Uncle 70       smoker  . Colon cancer Paternal Uncle 63  . Diabetes Maternal Grandmother   . Heart Problems Maternal Grandfather   . Stroke Paternal Grandfather   . Lung cancer Maternal Uncle 72       smoker  . Heart  Problems Paternal Uncle   . Diabetes Paternal Grandmother   . Rectal cancer Neg Hx   . Stomach cancer Neg Hx   . Esophageal cancer Neg Hx     CURRENT MEDICATIONS:  Outpatient Encounter Medications as of 09/12/2017  Medication Sig Note  . acetaminophen (TYLENOL) 500 MG tablet Take 1,000-1,500 mg by mouth 2 (two) times daily as needed for headache (migraines). Reported on 09/08/2015   . Albuterol Sulfate (PROAIR RESPICLICK) 992 (90 Base) MCG/ACT AEPB Inhale 2 puffs into the lungs every 4 (four) hours as needed.   . ALPRAZolam (XANAX) 0.5 MG tablet Take 0.5 mg by mouth 2 (two) times daily as needed for anxiety.  02/19/2015: Received from: External Pharmacy  . anastrozole (ARIMIDEX) 1 MG tablet Take 1 tablet (1 mg total) by mouth daily.   . APPLE CIDER VINEGAR PO Take by mouth.   Marland Kitchen atorvastatin (LIPITOR) 10 MG tablet Take 10 mg by mouth daily. 06/03/2015: Received from: External Pharmacy  . bisoprolol (ZEBETA) 10 MG tablet Take 1 tablet (10 mg total) by mouth daily.   . calcium-vitamin D (OSCAL WITH D) 500-200 MG-UNIT per tablet Take 1 tablet by mouth daily with breakfast.   . CARTIA XT 180 MG 24 hr capsule take 1 capsule by mouth once daily   . celecoxib (CELEBREX) 200 MG capsule Take 1 capsule by mouth every 3-4 days 04/26/2015: Received from: External Pharmacy Received Sig:   . cephALEXin (KEFLEX) 500 MG capsule Take 1 capsule (500 mg total) by mouth 4 (four) times daily.   . cyclobenzaprine (FLEXERIL) 10 MG tablet Take 1 tablet by mouth 3 (three) times daily. As needed 02/01/2016: Received from: External Pharmacy Received Sig: take 1 tablet by mouth three times a day if needed for SPASMS  . flecainide (TAMBOCOR) 50 MG tablet take 1 tablet by mouth twice a day   . fluticasone (FLONASE) 50 MCG/ACT nasal spray Place 2 sprays into both nostrils daily as needed for allergies.    . furosemide (LASIX) 40 MG tablet  Take 40 mg by mouth 3 (three) times a week.  04/26/2015: Received from: External Pharmacy  .  GARCINIA CAMBOGIA-CHROMIUM PO Take by mouth.   . hydrOXYzine (ATARAX/VISTARIL) 25 MG tablet Take 1 tablet by mouth every 6 (six) hours as needed. Itching 04/26/2015: Received from: External Pharmacy Received Sig: take 1 tablet by mouth every 6 hours if needed for itching  . losartan (COZAAR) 100 MG tablet Take 100 mg by mouth daily.  02/19/2015: Received from: External Pharmacy  . metFORMIN (GLUCOPHAGE) 500 MG tablet 1 tablet daily.   Marland Kitchen nystatin [facility]) powder Apply topically 2 (two) times daily as needed.   Marland Kitchen omeprazole (PRILOSEC) 40 MG capsule take 1 capsule by mouth once daily BEFORE BREAKFAST   . potassium chloride (K-DUR) 10 MEQ tablet Take 1 tablet by mouth daily. 04/26/2015: Received from: External Pharmacy Received Sig:   . Respiratory Therapy Supplies (FLUTTER) DEVI USE AS DIRECTED   . SYMBICORT 80-4.5 MCG/ACT inhaler inhale 2 puffs by mouth twice a day Rinse mouth after use   . temazepam (RESTORIL) 30 MG capsule Take 1 capsule (30 mg total) by mouth at bedtime as needed for sleep.   . Vitamin D, Ergocalciferol, (DRISDOL) 50000 units CAPS capsule take 1 capsule by mouth every week FOR 16 WEEKS   . XARELTO 20 MG TABS tablet take 1 tablet by mouth once daily WITH SUPPER    Facility-Administered Encounter Medications as of 09/12/2017  Medication  . 0.9 %  sodium chloride infusion    ALLERGIES:  Allergies  Allergen Reactions  . Shellfish Allergy Hives and Shortness Of Breath  . Ace Inhibitors Cough  . Epinephrine Other (See Comments)    Pallpitations during dental procedures  . Theophyllines Nausea And Vomiting and Other (See Comments)    Heart races and pounds  . Propoxyphene Hcl Nausea And Vomiting     PHYSICAL EXAM:  ECOG Performance status: 1 - Symptomatic, but independent   There were no vitals filed for this visit. There were no vitals filed for this visit.  Physical Exam  Constitutional: She is oriented to person, place, and time and well-developed, well-nourished, and  in no distress.  HENT:  Head: Normocephalic.  Mouth/Throat: Oropharynx is clear and moist. No oropharyngeal exudate.  Eyes: Conjunctivae are normal. Pupils are equal, round, and reactive to light. No scleral icterus.  Neck: Normal range of motion. Neck supple.  Cardiovascular: Normal rate, regular rhythm and normal heart sounds.  Pulmonary/Chest: Effort normal. No respiratory distress. She has wheezes (Expiratory wheezes).    Abdominal: Soft. Bowel sounds are normal. There is no tenderness. There is no rebound and no guarding.  Musculoskeletal: She exhibits edema (Trace ankle edema ).  Requires assistance to get on exam table.   Lymphadenopathy:    She has no cervical adenopathy.       Right: No supraclavicular adenopathy present.       Left: No supraclavicular adenopathy present.  Neurological: She is alert and oriented to person, place, and time. No cranial nerve deficit. Gait normal.  Skin: Skin is warm and dry. Rash (intramammary fold ) noted.  Psychiatric: Mood, memory, affect and judgment normal.  Nursing note and vitals reviewed.    LABORATORY DATA:  I have reviewed the labs as listed.  CBC    Component Value Date/Time   WBC 6.7 03/30/2017 0921   RBC 3.43 (L) 03/30/2017 0921   HGB 9.9 (L) 03/30/2017 0921   HCT 31.1 (L) 03/30/2017 0921   PLT 312 03/30/2017 1601  MCV 90.7 03/30/2017 0921   MCH 28.9 03/30/2017 0921   MCHC 31.8 03/30/2017 0921   RDW 16.7 (H) 03/30/2017 0921   LYMPHSABS 1.2 03/30/2017 0921   MONOABS 0.5 03/30/2017 0921   EOSABS 0.2 03/30/2017 0921   BASOSABS 0.0 03/30/2017 0921   CMP Latest Ref Rng & Units 01/18/2017 07/20/2016 01/20/2016  Glucose 65 - 99 mg/dL 169(H) 160(H) 132(H)  BUN 6 - 20 mg/dL 20 18 26(H)  Creatinine 0.44 - 1.00 mg/dL 1.30(H) 1.28(H) 1.37(H)  Sodium 135 - 145 mmol/L 137 135 138  Potassium 3.5 - 5.1 mmol/L 3.6 3.5 4.3  Chloride 101 - 111 mmol/L 99(L) 98(L) 100(L)  CO2 22 - 32 mmol/L _0 Calcium 8.9 - 10.3 mg/dL 9.2 9.6  9.3  Total Protein 6.5 - 8.1 g/dL 7.7 8.1 7.8  Total Bilirubin 0.3 - 1.2 mg/dL 0.5 0.3 0.5  Alkaline Phos 38 - 126 U/L 64 64 64  AST 15 - 41 U/L _1 ALT 14 - 54 U/L _2 PENDING LABS:    DIAGNOSTIC IMAGING:  Most recent mammogram: 12/28/16 [facility])    PATHOLOGY:  (L) lumpectomy surgical path: 01/26/15           ASSESSMENT & PLAN:   Stage IA left breast invasive ductal carcinoma; ER+/PR+/HER2-: -Diagnosed in 11/2014; treated with (L) breast lumpectomy with Dr. Dalbert Batman, followed by adjuvant radiation therapy with Dr. Pablo Ledger. Completed radiation in 04/2015. Started anti-estrogen therapy with Anastrozole in 05/2015. Plan to continue anti-estrogen therapy for 5-10 years.  -Clinical breast exam benign today.  -Most recent mammogram completed at [facility] on 12/28/16; report reviewed and no evidence of malignancy.  -Continue anastrozole.  -Return to cancer center in 6 months for continued surveillance.   Bone health:  -Last DEXA scan on 04/30/15 showed osteopenia (T-score -1.1).  -Continue calcium/vitamin D supplementation and weight-bearing exercises, as tolerated.  -Labs reviewed and calcium adequate for Prolia injection today.  -Continue Prolia injections every 6 months as scheduled.  -Will be due for biennial DEXA imaging in 04/2017; orders placed today.   Fungal skin rash:  -Nystatin refilled to be used under breasts, as needed. Encouraged her to try to keep skin clean and dry to prevent fungal skin infections.   Insomnia:  -Discussed a trial of Restoril to see if this helps with her sleep disturbance. She took Ambien in the past, but it was reportedly not effective and she didn't like the way it made her feel.  Discussed possible side effects of Restoril. She agreed to give Restoril a try. She has Xanax prn for anxiety. Instructed her not to take the Restoril and Xanax at the same time.   -Paper prescription for Restoril 30 mg QHSprn, #30, no refills given to  patient. If it is effective, I am happy to refill in the future.   Fatigue:  -Iron studies pending for today. Certainly iron deficiency anemia can contribute to fatigue. Attempted to add-on vitamin D, folate, and EPO level to labs already collected today, but there was not enough sample. Offered to send patient back to lab today for recollection vs adding on labs for next visit and she prefers to wait until next visit, which is reasonable.   -I am hopeful that if she is able to sleep better with the addition of Restoril, then she may have some improvement in her fatigue symptoms.  -Will add-on full anemia panel, vitamin D, EPO (given CKD and could be contributing to anemia/fatigue)  for next visit.      Dispo:  -Biennial DEXA scan due 04/2017; orders placed today.  -Return to cancer center in 6 months for continued surveillance with labs (CBC with diff, CMET, anemia panel, vitamin D, and EPO); orders placed today.  -Next mammogram due in 12/2017 at [facility]; orders placed today.     All questions were answered to patient's stated satisfaction. Encouraged patient to call with any new concerns or questions before her next visit to the cancer center and we can certain see her sooner, if needed.    Plan of care discussed with Dr. Talbert Cage, who agrees with the above aforementioned.    Orders placed this encounter:  No orders of the defined types were placed in this encounter.     Mike Craze, NP [place] [phone]

## 2017-09-12 ENCOUNTER — Other Ambulatory Visit (HOSPITAL_COMMUNITY): Payer: Medicare Other

## 2017-09-12 ENCOUNTER — Ambulatory Visit (HOSPITAL_COMMUNITY): Payer: Medicare Other | Admitting: Adult Health

## 2017-09-12 ENCOUNTER — Ambulatory Visit (HOSPITAL_COMMUNITY): Payer: Medicare Other

## 2017-09-20 ENCOUNTER — Other Ambulatory Visit: Payer: Self-pay

## 2017-09-20 MED ORDER — ALBUTEROL SULFATE 108 (90 BASE) MCG/ACT IN AEPB: 2.0000 | INHALATION_SPRAY | RESPIRATORY_TRACT | 4 refills | Status: DC | PRN

## 2017-09-28 ENCOUNTER — Other Ambulatory Visit (HOSPITAL_COMMUNITY): Payer: Self-pay | Admitting: *Deleted

## 2017-09-28 MED ORDER — VITAMIN D (ERGOCALCIFEROL) 1.25 MG (50000 UNIT) PO CAPS: ORAL_CAPSULE | ORAL | 1 refills | Status: DC

## 2017-10-01 ENCOUNTER — Ambulatory Visit (HOSPITAL_COMMUNITY): Payer: Medicare Other | Admitting: Nurse Practitioner

## 2017-10-04 ENCOUNTER — Encounter (HOSPITAL_COMMUNITY): Payer: Self-pay | Admitting: *Deleted

## 2017-10-04 ENCOUNTER — Ambulatory Visit (HOSPITAL_COMMUNITY): Payer: Medicare Other | Admitting: Nurse Practitioner

## 2017-10-04 NOTE — Progress Notes (Signed)
No show letter sent.

## 2017-10-05 ENCOUNTER — Other Ambulatory Visit (HOSPITAL_COMMUNITY): Payer: Medicare Other

## 2017-10-05 ENCOUNTER — Ambulatory Visit (HOSPITAL_COMMUNITY): Payer: Medicare Other

## 2017-10-05 ENCOUNTER — Ambulatory Visit (HOSPITAL_COMMUNITY): Payer: Medicare Other | Admitting: Adult Health

## 2017-10-10 ENCOUNTER — Inpatient Hospital Stay (HOSPITAL_COMMUNITY): Payer: Medicare Other

## 2017-10-10 ENCOUNTER — Inpatient Hospital Stay (HOSPITAL_BASED_OUTPATIENT_CLINIC_OR_DEPARTMENT_OTHER): Payer: Medicare Other | Admitting: Internal Medicine

## 2017-10-10 ENCOUNTER — Inpatient Hospital Stay (HOSPITAL_COMMUNITY): Payer: Medicare Other | Attending: Adult Health

## 2017-10-10 ENCOUNTER — Other Ambulatory Visit: Payer: Self-pay

## 2017-10-10 ENCOUNTER — Encounter (HOSPITAL_COMMUNITY): Payer: Self-pay | Admitting: Internal Medicine

## 2017-10-10 VITALS — BP 157/62 | HR 74 | Temp 98.1°F | Resp 22 | Wt 273.0 lb

## 2017-10-10 DIAGNOSIS — N183 Chronic kidney disease, stage 3 unspecified: Secondary | ICD-10-CM

## 2017-10-10 DIAGNOSIS — Z7901 Long term (current) use of anticoagulants: Secondary | ICD-10-CM | POA: Insufficient documentation

## 2017-10-10 DIAGNOSIS — N189 Chronic kidney disease, unspecified: Secondary | ICD-10-CM

## 2017-10-10 DIAGNOSIS — Z79811 Long term (current) use of aromatase inhibitors: Secondary | ICD-10-CM | POA: Diagnosis not present

## 2017-10-10 DIAGNOSIS — C50212 Malignant neoplasm of upper-inner quadrant of left female breast: Secondary | ICD-10-CM

## 2017-10-10 DIAGNOSIS — D631 Anemia in chronic kidney disease: Secondary | ICD-10-CM

## 2017-10-10 DIAGNOSIS — Z170 Estrogen receptor positive status [ER+]: Secondary | ICD-10-CM | POA: Insufficient documentation

## 2017-10-10 DIAGNOSIS — I4892 Unspecified atrial flutter: Secondary | ICD-10-CM | POA: Insufficient documentation

## 2017-10-10 DIAGNOSIS — Z79899 Other long term (current) drug therapy: Secondary | ICD-10-CM

## 2017-10-10 DIAGNOSIS — G4700 Insomnia, unspecified: Secondary | ICD-10-CM

## 2017-10-10 DIAGNOSIS — D509 Iron deficiency anemia, unspecified: Secondary | ICD-10-CM | POA: Insufficient documentation

## 2017-10-10 DIAGNOSIS — M8580 Other specified disorders of bone density and structure, unspecified site: Secondary | ICD-10-CM

## 2017-10-10 DIAGNOSIS — R5383 Other fatigue: Secondary | ICD-10-CM

## 2017-10-10 DIAGNOSIS — I4891 Unspecified atrial fibrillation: Secondary | ICD-10-CM | POA: Diagnosis not present

## 2017-10-10 LAB — CBC WITH DIFFERENTIAL/PLATELET
BASOS ABS: 0 10*3/uL (ref 0.0–0.1)
BASOS PCT: 1 %
EOS ABS: 0.2 10*3/uL (ref 0.0–0.7)
EOS PCT: 3 %
HCT: 32.6 % — ABNORMAL LOW (ref 36.0–46.0)
Hemoglobin: 9.8 g/dL — ABNORMAL LOW (ref 12.0–15.0)
Lymphocytes Relative: 12 %
Lymphs Abs: 0.9 10*3/uL (ref 0.7–4.0)
MCH: 26.6 pg (ref 26.0–34.0)
MCHC: 30.1 g/dL (ref 30.0–36.0)
MCV: 88.3 fL (ref 78.0–100.0)
Monocytes Absolute: 0.5 10*3/uL (ref 0.1–1.0)
Monocytes Relative: 7 %
NEUTROS PCT: 77 %
Neutro Abs: 5.9 10*3/uL (ref 1.7–7.7)
PLATELETS: 367 10*3/uL (ref 150–400)
RBC: 3.69 MIL/uL — AB (ref 3.87–5.11)
RDW: 16.1 % — ABNORMAL HIGH (ref 11.5–15.5)
WBC: 7.6 10*3/uL (ref 4.0–10.5)

## 2017-10-10 LAB — COMPREHENSIVE METABOLIC PANEL
ALBUMIN: 4 g/dL (ref 3.5–5.0)
ALT: 13 U/L — AB (ref 14–54)
AST: 17 U/L (ref 15–41)
Alkaline Phosphatase: 67 U/L (ref 38–126)
Anion gap: 14 (ref 5–15)
BUN: 19 mg/dL (ref 6–20)
CHLORIDE: 94 mmol/L — AB (ref 101–111)
CO2: 29 mmol/L (ref 22–32)
CREATININE: 1.16 mg/dL — AB (ref 0.44–1.00)
Calcium: 9.5 mg/dL (ref 8.9–10.3)
GFR calc non Af Amer: 47 mL/min — ABNORMAL LOW (ref >60)
GFR, EST AFRICAN AMERICAN: 54 mL/min — AB (ref >60)
GLUCOSE: 141 mg/dL — AB (ref 65–99)
Potassium: 3.7 mmol/L (ref 3.5–5.1)
SODIUM: 137 mmol/L (ref 135–145)
Total Bilirubin: 0.5 mg/dL (ref 0.3–1.2)
Total Protein: 8.4 g/dL — ABNORMAL HIGH (ref 6.5–8.1)

## 2017-10-10 LAB — VITAMIN B12: Vitamin B-12: 213 pg/mL (ref 180–914)

## 2017-10-10 LAB — FOLATE: FOLATE: 17.1 ng/mL (ref >5.9)

## 2017-10-10 LAB — IRON AND TIBC
Iron: 44 ug/dL (ref 28–170)
SATURATION RATIOS: 10 % — AB (ref 10.4–31.8)
TIBC: 435 ug/dL (ref 250–450)
UIBC: 391 ug/dL

## 2017-10-10 LAB — FERRITIN: FERRITIN: 38 ng/mL (ref 11–307)

## 2017-10-10 MED ORDER — DENOSUMAB 60 MG/ML ~~LOC~~ SOLN
60.0000 mg | Freq: Once | SUBCUTANEOUS | Status: AC
  Administered 2017-10-10: 60 mg via SUBCUTANEOUS
  Filled 2017-10-10: qty 1

## 2017-10-10 MED ORDER — TEMAZEPAM 30 MG PO CAPS: 30.0000 mg | ORAL_CAPSULE | Freq: Every evening | ORAL | 2 refills | Status: DC | PRN

## 2017-10-10 MED ORDER — NYSTATIN 100000 UNIT/GM EX POWD: Freq: Two times a day (BID) | CUTANEOUS | 1 refills | Status: DC | PRN

## 2017-10-10 NOTE — Progress Notes (Signed)
[facility] [place], [place] [address]   CLINIC:  Medical Oncology/Hematology  PCP:  Shelley Chroman, MD [place] [address] [phone]   REASON FOR VISIT:  Follow-up for Stage IA left breast invasive ductal carcinoma; ER+/PR+/HER2- AND Osteopenia  AND Iron deficiency anemia, CKD  CURRENT THERAPY: Anastrozole daily AND Prolia inj every 6 months  BRIEF ONCOLOGIC HISTORY:    Breast cancer of upper-inner quadrant of left female breast ([place])   12/15/2014 Mammogram    new 53m round nodule in left breast is indeterminate      12/17/2014 Imaging    6 mm lobulated nodule in the left breast is at low suspicion for malignancy. ultrasound guided biopsy recommended      12/23/2014 Procedure    (L[facility]: Invasive ductal carcinoma.  ER+ (100%), PR+ (99%), HER2 neg (ratio 1.61). Ki67 45%.       01/26/2015 Surgery    (L) lumpectomy with SLNB ([name]: IDC, grade 2, spanning 1.1 cm; also low-grade DCIS; margins negative. 0/3 left axillary SLN. HER2 repeated and remains neg. (ratio 1.33).  pT1c, pN0: Stage IA      03/19/2015 Genetic Testing    Genetic testing: Negative. Genes analyzed: ATM, BARD1, BRCA1, BRCA2, BRIP1, CDH1, CHEK2, FANCC, MLH1, MSH2, MSH6, NBN, PALB2, PMS2, PTEN, RAD51C, RAD51D, STK11, TP53, and XRCC2.  Additionally, this panel includes deletion/duplication analysis (without next-generation sequencing) of one gene, EPCAM.      03/2015 - 04/2015 Radiation Therapy    Adjuvant breast radiation with Dr. WPablo Murphy       04/30/2015 Imaging    DEXA scan: Osteopenia. (T-Score of -1.1)      05/2015 -  Anti-estrogen oral therapy    Anastrozole. Planned duration of therapy 5-10 years.       07/16/2015 Miscellaneous    Prolia injections started.         HISTORY OF PRESENT ILLNESS:   Left Breast cancer, pT1c, pN0, M0 ER + (100%) PR+ (99%) Ki-67 45% HER-2/NEU by CISH Negative. Note that the tumor appears to show  heterogeneity in regards to HER 2 expression. Oncotype Recurrence Score Result of 2, Low Risk Adjuvant XRT Negative Genetics Evaluation DEXA with osteopenia Iron deficiency Anemia, IV iron C-scope with Dr. GCarlean Purl2/2017     INTERVAL HISTORY:  [name]89y.o. female returns for 6- month follow-up for history of left breast cancer.    Continues to tolerate the Anastrozole relatively well; She denies nay new bone pains, she reports improved sleep with restoril and also improved day time fatigue.  She is due for Prolia injection today. Denies any recent invasive dental procedures/extractions.     REVIEW OF SYSTEMS:  Review of Systems  Constitutional: Negative for chills, fatigue and fever.  HENT:  Negative.  Negative for lump/mass and nosebleeds.   Eyes: Negative.   Respiratory: Negative for cough.   Cardiovascular: Negative.  Negative for chest pain and leg swelling.  Gastrointestinal: Negative.  Negative for abdominal pain, blood in stool, constipation, diarrhea, nausea and vomiting.  Endocrine: Negative.   Genitourinary: Negative.  Negative for dysuria and hematuria.   Musculoskeletal: Positive for arthralgias, back pain and neck pain.  Skin: Negative for rash.  Neurological: Negative.  Negative for dizziness and headaches.  Hematological: Negative.  Negative for adenopathy. Does not bruise/bleed easily.  Psychiatric/Behavioral: Negative for depression and sleep disturbance. The patient is not nervous/anxious.   All other systems reviewed and are negative.  PAST MEDICAL/SURGICAL HISTORY:  Past Medical History:  Diagnosis Date  . Acute renal failure ([place]) 01/27/2015  . Acute respiratory failure ([place]) 01/27/2015  . Adenomatous colon polyp 1993  . Allergy   . Anemia   . Anxiety   . Anxiety disorder   . Asthma    Spirometry 1989: FEV1 2.12 (71%) with ratio 82.  HFA 90% after coaching 11-09-10  . Atrial flutter ([place]) chadsvasc of 3 (11/19/14)   Status post  cardioversion 2-12 2013. s/p RFCA 5/13 with [name] (coumadin and amio stopped)   . Breast cancer ([place])    left  . COPD (chronic obstructive pulmonary disease) ([place])   . DDD (degenerative disc disease)   . Diverticulosis   . Essential hypertension, benign   . Fibromyalgia   . GERD (gastroesophageal reflux disease)    EGD with HH / esophagitis 06-30-93  . Hiatal hernia   . History of atrial fibrillation   . Hyperlipidemia   . Internal and external hemorrhoids without complication   . Lipoma of colon   . Migraine   . Osteoarthritis   . Pelvic fracture ([place])   . Personal history of colonic polyps 06/14/2010  . Pneumonia   . PONV (postoperative nausea and vomiting)   . Radiation    left breast cancer  . Sleep apnea    cpap   Past Surgical History:  Procedure Laterality Date  . A FLUTTER ABLATION  02/22/2012  . ATRIAL FLUTTER ABLATION N/A 02/22/2012   Procedure: ATRIAL FLUTTER ABLATION;  Surgeon: Shelley Grayer, MD;  Location[facility] C[facility] LAB;  Service: Cardiovascular;  Laterality: N/A;  . BREAST BIOPSY Left 11/2014  . CARDIOVERSION N/A 02/19/2014   Procedure: CARDIOVERSION;  Surgeon: Shelley Spark, MD;  Location: [facility];  Service: Cardiovascular;  Laterality: N/A;  . COLONOSCOPY  05/2010, 07/2000  . DILATION AND CURETTAGE OF UTERUS    . ESOPHAGOGASTRODUODENOSCOPY  05/2010, 06/1993  . JOINT REPLACEMENT    . POLYPECTOMY    . RADIOACTIVE SEED GUIDED PARTIAL MASTECTOMY WITH AXILLARY SENTINEL LYMPH NODE BIOPSY Left 01/26/2015   Procedure: LEFT  PARTIAL MASTECTOMY WITH  RADIOACTIVE SEED LOCALIZATION, LEFT AXILLARY SENTINEL LYMPH NODE BIOPSY;  Surgeon: Shelley Skates, MD;  Location: [facility];  Service: General;  Laterality: Left;  . ROTATOR CUFF REPAIR     bilateral  . ROTATOR CUFF REPAIR     bil  . TOTAL KNEE ARTHROPLASTY     x2 09/2006  . UPPER GASTROINTESTINAL ENDOSCOPY       SOCIAL HISTORY:  Social History   Socioeconomic History  . Marital status: Widowed    Spouse name: Not on file    . Number of children: 0  . Years of education: Not on file  . Highest education level: Not on file  Social Needs  . Financial resource strain: Not on file  . Food insecurity - worry: Not on file  . Food insecurity - inability: Not on file  . Transportation needs - medical: Not on file  . Transportation needs - non-medical: Not on file  Occupational History  . Occupation: Database administrator at [employer]  Tobacco Use  . Smoking status: Former Smoker    Packs/day: 1.00    Years: 20.00    Pack years: 20.00    Types: Cigarettes    Last attempt to quit: 09/25/1984    Years since quitting: 33.0  . Smokeless tobacco: Never Used  Substance and Sexual Activity  . Alcohol use: No    Alcohol/week: 0.0  oz  . Drug use: No  . Sexual activity: Not Currently  Other Topics Concern  . Not on file  Social History Narrative  . Not on file    FAMILY HISTORY:  Family History  Problem Relation Age of Onset  . Emphysema Father        smoker  . Heart disease Father   . Breast cancer Mother 43  . Breast cancer Sister 17  . Heart Problems Sister   . Heart disease Sister   . Breast cancer Maternal Aunt 48  . Lung cancer Maternal Uncle 68       smoker  . Colon cancer Paternal Uncle 66  . Diabetes Maternal Grandmother   . Heart Problems Maternal Grandfather   . Stroke Paternal Grandfather   . Lung cancer Maternal Uncle 72       smoker  . Heart Problems Paternal Uncle   . Diabetes Paternal Grandmother   . Rectal cancer Neg Hx   . Stomach cancer Neg Hx   . Esophageal cancer Neg Hx     CURRENT MEDICATIONS:  Outpatient Encounter Medications as of 10/10/2017  Medication Sig Note  . acetaminophen (TYLENOL) 500 MG tablet Take 1,000-1,500 mg by mouth 2 (two) times daily as needed for headache (migraines). Reported on 09/08/2015   . Albuterol Sulfate (PROAIR RESPICLICK) 935 (90 Base) MCG/ACT AEPB Inhale 2 puffs into the lungs every 4 (four) hours as needed.   . ALPRAZolam  (XANAX) 0.5 MG tablet Take 0.5 mg by mouth 2 (two) times daily as needed for anxiety.  02/19/2015: Received from: External Pharmacy  . anastrozole (ARIMIDEX) 1 MG tablet Take 1 tablet (1 mg total) by mouth daily.   Marland Kitchen atorvastatin (LIPITOR) 10 MG tablet Take 10 mg by mouth daily. 06/03/2015: Received from: External Pharmacy  . bisoprolol (ZEBETA) 10 MG tablet Take 1 tablet (10 mg total) by mouth daily.   . calcium-vitamin D (OSCAL WITH D) 500-200 MG-UNIT per tablet Take 1 tablet by mouth daily with breakfast.   . CARTIA XT 180 MG 24 hr capsule take 1 capsule by mouth once daily   . celecoxib (CELEBREX) 200 MG capsule Take 1 capsule by mouth every 3-4 days 04/26/2015: Received from: External Pharmacy Received Sig:   . cephALEXin (KEFLEX) 500 MG capsule Take 1 capsule (500 mg total) by mouth 4 (four) times daily.   . cyclobenzaprine (FLEXERIL) 10 MG tablet Take 1 tablet by mouth 3 (three) times daily. As needed 02/01/2016: Received from: External Pharmacy Received Sig: take 1 tablet by mouth three times a day if needed for SPASMS  . flecainide (TAMBOCOR) 50 MG tablet take 1 tablet by mouth twice a day   . fluticasone (FLONASE) 50 MCG/ACT nasal spray Place 2 sprays into both nostrils daily as needed for allergies.    . furosemide (LASIX) 40 MG tablet Take 40 mg by mouth 3 (three) times a week.  04/26/2015: Received from: External Pharmacy  . GARCINIA CAMBOGIA-CHROMIUM PO Take by mouth.   . hydrOXYzine (ATARAX/VISTARIL) 25 MG tablet Take 1 tablet by mouth every 6 (six) hours as needed. Itching 04/26/2015: Received from: External Pharmacy Received Sig: take 1 tablet by mouth every 6 hours if needed for itching  . losartan (COZAAR) 100 MG tablet Take 100 mg by mouth daily.  02/19/2015: Received from: External Pharmacy  . metFORMIN (GLUCOPHAGE) 500 MG tablet 1 tablet daily.   Marland Kitchen nystatin [facility]) powder Apply topically 2 (two) times daily as needed.   Marland Kitchen omeprazole (PRILOSEC) 40 MG  capsule take 1 capsule by mouth once  daily BEFORE BREAKFAST   . potassium chloride (K-DUR) 10 MEQ tablet Take 1 tablet by mouth daily. 04/26/2015: Received from: External Pharmacy Received Sig:   . Respiratory Therapy Supplies (FLUTTER) DEVI USE AS DIRECTED   . SYMBICORT 80-4.5 MCG/ACT inhaler inhale 2 puffs by mouth twice a day Rinse mouth after use   . temazepam (RESTORIL) 30 MG capsule Take 1 capsule (30 mg total) by mouth at bedtime as needed for sleep.   . Vitamin D, Ergocalciferol, (DRISDOL) 50000 units CAPS capsule take 1 capsule by mouth every week FOR 16 WEEKS   . XARELTO 20 MG TABS tablet take 1 tablet by mouth once daily WITH SUPPER   . [DISCONTINUED] nystatin [facility]) powder Apply topically 2 (two) times daily as needed.   . [DISCONTINUED] temazepam (RESTORIL) 30 MG capsule Take 1 capsule (30 mg total) by mouth at bedtime as needed for sleep.   . [DISCONTINUED] APPLE CIDER VINEGAR PO Take by mouth.    Facility-Administered Encounter Medications as of 10/10/2017  Medication  . 0.9 %  sodium chloride infusion    ALLERGIES:  Allergies  Allergen Reactions  . Shellfish Allergy Hives and Shortness Of Breath  . Ace Inhibitors Cough  . Epinephrine Other (See Comments)    Pallpitations during dental procedures  . Theophyllines Nausea And Vomiting and Other (See Comments)    Heart races and pounds  . Propoxyphene Hcl Nausea And Vomiting     PHYSICAL EXAM:  ECOG Performance status: 1 - Symptomatic, but independent   Vitals:   10/10/17 0944  BP: (!) 157/62  Pulse: 74  Resp: (!) 22  Temp: 98.1 F (36.7 C)  SpO2: 96%   Filed Weights   10/10/17 0944  Weight: 273 lb (123.8 kg)   Physical Exam  Constitutional: She is oriented to person, place, and time. She appears well-developed and well-nourished. No distress.  HENT:  Head: Normocephalic and atraumatic.  Eyes: Conjunctivae are normal.  Neck: Normal range of motion. No tracheal deviation present. No thyromegaly present.  Cardiovascular: Normal rate and  regular rhythm.  Pulmonary/Chest: Breath sounds normal. She has no wheezes. She exhibits no tenderness.  Abdominal: Soft.  Musculoskeletal: She exhibits no edema or deformity.  Lymphadenopathy:    She has no cervical adenopathy.  Neurological: She is alert and oriented to person, place, and time. No cranial nerve deficit.  Skin: No pallor.  Psychiatric: She has a normal mood and affect. Her behavior is normal. Judgment and thought content normal.   Breast exam bilaterally shows no palpable lumps, no skin changes, lumpectomy scar clean, no palpable axillary nodes bilaterally LABORATORY DATA:  I have reviewed the labs as listed.  CBC    Component Value Date/Time   WBC 7.6 10/10/2017 0851   RBC 3.69 (L) 10/10/2017 0851   HGB 9.8 (L) 10/10/2017 0851   HCT 32.6 (L) 10/10/2017 0851   PLT 367 10/10/2017 0851   MCV 88.3 10/10/2017 0851   MCH 26.6 10/10/2017 0851   MCHC 30.1 10/10/2017 0851   RDW 16.1 (H) 10/10/2017 0851   LYMPHSABS 0.9 10/10/2017 0851   MONOABS 0.5 10/10/2017 0851   EOSABS 0.2 10/10/2017 0851   BASOSABS 0.0 10/10/2017 0851   CMP Latest Ref Rng & Units 10/10/2017 01/18/2017 07/20/2016  Glucose 65 - 99 mg/dL 141(H) 169(H) 160(H)  BUN 6 - 20 mg/dL _0 Creatinine 0.44 - 1.00 mg/dL 1.16(H) 1.30(H) 1.28(H)  Sodium 135 - 145 mmol/L 137 137 135  Potassium 3.5 - 5.1 mmol/L 3.7 3.6 3.5  Chloride 101 - 111 mmol/L 94(L) 99(L) 98(L)  CO2 22 - 32 mmol/L _0 Calcium 8.9 - 10.3 mg/dL 9.5 9.2 9.6  Total Protein 6.5 - 8.1 g/dL 8.4(H) 7.7 8.1  Total Bilirubin 0.3 - 1.2 mg/dL 0.5 0.5 0.3  Alkaline Phos 38 - 126 U/L 67 64 64  AST 15 - 41 U/L _1 ALT 14 - 54 U/L 13(L) 27 22    ASSESSMENT & PLAN:   Stage IA left breast invasive ductal carcinoma; ER+/PR+/HER2-: -Diagnosed in 11/2014; treated with (L) breast lumpectomy with Dr. Dalbert Murphy, followed by adjuvant radiation therapy with Dr. Pablo Murphy. Completed radiation in 04/2015. Started anti-estrogen therapy with  Anastrozole in 05/2015. Plan to continue anti-estrogen therapy for 5-10 years.  Tolerating well. -Clinical breast exam benign today.  -Most recent mammogram completed at [facility] on 12/28/16; report reviewed and no evidence of malignancy.  -Next mammogram due in 12/2017 at [facility]; orders placed today.  -Continue anastrozole.  -Return to cancer center in 6 months for continued surveillance.   Bone health:  -Last DEXA scan on 04/30/15 showed osteopenia (T-score -1.1). She did not do DEXA as ordered in 04/2017, she wants to have her PCP order that and do it at his office at her upcoming visit. Will review results at her next visit  -Continue calcium/vitamin D supplementation and weight-bearing exercises, as tolerated.  -Labs reviewed and calcium adequate for Prolia injection today.  -Continue Prolia injections every 6 months as scheduled.  - Fungal skin rash: resolved, with nystatin, refill Nystatin to use PRN - -Insomnia:   improved with restoril, refilled as requested. Fatigue improved as well    Anemia- hgb 9.8 today, creatinine 1.3 stable, likely due to CKD stage III , awaiting EPO level and Iron panel--Will contact her with lab results , consider starting epo supplmentation if hgb remains low. She received IV iron in the past , last given in 01/2017 GI work up in the past was negative. B12 pending. I will request SPEP/IFE for her next visit.  Atrial flutter- now in SR- on Xarelto- followed by PCP.  Strong family history of cancer-  previously addressed, genetic testing - no identifiable mutations per notes.  Dispo:  -proceed with Prolia itoday -Return to cancer center in 6 months for continued surveillance with labs   Orders placed this encounter:  Orders Placed This Encounter  Procedures  . MM Digital Diagnostic Bilat  . Multiple Myeloma Panel (SPEP&IFE w/QIG)

## 2017-10-10 NOTE — Patient Instructions (Signed)
[facility] at [facility] Discharge Instructions  RECOMMENDATIONS MADE BY THE CONSULTANT AND ANY TEST RESULTS WILL BE SENT TO YOUR REFERRING PHYSICIAN.  You were seen today by Dr. Peru  Thank you for choosing  Cancer Center at [facility] to provide your oncology and hematology care.  To afford each patient quality time with our provider, please arrive at least 15 minutes before your scheduled appointment time.    If you have a lab appointment with the Cancer Center please come in thru the  Main Entrance and check in at the main information desk  You need to re-schedule your appointment should you arrive 10 or more minutes late.  We strive to give you quality time with our providers, and arriving late affects you and other patients whose appointments are after yours.  Also, if you no show three or more times for appointments you may be dismissed from the clinic at the providers discretion.     Again, thank you for choosing [facility].  Our hope is that these requests will decrease the amount of time that you wait before being seen by our physicians.       _____________________________________________________________  Should you have questions after your visit to [facility], please contact our office at [phone] between the hours of 8:30 a.m. and 4:30 p.m.  Voicemails left after 4:30 p.m. will not be returned until the following business day.  For prescription refill requests, have your pharmacy contact our office.       Resources For Cancer Patients and their Caregivers ? American Cancer Society: Can assist with transportation, wigs, general needs, runs Look Good Feel Better.        1-888-227-6333 ? Cancer Care: Provides financial assistance, online support groups, medication/co-pay assistance.  1-800-813-HOPE (4673) ? [facility] Assists [place] cancer patients and their families  through emotional , educational and financial support.  336-427-4357 ? [place] DSS Where to apply for food stamps, Medicaid and utility assistance. 336-342-1394 ? [facility]: Transportation to medical appointments. 336-347-2287 ? Social Security Administration: May apply for disability if have a Stage IV cancer. 336-342-7796 1-800-772-1213 ? [place] Aging, Disability and Transit Services: Assists with nutrition, care and transit needs. 336-349-2343  Cancer Center Support Programs: @10RELATIVEDAYS@ > Cancer Support Group  2nd Tuesday of the month 1pm-2pm, Journey Room  > Creative Journey  3rd Tuesday of the month 1130am-1pm, Journey Room  > Look Good Feel Better  1st Wednesday of the month 10am-12 noon, Journey Room (Call American Cancer Society to register 1-800-395-5775)     

## 2017-10-10 NOTE — Progress Notes (Signed)
Margretta Ditty presents today for injection per the provider's orders.  Prolia administration without incident; see MAR for injection details.  Patient tolerated procedure well and without incident.  No questions or complaints noted at this time.  Discharged ambulatory.

## 2017-10-11 LAB — ERYTHROPOIETIN: ERYTHROPOIETIN: 21.9 m[IU]/mL — AB (ref 2.6–18.5)

## 2017-10-11 LAB — VITAMIN D 25 HYDROXY (VIT D DEFICIENCY, FRACTURES): VIT D 25 HYDROXY: 44.4 ng/mL (ref 30.0–100.0)

## 2017-10-12 ENCOUNTER — Telehealth (HOSPITAL_COMMUNITY): Payer: Self-pay | Admitting: *Deleted

## 2017-10-12 NOTE — Telephone Encounter (Signed)
I attempted to call patient, got her voicemail.  I advised her of needing B-12 and an Iron infusion and told her that if she agrees to getting them to call our office to schedule her appointments.  Detailed message was left with phone number for cancer center.

## 2017-10-12 NOTE — Telephone Encounter (Signed)
-----   Message from Creola Corn, MD sent at 10/11/2017  4:26 PM EST ----- Could you please schedule her for B12 and Iv iron - see treatment plan- she has been very tired, try to start asap- thanks

## 2017-10-17 ENCOUNTER — Ambulatory Visit (HOSPITAL_COMMUNITY): Payer: Medicare Other | Admitting: Nurse Practitioner

## 2017-10-18 ENCOUNTER — Other Ambulatory Visit (HOSPITAL_COMMUNITY): Payer: Self-pay | Admitting: *Deleted

## 2017-10-23 ENCOUNTER — Encounter (HOSPITAL_COMMUNITY): Payer: Self-pay | Admitting: Nurse Practitioner

## 2017-10-23 ENCOUNTER — Ambulatory Visit (HOSPITAL_COMMUNITY)
Admission: RE | Admit: 2017-10-23 | Discharge: 2017-10-23 | Disposition: A | Discharge status: Home / Self Care | Payer: Medicare Other | Source: Ambulatory Visit | Attending: Nurse Practitioner | Admitting: Nurse Practitioner

## 2017-10-23 ENCOUNTER — Other Ambulatory Visit: Payer: Self-pay

## 2017-10-23 DIAGNOSIS — Z79899 Other long term (current) drug therapy: Secondary | ICD-10-CM | POA: Diagnosis not present

## 2017-10-23 DIAGNOSIS — J449 Chronic obstructive pulmonary disease, unspecified: Secondary | ICD-10-CM | POA: Insufficient documentation

## 2017-10-23 DIAGNOSIS — Z803 Family history of malignant neoplasm of breast: Secondary | ICD-10-CM | POA: Diagnosis not present

## 2017-10-23 DIAGNOSIS — Z7901 Long term (current) use of anticoagulants: Secondary | ICD-10-CM | POA: Insufficient documentation

## 2017-10-23 DIAGNOSIS — G4733 Obstructive sleep apnea (adult) (pediatric): Secondary | ICD-10-CM | POA: Insufficient documentation

## 2017-10-23 DIAGNOSIS — Z9889 Other specified postprocedural states: Secondary | ICD-10-CM | POA: Diagnosis not present

## 2017-10-23 DIAGNOSIS — M797 Fibromyalgia: Secondary | ICD-10-CM | POA: Diagnosis not present

## 2017-10-23 DIAGNOSIS — I480 Paroxysmal atrial fibrillation: Secondary | ICD-10-CM | POA: Diagnosis not present

## 2017-10-23 DIAGNOSIS — E785 Hyperlipidemia, unspecified: Secondary | ICD-10-CM | POA: Insufficient documentation

## 2017-10-23 DIAGNOSIS — Z91013 Allergy to seafood: Secondary | ICD-10-CM | POA: Diagnosis not present

## 2017-10-23 DIAGNOSIS — K219 Gastro-esophageal reflux disease without esophagitis: Secondary | ICD-10-CM | POA: Diagnosis not present

## 2017-10-23 DIAGNOSIS — Z87891 Personal history of nicotine dependence: Secondary | ICD-10-CM | POA: Diagnosis not present

## 2017-10-23 DIAGNOSIS — Z853 Personal history of malignant neoplasm of breast: Secondary | ICD-10-CM | POA: Diagnosis not present

## 2017-10-23 DIAGNOSIS — K449 Diaphragmatic hernia without obstruction or gangrene: Secondary | ICD-10-CM | POA: Diagnosis not present

## 2017-10-23 DIAGNOSIS — Z888 Allergy status to other drugs, medicaments and biological substances status: Secondary | ICD-10-CM | POA: Diagnosis not present

## 2017-10-23 DIAGNOSIS — Z833 Family history of diabetes mellitus: Secondary | ICD-10-CM | POA: Insufficient documentation

## 2017-10-23 DIAGNOSIS — Z800 Family history of malignant neoplasm of digestive organs: Secondary | ICD-10-CM | POA: Insufficient documentation

## 2017-10-23 DIAGNOSIS — Z825 Family history of asthma and other chronic lower respiratory diseases: Secondary | ICD-10-CM | POA: Diagnosis not present

## 2017-10-23 DIAGNOSIS — I4891 Unspecified atrial fibrillation: Secondary | ICD-10-CM | POA: Diagnosis present

## 2017-10-23 DIAGNOSIS — Z96659 Presence of unspecified artificial knee joint: Secondary | ICD-10-CM | POA: Insufficient documentation

## 2017-10-23 DIAGNOSIS — M1990 Unspecified osteoarthritis, unspecified site: Secondary | ICD-10-CM | POA: Diagnosis not present

## 2017-10-23 DIAGNOSIS — Z7984 Long term (current) use of oral hypoglycemic drugs: Secondary | ICD-10-CM | POA: Insufficient documentation

## 2017-10-23 DIAGNOSIS — Z801 Family history of malignant neoplasm of trachea, bronchus and lung: Secondary | ICD-10-CM | POA: Insufficient documentation

## 2017-10-23 DIAGNOSIS — Z8249 Family history of ischemic heart disease and other diseases of the circulatory system: Secondary | ICD-10-CM | POA: Insufficient documentation

## 2017-10-23 DIAGNOSIS — F419 Anxiety disorder, unspecified: Secondary | ICD-10-CM | POA: Diagnosis not present

## 2017-10-23 DIAGNOSIS — Z823 Family history of stroke: Secondary | ICD-10-CM | POA: Insufficient documentation

## 2017-10-23 DIAGNOSIS — I10 Essential (primary) hypertension: Secondary | ICD-10-CM | POA: Diagnosis not present

## 2017-10-23 MED ORDER — RIVAROXABAN 20 MG PO TABS: ORAL_TABLET | ORAL | 1 refills | Status: DC

## 2017-10-23 MED ORDER — FLECAINIDE ACETATE 50 MG PO TABS: 50.0000 mg | ORAL_TABLET | Freq: Two times a day (BID) | ORAL | 1 refills | Status: DC

## 2017-10-23 NOTE — Progress Notes (Signed)
Primary Care Physician: [name], MD Referring Physician: Dr. Rayann Heman Cardiologist: Dr. Martinique   [name] is a 67 y.o. female with a h/o persistent afib that is in the afib clinic for f/u. She continues on flecainide 50 mg bid and xarelto 20 mg qd. She does not report any breakthrough afib, no bleeding issues. She had a fall in August with laceration to her knee.   Today, she denies symptoms of palpitations, chest pain, shortness of breath, orthopnea, PND, lower extremity edema, dizziness, presyncope, syncope, or neurologic sequela. The patient is tolerating medications without difficulties and is otherwise without complaint today.   Past Medical History:  Diagnosis Date  . Acute renal failure ([place]) 01/27/2015  . Acute respiratory failure ([place]) 01/27/2015  . Adenomatous colon polyp 1993  . Allergy   . Anemia   . Anxiety   . Anxiety disorder   . Asthma    Spirometry 1989: FEV1 2.12 (71%) with ratio 82.  HFA 90% after coaching 11-09-10  . Atrial flutter ([place]) chadsvasc of 3 (11/19/14)   Status post cardioversion 2-12 2013. s/p RFCA 5/13 with JAllred (coumadin and amio stopped)   . Breast cancer ([place])    left  . COPD (chronic obstructive pulmonary disease) ([place])   . DDD (degenerative disc disease)   . Diverticulosis   . Essential hypertension, benign   . Fibromyalgia   . GERD (gastroesophageal reflux disease)    EGD with HH / esophagitis 06-30-93  . Hiatal hernia   . History of atrial fibrillation   . Hyperlipidemia   . Internal and external hemorrhoids without complication   . Lipoma of colon   . Migraine   . Osteoarthritis   . Pelvic fracture ([place])   . Personal history of colonic polyps 06/14/2010  . Pneumonia   . PONV (postoperative nausea and vomiting)   . Radiation    left breast cancer  . Sleep apnea    cpap   Past Surgical History:  Procedure Laterality Date  . A FLUTTER ABLATION  02/22/2012  . ATRIAL FLUTTER ABLATION N/A 02/22/2012   Procedure:  ATRIAL FLUTTER ABLATION;  Surgeon: Thompson Grayer, MD;  Location: [facility] CATH LAB;  Service: Cardiovascular;  Laterality: N/A;  . BREAST BIOPSY Left 11/2014  . CARDIOVERSION N/A 02/19/2014   Procedure: CARDIOVERSION;  Surgeon: Dorothy Spark, MD;  Location: [facility];  Service: Cardiovascular;  Laterality: N/A;  . COLONOSCOPY  05/2010, 07/2000  . DILATION AND CURETTAGE OF UTERUS    . ESOPHAGOGASTRODUODENOSCOPY  05/2010, 06/1993  . JOINT REPLACEMENT    . POLYPECTOMY    . RADIOACTIVE SEED GUIDED PARTIAL MASTECTOMY WITH AXILLARY SENTINEL LYMPH NODE BIOPSY Left 01/26/2015   Procedure: LEFT  PARTIAL MASTECTOMY WITH  RADIOACTIVE SEED LOCALIZATION, LEFT AXILLARY SENTINEL LYMPH NODE BIOPSY;  Surgeon: Fanny Skates, MD;  Location: [facility];  Service: General;  Laterality: Left;  . ROTATOR CUFF REPAIR     bilateral  . ROTATOR CUFF REPAIR     bil  . TOTAL KNEE ARTHROPLASTY     x2 09/2006  . UPPER GASTROINTESTINAL ENDOSCOPY      Current Outpatient Medications  Medication Sig Dispense Refill  . acetaminophen (TYLENOL) 500 MG tablet Take 1,000-1,500 mg by mouth 2 (two) times daily as needed for headache (migraines). Reported on 09/08/2015    . Albuterol Sulfate (PROAIR RESPICLICK) 825 (90 Base) MCG/ACT AEPB Inhale 2 puffs into the lungs every 4 (four) hours as needed. 1 each 4  . ALPRAZolam (XANAX) 0.5 MG tablet  Take 0.5 mg by mouth 2 (two) times daily as needed for anxiety.   0  . anastrozole (ARIMIDEX) 1 MG tablet Take 1 tablet (1 mg total) by mouth daily. 30 tablet 5  . atorvastatin (LIPITOR) 10 MG tablet Take 10 mg by mouth daily.  0  . bisoprolol (ZEBETA) 10 MG tablet Take 1 tablet (10 mg total) by mouth daily. 30 tablet 2  . calcium-vitamin D (OSCAL WITH D) 500-200 MG-UNIT per tablet Take 1 tablet by mouth daily with breakfast. 30 tablet 3  . CARTIA XT 180 MG 24 hr capsule take 1 capsule by mouth once daily 90 capsule 3  . celecoxib (CELEBREX) 200 MG capsule Take 1 capsule by mouth every 3-4 days  0  .  cyanocobalamin (,VITAMIN B-12,) 1000 MCG/ML injection Inject 1,000 mcg into the skin once. Inject once weekly x 4 weeks, then switch to monthly injections    . flecainide (TAMBOCOR) 50 MG tablet Take 1 tablet (50 mg total) by mouth 2 (two) times daily. 180 tablet 1  . furosemide (LASIX) 40 MG tablet Take 40 mg by mouth 3 (three) times a week.   0  . GARCINIA CAMBOGIA-CHROMIUM PO Take by mouth.    . hydrOXYzine (ATARAX/VISTARIL) 25 MG tablet Take 1 tablet by mouth every 6 (six) hours as needed. Itching  0  . losartan (COZAAR) 100 MG tablet Take 100 mg by mouth daily.   1  . metFORMIN (GLUCOPHAGE) 500 MG tablet 1 tablet 2 (two) times daily with a meal.     . nystatin (NYAMYC) powder Apply topically 2 (two) times daily as needed. 15 g 1  . omeprazole (PRILOSEC) 40 MG capsule take 1 capsule by mouth once daily BEFORE BREAKFAST 90 capsule 1  . potassium chloride (K-DUR) 10 MEQ tablet Take 1 tablet by mouth daily.  0  . Respiratory Therapy Supplies (FLUTTER) DEVI USE AS DIRECTED 1 each 0  . rivaroxaban (XARELTO) 20 MG TABS tablet take 1 tablet by mouth once daily WITH SUPPER 90 tablet 1  . SYMBICORT 80-4.5 MCG/ACT inhaler inhale 2 puffs by mouth twice a day Rinse mouth after use 30.6 g 0  . temazepam (RESTORIL) 30 MG capsule Take 1 capsule (30 mg total) by mouth at bedtime as needed for sleep. 30 capsule 2  . Vitamin D, Ergocalciferol, (DRISDOL) 50000 units CAPS capsule take 1 capsule by mouth every week FOR 16 WEEKS 16 capsule 1  . cyclobenzaprine (FLEXERIL) 10 MG tablet Take 1 tablet by mouth 3 (three) times daily. As needed  0  . fluticasone (FLONASE) 50 MCG/ACT nasal spray Place 2 sprays into both nostrils daily as needed for allergies.   0   No current facility-administered medications for this encounter.    Facility-Administered Medications Ordered in Other Encounters  Medication Dose Route Frequency Provider Last Rate Last Dose  . 0.9 %  sodium chloride infusion   Intravenous Continuous  [place], Kelby Fam, MD        Allergies  Allergen Reactions  . Shellfish Allergy Hives and Shortness Of Breath  . Ace Inhibitors Cough  . Epinephrine Other (See Comments)    Pallpitations during dental procedures  . Theophyllines Nausea And Vomiting and Other (See Comments)    Heart races and pounds  . Propoxyphene Hcl Nausea And Vomiting    Social History   Socioeconomic History  . Marital status: Widowed    Spouse name: Not on file  . Number of children: 0  . Years of education: Not on  file  . Highest education level: Not on file  Social Needs  . Financial resource strain: Not on file  . Food insecurity - worry: Not on file  . Food insecurity - inability: Not on file  . Transportation needs - medical: Not on file  . Transportation needs - non-medical: Not on file  Occupational History  . Occupation: Database administrator at [employer]  Tobacco Use  . Smoking status: Former Smoker    Packs/day: 1.00    Years: 20.00    Pack years: 20.00    Types: Cigarettes    Last attempt to quit: 09/25/1984    Years since quitting: 33.0  . Smokeless tobacco: Never Used  Substance and Sexual Activity  . Alcohol use: No    Alcohol/week: 0.0 oz  . Drug use: No  . Sexual activity: Not Currently  Other Topics Concern  . Not on file  Social History Narrative  . Not on file    Family History  Problem Relation Age of Onset  . Emphysema Father        smoker  . Heart disease Father   . Breast cancer Mother 74  . Breast cancer Sister 86  . Heart Problems Sister   . Heart disease Sister   . Breast cancer Maternal Aunt 48  . Lung cancer Maternal Uncle 67       smoker  . Colon cancer Paternal Uncle 31  . Diabetes Maternal Grandmother   . Heart Problems Maternal Grandfather   . Stroke Paternal Grandfather   . Lung cancer Maternal Uncle 72       smoker  . Heart Problems Paternal Uncle   . Diabetes Paternal Grandmother   . Rectal cancer Neg Hx   . Stomach cancer Neg  Hx   . Esophageal cancer Neg Hx     ROS- All systems are reviewed and negative except as per the HPI above  Physical Exam: Vitals:   10/23/17 0939  BP: (!) 174/82  Pulse: 68  Weight: 273 lb (123.8 kg)  Height: 5\' 5"  (1.651 m)   Wt Readings from Last 3 Encounters:  10/23/17 273 lb (123.8 kg)  10/10/17 273 lb (123.8 kg)  05/03/17 250 lb (113.4 kg)    Labs: Lab Results  Component Value Date   NA 137 10/10/2017   K 3.7 10/10/2017   CL 94 (L) 10/10/2017   CO2 29 10/10/2017   GLUCOSE 141 (H) 10/10/2017   BUN 19 10/10/2017   CREATININE 1.16 (H) 10/10/2017   CALCIUM 9.5 10/10/2017   PHOS 5.4 (H) 01/28/2015   MG 2.0 01/28/2015   Lab Results  Component Value Date   INR 1.18 01/25/2015   No results found for: CHOL, HDL, LDLCALC, TRIG   GEN- The patient is well appearing, alert and oriented x 3 today.   Head- normocephalic, atraumatic Eyes-  Sclera clear, conjunctiva pink Ears- hearing intact Oropharynx- clear Neck- supple, no JVP Lymph- no cervical lymphadenopathy Lungs- Clear to ausculation bilaterally, normal work of breathing Heart- Regular rate and rhythm, no murmurs, rubs or gallops, PMI not laterally displaced GI- soft, NT, ND, + BS Extremities- no clubbing, cyanosis, or edema MS- no significant deformity or atrophy Skin- no rash or lesion Psych- euthymic mood, full affect Neuro- strength and sensation are intact  EKG-NSR at 68 bpm, pr int 172 ms, qrs int 88 ms, qtc 465 ms Epic records reviewed   Assessment and Plan: 1. Persistent afib  Maintaining SR  Pt had multiple cancelled  appointments and had to limit refills to 30 days to come for renewal of flecainide and DOAC Asked pt to come in in 6 months so flecainide/doac  use can be safely supervised    Continue flecainide 50 mg bid  Continue bisoprolol 10 mg qd Continue  xarelto 20 mg bid crcl cal at 89.48 ms, appropriately dosed, chadsvasc score of at least 3  2. OSA Continue compliance with  CPAP  3. Morbid obesity Weight loss encouraged  4. HTN Stable  F/u in afib clinic in 6 months  Butch Penny C. Rishab Stoudt, [facility] [address] [place], [place] [address] [phone]

## 2017-10-24 ENCOUNTER — Inpatient Hospital Stay (HOSPITAL_COMMUNITY): Payer: Medicare Other

## 2017-10-24 ENCOUNTER — Encounter (HOSPITAL_COMMUNITY): Payer: Self-pay

## 2017-10-24 VITALS — BP 136/55 | HR 57 | Temp 98.1°F | Resp 20

## 2017-10-24 DIAGNOSIS — Z170 Estrogen receptor positive status [ER+]: Secondary | ICD-10-CM

## 2017-10-24 DIAGNOSIS — M8580 Other specified disorders of bone density and structure, unspecified site: Secondary | ICD-10-CM

## 2017-10-24 DIAGNOSIS — C50212 Malignant neoplasm of upper-inner quadrant of left female breast: Secondary | ICD-10-CM | POA: Diagnosis not present

## 2017-10-24 DIAGNOSIS — Z79899 Other long term (current) drug therapy: Secondary | ICD-10-CM

## 2017-10-24 MED ORDER — SODIUM CHLORIDE 0.9 % IV SOLN
750.0000 mg | Freq: Once | INTRAVENOUS | Status: DC
  Filled 2017-10-24: qty 15

## 2017-10-24 MED ORDER — SODIUM CHLORIDE 0.9 % IV SOLN
750.0000 mg | Freq: Once | INTRAVENOUS | Status: AC
  Administered 2017-10-24: 750 mg via INTRAVENOUS
  Filled 2017-10-24: qty 15

## 2017-10-24 MED ORDER — SODIUM CHLORIDE 0.9 % IV SOLN
Freq: Once | INTRAVENOUS | Status: AC
  Administered 2017-10-24: 14:00:00 via INTRAVENOUS

## 2017-10-24 NOTE — Progress Notes (Signed)
[name] tolerated Injectafer infusion well without complaints or incident. VSS upon discharge.Pt discharged self ambulatory using walker in satisfactory condition accompanied by family member

## 2017-10-24 NOTE — Patient Instructions (Signed)
[facility] at [facility] Discharge Instructions  RECOMMENDATIONS MADE BY THE CONSULTANT AND ANY TEST RESULTS WILL BE SENT TO YOUR REFERRING PHYSICIAN.  Received Injectafer infusion today. Follow-up as scheduled. Call clinic for any questions or concerns  Thank you for choosing [facility] at [facility] to provide your oncology and hematology care.  To afford each patient quality time with our provider, please arrive at least 15 minutes before your scheduled appointment time.    If you have a lab appointment with the Cancer Center please come in thru the  Main Entrance and check in at the main information desk  You need to re-schedule your appointment should you arrive 10 or more minutes late.  We strive to give you quality time with our providers, and arriving late affects you and other patients whose appointments are after yours.  Also, if you no show three or more times for appointments you may be dismissed from the clinic at the providers discretion.     Again, thank you for choosing [facility].  Our hope is that these requests will decrease the amount of time that you wait before being seen by our physicians.       _____________________________________________________________  Should you have questions after your visit to [facility], please contact our office at [phone] between the hours of 8:30 a.m. and 4:30 p.m.  Voicemails left after 4:30 p.m. will not be returned until the following business day.  For prescription refill requests, have your pharmacy contact our office.       Resources For Cancer Patients and their Caregivers ? American Cancer Society: Can assist with transportation, wigs, general needs, runs Look Good Feel Better.        1-888-227-6333 ? Cancer Care: Provides financial assistance, online support groups, medication/co-pay assistance.  1-800-813-HOPE (4673) ? [facility] Assists [place] cancer patients and their families through emotional , educational and financial support.  336-427-4357 ? [place] DSS Where to apply for food stamps, Medicaid and utility assistance. 336-342-1394 ? RCATS: Transportation to medical appointments. 336-347-2287 ? Social Security Administration: May apply for disability if have a Stage IV cancer. 336-342-7796 1-800-772-1213 ? [place] Aging, Disability and Transit Services: Assists with nutrition, care and transit needs. 336-349-2343  Cancer Center Support Programs: @10RELATIVEDAYS@ > Cancer Support Group  2nd Tuesday of the month 1pm-2pm, Journey Room  > Creative Journey  3rd Tuesday of the month 1130am-1pm, Journey Room  > Look Good Feel Better  1st Wednesday of the month 10am-12 noon, Journey Room (Call American Cancer Society to register 1-800-395-5775)   

## 2017-10-25 ENCOUNTER — Other Ambulatory Visit (HOSPITAL_COMMUNITY): Payer: Self-pay

## 2017-10-25 DIAGNOSIS — C50212 Malignant neoplasm of upper-inner quadrant of left female breast: Secondary | ICD-10-CM

## 2017-10-25 DIAGNOSIS — Z170 Estrogen receptor positive status [ER+]: Principal | ICD-10-CM

## 2017-10-25 MED ORDER — ANASTROZOLE 1 MG PO TABS: 1.0000 mg | ORAL_TABLET | Freq: Every day | ORAL | 5 refills | Status: DC

## 2017-10-25 NOTE — Telephone Encounter (Signed)
Received refill request from patients pharmacy for anastrazole. Reviewed with provider, chart checked and refilled.

## 2017-10-30 ENCOUNTER — Ambulatory Visit (HOSPITAL_COMMUNITY): Payer: Medicare Other | Admitting: Adult Health

## 2017-10-31 ENCOUNTER — Ambulatory Visit (HOSPITAL_COMMUNITY): Payer: Medicare Other

## 2017-11-05 ENCOUNTER — Ambulatory Visit: Payer: Medicare Other | Admitting: Adult Health

## 2017-11-08 ENCOUNTER — Inpatient Hospital Stay (HOSPITAL_COMMUNITY): Payer: Medicare Other | Attending: Adult Health

## 2017-11-08 ENCOUNTER — Encounter (HOSPITAL_COMMUNITY): Payer: Self-pay

## 2017-11-08 VITALS — BP 132/54 | HR 69 | Temp 97.9°F | Resp 18 | Wt 267.0 lb

## 2017-11-08 DIAGNOSIS — D509 Iron deficiency anemia, unspecified: Secondary | ICD-10-CM | POA: Diagnosis not present

## 2017-11-08 DIAGNOSIS — C50212 Malignant neoplasm of upper-inner quadrant of left female breast: Secondary | ICD-10-CM

## 2017-11-08 DIAGNOSIS — M8580 Other specified disorders of bone density and structure, unspecified site: Secondary | ICD-10-CM

## 2017-11-08 DIAGNOSIS — Z170 Estrogen receptor positive status [ER+]: Secondary | ICD-10-CM

## 2017-11-08 DIAGNOSIS — M810 Age-related osteoporosis without current pathological fracture: Secondary | ICD-10-CM | POA: Diagnosis present

## 2017-11-08 DIAGNOSIS — Z79899 Other long term (current) drug therapy: Secondary | ICD-10-CM

## 2017-11-08 MED ORDER — SODIUM CHLORIDE 0.9 % IV SOLN
INTRAVENOUS | Status: DC
  Administered 2017-11-08: 14:00:00 via INTRAVENOUS

## 2017-11-08 MED ORDER — SODIUM CHLORIDE 0.9 % IV SOLN
750.0000 mg | Freq: Once | INTRAVENOUS | Status: DC
  Filled 2017-11-08: qty 15

## 2017-11-08 MED ORDER — SODIUM CHLORIDE 0.9 % IV SOLN
750.0000 mg | Freq: Once | INTRAVENOUS | Status: AC
  Administered 2017-11-08: 750 mg via INTRAVENOUS
  Filled 2017-11-08: qty 15

## 2017-11-08 MED ORDER — CYANOCOBALAMIN 1000 MCG/ML IJ SOLN
1000.0000 ug | Freq: Once | INTRAMUSCULAR | Status: AC
  Administered 2017-11-08: 1000 ug via INTRAMUSCULAR

## 2017-11-08 MED ORDER — CYANOCOBALAMIN 1000 MCG/ML IJ SOLN
INTRAMUSCULAR | Status: AC
  Filled 2017-11-08: qty 1

## 2017-11-08 NOTE — Patient Instructions (Addendum)
[place] [facility] at [facility] Discharge Instructions  RECOMMENDATIONS MADE BY THE CONSULTANT AND ANY TEST RESULTS WILL BE SENT TO YOUR REFERRING PHYSICIAN.  Received Injectafer infusion and Vit B12 injection today. Follow-up as scheduled. Call clinic for any questions or concerns  Thank you for choosing [place] at [facility] to provide your oncology and hematology care.  To afford each patient quality time with our provider, please arrive at least 15 minutes before your scheduled appointment time.    If you have a lab appointment with the [facility] please come in thru the  Main Entrance and check in at the main information desk  You need to re-schedule your appointment should you arrive 10 or more minutes late.  We strive to give you quality time with our providers, and arriving late affects you and other patients whose appointments are after yours.  Also, if you no show three or more times for appointments you may be dismissed from the clinic at the providers discretion.     Again, thank you for choosing [facility].  Our hope is that these requests will decrease the amount of time that you wait before being seen by our physicians.       _____________________________________________________________  Should you have questions after your visit to [facility], please contact our office at [phone] between the hours of 8:30 a.m. and 4:30 p.m.  Voicemails left after 4:30 p.m. will not be returned until the following business day.  For prescription refill requests, have your pharmacy contact our office.       Resources For Cancer Patients and their Caregivers ? American Cancer Society: Can assist with transportation, wigs, general needs, runs Look Good Feel Better.        619-376-1408 ? Cancer Care: Provides financial assistance, online support groups, medication/co-pay assistance.  1-800-813-HOPE  519 271 7183) ? [place] Assists [place] cancer patients and their families through emotional , educational and financial support.  (940)789-8829 ? [place] DSS Where to apply for food stamps, Medicaid and utility assistance. 515-063-2848 ? RCATS: Transportation to medical appointments. (351)674-5710 ? Social Security Administration: May apply for disability if have a Stage IV cancer. (319) 153-7348 2344172715 ? [employer], Disability and Transit Services: Assists with nutrition, care and transit needs. [place] Support Programs: @10RELATIVEDAYS @ > Cancer Support Group  2nd Tuesday of the month 1pm-2pm, Journey Room  > Creative Journey  3rd Tuesday of the month 1130am-1pm, Journey Room  > Look Good Feel Better  1st Wednesday of the month 10am-12 noon, Journey Room (Call [place] to register [phone])

## 2017-11-08 NOTE — Progress Notes (Signed)
[name] tolerated Vit B12 injection and Injectafer infusion well without complaints or incident. VSS upon discharge. Pt discharged self ambulatory using walker in satisfactory condition

## 2017-11-09 ENCOUNTER — Telehealth: Payer: Self-pay | Admitting: Pulmonary Disease

## 2017-11-09 NOTE — Telephone Encounter (Signed)
Called and spoke with patient placed sample up front for pick up.

## 2017-11-15 ENCOUNTER — Inpatient Hospital Stay (HOSPITAL_COMMUNITY): Payer: Medicare Other

## 2017-11-19 ENCOUNTER — Ambulatory Visit: Payer: Medicare Other | Admitting: Adult Health

## 2017-11-22 ENCOUNTER — Ambulatory Visit (HOSPITAL_COMMUNITY): Payer: Medicare Other

## 2017-11-26 ENCOUNTER — Ambulatory Visit: Payer: Medicare Other | Admitting: Adult Health

## 2017-11-29 ENCOUNTER — Ambulatory Visit (HOSPITAL_COMMUNITY): Payer: Medicare Other

## 2017-12-10 ENCOUNTER — Ambulatory Visit: Payer: Medicare Other | Admitting: Adult Health

## 2017-12-14 ENCOUNTER — Encounter (HOSPITAL_COMMUNITY): Payer: Self-pay | Admitting: Adult Health

## 2017-12-14 ENCOUNTER — Other Ambulatory Visit (HOSPITAL_COMMUNITY): Payer: Self-pay | Admitting: Adult Health

## 2017-12-14 DIAGNOSIS — G4700 Insomnia, unspecified: Secondary | ICD-10-CM

## 2017-12-14 DIAGNOSIS — C50212 Malignant neoplasm of upper-inner quadrant of left female breast: Secondary | ICD-10-CM

## 2017-12-14 DIAGNOSIS — Z170 Estrogen receptor positive status [ER+]: Principal | ICD-10-CM

## 2017-12-14 MED ORDER — TEMAZEPAM 30 MG PO CAPS: 30.0000 mg | ORAL_CAPSULE | Freq: Every evening | ORAL | 2 refills | Status: DC | PRN

## 2017-12-14 NOTE — Progress Notes (Signed)
Received refill request from pt's pharmacy for Temazepam.   [place] Controlled Substance Reporting System reviewed and refill is appropriate on or after 12/14/17. Medication e-scribed to her pharmacy [facility]) using [employer]'s 2-step verification process.    [place]CCSRS reviewed:     Mike Craze, NP [place] [phone]

## 2017-12-17 ENCOUNTER — Ambulatory Visit: Payer: Medicare Other | Admitting: Adult Health

## 2017-12-25 ENCOUNTER — Telehealth (HOSPITAL_COMMUNITY): Payer: Self-pay

## 2017-12-25 NOTE — Telephone Encounter (Signed)
Received message from financial counselor that patients prior auth for temazepam was denied. The provider requested the PCP, Dr. Woody Seller, to start managing her insomnia medication since insurance has different medications they want her to try first. Called and spoke with Otila Kluver, Dr. Woody Seller' nurse. Explained the situation. She states the patient would have to come in to see the doctor before he would order any insomnia medication. Called and notified the patient. She states that was fine and she will make an appt to see her PCP. Faxed denial information to PCP so they can see what insurance recommends.

## 2017-12-26 ENCOUNTER — Other Ambulatory Visit: Payer: Self-pay

## 2017-12-26 DIAGNOSIS — K219 Gastro-esophageal reflux disease without esophagitis: Secondary | ICD-10-CM

## 2017-12-26 MED ORDER — OMEPRAZOLE 40 MG PO CPDR: DELAYED_RELEASE_CAPSULE | ORAL | 1 refills | Status: DC

## 2017-12-31 ENCOUNTER — Ambulatory Visit (HOSPITAL_COMMUNITY): Payer: Medicare Other

## 2018-01-02 ENCOUNTER — Other Ambulatory Visit (HOSPITAL_COMMUNITY): Payer: Self-pay | Admitting: Hematology

## 2018-01-02 DIAGNOSIS — Z853 Personal history of malignant neoplasm of breast: Secondary | ICD-10-CM

## 2018-01-08 ENCOUNTER — Encounter (HOSPITAL_COMMUNITY): Payer: Medicare Other

## 2018-01-08 ENCOUNTER — Ambulatory Visit (HOSPITAL_COMMUNITY): Payer: Medicare Other

## 2018-02-05 ENCOUNTER — Other Ambulatory Visit: Payer: Self-pay | Admitting: Pulmonary Disease

## 2018-03-15 ENCOUNTER — Other Ambulatory Visit (HOSPITAL_COMMUNITY): Payer: Self-pay | Admitting: Nurse Practitioner

## 2018-03-15 DIAGNOSIS — I480 Paroxysmal atrial fibrillation: Secondary | ICD-10-CM

## 2018-03-18 ENCOUNTER — Other Ambulatory Visit (HOSPITAL_COMMUNITY): Payer: Self-pay | Admitting: Nurse Practitioner

## 2018-03-18 DIAGNOSIS — I480 Paroxysmal atrial fibrillation: Secondary | ICD-10-CM

## 2018-03-22 ENCOUNTER — Telehealth (HOSPITAL_COMMUNITY): Payer: Self-pay | Admitting: *Deleted

## 2018-03-22 NOTE — Telephone Encounter (Signed)
Lft msg on pt vcml to clbk re possible medication interaction.  Received a notice from [facility] that this pt was started on Paxil and that with the flecainide this may cause a prolonged QT.   Pt, if still taking Paxil, will need to come in for an EKG to check QT on both of those meds.

## 2018-03-27 NOTE — Telephone Encounter (Signed)
PT contacted re paxil/flecainide.  Pt states that she has been on this medication for years although it was removed from her med list in 2017.  Pt scheduled to come in for visit.

## 2018-03-30 ENCOUNTER — Other Ambulatory Visit (HOSPITAL_COMMUNITY): Payer: Self-pay | Admitting: Nurse Practitioner

## 2018-03-30 DIAGNOSIS — I480 Paroxysmal atrial fibrillation: Secondary | ICD-10-CM

## 2018-04-04 ENCOUNTER — Other Ambulatory Visit (HOSPITAL_COMMUNITY): Payer: Self-pay

## 2018-04-04 ENCOUNTER — Ambulatory Visit (HOSPITAL_COMMUNITY): Payer: Medicare Other | Admitting: Nurse Practitioner

## 2018-04-04 DIAGNOSIS — Z170 Estrogen receptor positive status [ER+]: Principal | ICD-10-CM

## 2018-04-04 DIAGNOSIS — M8580 Other specified disorders of bone density and structure, unspecified site: Secondary | ICD-10-CM

## 2018-04-04 DIAGNOSIS — Z780 Asymptomatic menopausal state: Secondary | ICD-10-CM

## 2018-04-04 DIAGNOSIS — C50212 Malignant neoplasm of upper-inner quadrant of left female breast: Secondary | ICD-10-CM

## 2018-04-09 ENCOUNTER — Ambulatory Visit (HOSPITAL_COMMUNITY): Payer: Medicare Other | Admitting: Hematology

## 2018-04-09 ENCOUNTER — Other Ambulatory Visit (HOSPITAL_COMMUNITY): Payer: Medicare Other

## 2018-04-09 ENCOUNTER — Ambulatory Visit (HOSPITAL_COMMUNITY): Payer: Medicare Other

## 2018-04-16 ENCOUNTER — Ambulatory Visit (HOSPITAL_COMMUNITY)
Admission: RE | Admit: 2018-04-16 | Discharge: 2018-04-16 | Disposition: A | Discharge status: Home / Self Care | Payer: Medicare Other | Source: Ambulatory Visit | Attending: Nurse Practitioner | Admitting: Nurse Practitioner

## 2018-04-16 ENCOUNTER — Encounter (HOSPITAL_COMMUNITY): Payer: Self-pay | Admitting: Nurse Practitioner

## 2018-04-16 DIAGNOSIS — E785 Hyperlipidemia, unspecified: Secondary | ICD-10-CM | POA: Diagnosis not present

## 2018-04-16 DIAGNOSIS — Z8249 Family history of ischemic heart disease and other diseases of the circulatory system: Secondary | ICD-10-CM | POA: Diagnosis not present

## 2018-04-16 DIAGNOSIS — Z79811 Long term (current) use of aromatase inhibitors: Secondary | ICD-10-CM | POA: Insufficient documentation

## 2018-04-16 DIAGNOSIS — Z923 Personal history of irradiation: Secondary | ICD-10-CM | POA: Insufficient documentation

## 2018-04-16 DIAGNOSIS — Z6841 Body Mass Index (BMI) 40.0 and over, adult: Secondary | ICD-10-CM | POA: Insufficient documentation

## 2018-04-16 DIAGNOSIS — F419 Anxiety disorder, unspecified: Secondary | ICD-10-CM | POA: Insufficient documentation

## 2018-04-16 DIAGNOSIS — Z79899 Other long term (current) drug therapy: Secondary | ICD-10-CM | POA: Insufficient documentation

## 2018-04-16 DIAGNOSIS — M1990 Unspecified osteoarthritis, unspecified site: Secondary | ICD-10-CM | POA: Diagnosis not present

## 2018-04-16 DIAGNOSIS — I10 Essential (primary) hypertension: Secondary | ICD-10-CM | POA: Diagnosis not present

## 2018-04-16 DIAGNOSIS — Z7901 Long term (current) use of anticoagulants: Secondary | ICD-10-CM | POA: Insufficient documentation

## 2018-04-16 DIAGNOSIS — Z803 Family history of malignant neoplasm of breast: Secondary | ICD-10-CM | POA: Diagnosis not present

## 2018-04-16 DIAGNOSIS — J449 Chronic obstructive pulmonary disease, unspecified: Secondary | ICD-10-CM | POA: Insufficient documentation

## 2018-04-16 DIAGNOSIS — Z7984 Long term (current) use of oral hypoglycemic drugs: Secondary | ICD-10-CM | POA: Diagnosis not present

## 2018-04-16 DIAGNOSIS — I481 Persistent atrial fibrillation: Secondary | ICD-10-CM | POA: Diagnosis present

## 2018-04-16 DIAGNOSIS — Z7951 Long term (current) use of inhaled steroids: Secondary | ICD-10-CM | POA: Insufficient documentation

## 2018-04-16 DIAGNOSIS — M797 Fibromyalgia: Secondary | ICD-10-CM | POA: Diagnosis not present

## 2018-04-16 DIAGNOSIS — Z853 Personal history of malignant neoplasm of breast: Secondary | ICD-10-CM | POA: Insufficient documentation

## 2018-04-16 DIAGNOSIS — Z86010 Personal history of colonic polyps: Secondary | ICD-10-CM | POA: Insufficient documentation

## 2018-04-16 DIAGNOSIS — I480 Paroxysmal atrial fibrillation: Secondary | ICD-10-CM | POA: Diagnosis not present

## 2018-04-16 DIAGNOSIS — Z888 Allergy status to other drugs, medicaments and biological substances status: Secondary | ICD-10-CM | POA: Diagnosis not present

## 2018-04-16 DIAGNOSIS — Z91013 Allergy to seafood: Secondary | ICD-10-CM | POA: Insufficient documentation

## 2018-04-16 DIAGNOSIS — Z87891 Personal history of nicotine dependence: Secondary | ICD-10-CM | POA: Insufficient documentation

## 2018-04-16 DIAGNOSIS — K219 Gastro-esophageal reflux disease without esophagitis: Secondary | ICD-10-CM | POA: Diagnosis not present

## 2018-04-16 DIAGNOSIS — G4733 Obstructive sleep apnea (adult) (pediatric): Secondary | ICD-10-CM | POA: Diagnosis not present

## 2018-04-16 DIAGNOSIS — Z825 Family history of asthma and other chronic lower respiratory diseases: Secondary | ICD-10-CM | POA: Diagnosis not present

## 2018-04-16 MED ORDER — RIVAROXABAN 20 MG PO TABS: ORAL_TABLET | ORAL | 2 refills | Status: DC

## 2018-04-16 MED ORDER — FLECAINIDE ACETATE 50 MG PO TABS: 50.0000 mg | ORAL_TABLET | Freq: Two times a day (BID) | ORAL | 2 refills | Status: DC

## 2018-04-16 NOTE — Progress Notes (Signed)
Primary Care Physician: [name], MD Referring Physician: Dr. Rayann Heman Cardiologist: Dr. Martinique   [name] is a 67 y.o. female with a h/o morbid obesity, persistent afib that is in the afib clinic for f/u. She continues on flecainide 50 mg bid and xarelto 20 mg qd. She does not report any breakthrough afib, no bleeding issues. She continues on cpap. I received a warning form the pharmacy re the use of paxil and flecainide but the pt states that she has been on both drugs for years and has not had any issues. Her EKG intervals are stable.  Today, she denies symptoms of palpitations, chest pain, shortness of breath, orthopnea, PND, lower extremity edema, dizziness, presyncope, syncope, or neurologic sequela. The patient is tolerating medications without difficulties and is otherwise without complaint today.   Past Medical History:  Diagnosis Date  . Acute renal failure ([place]) 01/27/2015  . Acute respiratory failure ([place]) 01/27/2015  . Adenomatous colon polyp 1993  . Allergy   . Anemia   . Anxiety   . Anxiety disorder   . Asthma    Spirometry 1989: FEV1 2.12 (71%) with ratio 82.  HFA 90% after coaching 11-09-10  . Atrial flutter ([place]) chadsvasc of 3 (11/19/14)   Status post cardioversion 2-12 2013. s/p RFCA 5/13 with JAllred (coumadin and amio stopped)   . Breast cancer ([place])    left  . COPD (chronic obstructive pulmonary disease) ([place])   . DDD (degenerative disc disease)   . Diverticulosis   . Essential hypertension, benign   . Fibromyalgia   . GERD (gastroesophageal reflux disease)    EGD with HH / esophagitis 06-30-93  . Hiatal hernia   . History of atrial fibrillation   . Hyperlipidemia   . Internal and external hemorrhoids without complication   . Lipoma of colon   . Migraine   . Osteoarthritis   . Pelvic fracture ([place])   . Personal history of colonic polyps 06/14/2010  . Pneumonia   . PONV (postoperative nausea and vomiting)   . Radiation    left breast  cancer  . Sleep apnea    cpap   Past Surgical History:  Procedure Laterality Date  . A FLUTTER ABLATION  02/22/2012  . ATRIAL FLUTTER ABLATION N/A 02/22/2012   Procedure: ATRIAL FLUTTER ABLATION;  Surgeon: Thompson Grayer, MD;  Location: [facility] CATH LAB;  Service: Cardiovascular;  Laterality: N/A;  . BREAST BIOPSY Left 11/2014  . CARDIOVERSION N/A 02/19/2014   Procedure: CARDIOVERSION;  Surgeon: Dorothy Spark, MD;  Location: [place] [facility];  Service: Cardiovascular;  Laterality: N/A;  . COLONOSCOPY  05/2010, 07/2000  . DILATION AND CURETTAGE OF UTERUS    . ESOPHAGOGASTRODUODENOSCOPY  05/2010, 06/1993  . JOINT REPLACEMENT    . POLYPECTOMY    . RADIOACTIVE SEED GUIDED PARTIAL MASTECTOMY WITH AXILLARY SENTINEL LYMPH NODE BIOPSY Left 01/26/2015   Procedure: LEFT  PARTIAL MASTECTOMY WITH  RADIOACTIVE SEED LOCALIZATION, LEFT AXILLARY SENTINEL LYMPH NODE BIOPSY;  Surgeon: Fanny Skates, MD;  Location: [facility];  Service: General;  Laterality: Left;  . ROTATOR CUFF REPAIR     bilateral  . ROTATOR CUFF REPAIR     bil  . TOTAL KNEE ARTHROPLASTY     x2 09/2006  . UPPER GASTROINTESTINAL ENDOSCOPY      Current Outpatient Medications  Medication Sig Dispense Refill  . ALPRAZolam (XANAX) 0.5 MG tablet Take 0.5 mg by mouth 2 (two) times daily as needed for anxiety.   0  . anastrozole (ARIMIDEX)  1 MG tablet Take 1 tablet (1 mg total) by mouth daily. 30 tablet 5  . atorvastatin (LIPITOR) 10 MG tablet Take 10 mg by mouth daily.  0  . bisoprolol (ZEBETA) 10 MG tablet Take 1 tablet (10 mg total) by mouth daily. 30 tablet 2  . calcium-vitamin D (OSCAL WITH D) 500-200 MG-UNIT per tablet Take 1 tablet by mouth daily with breakfast. 30 tablet 3  . CARTIA XT 180 MG 24 hr capsule take 1 capsule by mouth once daily 90 capsule 3  . celecoxib (CELEBREX) 200 MG capsule Take 1 capsule by mouth every 3-4 days  0  . cyanocobalamin (,VITAMIN B-12,) 1000 MCG/ML injection Inject 1,000 mcg into the skin once. Inject once weekly x 4 weeks,  then switch to monthly injections    . flecainide (TAMBOCOR) 50 MG tablet Take 1 tablet (50 mg total) by mouth 2 (two) times daily. 180 tablet 2  . fluticasone (FLONASE) 50 MCG/ACT nasal spray Place 2 sprays into both nostrils daily as needed for allergies.   0  . furosemide (LASIX) 40 MG tablet Take 40 mg by mouth 3 (three) times a week.   0  . GARCINIA CAMBOGIA-CHROMIUM PO Take by mouth.    . hydrOXYzine (ATARAX/VISTARIL) 25 MG tablet Take 1 tablet by mouth every 6 (six) hours as needed. Itching  0  . losartan (COZAAR) 100 MG tablet Take 100 mg by mouth daily.   1  . metFORMIN (GLUCOPHAGE) 500 MG tablet 1 tablet 2 (two) times daily with a meal.     . nystatin (NYAMYC) powder Apply topically 2 (two) times daily as needed. 15 g 1  . omeprazole (PRILOSEC) 40 MG capsule take 1 capsule by mouth once daily BEFORE BREAKFAST 90 capsule 1  . PARoxetine (PAXIL) 20 MG tablet Take 20 mg by mouth daily.    . potassium chloride (K-DUR) 10 MEQ tablet Take 1 tablet by mouth daily.  0  . PROAIR RESPICLICK 564 (90 Base) MCG/ACT AEPB INHALE 2 PUFFS BY MOUTH EVERY 4 HOURS IF NEEDED 1 each 0  . Respiratory Therapy Supplies (FLUTTER) DEVI USE AS DIRECTED 1 each 0  . rivaroxaban (XARELTO) 20 MG TABS tablet TAKE 1 TABLET BY MOUTH WITH SUPPER 90 tablet 2  . SYMBICORT 80-4.5 MCG/ACT inhaler inhale 2 puffs by mouth twice a day Rinse mouth after use 30.6 g 0  . temazepam (RESTORIL) 30 MG capsule Take 1 capsule (30 mg total) by mouth at bedtime as needed for sleep. 30 capsule 2  . Vitamin D, Ergocalciferol, (DRISDOL) 50000 units CAPS capsule take 1 capsule by mouth every week FOR 16 WEEKS 16 capsule 1  . cyclobenzaprine (FLEXERIL) 10 MG tablet Take 1 tablet by mouth 3 (three) times daily. As needed  0   No current facility-administered medications for this encounter.    Facility-Administered Medications Ordered in Other Encounters  Medication Dose Route Frequency Provider Last Rate Last Dose  . 0.9 %  sodium chloride  infusion   Intravenous Continuous Penland, Kelby Fam, MD        Allergies  Allergen Reactions  . Shellfish Allergy Hives and Shortness Of Breath  . Ace Inhibitors Cough  . Epinephrine Other (See Comments)    Pallpitations during dental procedures  . Theophyllines Nausea And Vomiting and Other (See Comments)    Heart races and pounds  . Propoxyphene Hcl Nausea And Vomiting    Social History   Socioeconomic History  . Marital status: Widowed    Spouse name: Not  on file  . Number of children: 0  . Years of education: Not on file  . Highest education level: Not on file  Occupational History  . Occupation: Database administrator at [employer]  . Financial resource strain: Not on file  . Food insecurity:    Worry: Not on file    Inability: Not on file  . Transportation needs:    Medical: Not on file    Non-medical: Not on file  Tobacco Use  . Smoking status: Former Smoker    Packs/day: 1.00    Years: 20.00    Pack years: 20.00    Types: Cigarettes    Last attempt to quit: 09/25/1984    Years since quitting: 33.5  . Smokeless tobacco: Never Used  Substance and Sexual Activity  . Alcohol use: No    Alcohol/week: 0.0 oz  . Drug use: No  . Sexual activity: Not Currently  Lifestyle  . Physical activity:    Days per week: Not on file    Minutes per session: Not on file  . Stress: Not on file  Relationships  . Social connections:    Talks on phone: Not on file    Gets together: Not on file    Attends religious service: Not on file    Active member of club or organization: Not on file    Attends meetings of clubs or organizations: Not on file    Relationship status: Not on file  . Intimate partner violence:    Fear of current or ex partner: Not on file    Emotionally abused: Not on file    Physically abused: Not on file    Forced sexual activity: Not on file  Other Topics Concern  . Not on file  Social History Narrative  . Not on file     Family History  Problem Relation Age of Onset  . Emphysema Father        smoker  . Heart disease Father   . Breast cancer Mother 54  . Breast cancer Sister 28  . Heart Problems Sister   . Heart disease Sister   . Breast cancer Maternal Aunt 48  . Lung cancer Maternal Uncle 10       smoker  . Colon cancer Paternal Uncle 48  . Diabetes Maternal Grandmother   . Heart Problems Maternal Grandfather   . Stroke Paternal Grandfather   . Lung cancer Maternal Uncle 72       smoker  . Heart Problems Paternal Uncle   . Diabetes Paternal Grandmother   . Rectal cancer Neg Hx   . Stomach cancer Neg Hx   . Esophageal cancer Neg Hx     ROS- All systems are reviewed and negative except as per the HPI above  Physical Exam: Vitals:   04/16/18 0835  BP: 112/64  Pulse: 66  Weight: 269 lb (122 kg)  Height: 5\' 5"  (1.651 m)   Wt Readings from Last 3 Encounters:  04/16/18 269 lb (122 kg)  11/08/17 267 lb (121.1 kg)  10/23/17 273 lb (123.8 kg)    Labs: Lab Results  Component Value Date   NA 137 10/10/2017   K 3.7 10/10/2017   CL 94 (L) 10/10/2017   CO2 29 10/10/2017   GLUCOSE 141 (H) 10/10/2017   BUN 19 10/10/2017   CREATININE 1.16 (H) 10/10/2017   CALCIUM 9.5 10/10/2017   PHOS 5.4 (H) 01/28/2015   MG 2.0 01/28/2015   Lab  Results  Component Value Date   INR 1.18 01/25/2015   No results found for: CHOL, HDL, LDLCALC, TRIG   GEN- The patient is well appearing, alert and oriented x 3 today.   Head- normocephalic, atraumatic Eyes-  Sclera clear, conjunctiva pink Ears- hearing intact Oropharynx- clear Neck- supple, no JVP Lymph- no cervical lymphadenopathy Lungs- Clear to ausculation bilaterally, normal work of breathing Heart- Regular rate and rhythm, no murmurs, rubs or gallops, PMI not laterally displaced GI- soft, NT, ND, + BS Extremities- no clubbing, cyanosis, or edema MS- no significant deformity or atrophy Skin- no rash or lesion Psych- euthymic mood, full  affect Neuro- strength and sensation are intact  EKG-NSR at 66 bpm, pr int 178 ms, qrs int 94 ms, qtc 469 ms Epic records reviewed   Assessment and Plan: 1. Persistent afib  Maintaining SR  Pt has multiple cancelled appointments and had to limit refills to 30 days to come for renewal of flecainide and DOAC Asked pt to come in in 9 months so flecainide/doac  use can be safely supervised  Continue flecainide 50 mg bid  Continue bisoprolol 10 mg qd Continue xarelto 20 mg bid crcl cal at 88.17 ml/min( creatinine of 1.16 09/2017), appropriately dosed, chadsvasc score of at least 3 CBC in 09/2017 shows anemia, stable   2. OSA Continue compliance with CPAP  3. Morbid obesity Weight loss encouraged  4. HTN Stable  F/u in afib clinic in 9 months  Butch Penny C. Carroll, [facility] [address] [place], [place] [address] [phone]

## 2018-04-17 ENCOUNTER — Inpatient Hospital Stay (HOSPITAL_COMMUNITY): Payer: Medicare Other

## 2018-04-17 ENCOUNTER — Inpatient Hospital Stay (HOSPITAL_COMMUNITY): Payer: Medicare Other | Attending: Hematology

## 2018-04-17 ENCOUNTER — Encounter (HOSPITAL_COMMUNITY): Payer: Self-pay

## 2018-04-17 DIAGNOSIS — M8580 Other specified disorders of bone density and structure, unspecified site: Secondary | ICD-10-CM

## 2018-04-17 DIAGNOSIS — E538 Deficiency of other specified B group vitamins: Secondary | ICD-10-CM | POA: Diagnosis not present

## 2018-04-17 DIAGNOSIS — B379 Candidiasis, unspecified: Secondary | ICD-10-CM | POA: Diagnosis not present

## 2018-04-17 DIAGNOSIS — D649 Anemia, unspecified: Secondary | ICD-10-CM | POA: Diagnosis not present

## 2018-04-17 DIAGNOSIS — B369 Superficial mycosis, unspecified: Secondary | ICD-10-CM | POA: Insufficient documentation

## 2018-04-17 DIAGNOSIS — Z170 Estrogen receptor positive status [ER+]: Secondary | ICD-10-CM

## 2018-04-17 DIAGNOSIS — Z780 Asymptomatic menopausal state: Secondary | ICD-10-CM

## 2018-04-17 DIAGNOSIS — Z79899 Other long term (current) drug therapy: Secondary | ICD-10-CM

## 2018-04-17 DIAGNOSIS — C50212 Malignant neoplasm of upper-inner quadrant of left female breast: Secondary | ICD-10-CM | POA: Insufficient documentation

## 2018-04-17 LAB — CBC WITH DIFFERENTIAL/PLATELET
BASOS PCT: 1 %
Basophils Absolute: 0.1 10*3/uL (ref 0.0–0.1)
Eosinophils Absolute: 0.2 10*3/uL (ref 0.0–0.7)
Eosinophils Relative: 3 %
HEMATOCRIT: 34.2 % — AB (ref 36.0–46.0)
Hemoglobin: 10.6 g/dL — ABNORMAL LOW (ref 12.0–15.0)
Lymphocytes Relative: 18 %
Lymphs Abs: 1.3 10*3/uL (ref 0.7–4.0)
MCH: 29.3 pg (ref 26.0–34.0)
MCHC: 31 g/dL (ref 30.0–36.0)
MCV: 94.5 fL (ref 78.0–100.0)
MONO ABS: 0.5 10*3/uL (ref 0.1–1.0)
Monocytes Relative: 7 %
NEUTROS ABS: 5.2 10*3/uL (ref 1.7–7.7)
Neutrophils Relative %: 71 %
Platelets: 313 10*3/uL (ref 150–400)
RBC: 3.62 MIL/uL — ABNORMAL LOW (ref 3.87–5.11)
RDW: 14.9 % (ref 11.5–15.5)
WBC: 7.3 10*3/uL (ref 4.0–10.5)

## 2018-04-17 LAB — COMPREHENSIVE METABOLIC PANEL
ALBUMIN: 4 g/dL (ref 3.5–5.0)
ALT: 17 U/L (ref 0–44)
ANION GAP: 13 (ref 5–15)
AST: 18 U/L (ref 15–41)
Alkaline Phosphatase: 53 U/L (ref 38–126)
BILIRUBIN TOTAL: 0.3 mg/dL (ref 0.3–1.2)
BUN: 26 mg/dL — ABNORMAL HIGH (ref 8–23)
CO2: 30 mmol/L (ref 22–32)
Calcium: 9.4 mg/dL (ref 8.9–10.3)
Chloride: 97 mmol/L — ABNORMAL LOW (ref 98–111)
Creatinine, Ser: 1.5 mg/dL — ABNORMAL HIGH (ref 0.44–1.00)
GFR calc Af Amer: 40 mL/min — ABNORMAL LOW (ref >60)
GFR, EST NON AFRICAN AMERICAN: 34 mL/min — AB (ref >60)
Glucose, Bld: 125 mg/dL — ABNORMAL HIGH (ref 70–99)
POTASSIUM: 3.8 mmol/L (ref 3.5–5.1)
Sodium: 140 mmol/L (ref 135–145)
TOTAL PROTEIN: 7.8 g/dL (ref 6.5–8.1)

## 2018-04-17 MED ORDER — DENOSUMAB 60 MG/ML ~~LOC~~ SOSY
60.0000 mg | PREFILLED_SYRINGE | Freq: Once | SUBCUTANEOUS | Status: AC
  Administered 2018-04-17: 60 mg via SUBCUTANEOUS
  Filled 2018-04-17: qty 1

## 2018-04-17 NOTE — Patient Instructions (Signed)
[facility] at [facility] Discharge Instructions  Received Prolia injection today. Follow-up as scheduled. Call clinic for any questions or concerns   Thank you for choosing [facility] at [facility] to provide your oncology and hematology care.  To afford each patient quality time with our provider, please arrive at least 15 minutes before your scheduled appointment time.   If you have a lab appointment with the Cancer Center please come in thru the  Main Entrance and check in at the main information desk  You need to re-schedule your appointment should you arrive 10 or more minutes late.  We strive to give you quality time with our providers, and arriving late affects you and other patients whose appointments are after yours.  Also, if you no show three or more times for appointments you may be dismissed from the clinic at the providers discretion.     Again, thank you for choosing [facility].  Our hope is that these requests will decrease the amount of time that you wait before being seen by our physicians.       _____________________________________________________________  Should you have questions after your visit to [facility], please contact our office at [phone] between the hours of 8:30 a.m. and 4:30 p.m.  Voicemails left after 4:30 p.m. will not be returned until the following business day.  For prescription refill requests, have your pharmacy contact our office.       Resources For Cancer Patients and their Caregivers ? American Cancer Society: Can assist with transportation, wigs, general needs, runs Look Good Feel Better.        1-888-227-6333 ? Cancer Care: Provides financial assistance, online support groups, medication/co-pay assistance.  1-800-813-HOPE (4673) ? [facility] Assists [place] cancer patients and their families through emotional , educational and  financial support.  336-427-4357 ? [place] DSS Where to apply for food stamps, Medicaid and utility assistance. 336-342-1394 ? [facility]: Transportation to medical appointments. 336-347-2287 ? Social Security Administration: May apply for disability if have a Stage IV cancer. 336-342-7796 1-800-772-1213 ? [place] Aging, Disability and Transit Services: Assists with nutrition, care and transit needs. 336-349-2343  Cancer Center Support Programs:   > Cancer Support Group  2nd Tuesday of the month 1pm-2pm, Journey Room   > Creative Journey  3rd Tuesday of the month 1130am-1pm, Journey Room    

## 2018-04-17 NOTE — Progress Notes (Signed)
[name] tolerated Prolia injection well without complaints or incident. Calcium 9.4 today and pt denied any tooth or jaw pain and no recent or future dental visits. Pt continues to take her Calcium PO as pREscribed. VSS. Pt missed her last MD appt so another appt was made for her for next week. Pt discharged self ambulatory using walker in satisfactory condition

## 2018-04-24 ENCOUNTER — Other Ambulatory Visit: Payer: Self-pay

## 2018-04-24 ENCOUNTER — Ambulatory Visit (HOSPITAL_COMMUNITY): Payer: Medicare Other | Admitting: Internal Medicine

## 2018-04-24 ENCOUNTER — Inpatient Hospital Stay (HOSPITAL_BASED_OUTPATIENT_CLINIC_OR_DEPARTMENT_OTHER): Payer: Medicare Other | Admitting: Internal Medicine

## 2018-04-24 VITALS — BP 141/48 | HR 69 | Temp 98.7°F | Resp 20 | Wt 270.0 lb

## 2018-04-24 DIAGNOSIS — B379 Candidiasis, unspecified: Secondary | ICD-10-CM

## 2018-04-24 DIAGNOSIS — B369 Superficial mycosis, unspecified: Secondary | ICD-10-CM

## 2018-04-24 DIAGNOSIS — C50212 Malignant neoplasm of upper-inner quadrant of left female breast: Secondary | ICD-10-CM | POA: Diagnosis not present

## 2018-04-24 DIAGNOSIS — D649 Anemia, unspecified: Secondary | ICD-10-CM

## 2018-04-24 DIAGNOSIS — M8588 Other specified disorders of bone density and structure, other site: Secondary | ICD-10-CM | POA: Diagnosis not present

## 2018-04-24 DIAGNOSIS — Z170 Estrogen receptor positive status [ER+]: Secondary | ICD-10-CM

## 2018-04-24 DIAGNOSIS — E538 Deficiency of other specified B group vitamins: Secondary | ICD-10-CM | POA: Diagnosis not present

## 2018-04-24 MED ORDER — CYANOCOBALAMIN 1000 MCG/ML IJ SOLN: 1000.0000 ug | INTRAMUSCULAR | 0 refills | Status: DC

## 2018-04-24 MED ORDER — NYSTATIN 100000 UNIT/GM EX POWD: Freq: Two times a day (BID) | CUTANEOUS | 2 refills | Status: DC | PRN

## 2018-04-24 NOTE — Progress Notes (Signed)
Diagnosis Vitamin B12 deficiency - Plan: cyanocobalamin (,VITAMIN B-12,) 1000 MCG/ML injection  Malignant neoplasm of upper-inner quadrant of left breast in female, estrogen receptor positive ([place])  Yeast infection - Plan: nystatin [facility]) powder  Staging Cancer Staging No matching staging information was found for the patient.  Assessment and Plan: 1.  Stage IA left breast invasive ductal carcinoma; ER+/PR+/HER2-.  Pt was diagnosed in 11/2014; treated with (L) breast lumpectomy with Dr. Dalbert Batman, followed by adjuvant radiation therapy with Dr. Pablo Ledger. Completed radiation in 04/2015. Started anti-estrogen therapy with Anastrozole in 05/2015. Plan to continue anti-estrogen therapy for 5-10 years.    Pt had mammogram done at [facility] on 01/08/2018 that was negative.  She is recommended for repeat imaging in 12/2018.    Pt will be seen for follow-up in 09/2018 with labs.  She is advised to notify the office if she has any problems prior to her next visit.   2.  Osteopenia.  Last BMD was done 04/2015 and showed osteopenia.  She has been receiving Prolia every 6 months.  Pt will be set up for repeat Dexa and will be notified of results.  Continue Calcium and vitamin D.    3.  Fungal skin rash.  No evidence of rash noted on exam.  Pt is requesting refills on Nystatin Powder.  Refills given.    4.  Anemia.  Labs done 04/17/2018 reviewed with pt and showed WBC 7.3 HB 10.6 plts 313,000.  LFTS WNL.  Cr 1.5.  She is requesting refills on B12.  Rx sent.    5.  Atrial Flutter.  Pt on Xarelto.  Follow-up with PCP or cardiology as recommended.   Interval History:  Historical data obtained from note dated 10/10/2017.  67 yr old female with Stage IA left breast invasive ductal carcinoma; ER+/PR+/HER2-.  Pt was diagnosed in 11/2014; treated with (L) breast lumpectomy with Dr. Dalbert Batman, followed by adjuvant radiation therapy with Dr. Pablo Ledger. Completed radiation in 04/2015. Started anti-estrogen therapy with Anastrozole  in 05/2015. Plan to continue anti-estrogen therapy for 5-10 years.   Current Status:  Pt is seen today for follow-up prior to Prolia.  She is requesting refills on Nystatin powder as well as B12.      Breast cancer of upper-inner quadrant of left female breast ([place])   12/15/2014 Mammogram    new 59m round nodule in left breast is indeterminate      12/17/2014 Imaging    6 mm lobulated nodule in the left breast is at low suspicion for malignancy. ultrasound guided biopsy recommended      12/23/2014 Procedure    [facility].  ER+ (100%), PR+ (99%), HER2 neg (ratio 1.61). Ki67 45%.       01/26/2015 Surgery    (L) lumpectomy with SLNB (Dalbert Batman: IDC, grade 2, spanning 1.1 cm; also low-grade DCIS; margins negative. 0/3 left axillary SLN. HER2 repeated and remains neg. (ratio 1.33).  pT1c, pN0: Stage IA      03/19/2015 Genetic Testing    Genetic testing: Negative. Genes analyzed: ATM, BARD1, BRCA1, BRCA2, BRIP1, CDH1, CHEK2, FANCC, MLH1, MSH2, MSH6, NBN, PALB2, PMS2, PTEN, RAD51C, RAD51D, STK11, TP53, and XRCC2.  Additionally, this panel includes deletion/duplication analysis (without next-generation sequencing) of one gene, EPCAM.      03/2015 - 04/2015 Radiation Therapy    Adjuvant breast radiation with Dr. WPablo Ledger       04/30/2015 Imaging    DEXA scan: Osteopenia. (T-Score of -1.1)  05/2015 -  Anti-estrogen oral therapy    Anastrozole. Planned duration of therapy 5-10 years.       07/16/2015 Miscellaneous    Prolia injections started.         Problem List Patient Active Problem List   Diagnosis Date Noted  . Osteopenia determined by x-ray [M85.80] 07/02/2015  . High risk medication use [Z79.899] 07/02/2015  . Breast cancer of upper-inner quadrant of left female breast ([place]) [C50.212] 01/26/2015  . Shortness of breath [R06.02] 04/09/2014  . DM2 (diabetes mellitus, type 2) ([place]) [E11.9] 02/20/2014  . Atrial fibrillation ([place])  [I48.91] 02/19/2014  . Health care maintenance [Z00.00] 09/26/2013  . Hypertension [I10] 10/09/2012  . Obstructive sleep apnea [G47.33] 01/16/2012  . Atrial flutter ([place]) [I48.92]   . Long term (current) use of anticoagulants [Z79.01] 11/10/2011  . Personal history of colonic polyps [Z86.010] 06/14/2010  . Morbid (severe) obesity due to excess calories ([place]) [E66.01] 07/01/2008  . Cough variant asthma with prominent upper airway features  [J45.991] 10/03/2007  . GERD [K21.9] 10/03/2007    Past Medical History Past Medical History:  Diagnosis Date  . Acute renal failure ([place]) 01/27/2015  . Acute respiratory failure ([place]) 01/27/2015  . Adenomatous colon polyp 1993  . Allergy   . Anemia   . Anxiety   . Anxiety disorder   . Asthma    Spirometry 1989: FEV1 2.12 (71%) with ratio 82.  HFA 90% after coaching 11-09-10  . Atrial flutter ([place]) chadsvasc of 3 (11/19/14)   Status post cardioversion 2-12 2013. s/p RFCA 5/13 with JAllred (coumadin and amio stopped)   . Breast cancer ([place])    left  . COPD (chronic obstructive pulmonary disease) ([place])   . DDD (degenerative disc disease)   . Diverticulosis   . Essential hypertension, benign   . Fibromyalgia   . GERD (gastroesophageal reflux disease)    EGD with HH / esophagitis 06-30-93  . Hiatal hernia   . History of atrial fibrillation   . Hyperlipidemia   . Internal and external hemorrhoids without complication   . Lipoma of colon   . Migraine   . Osteoarthritis   . Pelvic fracture ([place])   . Personal history of colonic polyps 06/14/2010  . Pneumonia   . PONV (postoperative nausea and vomiting)   . Radiation    left breast cancer  . Sleep apnea    cpap    Past Surgical History Past Surgical History:  Procedure Laterality Date  . A FLUTTER ABLATION  02/22/2012  . ATRIAL FLUTTER ABLATION N/A 02/22/2012   Procedure: ATRIAL FLUTTER ABLATION;  Surgeon: Thompson Grayer, MD;  [facility] C[facility] LAB;  Service: Cardiovascular;  Laterality: N/A;  .  BREAST BIOPSY Left 11/2014  . CARDIOVERSION N/A 02/19/2014   Procedure: CARDIOVERSION;  Surgeon: Dorothy Spark, MD;  Location: [place];  Service: Cardiovascular;  Laterality: N/A;  . COLONOSCOPY  05/2010, 07/2000  . DILATION AND CURETTAGE OF UTERUS    . ESOPHAGOGASTRODUODENOSCOPY  05/2010, 06/1993  . JOINT REPLACEMENT    . POLYPECTOMY    . RADIOACTIVE SEED GUIDED PARTIAL MASTECTOMY WITH AXILLARY SENTINEL LYMPH NODE BIOPSY Left 01/26/2015   Procedure: LEFT  PARTIAL MASTECTOMY WITH  RADIOACTIVE SEED LOCALIZATION, LEFT AXILLARY SENTINEL LYMPH NODE BIOPSY;  Surgeon: Fanny Skates, MD;  Location: [place];  Service: General;  Laterality: Left;  . ROTATOR CUFF REPAIR     bilateral  . ROTATOR CUFF REPAIR     bil  . TOTAL KNEE ARTHROPLASTY  x2 09/2006  . UPPER GASTROINTESTINAL ENDOSCOPY      Family History Family History  Problem Relation Age of Onset  . Emphysema Father        smoker  . Heart disease Father   . Breast cancer Mother 94  . Breast cancer Sister 58  . Heart Problems Sister   . Heart disease Sister   . Breast cancer Maternal Aunt 48  . Lung cancer Maternal Uncle 83       smoker  . Colon cancer Paternal Uncle 11  . Diabetes Maternal Grandmother   . Heart Problems Maternal Grandfather   . Stroke Paternal Grandfather   . Lung cancer Maternal Uncle 72       smoker  . Heart Problems Paternal Uncle   . Diabetes Paternal Grandmother   . Rectal cancer Neg Hx   . Stomach cancer Neg Hx   . Esophageal cancer Neg Hx      Social History  reports that she quit smoking about 33 years ago. Her smoking use included cigarettes. She has a 20.00 pack-year smoking history. She has never used smokeless tobacco. She reports that she does not drink alcohol or use drugs.  Medications  Current Outpatient Medications:  .  ALPRAZolam (XANAX) 0.5 MG tablet, Take 0.5 mg by mouth 2 (two) times daily as needed for anxiety. , Disp: , Rfl: 0 .  anastrozole (ARIMIDEX) 1 MG tablet, Take 1 tablet (1  mg total) by mouth daily., Disp: 30 tablet, Rfl: 5 .  atorvastatin (LIPITOR) 10 MG tablet, Take 10 mg by mouth daily., Disp: , Rfl: 0 .  bisoprolol (ZEBETA) 10 MG tablet, Take 1 tablet (10 mg total) by mouth daily., Disp: 30 tablet, Rfl: 2 .  calcium-vitamin D (OSCAL WITH D) 500-200 MG-UNIT per tablet, Take 1 tablet by mouth daily with breakfast. (Patient taking differently: Take 2 tablets by mouth daily with breakfast. ), Disp: 30 tablet, Rfl: 3 .  CARTIA XT 180 MG 24 hr capsule, take 1 capsule by mouth once daily, Disp: 90 capsule, Rfl: 3 .  celecoxib (CELEBREX) 200 MG capsule, Take 1 capsule by mouth every 3-4 days, Disp: , Rfl: 0 .  cyanocobalamin (,VITAMIN B-12,) 1000 MCG/ML injection, Inject 1 mL (1,000 mcg total) into the muscle every 30 (thirty) days., Disp: 10 mL, Rfl: 0 .  flecainide (TAMBOCOR) 50 MG tablet, Take 1 tablet (50 mg total) by mouth 2 (two) times daily., Disp: 180 tablet, Rfl: 2 .  furosemide (LASIX) 40 MG tablet, Take 40 mg by mouth 3 (three) times a week. , Disp: , Rfl: 0 .  GARCINIA CAMBOGIA-CHROMIUM PO, Take by mouth., Disp: , Rfl:  .  hydrOXYzine (ATARAX/VISTARIL) 25 MG tablet, Take 1 tablet by mouth every 6 (six) hours as needed. Itching, Disp: , Rfl: 0 .  losartan (COZAAR) 100 MG tablet, Take 100 mg by mouth daily. , Disp: , Rfl: 1 .  metFORMIN (GLUCOPHAGE) 500 MG tablet, 1 tablet 2 (two) times daily with a meal. , Disp: , Rfl:  .  nystatin (NYAMYC) powder, Apply topically 2 (two) times daily as needed., Disp: 15 g, Rfl: 2 .  omeprazole (PRILOSEC) 40 MG capsule, take 1 capsule by mouth once daily BEFORE BREAKFAST, Disp: 90 capsule, Rfl: 1 .  PARoxetine (PAXIL) 20 MG tablet, Take 20 mg by mouth daily., Disp: , Rfl:  .  potassium chloride (K-DUR) 10 MEQ tablet, Take 1 tablet by mouth daily., Disp: , Rfl: 0 .  PROAIR RESPICLICK 767 (90 Base) MCG/ACT  AEPB, INHALE 2 PUFFS BY MOUTH EVERY 4 HOURS IF NEEDED, Disp: 1 each, Rfl: 0 .  rivaroxaban (XARELTO) 20 MG TABS tablet, TAKE  1 TABLET BY MOUTH WITH SUPPER, Disp: 90 tablet, Rfl: 2 .  SYMBICORT 80-4.5 MCG/ACT inhaler, inhale 2 puffs by mouth twice a day Rinse mouth after use, Disp: 30.6 g, Rfl: 0 .  cyclobenzaprine (FLEXERIL) 10 MG tablet, Take 1 tablet by mouth 3 (three) times daily. As needed, Disp: , Rfl: 0 .  fluticasone (FLONASE) 50 MCG/ACT nasal spray, Place 2 sprays into both nostrils daily as needed for allergies. , Disp: , Rfl: 0 No current facility-administered medications for this visit.   Facility-Administered Medications Ordered in Other Visits:  .  0.9 %  sodium chloride infusion, , Intravenous, Continuous, [place], Kelby Fam, MD  Allergies Shellfish allergy; Ace inhibitors; Aminophylline; Epinephrine; Theophyllines; and Propoxyphene hcl  Review of Systems Review of Systems - Oncology ROS negative other than fatigue   Physical Exam  Vitals Wt Readings from Last 3 Encounters:  04/24/18 270 lb (122.5 kg)  04/16/18 269 lb (122 kg)  11/08/17 267 lb (121.1 kg)   Temp Readings from Last 3 Encounters:  04/24/18 98.7 F (37.1 C) (Oral)  04/17/18 98.2 F (36.8 C) (Oral)  11/08/17 97.9 F (36.6 C) (Oral)   BP Readings from Last 3 Encounters:  04/24/18 (!) 141/48  04/17/18 (!) 142/81  04/16/18 112/64   Pulse Readings from Last 3 Encounters:  04/24/18 69  04/17/18 60  04/16/18 66   Constitutional: Well-developed, well-nourished, and in no distress.   HENT: Head: Normocephalic and atraumatic.  Mouth/Throat: No oropharyngeal exudate. Mucosa moist. Eyes: Pupils are equal, round, and reactive to light. Conjunctivae are normal. No scleral icterus.  Neck: Normal range of motion. Neck supple. No JVD present.  Cardiovascular: Normal rate, regular rhythm and normal heart sounds.  Exam reveals no gallop and no friction rub.   No murmur heard. Pulmonary/Chest: Effort normal and breath sounds normal. No respiratory distress. No wheezes.No rales.  Abdominal: Soft. Bowel sounds are normal. No  distension. There is no tenderness. There is no guarding.  Musculoskeletal: No edema or tenderness.  Lymphadenopathy: No cervical, axillary or supraclavicular adenopathy.  Neurological: Alert and oriented to person, place, and time. No cranial nerve deficit.  Skin: Skin is warm and dry. No rash noted. No erythema. No pallor.  Psychiatric: Affect and judgment normal.  Bilateral breast exam:  Chaperone present.  Left lumpectomy and RT changes noted.  No dominant masses palpable bilaterally.    Labs No visits with results within 3 Day(s) from this visit.  Latest known visit with results is:  Appointment on 04/17/2018  Component Date Value Ref Range Status  . WBC 04/17/2018 7.3  4.0 - 10.5 K/uL Final  . RBC 04/17/2018 3.62* 3.87 - 5.11 MIL/uL Final  . Hemoglobin 04/17/2018 10.6* 12.0 - 15.0 g/dL Final  . HCT 04/17/2018 34.2* 36.0 - 46.0 % Final  . MCV 04/17/2018 94.5  78.0 - 100.0 fL Final  . MCH 04/17/2018 29.3  26.0 - 34.0 pg Final  . MCHC 04/17/2018 31.0  30.0 - 36.0 g/dL Final  . RDW 04/17/2018 14.9  11.5 - 15.5 % Final  . Platelets 04/17/2018 313  150 - 400 K/uL Final  . Neutrophils Relative % 04/17/2018 71  % Final  . Neutro Abs 04/17/2018 5.2  1.7 - 7.7 K/uL Final  . Lymphocytes Relative 04/17/2018 18  % Final  . Lymphs Abs 04/17/2018 1.3  0.7 - 4.0 K/uL  Final  . Monocytes Relative 04/17/2018 7  % Final  . Monocytes Absolute 04/17/2018 0.5  0.1 - 1.0 K/uL Final  . Eosinophils Relative 04/17/2018 3  % Final  . Eosinophils Absolute 04/17/2018 0.2  0.0 - 0.7 K/uL Final  . Basophils Relative 04/17/2018 1  % Final  . Basophils Absolute 04/17/2018 0.1  0.0 - 0.1 K/uL Final   Performed at [facility], [address], [place], [place] [address]  . Sodium 04/17/2018 140  135 - 145 mmol/L Final  . Potassium 04/17/2018 3.8  3.5 - 5.1 mmol/L Final  . Chloride 04/17/2018 97* 98 - 111 mmol/L Final  . CO2 04/17/2018 30  22 - 32 mmol/L Final  . Glucose, Bld 04/17/2018 125* 70 - 99 mg/dL Final   . BUN 04/17/2018 26* 8 - 23 mg/dL Final  . Creatinine, Ser 04/17/2018 1.50* 0.44 - 1.00 mg/dL Final  . Calcium 04/17/2018 9.4  8.9 - 10.3 mg/dL Final  . Total Protein 04/17/2018 7.8  6.5 - 8.1 g/dL Final  . Albumin 04/17/2018 4.0  3.5 - 5.0 g/dL Final  . AST 04/17/2018 18  15 - 41 U/L Final  . ALT 04/17/2018 17  0 - 44 U/L Final  . Alkaline Phosphatase 04/17/2018 53  38 - 126 U/L Final  . Total Bilirubin 04/17/2018 0.3  0.3 - 1.2 mg/dL Final  . GFR calc non Af Amer 04/17/2018 34* >60 mL/min Final  . GFR calc Af Amer 04/17/2018 40* >60 mL/min Final   Comment: (NOTE) The eGFR has been calculated using the CKD EPI equation. This calculation has not been validated in all clinical situations. eGFR's persistently <60 mL/min signify possible Chronic Kidney Disease.   Georgiann Hahn gap 04/17/2018 13  5 - 15 Final   Performed at [facility], [address], [place],  [address]     Pathology No orders of the defined types were placed in this encounter.      Zoila Shutter MD

## 2018-04-24 NOTE — Patient Instructions (Addendum)
[place] at [facility] Discharge Instructions  Today you saw Dr. Walden Field  Your mammogram was normal from 2018.  Your blood work was reviewed in the office visit today.  Mammogram due in April 2020.   Prolia, labs and MD visit in January.  We will refer you to a pain management doctor.  Thank you for choosing [place] at [facility] to provide your oncology and hematology care.  To afford each patient quality time with our provider, please arrive at least 15 minutes before your scheduled appointment time.   If you have a lab appointment with the [facility] please come in thru the  Main Entrance and check in at the main information desk  You need to re-schedule your appointment should you arrive 10 or more minutes late.  We strive to give you quality time with our providers, and arriving late affects you and other patients whose appointments are after yours.  Also, if you no show three or more times for appointments you may be dismissed from the clinic at the providers discretion.     Again, thank you for choosing [facility].  Our hope is that these requests will decrease the amount of time that you wait before being seen by our physicians.       _____________________________________________________________  Should you have questions after your visit to [facility], please contact our office at [phone] between the hours of 8:00 a.m. and 4:30 p.m.  Voicemails left after 4:00 p.m. will not be returned until the following business day.  For prescription refill requests, have your pharmacy contact our office and allow 72 hours.    Cancer Center Support Programs:   > Cancer Support Group  2nd Tuesday of the month 1pm-2pm, Journey Room

## 2018-04-29 ENCOUNTER — Other Ambulatory Visit (HOSPITAL_COMMUNITY): Payer: Medicare Other

## 2018-04-29 ENCOUNTER — Ambulatory Visit (HOSPITAL_COMMUNITY): Payer: Medicare Other

## 2018-05-08 ENCOUNTER — Ambulatory Visit (HOSPITAL_COMMUNITY)
Admission: RE | Admit: 2018-05-08 | Discharge: 2018-05-08 | Disposition: A | Discharge status: Home / Self Care | Payer: Medicare Other | Source: Ambulatory Visit | Attending: Internal Medicine | Admitting: Internal Medicine

## 2018-05-08 DIAGNOSIS — M8588 Other specified disorders of bone density and structure, other site: Secondary | ICD-10-CM | POA: Diagnosis not present

## 2018-05-24 ENCOUNTER — Other Ambulatory Visit (HOSPITAL_COMMUNITY): Payer: Self-pay | Admitting: Internal Medicine

## 2018-05-24 ENCOUNTER — Other Ambulatory Visit (HOSPITAL_COMMUNITY): Payer: Self-pay | Admitting: Nurse Practitioner

## 2018-05-24 ENCOUNTER — Other Ambulatory Visit: Payer: Self-pay | Admitting: Pulmonary Disease

## 2018-05-24 DIAGNOSIS — I480 Paroxysmal atrial fibrillation: Secondary | ICD-10-CM

## 2018-05-24 DIAGNOSIS — B379 Candidiasis, unspecified: Secondary | ICD-10-CM

## 2018-05-28 ENCOUNTER — Other Ambulatory Visit (HOSPITAL_COMMUNITY): Payer: Self-pay | Admitting: *Deleted

## 2018-06-04 ENCOUNTER — Other Ambulatory Visit (HOSPITAL_COMMUNITY): Payer: Self-pay | Admitting: *Deleted

## 2018-06-04 DIAGNOSIS — C50212 Malignant neoplasm of upper-inner quadrant of left female breast: Secondary | ICD-10-CM

## 2018-06-04 DIAGNOSIS — Z170 Estrogen receptor positive status [ER+]: Principal | ICD-10-CM

## 2018-06-04 MED ORDER — ANASTROZOLE 1 MG PO TABS: 1.0000 mg | ORAL_TABLET | Freq: Every day | ORAL | 5 refills | Status: DC

## 2018-06-10 ENCOUNTER — Other Ambulatory Visit (HOSPITAL_COMMUNITY): Payer: Self-pay | Admitting: *Deleted

## 2018-06-10 DIAGNOSIS — B379 Candidiasis, unspecified: Secondary | ICD-10-CM

## 2018-06-10 MED ORDER — NYSTATIN 100000 UNIT/GM EX POWD: Freq: Two times a day (BID) | CUTANEOUS | 2 refills | Status: DC | PRN

## 2018-06-12 ENCOUNTER — Other Ambulatory Visit (HOSPITAL_COMMUNITY): Payer: Self-pay | Admitting: *Deleted

## 2018-06-24 ENCOUNTER — Other Ambulatory Visit: Payer: Self-pay

## 2018-06-24 DIAGNOSIS — K219 Gastro-esophageal reflux disease without esophagitis: Secondary | ICD-10-CM

## 2018-06-24 MED ORDER — OMEPRAZOLE 40 MG PO CPDR: DELAYED_RELEASE_CAPSULE | ORAL | 0 refills | Status: AC

## 2018-07-31 ENCOUNTER — Other Ambulatory Visit (HOSPITAL_COMMUNITY): Payer: Self-pay | Admitting: Nurse Practitioner

## 2018-07-31 DIAGNOSIS — I480 Paroxysmal atrial fibrillation: Secondary | ICD-10-CM

## 2018-08-06 ENCOUNTER — Other Ambulatory Visit (HOSPITAL_COMMUNITY): Payer: Self-pay | Admitting: *Deleted

## 2018-08-06 ENCOUNTER — Telehealth (HOSPITAL_COMMUNITY): Payer: Self-pay | Admitting: Surgery

## 2018-08-06 MED ORDER — TEMAZEPAM 30 MG PO CAPS: 30.0000 mg | ORAL_CAPSULE | Freq: Every evening | ORAL | 0 refills | Status: DC | PRN

## 2018-08-06 NOTE — Telephone Encounter (Signed)
Per Dr. Walden Field, notified pt of results of her bone density scan.  The scan showed osteopenia but an increase in overall bone density since her last scan.  Pt was instructed to continue taking her calcium and Vit D.  Pt verbalized understanding and was told to call if she had any concerns.

## 2018-09-06 ENCOUNTER — Ambulatory Visit: Payer: Medicare Other | Admitting: Adult Health

## 2018-09-09 ENCOUNTER — Ambulatory Visit (INDEPENDENT_AMBULATORY_CARE_PROVIDER_SITE_OTHER)
Admission: RE | Admit: 2018-09-09 | Discharge: 2018-09-09 | Disposition: A | Discharge status: Home / Self Care | Payer: Medicare Other | Source: Ambulatory Visit | Attending: Adult Health | Admitting: Adult Health

## 2018-09-09 ENCOUNTER — Encounter: Payer: Self-pay | Admitting: Adult Health

## 2018-09-09 ENCOUNTER — Ambulatory Visit (INDEPENDENT_AMBULATORY_CARE_PROVIDER_SITE_OTHER): Payer: Medicare Other | Admitting: Adult Health

## 2018-09-09 VITALS — BP 122/88 | HR 122 | Temp 97.8°F | Ht 66.0 in | Wt 267.0 lb

## 2018-09-09 DIAGNOSIS — J45991 Cough variant asthma: Secondary | ICD-10-CM

## 2018-09-09 MED ORDER — PREDNISONE 10 MG PO TABS: ORAL_TABLET | ORAL | 0 refills | Status: DC

## 2018-09-09 MED ORDER — DOXYCYCLINE HYCLATE 100 MG PO TABS: 100.0000 mg | ORAL_TABLET | Freq: Two times a day (BID) | ORAL | 0 refills | Status: DC

## 2018-09-09 MED ORDER — LEVALBUTEROL HCL 0.63 MG/3ML IN NEBU
0.6300 mg | INHALATION_SOLUTION | Freq: Once | RESPIRATORY_TRACT | Status: AC
  Administered 2018-09-09: 0.63 mg via RESPIRATORY_TRACT

## 2018-09-09 MED ORDER — BUDESONIDE-FORMOTEROL FUMARATE 160-4.5 MCG/ACT IN AERO: 2.0000 | INHALATION_SPRAY | Freq: Two times a day (BID) | RESPIRATORY_TRACT | 0 refills | Status: DC

## 2018-09-09 MED ORDER — BENZONATATE 200 MG PO CAPS: 200.0000 mg | ORAL_CAPSULE | Freq: Three times a day (TID) | ORAL | 1 refills | Status: DC | PRN

## 2018-09-09 NOTE — Progress Notes (Signed)
@Patient  ID: [name], female    DOB: 01-27-1951, 67 y.o.   MRN: [id number]  Chief Complaint  Patient presents with  . Acute Visit    Cough     Referring provider: [name], MD  HPI: 67 year old female former smoker (1996 followed for asthma, obstructive sleep apnea and GERD. Medical history significant for A. fib on flecainide and Xarelto, chronic pain syndrome, breast cancer  , DM   TEST/EVENTS :  2013 AHI of 15 events per hour.  09/09/2018 Acute OV : Cough  Patient presents for an acute office visit. Complains of Cough for 2 weeks with associated low grade fevers, thick mucus , and sinus drainage. Seen by PCP initially given amoxicillin without much help . Continues to cough up thick yellow mucus. Cough is keeping her up at night .  Has intermittent wheezing.  Remains on Symbicort Twice daily  . With no missed doses .   Has DM says under excellent control .     Allergies  Allergen Reactions  . Shellfish Allergy Hives and Shortness Of Breath  . Ace Inhibitors Cough  . Aminophylline Nausea And Vomiting    Heart races and pounds  . Epinephrine Other (See Comments)    Pallpitations during dental procedures  . Theophyllines Nausea And Vomiting and Other (See Comments)    Heart races and pounds  . Propoxyphene Hcl Nausea And Vomiting    Immunization History  Administered Date(s) Administered  . Influenza Split 06/25/2013, 04/25/2014  . Influenza Whole 05/26/2009, 06/25/2012  . Influenza-Unspecified 05/10/2015, 04/25/2016  . Pneumococcal Polysaccharide-23 02/22/2012  . Zoster 04/25/2013    Past Medical History:  Diagnosis Date  . Acute renal failure ([place]) 01/27/2015  . Acute respiratory failure ([place]) 01/27/2015  . Adenomatous colon polyp 1993  . Allergy   . Anemia   . Anxiety   . Anxiety disorder   . Asthma    Spirometry 1989: FEV1 2.12 (71%) with ratio 82.  HFA 90% after coaching 11-09-10  . Atrial flutter ([place]) chadsvasc of 3 (11/19/14)   Status post cardioversion 2-12 2013. s/p RFCA 5/13 with JAllred (coumadin and amio stopped)   . Breast cancer ([place])    left  . COPD (chronic obstructive pulmonary disease) ([place])   . DDD (degenerative disc disease)   . Diverticulosis   . Essential hypertension, benign   . Fibromyalgia   . GERD (gastroesophageal reflux disease)    EGD with HH / esophagitis 06-30-93  . Hiatal hernia   . History of atrial fibrillation   . Hyperlipidemia   . Internal and external hemorrhoids without complication   . Lipoma of colon   . Migraine   . Osteoarthritis   . Pelvic fracture ([place])   . Personal history of colonic polyps 06/14/2010  . Pneumonia   . PONV (postoperative nausea and vomiting)   . Radiation    left breast cancer  . Sleep apnea    cpap    Tobacco History: Social History   Tobacco Use  Smoking Status Former Smoker  . Packs/day: 1.00  . Years: 20.00  . Pack years: 20.00  . Types: Cigarettes  . Last attempt to quit: 09/25/1984  . Years since quitting: 33.9  Smokeless Tobacco Never Used   Counseling given: Not Answered   Outpatient Medications Prior to Visit  Medication Sig Dispense Refill  . ALPRAZolam (XANAX) 0.5 MG tablet Take 0.5 mg by mouth 2 (two) times daily as needed for anxiety.   0  . anastrozole (  ARIMIDEX) 1 MG tablet Take 1 tablet (1 mg total) by mouth daily. 30 tablet 5  . atorvastatin (LIPITOR) 10 MG tablet Take 10 mg by mouth daily.  0  . bisoprolol (ZEBETA) 10 MG tablet Take 1 tablet (10 mg total) by mouth daily. 30 tablet 2  . calcium-vitamin D (OSCAL WITH D) 500-200 MG-UNIT per tablet Take 1 tablet by mouth daily with breakfast. (Patient taking differently: Take 2 tablets by mouth daily with breakfast. ) 30 tablet 3  . CARTIA XT 180 MG 24 hr capsule take 1 capsule by mouth once daily 90 capsule 3  . celecoxib (CELEBREX) 200 MG capsule Take 1 capsule by mouth every 3-4 days  0  . cyanocobalamin (,VITAMIN B-12,) 1000 MCG/ML injection Inject 1 mL (1,000 mcg  total) into the muscle every 30 (thirty) days. 10 mL 0  . cyclobenzaprine (FLEXERIL) 10 MG tablet Take 1 tablet by mouth 3 (three) times daily. As needed  0  . flecainide (TAMBOCOR) 50 MG tablet TAKE 1 TABLET BY MOUTH TWICE DAILY 180 tablet 1  . fluticasone (FLONASE) 50 MCG/ACT nasal spray Place 2 sprays into both nostrils daily as needed for allergies.   0  . furosemide (LASIX) 40 MG tablet Take 40 mg by mouth 3 (three) times a week.   0  . GARCINIA CAMBOGIA-CHROMIUM PO Take by mouth.    . hydrOXYzine (ATARAX/VISTARIL) 25 MG tablet Take 1 tablet by mouth every 6 (six) hours as needed. Itching  0  . losartan (COZAAR) 100 MG tablet Take 100 mg by mouth daily.   1  . metFORMIN (GLUCOPHAGE) 500 MG tablet 1 tablet 2 (two) times daily with a meal.     . nystatin (NYAMYC) powder Apply topically 2 (two) times daily as needed. 60 g 2  . omeprazole (PRILOSEC) 40 MG capsule take 1 capsule by mouth once daily BEFORE BREAKFAST 90 capsule 0  . PARoxetine (PAXIL) 20 MG tablet Take 20 mg by mouth daily.    . potassium chloride (K-DUR) 10 MEQ tablet Take 1 tablet by mouth daily.  0  . PROAIR RESPICLICK 387 (90 Base) MCG/ACT AEPB INHALE 2 PUFFS BY MOUTH EVERY 4 HOURS IF NEEDED 1 each 0  . rivaroxaban (XARELTO) 20 MG TABS tablet TAKE 1 TABLET BY MOUTH WITH SUPPER 90 tablet 2  . SYMBICORT 80-4.5 MCG/ACT inhaler inhale 2 puffs by mouth twice a day Rinse mouth after use 30.6 g 0  . temazepam (RESTORIL) 30 MG capsule Take 1 capsule (30 mg total) by mouth at bedtime as needed for sleep. 30 capsule 0  . XARELTO 20 MG TABS tablet TAKE 1 TABLET BY MOUTH EVERY DAY WITH SUPPER 30 tablet 6   Facility-Administered Medications Prior to Visit  Medication Dose Route Frequency Provider Last Rate Last Dose  . 0.9 %  sodium chloride infusion   Intravenous Continuous Penland, Kelby Fam, MD         Review of Systems  Constitutional:   No  weight loss, night sweats,  Fevers, chills, fatigue, or  lassitude.  HEENT:   No  headaches,  Difficulty swallowing,  Tooth/dental problems, or  Sore throat,                No sneezing, itching, ear ache,  +nasal congestion, post nasal drip,   CV:  No chest pain,  Orthopnea, PND, swelling in lower extremities, anasarca, dizziness, palpitations, syncope.   GI  No heartburn, indigestion, abdominal pain, nausea, vomiting, diarrhea, change in bowel habits, loss of  appetite, bloody stools.   Resp:    No chest wall deformity  Skin: no rash or lesions.  GU: no dysuria, change in color of urine, no urgency or frequency.  No flank pain, no hematuria   MS:  No joint pain or swelling.  No decreased range of motion.  No back pain.    Physical Exam  BP 122/88 (BP Location: Left Arm, Cuff Size: Large)   Pulse (!) 122   Temp 97.8 F (36.6 C) (Oral)   Ht 5\' 6"  (1.676 m)   Wt 267 lb (121.1 kg)   SpO2 96%   BMI 43.09 kg/m   GEN: A/Ox3; pleasant , NAD, obese    HEENT:  [place]/AT,  EACs-clear, TMs-wnl, NOSE-clear, THROAT-clear, no lesions, no postnasal drip or exudate noted. Class 2-3 MP airway   NECK:  Supple w/ fair ROM; no JVD; normal carotid impulses w/o bruits; no thyromegaly or nodules palpated; no lymphadenopathy.    RESP  Clear  P & A; w/o, wheezes/ rales/ or rhonchi. no accessory muscle use, no dullness to percussion  CARD:  RRR, no m/r/g, tr  peripheral edema, pulses intact, no cyanosis or clubbing.  GI:   Soft & nt; nml bowel sounds; no organomegaly or masses detected.   Musco: Warm bil, no deformities or joint swelling noted.   Neuro: alert, no focal deficits noted.    Skin: Warm, no lesions or rashes    Lab Results:  CBC  No results found for: PROBNP  Imaging: Dg Chest 2 View  Result Date: 09/09/2018 CLINICAL DATA:  Cough and congestion.  Shortness of breath. EXAM: CHEST - 2 VIEW COMPARISON:  October 02, 2016 FINDINGS: Cardiomegaly. The hila and mediastinum are unchanged. No pulmonary nodules, masses, or focal infiltrates. No overt edema.  IMPRESSION: No active cardiopulmonary disease. Electronically Signed   By: Dorise Bullion III M.D   On: 09/09/2018 13:13    levalbuterol Penne Lash) nebulizer solution 0.63 mg    Date Action Dose Route User   09/09/2018 1257 Given 0.63 mg Nebulization Parke Poisson E, CMA      No flowsheet data found.  Lab Results  Component Value Date   NITRICOXIDE 9 02/01/2016        Assessment & Plan:   Cough variant asthma with prominent upper airway features  Flare with bronchitis- slow to resolve Check CXR today  xopenex neb x 1 today   Plan  Patient Instructions  Begin Doxycycline 100mg  Twice daily for 1 week, take with food.  Mucinex DM Twice daily  As needed  Cough/congestion  Tessalon Three times a day  As needed  Cough  Increase Symbicort 160 2 puffs Twice daily  , (in place of Symbicort 80 ) , finish sample then resume Symbicort 80 2 puffs Twice daily   Prednisone taper over next week. , take with food.  Saline nasal rinses As needed   Albuterol As needed  Wheezing .  Chest xray today  Follow up with Dr. Elsworth Soho Or Mohamud Mrozek NP  In 2 weeks and As needed   Please contact office for sooner follow up if symptoms do not improve or worsen or seek emergency care         Rexene Edison, NP 09/09/2018

## 2018-09-09 NOTE — Patient Instructions (Addendum)
Begin Doxycycline 100mg  Twice daily for 1 week, take with food.  Mucinex DM Twice daily  As needed  Cough/congestion  Tessalon Three times a day  As needed  Cough  Increase Symbicort 160 2 puffs Twice daily  , (in place of Symbicort 80 ) , finish sample then resume Symbicort 80 2 puffs Twice daily   Prednisone taper over next week. , take with food.  Saline nasal rinses As needed   Albuterol As needed  Wheezing .  Chest xray today  Follow up with Dr. Elsworth Soho Or Parrett NP  In 2 weeks and As needed   Please contact office for sooner follow up if symptoms do not improve or worsen or seek emergency care

## 2018-09-09 NOTE — Assessment & Plan Note (Signed)
Flare with bronchitis- slow to resolve Check CXR today  xopenex neb x 1 today   Plan  Patient Instructions  Begin Doxycycline 100mg  Twice daily for 1 week, take with food.  Mucinex DM Twice daily  As needed  Cough/congestion  Tessalon Three times a day  As needed  Cough  Increase Symbicort 160 2 puffs Twice daily  , (in place of Symbicort 80 ) , finish sample then resume Symbicort 80 2 puffs Twice daily   Prednisone taper over next week. , take with food.  Saline nasal rinses As needed   Albuterol As needed  Wheezing .  Chest xray today  Follow up with Dr. Elsworth Soho Or Parrett NP  In 2 weeks and As needed   Please contact office for sooner follow up if symptoms do not improve or worsen or seek emergency care

## 2018-09-17 NOTE — Progress Notes (Signed)
lmtcb

## 2018-09-23 ENCOUNTER — Ambulatory Visit: Payer: Medicare Other | Admitting: Adult Health

## 2018-09-26 ENCOUNTER — Telehealth: Payer: Self-pay | Admitting: Pulmonary Disease

## 2018-09-26 NOTE — Telephone Encounter (Signed)
LMTCB

## 2018-09-26 NOTE — Telephone Encounter (Signed)
Notes recorded by Melvenia Needles, NP on 09/09/2018 at 3:43 PM EST CXR is okay , no sign of PNA  Please contact office for sooner follow up if symptoms do not improve or worsen or seek emergency care  ---------------------------------------------------- Attempted to contact pt. Call went straight to voicemail. I have left a message for her to return our call.

## 2018-09-26 NOTE — Telephone Encounter (Signed)
Patient returned call, CB is [phone]

## 2018-09-26 NOTE — Telephone Encounter (Signed)
Patient returned phone call. °

## 2018-09-26 NOTE — Telephone Encounter (Signed)
Attempted to contact pt. Call went straight to voicemail. I have left a message for her to return our call.

## 2018-09-27 NOTE — Telephone Encounter (Signed)
Spoke with pt in the lobby. She has been made aware of her results. Nothing further was needed.

## 2018-09-27 NOTE — Telephone Encounter (Signed)
Pt is returning call. Cb is [phone].

## 2018-09-27 NOTE — Telephone Encounter (Signed)
Patient is in lobby.

## 2018-09-27 NOTE — Telephone Encounter (Signed)
Attempted to contact pt. Call went straight to voicemail. I have left a message for her to return our call.

## 2018-09-30 ENCOUNTER — Ambulatory Visit: Payer: Medicare Other | Admitting: Adult Health

## 2018-10-07 ENCOUNTER — Encounter: Payer: Self-pay | Admitting: Adult Health

## 2018-10-07 ENCOUNTER — Ambulatory Visit (INDEPENDENT_AMBULATORY_CARE_PROVIDER_SITE_OTHER): Payer: Medicare Other | Admitting: Adult Health

## 2018-10-07 DIAGNOSIS — G4733 Obstructive sleep apnea (adult) (pediatric): Secondary | ICD-10-CM

## 2018-10-07 DIAGNOSIS — J45991 Cough variant asthma: Secondary | ICD-10-CM | POA: Diagnosis not present

## 2018-10-07 MED ORDER — BUDESONIDE-FORMOTEROL FUMARATE 80-4.5 MCG/ACT IN AERO: INHALATION_SPRAY | RESPIRATORY_TRACT | 1 refills | Status: DC

## 2018-10-07 NOTE — Progress Notes (Signed)
@Patient  ID: [name], female    DOB: June 27, 1951, 68 y.o.   MRN: [id number]  Chief Complaint  Patient presents with  . Follow-up    Cough    Referring provider: [name], MD  HPI: 68 year old female former smoker (1996 followed for asthma, obstructive sleep apnea and GERD. Medical history significant for A. fib on flecainide and Xarelto, chronic pain syndrome, breast cancer  , DM   TEST/EVENTS :  2013 AHI of 15 events per hour.  10/07/2018 Follow up : Asthma and OSA  Patient returns for a one-month follow-up.  Patient was seen last visit with a asthmatic bronchitic exacerbation.  She was treated with doxycycline and a prednisone April.  Her Symbicort was increased to 160 briefly.  Patient has resumed back to Symbicort 80 mg.  Patient says since last visit she is feeling much improved. Chest x-ray last visit showed no acute process.. Patient denies any flare of cough wheezing or shortness of breath.  No increased Saba use.  Patient remains on CPAP at bedtime.  Says that she wears it every night.  Says she cannot sleep without it.  She wears it between 4 to 6 hours each night.  CPAP download was requested    Allergies  Allergen Reactions  . Shellfish Allergy Hives and Shortness Of Breath  . Ace Inhibitors Cough  . Aminophylline Nausea And Vomiting    Heart races and pounds  . Epinephrine Other (See Comments)    Pallpitations during dental procedures  . Theophyllines Nausea And Vomiting and Other (See Comments)    Heart races and pounds  . Propoxyphene Hcl Nausea And Vomiting    Immunization History  Administered Date(s) Administered  . Influenza Split 06/25/2013, 04/25/2014  . Influenza Whole 05/26/2009, 06/25/2012  . Influenza, High Dose Seasonal PF 05/24/2018  . Influenza-Unspecified 05/10/2015, 04/25/2016  . Pneumococcal Polysaccharide-23 02/22/2012  . Zoster 04/25/2013    Past Medical History:  Diagnosis Date  . Acute renal failure ([place])  01/27/2015  . Acute respiratory failure ([place]) 01/27/2015  . Adenomatous colon polyp 1993  . Allergy   . Anemia   . Anxiety   . Anxiety disorder   . Asthma    Spirometry 1989: FEV1 2.12 (71%) with ratio 82.  HFA 90% after coaching 11-09-10  . Atrial flutter ([place]) chadsvasc of 3 (11/19/14)   Status post cardioversion 2-12 2013. s/p RFCA 5/13 with JAllred (coumadin and amio stopped)   . Breast cancer ([place])    left  . COPD (chronic obstructive pulmonary disease) ([place])   . DDD (degenerative disc disease)   . Diverticulosis   . Essential hypertension, benign   . Fibromyalgia   . GERD (gastroesophageal reflux disease)    EGD with HH / esophagitis 06-30-93  . Hiatal hernia   . History of atrial fibrillation   . Hyperlipidemia   . Internal and external hemorrhoids without complication   . Lipoma of colon   . Migraine   . Osteoarthritis   . Pelvic fracture ([place])   . Personal history of colonic polyps 06/14/2010  . Pneumonia   . PONV (postoperative nausea and vomiting)   . Radiation    left breast cancer  . Sleep apnea    cpap    Tobacco History: Social History   Tobacco Use  Smoking Status Former Smoker  . Packs/day: 1.00  . Years: 20.00  . Pack years: 20.00  . Types: Cigarettes  . Last attempt to quit: 09/25/1984  . Years since quitting:  34.0  Smokeless Tobacco Never Used   Counseling given: Not Answered   Outpatient Medications Prior to Visit  Medication Sig Dispense Refill  . ALPRAZolam (XANAX) 0.5 MG tablet Take 0.5 mg by mouth 2 (two) times daily as needed for anxiety.   0  . anastrozole (ARIMIDEX) 1 MG tablet Take 1 tablet (1 mg total) by mouth daily. 30 tablet 5  . atorvastatin (LIPITOR) 10 MG tablet Take 10 mg by mouth daily.  0  . benzonatate (TESSALON) 200 MG capsule Take 1 capsule (200 mg total) by mouth 3 (three) times daily as needed for cough. 30 capsule 1  . bisoprolol (ZEBETA) 10 MG tablet Take 1 tablet (10 mg total) by mouth daily. 30 tablet 2  .  calcium-vitamin D (OSCAL WITH D) 500-200 MG-UNIT per tablet Take 1 tablet by mouth daily with breakfast. (Patient taking differently: Take 2 tablets by mouth daily with breakfast. ) 30 tablet 3  . CARTIA XT 180 MG 24 hr capsule take 1 capsule by mouth once daily 90 capsule 3  . celecoxib (CELEBREX) 200 MG capsule Take 1 capsule by mouth every 3-4 days  0  . cyanocobalamin (,VITAMIN B-12,) 1000 MCG/ML injection Inject 1 mL (1,000 mcg total) into the muscle every 30 (thirty) days. 10 mL 0  . cyclobenzaprine (FLEXERIL) 10 MG tablet Take 1 tablet by mouth 3 (three) times daily. As needed  0  . flecainide (TAMBOCOR) 50 MG tablet TAKE 1 TABLET BY MOUTH TWICE DAILY 180 tablet 1  . fluticasone (FLONASE) 50 MCG/ACT nasal spray Place 2 sprays into both nostrils daily as needed for allergies.   0  . furosemide (LASIX) 40 MG tablet Take 40 mg by mouth 3 (three) times a week.   0  . GARCINIA CAMBOGIA-CHROMIUM PO Take 1 capsule by mouth daily.     . hydrOXYzine (ATARAX/VISTARIL) 25 MG tablet Take 1 tablet by mouth every 6 (six) hours as needed. Itching  0  . losartan (COZAAR) 100 MG tablet Take 100 mg by mouth daily.   1  . metFORMIN (GLUCOPHAGE) 500 MG tablet 1 tablet 2 (two) times daily with a meal.     . nystatin (NYAMYC) powder Apply topically 2 (two) times daily as needed. 60 g 2  . omeprazole (PRILOSEC) 40 MG capsule take 1 capsule by mouth once daily BEFORE BREAKFAST 90 capsule 0  . PARoxetine (PAXIL) 20 MG tablet Take 20 mg by mouth daily.    . potassium chloride (K-DUR) 10 MEQ tablet Take 10 mEq by mouth daily.   0  . PROAIR RESPICLICK 237 (90 Base) MCG/ACT AEPB INHALE 2 PUFFS BY MOUTH EVERY 4 HOURS IF NEEDED 1 each 0  . rivaroxaban (XARELTO) 20 MG TABS tablet TAKE 1 TABLET BY MOUTH WITH SUPPER 90 tablet 2  . SYMBICORT 80-4.5 MCG/ACT inhaler inhale 2 puffs by mouth twice a day Rinse mouth after use 30.6 g 0  . temazepam (RESTORIL) 30 MG capsule Take 1 capsule (30 mg total) by mouth at bedtime as needed  for sleep. 30 capsule 0  . XARELTO 20 MG TABS tablet TAKE 1 TABLET BY MOUTH EVERY DAY WITH SUPPER 30 tablet 6  . budesonide-formoterol (SYMBICORT) 160-4.5 MCG/ACT inhaler Inhale 2 puffs into the lungs 2 (two) times daily for 14 days. 1 Inhaler 0  . doxycycline (VIBRA-TABS) 100 MG tablet Take 1 tablet (100 mg total) by mouth 2 (two) times daily. (Patient not taking: Reported on 10/07/2018) 14 tablet 0  . predniSONE (DELTASONE) 10 MG tablet  4 tabs for 2 days, then 3 tabs for 2 days, 2 tabs for 2 days, then 1 tab for 2 days, then stop (Patient not taking: Reported on 10/07/2018) 20 tablet 0   Facility-Administered Medications Prior to Visit  Medication Dose Route Frequency Provider Last Rate Last Dose  . 0.9 %  sodium chloride infusion   Intravenous Continuous Penland, Kelby Fam, MD         Review of Systems:   Constitutional:   No  weight loss, night sweats,  Fevers, chills, fatigue, or  lassitude.  HEENT:   No headaches,  Difficulty swallowing,  Tooth/dental problems, or  Sore throat,                No sneezing, itching, ear ache,  +nasal congestion, post nasal drip,   CV:  No chest pain,  Orthopnea, PND, swelling in lower extremities, anasarca, dizziness, palpitations, syncope.   GI  No heartburn, indigestion, abdominal pain, nausea, vomiting, diarrhea, change in bowel habits, loss of appetite, bloody stools.   Resp: No shortness of breath with exertion or at rest.  No excess mucus, no productive cough,  No non-productive cough,  No coughing up of blood.  No change in color of mucus.  No wheezing.  No chest wall deformity  Skin: no rash or lesions.  GU: no dysuria, change in color of urine, no urgency or frequency.  No flank pain, no hematuria   MS:  No joint pain or swelling.  No decreased range of motion.  No back pain.    Physical Exam  BP (!) 122/58 (BP Location: Right Arm, Cuff Size: Large)   Pulse 100   Ht 5\' 6"  (1.676 m)   Wt 277 lb (125.6 kg)   SpO2 95%   BMI 44.71  kg/m   GEN: A/Ox3; pleasant , NAD, elderly , obese    HEENT:  [place]/AT,  EACs-clear, TMs-wnl, NOSE-clear drainage , THROAT-clear, no lesions, no postnasal drip or exudate noted.   NECK:  Supple w/ fair ROM; no JVD; normal carotid impulses w/o bruits; no thyromegaly or nodules palpated; no lymphadenopathy.    RESP  Clear  P & A; w/o, wheezes/ rales/ or rhonchi. no accessory muscle use, no dullness to percussion  CARD:  RRR, no m/r/g, tr -1 + peripheral edema, pulses intact, no cyanosis or clubbing.  GI:   Soft & nt; nml bowel sounds; no organomegaly or masses detected.   Musco: Warm bil, no deformities or joint swelling noted.   Neuro: alert, no focal deficits noted.    Skin: Warm, no lesions or rashes    Lab Results:  CBC  BMET  BNP No results found for: BNP  ProBNP No results found for: PROBNP  Imaging: Dg Chest 2 View  Result Date: 09/09/2018 CLINICAL DATA:  Cough and congestion.  Shortness of breath. EXAM: CHEST - 2 VIEW COMPARISON:  October 02, 2016 FINDINGS: Cardiomegaly. The hila and mediastinum are unchanged. No pulmonary nodules, masses, or focal infiltrates. No overt edema. IMPRESSION: No active cardiopulmonary disease. Electronically Signed   By: Dorise Bullion III M.D   On: 09/09/2018 13:13    levalbuterol Penne Lash) nebulizer solution 0.63 mg    Date Action Dose Route User   09/09/2018 1257 Given 0.63 mg Nebulization Parke Poisson E, CMA      No flowsheet data found.  Lab Results  Component Value Date   NITRICOXIDE 9 02/01/2016        Assessment & Plan:   Cough variant asthma  with prominent upper airway features  Recent flare with URI/Bronchitis -now resolved  Continue on Symbicort 80  Asthma action plan discussed Trigger prevention   Plan  Patient Instructions  Continue on Symbicort 65mcg  2 puffs twice daily, rinse after use ProAir Inhaler 2 puffs every 4hr as needed for wheezing  Mucinex DM Twice daily  As needed  Cough/congestion    Saline nasal rinses As needed   Flonase 2 puffs daily As needed  Nasal congestion .  Follow-up with Dr. Elsworth Soho in 6 months and as needed     Morbid (severe) obesity due to excess calories ([place]) Wt loss     Rexene Edison, NP 10/07/2018

## 2018-10-07 NOTE — Patient Instructions (Addendum)
Continue on Symbicort 32mcg  2 puffs twice daily, rinse after use ProAir Inhaler 2 puffs every 4hr as needed for wheezing  Mucinex DM Twice daily  As needed  Cough/congestion  Saline nasal rinses As needed   Flonase 2 puffs daily As needed  Nasal congestion .  Continue on CPAP at bedtime. Continue to work on healthy weight. Follow-up with Dr. Elsworth Soho in 6 months and as needed

## 2018-10-07 NOTE — Assessment & Plan Note (Signed)
Wt loss  

## 2018-10-07 NOTE — Addendum Note (Signed)
Addended by: Annie Paras D on: 10/07/2018 12:48 PM   Modules accepted: Orders

## 2018-10-07 NOTE — Assessment & Plan Note (Signed)
Recent flare with URI/Bronchitis -now resolved  Continue on Symbicort 80  Asthma action plan discussed Trigger prevention   Plan  Patient Instructions  Continue on Symbicort 78mcg  2 puffs twice daily, rinse after use ProAir Inhaler 2 puffs every 4hr as needed for wheezing  Mucinex DM Twice daily  As needed  Cough/congestion  Saline nasal rinses As needed   Flonase 2 puffs daily As needed  Nasal congestion .  Follow-up with Dr. Elsworth Soho in 6 months and as needed

## 2018-10-07 NOTE — Assessment & Plan Note (Signed)
Continue on CPAP at bedtime Work on healthy weight CPAP download was requested

## 2018-10-17 ENCOUNTER — Other Ambulatory Visit (HOSPITAL_COMMUNITY): Payer: Self-pay

## 2018-10-17 DIAGNOSIS — M8588 Other specified disorders of bone density and structure, other site: Secondary | ICD-10-CM

## 2018-10-17 DIAGNOSIS — C50212 Malignant neoplasm of upper-inner quadrant of left female breast: Secondary | ICD-10-CM

## 2018-10-17 DIAGNOSIS — M8580 Other specified disorders of bone density and structure, unspecified site: Secondary | ICD-10-CM

## 2018-10-17 DIAGNOSIS — Z170 Estrogen receptor positive status [ER+]: Principal | ICD-10-CM

## 2018-10-18 ENCOUNTER — Other Ambulatory Visit (HOSPITAL_COMMUNITY): Payer: Medicare Other

## 2018-10-18 ENCOUNTER — Inpatient Hospital Stay (HOSPITAL_COMMUNITY): Payer: Medicare Other

## 2018-10-18 ENCOUNTER — Inpatient Hospital Stay (HOSPITAL_COMMUNITY): Payer: Medicare Other | Admitting: Hematology

## 2018-10-18 NOTE — Progress Notes (Deleted)
[name], [place] [address]   CLINIC:  Medical Oncology/Hematology  PCP:  Shelley Chroman, MD [place] [address] [phone]   REASON FOR VISIT: Follow-up for Stage IA left breast invasive ductal carcinoma; ER+/PR+/HER2- AND Vitamin B 12 Deficiency.   CURRENT THERAPY: lumpectomy AND B 12 injections  BRIEF ONCOLOGIC HISTORY:    Breast cancer of upper-inner quadrant of left Murphy breast ([place])   12/15/2014 Mammogram    new 96m round nodule in left breast is indeterminate    12/17/2014 Imaging    6 mm lobulated nodule in the left breast is at low suspicion for malignancy. ultrasound guided biopsy recommended    12/23/2014 Procedure    [facility] Invasive ductal carcinoma.  ER+ (100%), PR+ (99%), HER2 neg (ratio 1.61). Ki67 45%.     01/26/2015 Surgery    (L) lumpectomy with SLNB ([name]: IDC, grade 2, spanning 1.1 cm; also low-grade DCIS; margins negative. 0/3 left axillary SLN. HER2 repeated and remains neg. (ratio 1.33).  pT1c, pN0: Stage IA    03/19/2015 Genetic Testing    Genetic testing: Negative. Genes analyzed: ATM, BARD1, BRCA1, BRCA2, BRIP1, CDH1, CHEK2, FANCC, MLH1, MSH2, MSH6, NBN, PALB2, PMS2, PTEN, RAD51C, RAD51D, STK11, TP53, and XRCC2.  Additionally, this panel includes deletion/duplication analysis (without next-generation sequencing) of one gene, EPCAM.    03/2015 - 04/2015 Radiation Therapy    Adjuvant breast radiation with Dr. WPablo Murphy     04/30/2015 Imaging    DEXA scan: Osteopenia. (T-Score of -1.1)    05/2015 -  Anti-estrogen oral therapy    Anastrozole. Planned duration of therapy 5-10 years.     07/16/2015 Miscellaneous    Prolia injections started.      INTERVAL HISTORY:  [name] returns for routine follow-up for left breast cancer and vitamin B 12 deficiency.     REVIEW OF SYSTEMS:  Review of Systems - Oncology   PAST MEDICAL/SURGICAL HISTORY:    Past Medical History:  Diagnosis Date  . Acute renal failure ([facility] 01/27/2015  . Acute respiratory failure ([facility] 01/27/2015  . Adenomatous colon polyp 1993  . Allergy   . Anemia   . Anxiety   . Anxiety disorder   . Asthma    Spirometry 1989: FEV1 2.12 (71%) with ratio 82.  HFA 90% after coaching 11-09-10  . Atrial flutter ([place] chadsvasc of 3 (11/19/14)   Status post cardioversion 2-12 2013. s/p RFCA 5/13 with JAllred (coumadin and amio stopped)   . Breast cancer ([place]    left  . COPD (chronic obstructive pulmonary disease) ([facility]   . DDD (degenerative disc disease)   . Diverticulosis   . Essential hypertension, benign   . Fibromyalgia   . GERD (gastroesophageal reflux disease)    EGD with HH / esophagitis 06-30-93  . Hiatal hernia   . History of atrial fibrillation   . Hyperlipidemia   . Internal and external hemorrhoids without complication   . Lipoma of colon   . Migraine   . Osteoarthritis   . Pelvic fracture ([place] [place]   . Personal history of colonic polyps 06/14/2010  . Pneumonia   . PONV (postoperative nausea and vomiting)   . Radiation    left breast cancer  . Sleep apnea    cpap   Past Surgical History:  Procedure Laterality Date  . A FLUTTER ABLATION  02/22/2012  . ATRIAL FLUTTER ABLATION N/A 02/22/2012   Procedure:  ATRIAL FLUTTER ABLATION;  Surgeon: Thompson Grayer, MD;  Location: [facility] CATH LAB;  Service: Cardiovascular;  Laterality: N/A;  . BREAST BIOPSY Left 11/2014  . CARDIOVERSION N/A 02/19/2014   Procedure: CARDIOVERSION;  Surgeon: Dorothy Spark, MD;  Location: [place];  Service: Cardiovascular;  Laterality: N/A;  . COLONOSCOPY  05/2010, 07/2000  . DILATION AND CURETTAGE OF UTERUS    . ESOPHAGOGASTRODUODENOSCOPY  05/2010, 06/1993  . JOINT REPLACEMENT    . POLYPECTOMY    . RADIOACTIVE SEED GUIDED PARTIAL MASTECTOMY WITH AXILLARY SENTINEL LYMPH NODE BIOPSY Left 01/26/2015   Procedure: LEFT  PARTIAL MASTECTOMY WITH  RADIOACTIVE SEED LOCALIZATION, LEFT AXILLARY SENTINEL LYMPH  NODE BIOPSY;  Surgeon: Fanny Skates, MD;  Location: [place];  Service: General;  Laterality: Left;  . ROTATOR CUFF REPAIR     bilateral  . ROTATOR CUFF REPAIR     bil  . TOTAL KNEE ARTHROPLASTY     x2 09/2006  . UPPER GASTROINTESTINAL ENDOSCOPY       SOCIAL HISTORY:  Social History   Socioeconomic History  . Marital status: Widowed    Spouse name: Not on file  . Number of children: 0  . Years of education: Not on file  . Highest education level: Not on file  Occupational History  . Occupation: Database administrator at [employer]  . Financial resource strain: Not on file  . Food insecurity:    Worry: Not on file    Inability: Not on file  . Transportation needs:    Medical: Not on file    Non-medical: Not on file  Tobacco Use  . Smoking status: Former Smoker    Packs/day: 1.00    Years: 20.00    Pack years: 20.00    Types: Cigarettes    Last attempt to quit: 09/25/1984    Years since quitting: 34.0  . Smokeless tobacco: Never Used  Substance and Sexual Activity  . Alcohol use: No    Alcohol/week: 0.0 standard drinks  . Drug use: No  . Sexual activity: Not Currently  Lifestyle  . Physical activity:    Days per week: Not on file    Minutes per session: Not on file  . Stress: Not on file  Relationships  . Social connections:    Talks on phone: Not on file    Gets together: Not on file    Attends religious service: Not on file    Active member of club or organization: Not on file    Attends meetings of clubs or organizations: Not on file    Relationship status: Not on file  . Intimate partner violence:    Fear of current or ex partner: Not on file    Emotionally abused: Not on file    Physically abused: Not on file    Forced sexual activity: Not on file  Other Topics Concern  . Not on file  Social History Narrative  . Not on file    FAMILY HISTORY:  Family History  Problem Relation Age of Onset  . Emphysema Father         smoker  . Heart disease Father   . Breast cancer Mother 4  . Breast cancer Sister 84  . Heart Problems Sister   . Heart disease Sister   . Breast cancer Maternal Aunt 48  . Lung cancer Maternal Uncle 5       smoker  . Colon cancer Paternal Uncle 35  . Diabetes Maternal Grandmother   .  Heart Problems Maternal Grandfather   . Stroke Paternal Grandfather   . Lung cancer Maternal Uncle 72       smoker  . Heart Problems Paternal Uncle   . Diabetes Paternal Grandmother   . Rectal cancer Neg Hx   . Stomach cancer Neg Hx   . Esophageal cancer Neg Hx     CURRENT MEDICATIONS:  Outpatient Encounter Medications as of 10/18/2018  Medication Sig Note  . ALPRAZolam (XANAX) 0.5 MG tablet Take 0.5 mg by mouth 2 (two) times daily as needed for anxiety.  02/19/2015: Received from: External Pharmacy  . anastrozole (ARIMIDEX) 1 MG tablet Take 1 tablet (1 mg total) by mouth daily.   Marland Kitchen atorvastatin (LIPITOR) 10 MG tablet Take 10 mg by mouth daily. 06/03/2015: Received from: External Pharmacy  . benzonatate (TESSALON) 200 MG capsule Take 1 capsule (200 mg total) by mouth 3 (three) times daily as needed for cough.   . bisoprolol (ZEBETA) 10 MG tablet Take 1 tablet (10 mg total) by mouth daily.   . budesonide-formoterol (SYMBICORT) 80-4.5 MCG/ACT inhaler inhale 2 puffs by mouth twice a day Rinse mouth after use   . calcium-vitamin D (OSCAL WITH D) 500-200 MG-UNIT per tablet Take 1 tablet by mouth daily with breakfast. (Patient taking differently: Take 2 tablets by mouth daily with breakfast. )   . CARTIA XT 180 MG 24 hr capsule take 1 capsule by mouth once daily   . celecoxib (CELEBREX) 200 MG capsule Take 1 capsule by mouth every 3-4 days 04/26/2015: Received from: External Pharmacy Received Sig:   . cyanocobalamin (,VITAMIN B-12,) 1000 MCG/ML injection Inject 1 mL (1,000 mcg total) into the muscle every 30 (thirty) days.   . cyclobenzaprine (FLEXERIL) 10 MG tablet Take 1 tablet by mouth 3 (three) times daily.  As needed 02/01/2016: Received from: External Pharmacy Received Sig: take 1 tablet by mouth three times a day if needed for SPASMS  . flecainide (TAMBOCOR) 50 MG tablet TAKE 1 TABLET BY MOUTH TWICE DAILY   . fluticasone (FLONASE) 50 MCG/ACT nasal spray Place 2 sprays into both nostrils daily as needed for allergies.    . furosemide (LASIX) 40 MG tablet Take 40 mg by mouth 3 (three) times a week.  04/26/2015: Received from: External Pharmacy  . GARCINIA CAMBOGIA-CHROMIUM PO Take 1 capsule by mouth daily.    . hydrOXYzine (ATARAX/VISTARIL) 25 MG tablet Take 1 tablet by mouth every 6 (six) hours as needed. Itching 04/26/2015: Received from: External Pharmacy Received Sig: take 1 tablet by mouth every 6 hours if needed for itching  . losartan (COZAAR) 100 MG tablet Take 100 mg by mouth daily.  02/19/2015: Received from: External Pharmacy  . metFORMIN (GLUCOPHAGE) 500 MG tablet 1 tablet 2 (two) times daily with a meal.    . nystatin (NYAMYC) powder Apply topically 2 (two) times daily as needed.   Marland Kitchen omeprazole (PRILOSEC) 40 MG capsule take 1 capsule by mouth once daily BEFORE BREAKFAST   . PARoxetine (PAXIL) 20 MG tablet Take 20 mg by mouth daily.   . potassium chloride (K-DUR) 10 MEQ tablet Take 10 mEq by mouth daily.  04/26/2015: Received from: External Pharmacy Received Sig:   . PROAIR RESPICLICK 482 (90 Base) MCG/ACT AEPB INHALE 2 PUFFS BY MOUTH EVERY 4 HOURS IF NEEDED   . rivaroxaban (XARELTO) 20 MG TABS tablet TAKE 1 TABLET BY MOUTH WITH SUPPER   . temazepam (RESTORIL) 30 MG capsule Take 1 capsule (30 mg total) by mouth at bedtime as needed  for sleep.   Alveda Reasons 20 MG TABS tablet TAKE 1 TABLET BY MOUTH EVERY DAY WITH SUPPER    Facility-Administered Encounter Medications as of 10/18/2018  Medication  . 0.9 %  sodium chloride infusion    ALLERGIES:  Allergies  Allergen Reactions  . Shellfish Allergy Hives and Shortness Of Breath  . Ace Inhibitors Cough  . Aminophylline Nausea And Vomiting    Heart  races and pounds  . Epinephrine Other (See Comments)    Pallpitations during dental procedures  . Theophyllines Nausea And Vomiting and Other (See Comments)    Heart races and pounds  . Propoxyphene Hcl Nausea And Vomiting     PHYSICAL EXAM:  ECOG Performance status: 1  There were no vitals filed for this visit. There were no vitals filed for this visit.  Physical Exam   LABORATORY DATA:  I have reviewed the labs as listed.  CBC    Component Value Date/Time   WBC 7.3 04/17/2018 0911   RBC 3.62 (L) 04/17/2018 0911   HGB 10.6 (L) 04/17/2018 0911   HCT 34.2 (L) 04/17/2018 0911   PLT 313 04/17/2018 0911   MCV 94.5 04/17/2018 0911   MCH 29.3 04/17/2018 0911   MCHC 31.0 04/17/2018 0911   RDW 14.9 04/17/2018 0911   LYMPHSABS 1.3 04/17/2018 0911   MONOABS 0.5 04/17/2018 0911   EOSABS 0.2 04/17/2018 0911   BASOSABS 0.1 04/17/2018 0911   CMP Latest Ref Rng & Units 04/17/2018 10/10/2017 01/18/2017  Glucose 70 - 99 mg/dL 125(H) 141(H) 169(H)  BUN 8 - 23 mg/dL 26(H) 19 20  Creatinine 0.44 - 1.00 mg/dL 1.50(H) 1.16(H) 1.30(H)  Sodium 135 - 145 mmol/L 140 137 137  Potassium 3.5 - 5.1 mmol/L 3.8 3.7 3.6  Chloride 98 - 111 mmol/L 97(L) 94(L) 99(L)  CO2 22 - 32 mmol/L _0 Calcium 8.9 - 10.3 mg/dL 9.4 9.5 9.2  Total Protein 6.5 - 8.1 g/dL 7.8 8.4(H) 7.7  Total Bilirubin 0.3 - 1.2 mg/dL 0.3 0.5 0.5  Alkaline Phos 38 - 126 U/L 53 67 64  AST 15 - 41 U/L _1 ALT 0 - 44 U/L 17 13(L) 27       DIAGNOSTIC IMAGING:  I have independently reviewed the scans and discussed with the patient.   I have reviewed Shelley Finders, NP's note and agree with the documentation.  I personally performed a face-to-face visit, made revisions and my assessment and plan is as follows.    ASSESSMENT & PLAN:   No problem-specific Assessment & Plan notes found for this encounter.      Orders placed this encounter:  No orders of the defined types were placed in this  encounter.     Derek Jack, MD [place] [phone]

## 2018-10-22 ENCOUNTER — Ambulatory Visit: Payer: Medicare Other | Admitting: Adult Health

## 2018-10-25 ENCOUNTER — Ambulatory Visit (INDEPENDENT_AMBULATORY_CARE_PROVIDER_SITE_OTHER): Payer: Medicare Other | Admitting: Adult Health

## 2018-10-25 ENCOUNTER — Telehealth: Payer: Self-pay

## 2018-10-25 ENCOUNTER — Encounter: Payer: Self-pay | Admitting: Adult Health

## 2018-10-25 DIAGNOSIS — J45991 Cough variant asthma: Secondary | ICD-10-CM | POA: Diagnosis not present

## 2018-10-25 DIAGNOSIS — J069 Acute upper respiratory infection, unspecified: Secondary | ICD-10-CM | POA: Insufficient documentation

## 2018-10-25 LAB — NITRIC OXIDE: Nitric Oxide: 7

## 2018-10-25 MED ORDER — ALBUTEROL SULFATE (2.5 MG/3ML) 0.083% IN NEBU: 2.5000 mg | INHALATION_SOLUTION | RESPIRATORY_TRACT | 5 refills | Status: AC | PRN

## 2018-10-25 MED ORDER — BUDESONIDE-FORMOTEROL FUMARATE 160-4.5 MCG/ACT IN AERO: 2.0000 | INHALATION_SPRAY | Freq: Two times a day (BID) | RESPIRATORY_TRACT | 3 refills | Status: DC

## 2018-10-25 MED ORDER — BUDESONIDE-FORMOTEROL FUMARATE 160-4.5 MCG/ACT IN AERO: 2.0000 | INHALATION_SPRAY | Freq: Two times a day (BID) | RESPIRATORY_TRACT | 0 refills | Status: DC

## 2018-10-25 MED ORDER — LEVALBUTEROL HCL 0.63 MG/3ML IN NEBU
0.6300 mg | INHALATION_SOLUTION | Freq: Once | RESPIRATORY_TRACT | Status: AC
  Administered 2018-10-25: 0.63 mg via RESPIRATORY_TRACT

## 2018-10-25 MED ORDER — ALBUTEROL SULFATE (2.5 MG/3ML) 0.083% IN NEBU: 2.5000 mg | INHALATION_SOLUTION | Freq: Four times a day (QID) | RESPIRATORY_TRACT | 5 refills | Status: DC | PRN

## 2018-10-25 NOTE — Telephone Encounter (Signed)
Unable to reach pharmacist on phone and sent to pharmacy voicemail.  Left voicemail: Two prescriptions were sent in for Albuterol neb solution for [facility].  One order states to use Albuterol neb Q4 hours PRN and the other is for Q6 hours PRN.  Please discontinue the Albuterol neb solution ordered for Q6 hours, this has been discontinued. Please fill the new order sent in by Parke Poisson for Rexene Edison NP for Albuterol neb solution for Q4 hours which has also been sent to [facility].  Phone [phone].

## 2018-10-25 NOTE — Addendum Note (Signed)
Addended by: Parke Poisson E on: 10/25/2018 04:20 PM   Modules accepted: Orders

## 2018-10-25 NOTE — Addendum Note (Signed)
Addended by: Parke Poisson E on: 10/25/2018 04:17 PM   Modules accepted: Orders

## 2018-10-25 NOTE — Patient Instructions (Signed)
Increase  on Symbicort 160 2 puffs Twice daily   ProAir Inhaler 2 puffs every 4hr as needed for wheezing  Or Albuterol Neb every 4hr as needed.  Mucinex DM Twice daily  As needed  Cough/congestion  Saline nasal rinses As needed   Flonase 2 puffs daily As needed  Nasal congestion .  follow up Dr. Elsworth Soho or Shed Nixon NP   In 4-6 weeks and As needed

## 2018-10-25 NOTE — Assessment & Plan Note (Signed)
Mild flare  FENO is normal , hold on steroids for now  Increase Symbicort dose  xopenex neb x 1   Plan  Patient Instructions  Increase  on Symbicort 160 2 puffs Twice daily   ProAir Inhaler 2 puffs every 4hr as needed for wheezing  Or Albuterol Neb every 4hr as needed.  Mucinex DM Twice daily  As needed  Cough/congestion  Saline nasal rinses As needed   Flonase 2 puffs daily As needed  Nasal congestion .  follow up Dr. Elsworth Soho or Selisa Tensley NP   In 4-6 weeks and As needed

## 2018-10-25 NOTE — Progress Notes (Signed)
@Patient  ID: Margretta Ditty, female    DOB: 1951-02-22, 68 y.o.   MRN: [id number]  Chief Complaint  Patient presents with  . Acute Visit    Asthma     Referring provider: [name], MD  HPI: 68 year old female former smoker (1996 followed for asthma, obstructive sleep apnea and GERD. Medical history significant for A. fib on flecainide and Xarelto,chronic pain syndrome, breast cancer , DM   TEST/EVENTS : PFT 2012 > FEV1 72%, ratio 80, FVC 67%, DLCO 71% 2013AHI of 15 events per hour.    10/25/2018 Acute OV : Asthma  Patient presents for an acute office visit.  She complains over the last 2 days of increased cough with thick mucus however hard to cough up anything.  She denies any fever, discolored mucus hemoptysis, chest pain, orthopnea.  Patient is on Symbicort 80 twice daily.  Says she was doing well up until the last 2 days ago.   Complains of increased tightness and shortness of breath.  Has been using some Mucinex for her cough.  Has tried Tessalon but felt that it did not work..  FENO today is normal 8ppb   Allergies  Allergen Reactions  . Shellfish Allergy Hives and Shortness Of Breath  . Ace Inhibitors Cough  . Aminophylline Nausea And Vomiting    Heart races and pounds  . Epinephrine Other (See Comments)    Pallpitations during dental procedures  . Theophyllines Nausea And Vomiting and Other (See Comments)    Heart races and pounds  . Propoxyphene Hcl Nausea And Vomiting    Immunization History  Administered Date(s) Administered  . Influenza Split 06/25/2013, 04/25/2014  . Influenza Whole 05/26/2009, 06/25/2012  . Influenza, High Dose Seasonal PF 05/24/2018  . Influenza-Unspecified 05/10/2015, 04/25/2016  . Pneumococcal Polysaccharide-23 02/22/2012  . Zoster 04/25/2013    Past Medical History:  Diagnosis Date  . Acute renal failure ([place]) 01/27/2015  . Acute respiratory failure ([place]) 01/27/2015  . Adenomatous colon polyp 1993  . Allergy     . Anemia   . Anxiety   . Anxiety disorder   . Asthma    Spirometry 1989: FEV1 2.12 (71%) with ratio 82.  HFA 90% after coaching 11-09-10  . Atrial flutter ([place]) chadsvasc of 3 (11/19/14)   Status post cardioversion 2-12 2013. s/p RFCA 5/13 with JAllred (coumadin and amio stopped)   . Breast cancer ([place])    left  . COPD (chronic obstructive pulmonary disease) ([place])   . DDD (degenerative disc disease)   . Diverticulosis   . Essential hypertension, benign   . Fibromyalgia   . GERD (gastroesophageal reflux disease)    EGD with HH / esophagitis 06-30-93  . Hiatal hernia   . History of atrial fibrillation   . Hyperlipidemia   . Internal and external hemorrhoids without complication   . Lipoma of colon   . Migraine   . Osteoarthritis   . Pelvic fracture ([place])   . Personal history of colonic polyps 06/14/2010  . Pneumonia   . PONV (postoperative nausea and vomiting)   . Radiation    left breast cancer  . Sleep apnea    cpap    Tobacco History: Social History   Tobacco Use  Smoking Status Former Smoker  . Packs/day: 1.00  . Years: 20.00  . Pack years: 20.00  . Types: Cigarettes  . Last attempt to quit: 09/25/1984  . Years since quitting: 34.1  Smokeless Tobacco Never Used   Counseling given: Not Answered  Outpatient Medications Prior to Visit  Medication Sig Dispense Refill  . ALPRAZolam (XANAX) 0.5 MG tablet Take 0.5 mg by mouth 2 (two) times daily as needed for anxiety.   0  . anastrozole (ARIMIDEX) 1 MG tablet Take 1 tablet (1 mg total) by mouth daily. 30 tablet 5  . atorvastatin (LIPITOR) 10 MG tablet Take 10 mg by mouth daily.  0  . benzonatate (TESSALON) 200 MG capsule Take 1 capsule (200 mg total) by mouth 3 (three) times daily as needed for cough. 30 capsule 1  . bisoprolol (ZEBETA) 10 MG tablet Take 1 tablet (10 mg total) by mouth daily. 30 tablet 2  . budesonide-formoterol (SYMBICORT) 80-4.5 MCG/ACT inhaler inhale 2 puffs by mouth twice a day Rinse mouth after  use 30.6 g 1  . calcium-vitamin D (OSCAL WITH D) 500-200 MG-UNIT per tablet Take 1 tablet by mouth daily with breakfast. (Patient taking differently: Take 2 tablets by mouth daily with breakfast. ) 30 tablet 3  . CARTIA XT 180 MG 24 hr capsule take 1 capsule by mouth once daily 90 capsule 3  . celecoxib (CELEBREX) 200 MG capsule Take 1 capsule by mouth every 3-4 days  0  . cyanocobalamin (,VITAMIN B-12,) 1000 MCG/ML injection Inject 1 mL (1,000 mcg total) into the muscle every 30 (thirty) days. 10 mL 0  . cyclobenzaprine (FLEXERIL) 10 MG tablet Take 1 tablet by mouth 3 (three) times daily. As needed  0  . flecainide (TAMBOCOR) 50 MG tablet TAKE 1 TABLET BY MOUTH TWICE DAILY 180 tablet 1  . fluticasone (FLONASE) 50 MCG/ACT nasal spray Place 2 sprays into both nostrils daily as needed for allergies.   0  . furosemide (LASIX) 40 MG tablet Take 40 mg by mouth 3 (three) times a week.   0  . GARCINIA CAMBOGIA-CHROMIUM PO Take 1 capsule by mouth daily.     . hydrOXYzine (ATARAX/VISTARIL) 25 MG tablet Take 1 tablet by mouth every 6 (six) hours as needed. Itching  0  . losartan (COZAAR) 100 MG tablet Take 100 mg by mouth daily.   1  . metFORMIN (GLUCOPHAGE) 500 MG tablet 1 tablet 2 (two) times daily with a meal.     . nystatin (NYAMYC) powder Apply topically 2 (two) times daily as needed. 60 g 2  . omeprazole (PRILOSEC) 40 MG capsule take 1 capsule by mouth once daily BEFORE BREAKFAST 90 capsule 0  . PARoxetine (PAXIL) 20 MG tablet Take 20 mg by mouth daily.    . potassium chloride (K-DUR) 10 MEQ tablet Take 10 mEq by mouth daily.   0  . PROAIR RESPICLICK 382 (90 Base) MCG/ACT AEPB INHALE 2 PUFFS BY MOUTH EVERY 4 HOURS IF NEEDED 1 each 0  . rivaroxaban (XARELTO) 20 MG TABS tablet TAKE 1 TABLET BY MOUTH WITH SUPPER 90 tablet 2  . temazepam (RESTORIL) 30 MG capsule Take 1 capsule (30 mg total) by mouth at bedtime as needed for sleep. 30 capsule 0  . XARELTO 20 MG TABS tablet TAKE 1 TABLET BY MOUTH EVERY DAY  WITH SUPPER 30 tablet 6   Facility-Administered Medications Prior to Visit  Medication Dose Route Frequency Provider Last Rate Last Dose  . 0.9 %  sodium chloride infusion   Intravenous Continuous Penland, Kelby Fam, MD         Review of Systems:   Constitutional:   No  weight loss, night sweats,  Fevers, chills,  +fatigue, or  lassitude.  HEENT:   No headaches,  Difficulty  swallowing,  Tooth/dental problems, or  Sore throat,                No sneezing, itching, ear ache,  +nasal congestion, post nasal drip,   CV:  No chest pain,  Orthopnea, PND, swelling in lower extremities, anasarca, dizziness, palpitations, syncope.   GI  No heartburn, indigestion, abdominal pain, nausea, vomiting, diarrhea, change in bowel habits, loss of appetite, bloody stools. +loose stools   Resp:   No chest wall deformity  Skin: no rash or lesions.  GU: no dysuria, change in color of urine, no urgency or frequency.  No flank pain, no hematuria   MS:  No joint pain or swelling.  No decreased range of motion.  No back pain.    Physical Exam  BP 116/74 (BP Location: Right Arm, Cuff Size: Large)   Pulse 94   Temp 98.2 F (36.8 C) (Oral)   Ht 5\' 6"  (1.676 m)   Wt 267 lb 6.4 oz (121.3 kg)   SpO2 91%   BMI 43.16 kg/m   GEN: A/Ox3; pleasant , NAD, obese    HEENT:  [place]/AT,  EACs-clear, TMs-wnl, NOSE-clear, THROAT-clear, no lesions, no postnasal drip or exudate noted. Class 3 MP airway   NECK:  Supple w/ fair ROM; no JVD; normal carotid impulses w/o bruits; no thyromegaly or nodules palpated; no lymphadenopathy.    RESP  Few trace rhonchi , no wheezing  . no accessory muscle use, no dullness to percussion  CARD:  RRR, no m/r/g, tr  peripheral edema, pulses intact, no cyanosis or clubbing.  GI:   Soft & nt; nml bowel sounds; no organomegaly or masses detected.   Musco: Warm bil, no deformities or joint swelling noted.   Neuro: alert, no focal deficits noted.    Skin: Warm, no lesions or  rashes    Lab Results:  CBC  BNP No results found for: BNP  ProBNP No results found for: PROBNP  Imaging: No results found.  levalbuterol (XOPENEX) nebulizer solution 0.63 mg    Date Action Dose Route User   09/09/2018 1257 Given 0.63 mg Nebulization Parke Poisson E, CMA      No flowsheet data found.  Lab Results  Component Value Date   NITRICOXIDE 9 02/01/2016        Assessment & Plan:   URI (upper respiratory infection) URI with mild asthma flare  Suspect is viral in nature .  Supportive care  Increase symbicort dose , add Albuterol neb As needed  At home .  Hold on steroids and abx at this time as no evidence of active wheezing or discolored mucus/fever.  xopenex neb x 1   Plan  Patient Instructions  Increase  on Symbicort 160 2 puffs Twice daily   ProAir Inhaler 2 puffs every 4hr as needed for wheezing  Or Albuterol Neb every 4hr as needed.  Mucinex DM Twice daily  As needed  Cough/congestion  Saline nasal rinses As needed   Flonase 2 puffs daily As needed  Nasal congestion .  follow up Dr. Elsworth Soho or Dajanae Brophy NP   In 4-6 weeks and As needed       Cough variant asthma with prominent upper airway features  Mild flare  FENO is normal , hold on steroids for now  Increase Symbicort dose  xopenex neb x 1   Plan  Patient Instructions  Increase  on Symbicort 160 2 puffs Twice daily   ProAir Inhaler 2 puffs every 4hr as needed for wheezing  Or Albuterol Neb every 4hr as needed.  Mucinex DM Twice daily  As needed  Cough/congestion  Saline nasal rinses As needed   Flonase 2 puffs daily As needed  Nasal congestion .  follow up Dr. Elsworth Soho or Leanza Shepperson NP   In 4-6 weeks and As needed          Rexene Edison, NP 10/25/2018

## 2018-10-25 NOTE — Assessment & Plan Note (Signed)
URI with mild asthma flare  Suspect is viral in nature .  Supportive care  Increase symbicort dose , add Albuterol neb As needed  At home .  Hold on steroids and abx at this time as no evidence of active wheezing or discolored mucus/fever.  xopenex neb x 1   Plan  Patient Instructions  Increase  on Symbicort 160 2 puffs Twice daily   ProAir Inhaler 2 puffs every 4hr as needed for wheezing  Or Albuterol Neb every 4hr as needed.  Mucinex DM Twice daily  As needed  Cough/congestion  Saline nasal rinses As needed   Flonase 2 puffs daily As needed  Nasal congestion .  follow up Dr. Elsworth Soho or Parrett NP   In 4-6 weeks and As needed

## 2018-11-05 ENCOUNTER — Other Ambulatory Visit (HOSPITAL_COMMUNITY): Payer: Self-pay | Admitting: Hematology

## 2018-11-05 DIAGNOSIS — C50212 Malignant neoplasm of upper-inner quadrant of left female breast: Secondary | ICD-10-CM

## 2018-11-05 DIAGNOSIS — Z170 Estrogen receptor positive status [ER+]: Principal | ICD-10-CM

## 2018-11-07 ENCOUNTER — Ambulatory Visit (HOSPITAL_COMMUNITY): Payer: Medicare Other | Admitting: Hematology

## 2018-11-07 ENCOUNTER — Other Ambulatory Visit (HOSPITAL_COMMUNITY): Payer: Medicare Other

## 2018-11-07 ENCOUNTER — Ambulatory Visit (HOSPITAL_COMMUNITY): Payer: Medicare Other

## 2018-11-07 NOTE — Progress Notes (Deleted)
[place], [place] [address]   CLINIC:  Medical Oncology/Hematology  PCP:  [name], MD [place] [id number] [phone]   REASON FOR VISIT: Follow-up for Stage IA left breast invasive ductal carcinoma; ER+/PR+/HER2-.    CURRENT THERAPY: Arimidex   BRIEF ONCOLOGIC HISTORY:    Breast cancer of upper-inner quadrant of left female breast ([name])   12/15/2014 Mammogram    new 3m round nodule in left breast is indeterminate    12/17/2014 Imaging    6 mm lobulated nodule in the left breast is at low suspicion for malignancy. ultrasound guided biopsy recommended    12/23/2014 Procedure    [facility] Invasive ductal carcinoma.  ER+ (100%), PR+ (99%), HER2 neg (ratio 1.61). Ki67 45%.     01/26/2015 Surgery    (L) lumpectomy with SLNB (Dalbert Batman: IDC, grade 2, spanning 1.1 cm; also low-grade DCIS; margins negative. 0/3 left axillary SLN. HER2 repeated and remains neg. (ratio 1.33).  pT1c, pN0: Stage IA    03/19/2015 Genetic Testing    Genetic testing: Negative. Genes analyzed: ATM, BARD1, BRCA1, BRCA2, BRIP1, CDH1, CHEK2, FANCC, MLH1, MSH2, MSH6, NBN, PALB2, PMS2, PTEN, RAD51C, RAD51D, STK11, TP53, and XRCC2.  Additionally, this panel includes deletion/duplication analysis (without next-generation sequencing) of one gene, EPCAM.    03/2015 - 04/2015 Radiation Therapy    Adjuvant breast radiation with Dr. WPablo Ledger     04/30/2015 Imaging    DEXA scan: Osteopenia. (T-Score of -1.1)    05/2015 -  Anti-estrogen oral therapy    Anastrozole. Planned duration of therapy 5-10 years.     07/16/2015 Miscellaneous    Prolia injections started.      INTERVAL HISTORY:  Ms. BHalder89y.o. female returns for routine follow-up for left breast cancer.    REVIEW OF SYSTEMS:  Review of Systems - Oncology   PAST MEDICAL/SURGICAL HISTORY:  Past Medical History:  Diagnosis Date  . Acute renal failure ([place] 01/27/2015    . Acute respiratory failure ([place] 01/27/2015  . Adenomatous colon polyp 1993  . Allergy   . Anemia   . Anxiety   . Anxiety disorder   . Asthma    Spirometry 1989: FEV1 2.12 (71%) with ratio 82.  HFA 90% after coaching 11-09-10  . Atrial flutter ([facility] chadsvasc of 3 (11/19/14)   Status post cardioversion 2-12 2013. s/p RFCA 5/13 with JAllred (coumadin and amio stopped)   . Breast cancer ([facility]    left  . COPD (chronic obstructive pulmonary disease) ([place]   . DDD (degenerative disc disease)   . Diverticulosis   . Essential hypertension, benign   . Fibromyalgia   . GERD (gastroesophageal reflux disease)    EGD with HH / esophagitis 06-30-93  . Hiatal hernia   . History of atrial fibrillation   . Hyperlipidemia   . Internal and external hemorrhoids without complication   . Lipoma of colon   . Migraine   . Osteoarthritis   . Pelvic fracture ([facility]   . Personal history of colonic polyps 06/14/2010  . Pneumonia   . PONV (postoperative nausea and vomiting)   . Radiation    left breast cancer  . Sleep apnea    cpap   Past Surgical History:  Procedure Laterality Date  . A FLUTTER ABLATION  02/22/2012  . ATRIAL FLUTTER ABLATION N/A 02/22/2012   Procedure: ATRIAL FLUTTER ABLATION;  Surgeon: JThompson Grayer MD;  [facility];  Service: Cardiovascular;  Laterality: N/A;  . BREAST BIOPSY Left 11/2014  . CARDIOVERSION N/A 02/19/2014   Procedure: CARDIOVERSION;  Surgeon: Dorothy Spark, MD;  Location: [facility];  Service: Cardiovascular;  Laterality: N/A;  . COLONOSCOPY  05/2010, 07/2000  . DILATION AND CURETTAGE OF UTERUS    . ESOPHAGOGASTRODUODENOSCOPY  05/2010, 06/1993  . JOINT REPLACEMENT    . POLYPECTOMY    . RADIOACTIVE SEED GUIDED PARTIAL MASTECTOMY WITH AXILLARY SENTINEL LYMPH NODE BIOPSY Left 01/26/2015   Procedure: LEFT  PARTIAL MASTECTOMY WITH  RADIOACTIVE SEED LOCALIZATION, LEFT AXILLARY SENTINEL LYMPH NODE BIOPSY;  Surgeon: Fanny Skates, MD;  Location: [facility];  Service: General;   Laterality: Left;  . ROTATOR CUFF REPAIR     bilateral  . ROTATOR CUFF REPAIR     bil  . TOTAL KNEE ARTHROPLASTY     x2 09/2006  . UPPER GASTROINTESTINAL ENDOSCOPY       SOCIAL HISTORY:  Social History   Socioeconomic History  . Marital status: Widowed    Spouse name: Not on file  . Number of children: 0  . Years of education: Not on file  . Highest education level: Not on file  Occupational History  . Occupation: Database administrator at [employer]  . Financial resource strain: Not on file  . Food insecurity:    Worry: Not on file    Inability: Not on file  . Transportation needs:    Medical: Not on file    Non-medical: Not on file  Tobacco Use  . Smoking status: Former Smoker    Packs/day: 1.00    Years: 20.00    Pack years: 20.00    Types: Cigarettes    Last attempt to quit: 09/25/1984    Years since quitting: 34.1  . Smokeless tobacco: Never Used  Substance and Sexual Activity  . Alcohol use: No    Alcohol/week: 0.0 standard drinks  . Drug use: No  . Sexual activity: Not Currently  Lifestyle  . Physical activity:    Days per week: Not on file    Minutes per session: Not on file  . Stress: Not on file  Relationships  . Social connections:    Talks on phone: Not on file    Gets together: Not on file    Attends religious service: Not on file    Active member of club or organization: Not on file    Attends meetings of clubs or organizations: Not on file    Relationship status: Not on file  . Intimate partner violence:    Fear of current or ex partner: Not on file    Emotionally abused: Not on file    Physically abused: Not on file    Forced sexual activity: Not on file  Other Topics Concern  . Not on file  Social History Narrative  . Not on file    FAMILY HISTORY:  Family History  Problem Relation Age of Onset  . Emphysema Father        smoker  . Heart disease Father   . Breast cancer Mother 15  . Breast cancer Sister  43  . Heart Problems Sister   . Heart disease Sister   . Breast cancer Maternal Aunt 48  . Lung cancer Maternal Uncle 78       smoker  . Colon cancer Paternal Uncle 4  . Diabetes Maternal Grandmother   . Heart Problems Maternal Grandfather   . Stroke Paternal Grandfather   .  Lung cancer Maternal Uncle 72       smoker  . Heart Problems Paternal Uncle   . Diabetes Paternal Grandmother   . Rectal cancer Neg Hx   . Stomach cancer Neg Hx   . Esophageal cancer Neg Hx     CURRENT MEDICATIONS:  Outpatient Encounter Medications as of 11/07/2018  Medication Sig Note  . albuterol (PROVENTIL) (2.5 MG/3ML) 0.083% nebulizer solution Take 3 mLs (2.5 mg total) by nebulization every 4 (four) hours as needed for wheezing or shortness of breath (dx: J45.991).   . ALPRAZolam (XANAX) 0.5 MG tablet Take 0.5 mg by mouth 2 (two) times daily as needed for anxiety.  02/19/2015: Received from: External Pharmacy  . anastrozole (ARIMIDEX) 1 MG tablet Take 1 tablet (1 mg total) by mouth daily.   Marland Kitchen atorvastatin (LIPITOR) 10 MG tablet Take 10 mg by mouth daily. 06/03/2015: Received from: External Pharmacy  . benzonatate (TESSALON) 200 MG capsule Take 1 capsule (200 mg total) by mouth 3 (three) times daily as needed for cough.   . bisoprolol (ZEBETA) 10 MG tablet Take 1 tablet (10 mg total) by mouth daily.   . budesonide-formoterol (SYMBICORT) 160-4.5 MCG/ACT inhaler Inhale 2 puffs into the lungs 2 (two) times daily.   . budesonide-formoterol (SYMBICORT) 160-4.5 MCG/ACT inhaler Inhale 2 puffs into the lungs 2 (two) times daily.   . calcium-vitamin D (OSCAL WITH D) 500-200 MG-UNIT per tablet Take 1 tablet by mouth daily with breakfast. (Patient taking differently: Take 2 tablets by mouth daily with breakfast. )   . CARTIA XT 180 MG 24 hr capsule take 1 capsule by mouth once daily   . celecoxib (CELEBREX) 200 MG capsule Take 1 capsule by mouth every 3-4 days 04/26/2015: Received from: External Pharmacy Received Sig:   .  cyanocobalamin (,VITAMIN B-12,) 1000 MCG/ML injection Inject 1 mL (1,000 mcg total) into the muscle every 30 (thirty) days.   . cyclobenzaprine (FLEXERIL) 10 MG tablet Take 1 tablet by mouth 3 (three) times daily. As needed 02/01/2016: Received from: External Pharmacy Received Sig: take 1 tablet by mouth three times a day if needed for SPASMS  . flecainide (TAMBOCOR) 50 MG tablet TAKE 1 TABLET BY MOUTH TWICE DAILY   . fluticasone (FLONASE) 50 MCG/ACT nasal spray Place 2 sprays into both nostrils daily as needed for allergies.    . furosemide (LASIX) 40 MG tablet Take 40 mg by mouth 3 (three) times a week.  04/26/2015: Received from: External Pharmacy  . GARCINIA CAMBOGIA-CHROMIUM PO Take 1 capsule by mouth daily.    . hydrOXYzine (ATARAX/VISTARIL) 25 MG tablet Take 1 tablet by mouth every 6 (six) hours as needed. Itching 04/26/2015: Received from: External Pharmacy Received Sig: take 1 tablet by mouth every 6 hours if needed for itching  . losartan (COZAAR) 100 MG tablet Take 100 mg by mouth daily.  02/19/2015: Received from: External Pharmacy  . metFORMIN (GLUCOPHAGE) 500 MG tablet 1 tablet 2 (two) times daily with a meal.    . nystatin (NYAMYC) powder Apply topically 2 (two) times daily as needed.   Marland Kitchen omeprazole (PRILOSEC) 40 MG capsule take 1 capsule by mouth once daily BEFORE BREAKFAST   . PARoxetine (PAXIL) 20 MG tablet Take 20 mg by mouth daily.   . potassium chloride (K-DUR) 10 MEQ tablet Take 10 mEq by mouth daily.  04/26/2015: Received from: External Pharmacy Received Sig:   . PROAIR RESPICLICK 301 (90 Base) MCG/ACT AEPB INHALE 2 PUFFS BY MOUTH EVERY 4 HOURS IF NEEDED   .  rivaroxaban (XARELTO) 20 MG TABS tablet TAKE 1 TABLET BY MOUTH WITH SUPPER   . temazepam (RESTORIL) 30 MG capsule Take 1 capsule (30 mg total) by mouth at bedtime as needed for sleep.   Alveda Reasons 20 MG TABS tablet TAKE 1 TABLET BY MOUTH EVERY DAY WITH SUPPER    Facility-Administered Encounter Medications as of 11/07/2018    Medication  . 0.9 %  sodium chloride infusion    ALLERGIES:  Allergies  Allergen Reactions  . Shellfish Allergy Hives and Shortness Of Breath  . Ace Inhibitors Cough  . Aminophylline Nausea And Vomiting    Heart races and pounds  . Epinephrine Other (See Comments)    Pallpitations during dental procedures  . Theophyllines Nausea And Vomiting and Other (See Comments)    Heart races and pounds  . Propoxyphene Hcl Nausea And Vomiting     PHYSICAL EXAM:  ECOG Performance status: ***  There were no vitals filed for this visit. There were no vitals filed for this visit.  Physical Exam   LABORATORY DATA:  I have reviewed the labs as listed.  CBC    Component Value Date/Time   WBC 7.3 04/17/2018 0911   RBC 3.62 (L) 04/17/2018 0911   HGB 10.6 (L) 04/17/2018 0911   HCT 34.2 (L) 04/17/2018 0911   PLT 313 04/17/2018 0911   MCV 94.5 04/17/2018 0911   MCH 29.3 04/17/2018 0911   MCHC 31.0 04/17/2018 0911   RDW 14.9 04/17/2018 0911   LYMPHSABS 1.3 04/17/2018 0911   MONOABS 0.5 04/17/2018 0911   EOSABS 0.2 04/17/2018 0911   BASOSABS 0.1 04/17/2018 0911   CMP Latest Ref Rng & Units 04/17/2018 10/10/2017 01/18/2017  Glucose 70 - 99 mg/dL 125(H) 141(H) 169(H)  BUN 8 - 23 mg/dL 26(H) 19 20  Creatinine 0.44 - 1.00 mg/dL 1.50(H) 1.16(H) 1.30(H)  Sodium 135 - 145 mmol/L 140 137 137  Potassium 3.5 - 5.1 mmol/L 3.8 3.7 3.6  Chloride 98 - 111 mmol/L 97(L) 94(L) 99(L)  CO2 22 - 32 mmol/L 30 29 30   Calcium 8.9 - 10.3 mg/dL 9.4 9.5 9.2  Total Protein 6.5 - 8.1 g/dL 7.8 8.4(H) 7.7  Total Bilirubin 0.3 - 1.2 mg/dL 0.3 0.5 0.5  Alkaline Phos 38 - 126 U/L 53 67 64  AST 15 - 41 U/L 18 17 25   ALT 0 - 44 U/L 17 13(L) 27       DIAGNOSTIC IMAGING:  I have independently reviewed the scans and discussed with the patient.   I have reviewed Francene Finders, NP's note and agree with the documentation.  I personally performed a face-to-face visit, made revisions and my assessment and plan is as  follows.    ASSESSMENT & PLAN:   No problem-specific Assessment & Plan notes found for this encounter.      Orders placed this encounter:  No orders of the defined types were placed in this encounter.     Derek Jack, MD [place] [phone]

## 2018-11-13 ENCOUNTER — Inpatient Hospital Stay (HOSPITAL_COMMUNITY): Payer: Medicare Other

## 2018-11-13 ENCOUNTER — Encounter (HOSPITAL_COMMUNITY): Payer: Self-pay

## 2018-11-13 ENCOUNTER — Other Ambulatory Visit (HOSPITAL_COMMUNITY): Payer: Self-pay | Admitting: Nurse Practitioner

## 2018-11-13 ENCOUNTER — Inpatient Hospital Stay (HOSPITAL_COMMUNITY): Payer: Medicare Other | Attending: Hematology

## 2018-11-13 VITALS — BP 134/72 | HR 103 | Temp 98.2°F | Resp 20

## 2018-11-13 DIAGNOSIS — Z170 Estrogen receptor positive status [ER+]: Principal | ICD-10-CM

## 2018-11-13 DIAGNOSIS — C50212 Malignant neoplasm of upper-inner quadrant of left female breast: Secondary | ICD-10-CM | POA: Diagnosis present

## 2018-11-13 DIAGNOSIS — M8580 Other specified disorders of bone density and structure, unspecified site: Secondary | ICD-10-CM

## 2018-11-13 DIAGNOSIS — Z79899 Other long term (current) drug therapy: Secondary | ICD-10-CM

## 2018-11-13 DIAGNOSIS — E538 Deficiency of other specified B group vitamins: Secondary | ICD-10-CM | POA: Diagnosis present

## 2018-11-13 DIAGNOSIS — M8588 Other specified disorders of bone density and structure, other site: Secondary | ICD-10-CM

## 2018-11-13 LAB — CBC WITH DIFFERENTIAL/PLATELET
Abs Immature Granulocytes: 0.02 10*3/uL (ref 0.00–0.07)
Basophils Absolute: 0.1 10*3/uL (ref 0.0–0.1)
Basophils Relative: 1 %
EOS ABS: 0.4 10*3/uL (ref 0.0–0.5)
EOS PCT: 5 %
HEMATOCRIT: 30.7 % — AB (ref 36.0–46.0)
HEMOGLOBIN: 8.9 g/dL — AB (ref 12.0–15.0)
IMMATURE GRANULOCYTES: 0 %
LYMPHS ABS: 1 10*3/uL (ref 0.7–4.0)
LYMPHS PCT: 15 %
MCH: 26.9 pg (ref 26.0–34.0)
MCHC: 29 g/dL — AB (ref 30.0–36.0)
MCV: 92.7 fL (ref 80.0–100.0)
MONOS PCT: 7 %
Monocytes Absolute: 0.5 10*3/uL (ref 0.1–1.0)
Neutro Abs: 4.8 10*3/uL (ref 1.7–7.7)
Neutrophils Relative %: 72 %
Platelets: 340 10*3/uL (ref 150–400)
RBC: 3.31 MIL/uL — ABNORMAL LOW (ref 3.87–5.11)
RDW: 15.9 % — AB (ref 11.5–15.5)
WBC: 6.7 10*3/uL (ref 4.0–10.5)
nRBC: 0 % (ref 0.0–0.2)

## 2018-11-13 LAB — COMPREHENSIVE METABOLIC PANEL
ALK PHOS: 55 U/L (ref 38–126)
ALT: 14 U/L (ref 0–44)
AST: 16 U/L (ref 15–41)
Albumin: 3.9 g/dL (ref 3.5–5.0)
Anion gap: 11 (ref 5–15)
BILIRUBIN TOTAL: 0.5 mg/dL (ref 0.3–1.2)
BUN: 23 mg/dL (ref 8–23)
CALCIUM: 9.4 mg/dL (ref 8.9–10.3)
CO2: 25 mmol/L (ref 22–32)
Chloride: 102 mmol/L (ref 98–111)
Creatinine, Ser: 1.3 mg/dL — ABNORMAL HIGH (ref 0.44–1.00)
GFR calc Af Amer: 48 mL/min — ABNORMAL LOW (ref >60)
GFR, EST NON AFRICAN AMERICAN: 42 mL/min — AB (ref >60)
GLUCOSE: 131 mg/dL — AB (ref 70–99)
POTASSIUM: 4.3 mmol/L (ref 3.5–5.1)
Sodium: 138 mmol/L (ref 135–145)
TOTAL PROTEIN: 7.5 g/dL (ref 6.5–8.1)

## 2018-11-13 MED ORDER — DENOSUMAB 60 MG/ML ~~LOC~~ SOSY
60.0000 mg | PREFILLED_SYRINGE | Freq: Once | SUBCUTANEOUS | Status: AC
  Administered 2018-11-13: 60 mg via SUBCUTANEOUS
  Filled 2018-11-13: qty 1

## 2018-11-13 MED ORDER — CYANOCOBALAMIN 1000 MCG/ML IJ SOLN
1000.0000 ug | Freq: Once | INTRAMUSCULAR | Status: AC
  Administered 2018-11-13: 1000 ug via INTRAMUSCULAR
  Filled 2018-11-13: qty 1

## 2018-11-13 NOTE — Patient Instructions (Signed)
[place] at [facility] Discharge Instructions  Received Prolia and Vit B12 injections today. Follow-up as scheduled. Call clinic for any questions or concerns   Thank you for choosing [place] at [facility] to provide your oncology and hematology care.  To afford each patient quality time with our provider, please arrive at least 15 minutes before your scheduled appointment time.   If you have a lab appointment with the [facility] please come in thru the  Main Entrance and check in at the main information desk  You need to re-schedule your appointment should you arrive 10 or more minutes late.  We strive to give you quality time with our providers, and arriving late affects you and other patients whose appointments are after yours.  Also, if you no show three or more times for appointments you may be dismissed from the clinic at the providers discretion.     Again, thank you for choosing [facility].  Our hope is that these requests will decrease the amount of time that you wait before being seen by our physicians.       _____________________________________________________________  Should you have questions after your visit to [facility], please contact our office at [phone] between the hours of 8:00 a.m. and 4:30 p.m.  Voicemails left after 4:00 p.m. will not be returned until the following business day.  For prescription refill requests, have your pharmacy contact our office and allow 72 hours.    Cancer Center Support Programs:   > Cancer Support Group  2nd Tuesday of the month 1pm-2pm, Journey Room

## 2018-11-13 NOTE — Progress Notes (Signed)
[name] tolerated Prolia and Vit B12 injections well without complaints or incident. Calcium 9.4 today and pt denied any tooth or jaw pain and no recent or future dental visits prior to administering the Prolia injection. Vit B12 restarted per Francene Finders NP and pt was rescheduled for MD appt which she missed last week. MD to decide about continued Vit B12 injections at that visit per NP. VSS Pt discharged via walker in satisfactory condition

## 2018-11-14 ENCOUNTER — Ambulatory Visit (HOSPITAL_COMMUNITY): Payer: Medicare Other | Admitting: Hematology

## 2018-11-14 ENCOUNTER — Ambulatory Visit (HOSPITAL_COMMUNITY): Payer: Medicare Other

## 2018-11-14 ENCOUNTER — Other Ambulatory Visit (HOSPITAL_COMMUNITY): Payer: Medicare Other

## 2018-11-18 ENCOUNTER — Other Ambulatory Visit (HOSPITAL_COMMUNITY): Payer: Self-pay | Admitting: *Deleted

## 2018-11-18 ENCOUNTER — Other Ambulatory Visit (HOSPITAL_COMMUNITY): Payer: Self-pay | Admitting: Nurse Practitioner

## 2018-11-18 DIAGNOSIS — C50212 Malignant neoplasm of upper-inner quadrant of left female breast: Secondary | ICD-10-CM

## 2018-11-18 DIAGNOSIS — Z170 Estrogen receptor positive status [ER+]: Principal | ICD-10-CM

## 2018-11-18 MED ORDER — ANASTROZOLE 1 MG PO TABS: 1.0000 mg | ORAL_TABLET | Freq: Every day | ORAL | 0 refills | Status: DC

## 2018-11-18 NOTE — Telephone Encounter (Signed)
Patient did not show up for her January follow-up and was rescheduled for March.  Patient given one refill on anastrozole and will be given more when she comes for follow-up.

## 2018-11-25 ENCOUNTER — Ambulatory Visit (HOSPITAL_COMMUNITY): Payer: Medicare Other | Admitting: Nurse Practitioner

## 2018-11-29 ENCOUNTER — Ambulatory Visit: Payer: Medicare Other | Admitting: Adult Health

## 2018-12-11 ENCOUNTER — Ambulatory Visit (HOSPITAL_COMMUNITY): Payer: Medicare Other | Admitting: Nurse Practitioner

## 2018-12-11 ENCOUNTER — Other Ambulatory Visit (HOSPITAL_COMMUNITY): Payer: Medicare Other

## 2018-12-17 ENCOUNTER — Other Ambulatory Visit (HOSPITAL_COMMUNITY): Payer: Self-pay | Admitting: Nurse Practitioner

## 2018-12-17 DIAGNOSIS — Z170 Estrogen receptor positive status [ER+]: Principal | ICD-10-CM

## 2018-12-17 DIAGNOSIS — C50212 Malignant neoplasm of upper-inner quadrant of left female breast: Secondary | ICD-10-CM

## 2018-12-23 ENCOUNTER — Ambulatory Visit: Payer: Medicare Other | Admitting: Adult Health

## 2018-12-24 ENCOUNTER — Encounter: Payer: Self-pay | Admitting: Internal Medicine

## 2019-01-13 ENCOUNTER — Other Ambulatory Visit (HOSPITAL_COMMUNITY): Payer: Medicare Other

## 2019-01-20 ENCOUNTER — Ambulatory Visit (HOSPITAL_COMMUNITY): Payer: Medicare Other | Admitting: Nurse Practitioner

## 2019-01-21 ENCOUNTER — Other Ambulatory Visit (HOSPITAL_COMMUNITY): Payer: Self-pay | Admitting: Nurse Practitioner

## 2019-01-21 ENCOUNTER — Ambulatory Visit: Payer: Medicare Other | Admitting: Adult Health

## 2019-01-21 DIAGNOSIS — I480 Paroxysmal atrial fibrillation: Secondary | ICD-10-CM

## 2019-01-22 NOTE — Telephone Encounter (Signed)
lft msg for pt to call back to sched f/u.  Pt has been sent recall and is due for 9 month f/u and renewal of flecainide.

## 2019-01-24 ENCOUNTER — Inpatient Hospital Stay (HOSPITAL_COMMUNITY): Payer: Medicare Other | Attending: Hematology

## 2019-01-24 ENCOUNTER — Other Ambulatory Visit: Payer: Self-pay

## 2019-01-24 DIAGNOSIS — Z808 Family history of malignant neoplasm of other organs or systems: Secondary | ICD-10-CM | POA: Insufficient documentation

## 2019-01-24 DIAGNOSIS — Z803 Family history of malignant neoplasm of breast: Secondary | ICD-10-CM | POA: Insufficient documentation

## 2019-01-24 DIAGNOSIS — Z923 Personal history of irradiation: Secondary | ICD-10-CM | POA: Diagnosis not present

## 2019-01-24 DIAGNOSIS — M8580 Other specified disorders of bone density and structure, unspecified site: Secondary | ICD-10-CM | POA: Diagnosis present

## 2019-01-24 DIAGNOSIS — C50212 Malignant neoplasm of upper-inner quadrant of left female breast: Secondary | ICD-10-CM | POA: Insufficient documentation

## 2019-01-24 DIAGNOSIS — Z800 Family history of malignant neoplasm of digestive organs: Secondary | ICD-10-CM | POA: Insufficient documentation

## 2019-01-24 DIAGNOSIS — Z801 Family history of malignant neoplasm of trachea, bronchus and lung: Secondary | ICD-10-CM | POA: Insufficient documentation

## 2019-01-24 DIAGNOSIS — E538 Deficiency of other specified B group vitamins: Secondary | ICD-10-CM | POA: Insufficient documentation

## 2019-01-24 DIAGNOSIS — Z87891 Personal history of nicotine dependence: Secondary | ICD-10-CM | POA: Diagnosis not present

## 2019-01-24 DIAGNOSIS — I4892 Unspecified atrial flutter: Secondary | ICD-10-CM | POA: Diagnosis not present

## 2019-01-24 DIAGNOSIS — Z79811 Long term (current) use of aromatase inhibitors: Secondary | ICD-10-CM | POA: Diagnosis not present

## 2019-01-24 DIAGNOSIS — Z170 Estrogen receptor positive status [ER+]: Secondary | ICD-10-CM | POA: Diagnosis not present

## 2019-01-24 DIAGNOSIS — E559 Vitamin D deficiency, unspecified: Secondary | ICD-10-CM | POA: Diagnosis not present

## 2019-01-24 DIAGNOSIS — D649 Anemia, unspecified: Secondary | ICD-10-CM | POA: Diagnosis not present

## 2019-01-24 DIAGNOSIS — Z7901 Long term (current) use of anticoagulants: Secondary | ICD-10-CM | POA: Insufficient documentation

## 2019-01-24 LAB — COMPREHENSIVE METABOLIC PANEL
ALT: 13 U/L (ref 0–44)
AST: 14 U/L — ABNORMAL LOW (ref 15–41)
Albumin: 3.9 g/dL (ref 3.5–5.0)
Alkaline Phosphatase: 54 U/L (ref 38–126)
Anion gap: 9 (ref 5–15)
BUN: 26 mg/dL — ABNORMAL HIGH (ref 8–23)
CO2: 25 mmol/L (ref 22–32)
Calcium: 9.2 mg/dL (ref 8.9–10.3)
Chloride: 104 mmol/L (ref 98–111)
Creatinine, Ser: 1.11 mg/dL — ABNORMAL HIGH (ref 0.44–1.00)
GFR calc Af Amer: 58 mL/min — ABNORMAL LOW (ref >60)
GFR calc non Af Amer: 50 mL/min — ABNORMAL LOW (ref >60)
Glucose, Bld: 106 mg/dL — ABNORMAL HIGH (ref 70–99)
Potassium: 4.5 mmol/L (ref 3.5–5.1)
Sodium: 138 mmol/L (ref 135–145)
Total Bilirubin: 0.3 mg/dL (ref 0.3–1.2)
Total Protein: 7.4 g/dL (ref 6.5–8.1)

## 2019-01-24 LAB — CBC WITH DIFFERENTIAL/PLATELET
Abs Immature Granulocytes: 0.03 10*3/uL (ref 0.00–0.07)
Basophils Absolute: 0.1 10*3/uL (ref 0.0–0.1)
Basophils Relative: 1 %
Eosinophils Absolute: 0.3 10*3/uL (ref 0.0–0.5)
Eosinophils Relative: 4 %
HCT: 29.7 % — ABNORMAL LOW (ref 36.0–46.0)
Hemoglobin: 8.7 g/dL — ABNORMAL LOW (ref 12.0–15.0)
Immature Granulocytes: 0 %
Lymphocytes Relative: 19 %
Lymphs Abs: 1.3 10*3/uL (ref 0.7–4.0)
MCH: 26.4 pg (ref 26.0–34.0)
MCHC: 29.3 g/dL — ABNORMAL LOW (ref 30.0–36.0)
MCV: 90.3 fL (ref 80.0–100.0)
Monocytes Absolute: 0.4 10*3/uL (ref 0.1–1.0)
Monocytes Relative: 6 %
Neutro Abs: 4.7 10*3/uL (ref 1.7–7.7)
Neutrophils Relative %: 70 %
Platelets: 397 10*3/uL (ref 150–400)
RBC: 3.29 MIL/uL — ABNORMAL LOW (ref 3.87–5.11)
RDW: 17 % — ABNORMAL HIGH (ref 11.5–15.5)
WBC: 6.7 10*3/uL (ref 4.0–10.5)
nRBC: 0 % (ref 0.0–0.2)

## 2019-01-24 LAB — FERRITIN: Ferritin: 40 ng/mL (ref 11–307)

## 2019-01-24 LAB — VITAMIN B12: Vitamin B-12: 185 pg/mL (ref 180–914)

## 2019-01-24 LAB — IRON AND TIBC: Iron: 61 ug/dL (ref 28–170)

## 2019-01-24 LAB — FOLATE: Folate: 6.4 ng/mL (ref >5.9)

## 2019-01-24 LAB — LACTATE DEHYDROGENASE: LDH: 115 U/L (ref 98–192)

## 2019-01-25 LAB — VITAMIN D 25 HYDROXY (VIT D DEFICIENCY, FRACTURES): Vit D, 25-Hydroxy: 29.5 ng/mL — ABNORMAL LOW (ref 30.0–100.0)

## 2019-01-31 ENCOUNTER — Other Ambulatory Visit: Payer: Self-pay

## 2019-01-31 ENCOUNTER — Inpatient Hospital Stay (HOSPITAL_BASED_OUTPATIENT_CLINIC_OR_DEPARTMENT_OTHER): Payer: Medicare Other | Admitting: Nurse Practitioner

## 2019-01-31 VITALS — BP 154/63 | HR 77 | Temp 98.7°F | Resp 16 | Wt 254.1 lb

## 2019-01-31 DIAGNOSIS — C50212 Malignant neoplasm of upper-inner quadrant of left female breast: Secondary | ICD-10-CM | POA: Diagnosis not present

## 2019-01-31 DIAGNOSIS — M8580 Other specified disorders of bone density and structure, unspecified site: Secondary | ICD-10-CM

## 2019-01-31 DIAGNOSIS — Z923 Personal history of irradiation: Secondary | ICD-10-CM

## 2019-01-31 DIAGNOSIS — D649 Anemia, unspecified: Secondary | ICD-10-CM

## 2019-01-31 DIAGNOSIS — Z170 Estrogen receptor positive status [ER+]: Secondary | ICD-10-CM | POA: Diagnosis not present

## 2019-01-31 DIAGNOSIS — I4892 Unspecified atrial flutter: Secondary | ICD-10-CM

## 2019-01-31 DIAGNOSIS — E538 Deficiency of other specified B group vitamins: Secondary | ICD-10-CM

## 2019-01-31 DIAGNOSIS — E559 Vitamin D deficiency, unspecified: Secondary | ICD-10-CM

## 2019-01-31 DIAGNOSIS — D631 Anemia in chronic kidney disease: Secondary | ICD-10-CM

## 2019-01-31 DIAGNOSIS — Z7901 Long term (current) use of anticoagulants: Secondary | ICD-10-CM

## 2019-01-31 DIAGNOSIS — G4700 Insomnia, unspecified: Secondary | ICD-10-CM

## 2019-01-31 DIAGNOSIS — Z79811 Long term (current) use of aromatase inhibitors: Secondary | ICD-10-CM

## 2019-01-31 MED ORDER — CYANOCOBALAMIN 1000 MCG/ML IJ SOLN
1000.0000 ug | Freq: Once | INTRAMUSCULAR | Status: AC
  Administered 2019-01-31: 1000 ug via INTRAMUSCULAR

## 2019-01-31 MED ORDER — TEMAZEPAM 30 MG PO CAPS: 30.0000 mg | ORAL_CAPSULE | Freq: Every evening | ORAL | 0 refills | Status: DC | PRN

## 2019-01-31 NOTE — Progress Notes (Signed)
[place], [place] [address]   CLINIC:  Medical Oncology/Hematology  PCP:  [name], MD [place] [address] [phone]   REASON FOR VISIT: Follow-up for left breast cancer  CURRENT THERAPY: Anastrozole daily  BRIEF ONCOLOGIC HISTORY:    Breast cancer of upper-inner quadrant of left female breast ([facility])   12/15/2014 Mammogram    new 51m round nodule in left breast is indeterminate    12/17/2014 Imaging    6 mm lobulated nodule in the left breast is at low suspicion for malignancy. ultrasound guided biopsy recommended    12/23/2014 Procedure    [facility]: Invasive ductal carcinoma.  ER+ (100%), PR+ (99%), HER2 neg (ratio 1.61). Ki67 45%.     01/26/2015 Surgery    (L) lumpectomy with SLNB (Dalbert Batman: IDC, grade 2, spanning 1.1 cm; also low-grade DCIS; margins negative. 0/3 left axillary SLN. HER2 repeated and remains neg. (ratio 1.33).  pT1c, pN0: Stage IA    03/19/2015 Genetic Testing    Genetic testing: Negative. Genes analyzed: ATM, BARD1, BRCA1, BRCA2, BRIP1, CDH1, CHEK2, FANCC, MLH1, MSH2, MSH6, NBN, PALB2, PMS2, PTEN, RAD51C, RAD51D, STK11, TP53, and XRCC2.  Additionally, this panel includes deletion/duplication analysis (without next-generation sequencing) of one gene, EPCAM.    03/2015 - 04/2015 Radiation Therapy    Adjuvant breast radiation with Dr. WPablo Ledger     04/30/2015 Imaging    DEXA scan: Osteopenia. (T-Score of -1.1)    05/2015 -  Anti-estrogen oral therapy    Anastrozole. Planned duration of therapy 5-10 years.     07/16/2015 Miscellaneous    Prolia injections started.      INTERVAL HISTORY:  [name]89y.o. female returns for routine follow-up for breast cancer.  She reports fatigue, dizziness and headaches.  She reports she fell a month ago but did not injure herself was just bruised.  She also has problems sleeping at night.  Denies any nausea, vomiting, or diarrhea.  Denies any new pains. Had not noticed any recent bleeding such as epistaxis, hematuria or hematochezia. Denies recent chest pain on exertion, shortness of breath on minimal exertion, pre-syncopal episodes, or palpitations. Denies any numbness or tingling in hands or feet. Denies any recent fevers, infections, or recent hospitalizations. Patient reports appetite at 75% and energy level at 50%.  He is eating well and maintaining her weight at this time.    REVIEW OF SYSTEMS:  Review of Systems  Constitutional: Positive for fatigue.  Respiratory: Positive for shortness of breath.   Musculoskeletal: Positive for gait problem.  Neurological: Positive for dizziness, gait problem, headaches and numbness.  Psychiatric/Behavioral: Positive for sleep disturbance.  All other systems reviewed and are negative.    PAST MEDICAL/SURGICAL HISTORY:  Past Medical History:  Diagnosis Date   Acute renal failure ([facility] 01/27/2015   Acute respiratory failure ([place] 01/27/2015   Adenomatous colon polyp 1993   Allergy    Anemia    Anxiety    Anxiety disorder    Asthma    Spirometry 1989: FEV1 2.12 (71%) with ratio 82.  HFA 90% after coaching 11-09-10   Atrial flutter ([place] chadsvasc of 3 (11/19/14)   Status post cardioversion 2-12 2013. s/p RFCA 5/13 with JAllred (coumadin and amio stopped)    Breast cancer (HCC)    left   COPD (chronic obstructive pulmonary disease) (HCC)    DDD (degenerative disc disease)    Diverticulosis    Essential hypertension,  benign    Fibromyalgia    GERD (gastroesophageal reflux disease)    EGD with HH / esophagitis 06-30-93   Hiatal hernia    History of atrial fibrillation    Hyperlipidemia    Internal and external hemorrhoids without complication    Lipoma of colon    Migraine    Osteoarthritis    Pelvic fracture (HCC)    Personal history of colonic polyps 06/14/2010   Pneumonia    PONV (postoperative nausea and vomiting)    Radiation    left  breast cancer   Sleep apnea    cpap   Past Surgical History:  Procedure Laterality Date   A FLUTTER ABLATION  02/22/2012   ATRIAL FLUTTER ABLATION N/A 02/22/2012   Procedure: ATRIAL FLUTTER ABLATION;  Surgeon: Thompson Grayer, MD;  Location: [facility];  Service: Cardiovascular;  Laterality: N/A;   BREAST BIOPSY Left 11/2014   CARDIOVERSION N/A 02/19/2014   Procedure: CARDIOVERSION;  Surgeon: Dorothy Spark, MD;  Location: [facility];  Service: Cardiovascular;  Laterality: N/A;   COLONOSCOPY  05/2010, 07/2000   DILATION AND CURETTAGE OF UTERUS     ESOPHAGOGASTRODUODENOSCOPY  05/2010, 06/1993   JOINT REPLACEMENT     POLYPECTOMY     RADIOACTIVE SEED GUIDED PARTIAL MASTECTOMY WITH AXILLARY SENTINEL LYMPH NODE BIOPSY Left 01/26/2015   Procedure: LEFT  PARTIAL MASTECTOMY WITH  RADIOACTIVE SEED LOCALIZATION, LEFT AXILLARY SENTINEL LYMPH NODE BIOPSY;  Surgeon: Fanny Skates, MD;  Location: [facility];  Service: General;  Laterality: Left;   ROTATOR CUFF REPAIR     bilateral   ROTATOR CUFF REPAIR     bil   TOTAL KNEE ARTHROPLASTY     x2 09/2006   UPPER GASTROINTESTINAL ENDOSCOPY       SOCIAL HISTORY:  Social History   Socioeconomic History   Marital status: Widowed    Spouse name: Not on file   Number of children: 0   Years of education: Not on file   Highest education level: Not on file  Occupational History   Occupation: Database administrator at [employer] resource strain: Not on file   Food insecurity:    Worry: Not on file    Inability: Not on file   Transportation needs:    Medical: Not on file    Non-medical: Not on file  Tobacco Use   Smoking status: Former Smoker    Packs/day: 1.00    Years: 20.00    Pack years: 20.00    Types: Cigarettes    Last attempt to quit: 09/25/1984    Years since quitting: 34.3   Smokeless tobacco: Never Used  Substance and Sexual Activity   Alcohol use: No    Alcohol/week: 0.0 standard  drinks   Drug use: No   Sexual activity: Not Currently  Lifestyle   Physical activity:    Days per week: Not on file    Minutes per session: Not on file   Stress: Not on file  Relationships   Social connections:    Talks on phone: Not on file    Gets together: Not on file    Attends religious service: Not on file    Active member of club or organization: Not on file    Attends meetings of clubs or organizations: Not on file    Relationship status: Not on file   Intimate partner violence:    Fear of current or ex partner: Not on file  Emotionally abused: Not on file    Physically abused: Not on file    Forced sexual activity: Not on file  Other Topics Concern   Not on file  Social History Narrative   Not on file    FAMILY HISTORY:  Family History  Problem Relation Age of Onset   Emphysema Father        smoker   Heart disease Father    Breast cancer Mother 102   Breast cancer Sister 23   Heart Problems Sister    Heart disease Sister    Breast cancer Maternal Aunt 12   Lung cancer Maternal Uncle 64       smoker   Colon cancer Paternal Uncle 73   Diabetes Maternal Grandmother    Heart Problems Maternal Grandfather    Stroke Paternal Grandfather    Lung cancer Maternal Uncle 72       smoker   Heart Problems Paternal Uncle    Diabetes Paternal Grandmother    Rectal cancer Neg Hx    Stomach cancer Neg Hx    Esophageal cancer Neg Hx     CURRENT MEDICATIONS:  Outpatient Encounter Medications as of 01/31/2019  Medication Sig Note   albuterol (PROVENTIL) (2.5 MG/3ML) 0.083% nebulizer solution Take 3 mLs (2.5 mg total) by nebulization every 4 (four) hours as needed for wheezing or shortness of breath (dx: J45.991).    ALPRAZolam (XANAX) 0.5 MG tablet Take 0.5 mg by mouth 2 (two) times daily as needed for anxiety.  02/19/2015: Received from: External Pharmacy   anastrozole (ARIMIDEX) 1 MG tablet TAKE 1 TABLET BY MOUTH EVERY DAY(NEED APPOINTMENT  FOR MORE REFILLS)    atorvastatin (LIPITOR) 10 MG tablet Take 10 mg by mouth daily. 06/03/2015: Received from: External Pharmacy   bisoprolol (ZEBETA) 10 MG tablet Take 1 tablet (10 mg total) by mouth daily.    budesonide-formoterol (SYMBICORT) 160-4.5 MCG/ACT inhaler Inhale 2 puffs into the lungs 2 (two) times daily.    budesonide-formoterol (SYMBICORT) 80-4.5 MCG/ACT inhaler Symbicort 80 mcg-4.5 mcg/actuation HFA aerosol inhaler  INHALE 2 PUFFS BY MOUTH TWICE A DAY    calcium-vitamin D (OSCAL WITH D) 500-200 MG-UNIT per tablet Take 1 tablet by mouth daily with breakfast. (Patient taking differently: Take 2 tablets by mouth daily with breakfast. )    CARTIA XT 180 MG 24 hr capsule take 1 capsule by mouth once daily    celecoxib (CELEBREX) 200 MG capsule Take 1 capsule by mouth every 3-4 days 04/26/2015: Received from: External Pharmacy Received Sig:    cyanocobalamin (,VITAMIN B-12,) 1000 MCG/ML injection Inject 1 mL (1,000 mcg total) into the muscle every 30 (thirty) days.    flecainide (TAMBOCOR) 50 MG tablet TAKE 1 TABLET BY MOUTH TWICE DAILY    GARCINIA CAMBOGIA-CHROMIUM PO Take 1 capsule by mouth daily.     HYDROMET 5-1.5 MG/5ML syrup TK 5 MLS TID PRN FOR COUGH    hydrOXYzine (ATARAX/VISTARIL) 25 MG tablet Take 1 tablet by mouth every 6 (six) hours as needed. Itching 04/26/2015: Received from: External Pharmacy Received Sig: take 1 tablet by mouth every 6 hours if needed for itching   losartan (COZAAR) 100 MG tablet Take 100 mg by mouth daily.  02/19/2015: Received from: External Pharmacy   metFORMIN (GLUCOPHAGE) 500 MG tablet 1 tablet 2 (two) times daily with a meal.     nystatin (NYAMYC) powder Apply topically 2 (two) times daily as needed.    omeprazole (PRILOSEC) 40 MG capsule take 1 capsule by mouth once  daily BEFORE BREAKFAST    PARoxetine (PAXIL) 20 MG tablet Take 20 mg by mouth daily.    potassium chloride (K-DUR) 10 MEQ tablet Take 10 mEq by mouth daily.  04/26/2015:  Received from: External Pharmacy Received Sig:    PROAIR RESPICLICK 268 (90 Base) MCG/ACT AEPB INHALE 2 PUFFS BY MOUTH EVERY 4 HOURS IF NEEDED    promethazine (PHENERGAN) 25 MG tablet Take 25 mg by mouth every 4 (four) hours as needed for nausea or vomiting.     SYMBICORT 80-4.5 MCG/ACT inhaler     XARELTO 20 MG TABS tablet TAKE 1 TABLET BY MOUTH EVERY DAY WITH SUPPER    [DISCONTINUED] rivaroxaban (XARELTO) 20 MG TABS tablet TAKE 1 TABLET BY MOUTH WITH SUPPER    cefUROXime (CEFTIN) 500 MG tablet TK 1 T PO  BID    cyclobenzaprine (FLEXERIL) 10 MG tablet Take 1 tablet by mouth 3 (three) times daily. As needed 02/01/2016: Received from: External Pharmacy Received Sig: take 1 tablet by mouth three times a day if needed for SPASMS   FLUAD 0.5 ML SUSY ADM 0.5ML IM UTD    fluticasone (FLONASE) 50 MCG/ACT nasal spray Place 2 sprays into both nostrils daily as needed for allergies.     furosemide (LASIX) 40 MG tablet Take 40 mg by mouth 3 (three) times a week.  04/26/2015: Received from: External Pharmacy   oxyCODONE-acetaminophen (PERCOCET/ROXICET) 5-325 MG tablet TK 1 T PO BID PRN    temazepam (RESTORIL) 30 MG capsule Take 1 capsule (30 mg total) by mouth at bedtime as needed for sleep.    [DISCONTINUED] amoxicillin (AMOXIL) 500 MG capsule TAKE 4 CAPSULES BY MOUTH 1 TO 2 HOURS BEFORE DENTAL APPT.    [DISCONTINUED] baclofen (LIORESAL) 10 MG tablet TK 1 T PO TID PRN    [DISCONTINUED] benzonatate (TESSALON) 200 MG capsule Take 1 capsule (200 mg total) by mouth 3 (three) times daily as needed for cough.    [DISCONTINUED] budesonide-formoterol (SYMBICORT) 160-4.5 MCG/ACT inhaler Inhale 2 puffs into the lungs 2 (two) times daily.    [DISCONTINUED] predniSONE (STERAPRED UNI-PAK 21 TAB) 10 MG (21) TBPK tablet FPD    [DISCONTINUED] temazepam (RESTORIL) 30 MG capsule Take 1 capsule (30 mg total) by mouth at bedtime as needed for sleep. (Patient not taking: Reported on 01/31/2019)     Facility-Administered Encounter Medications as of 01/31/2019  Medication   0.9 %  sodium chloride infusion   [COMPLETED] cyanocobalamin ((VITAMIN B-12)) injection 1,000 mcg    ALLERGIES:  Allergies  Allergen Reactions   Shellfish Allergy Hives and Shortness Of Breath   Ace Inhibitors Cough   Aminophylline Nausea And Vomiting    Heart races and pounds   Epinephrine Other (See Comments)    Pallpitations during dental procedures   Theophyllines Nausea And Vomiting and Other (See Comments)    Heart races and pounds   Propoxyphene Hcl Nausea And Vomiting     PHYSICAL EXAM:  ECOG Performance status: 1  Vitals:   01/31/19 0914  BP: (!) 154/63  Pulse: 77  Resp: 16  Temp: 98.7 F (37.1 C)  SpO2: 99%   Filed Weights   01/31/19 0914  Weight: 254 lb 1.6 oz (115.3 kg)    Physical Exam Constitutional:      Appearance: Normal appearance. She is normal weight.  Cardiovascular:     Rate and Rhythm: Normal rate and regular rhythm.     Heart sounds: Normal heart sounds.  Pulmonary:     Effort: Pulmonary effort is normal.  Breath sounds: Normal breath sounds.  Abdominal:     General: Bowel sounds are normal.     Palpations: Abdomen is soft.  Musculoskeletal: Normal range of motion.  Skin:    General: Skin is warm and dry.  Neurological:     Mental Status: She is alert and oriented to person, place, and time. Mental status is at baseline.  Psychiatric:        Mood and Affect: Mood normal.        Behavior: Behavior normal.        Thought Content: Thought content normal.        Judgment: Judgment normal.   Breast: No palpable masses, no skin changes or nipple discharge, no adenopathy.   LABORATORY DATA:  I have reviewed the labs as listed.  CBC    Component Value Date/Time   WBC 6.7 01/24/2019 1250   RBC 3.29 (L) 01/24/2019 1250   HGB 8.7 (L) 01/24/2019 1250   HCT 29.7 (L) 01/24/2019 1250   PLT 397 01/24/2019 1250   MCV 90.3 01/24/2019 1250   MCH 26.4  01/24/2019 1250   MCHC 29.3 (L) 01/24/2019 1250   RDW 17.0 (H) 01/24/2019 1250   LYMPHSABS 1.3 01/24/2019 1250   MONOABS 0.4 01/24/2019 1250   EOSABS 0.3 01/24/2019 1250   BASOSABS 0.1 01/24/2019 1250   CMP Latest Ref Rng & Units 01/24/2019 11/13/2018 04/17/2018  Glucose 70 - 99 mg/dL 106(H) 131(H) 125(H)  BUN 8 - 23 mg/dL 26(H) 23 26(H)  Creatinine 0.44 - 1.00 mg/dL 1.11(H) 1.30(H) 1.50(H)  Sodium 135 - 145 mmol/L 138 138 140  Potassium 3.5 - 5.1 mmol/L 4.5 4.3 3.8  Chloride 98 - 111 mmol/L 104 102 97(L)  CO2 22 - 32 mmol/L 25 25 30   Calcium 8.9 - 10.3 mg/dL 9.2 9.4 9.4  Total Protein 6.5 - 8.1 g/dL 7.4 7.5 7.8  Total Bilirubin 0.3 - 1.2 mg/dL 0.3 0.5 0.3  Alkaline Phos 38 - 126 U/L 54 55 53  AST 15 - 41 U/L 14(L) 16 18  ALT 0 - 44 U/L 13 14 17    I personally performed a face-to-face visit.  All questions were answered to patient's stated satisfaction. Encouraged patient to call with any new concerns or questions before his next visit to the cancer center and we can certain see him sooner, if needed.     ASSESSMENT & PLAN:   Breast cancer of upper-inner quadrant of left female breast 1.  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 - She was treated with a left breast lumpectomy by Dr. Dalbert Batman. - Followed by adjuvant radiation therapy with Dr. Pablo Ledger.  Completed radiation 04/2015. - She started antiestrogen therapy with anastrozole in 05/2015.  The plan is to continue this therapy for 5 to 10 years. - Her Oncotype DX was low risk score. - Her mammogram will be done 02/2019. We will be looking for the results. - We will continue to follow her every 6 months.   2.  Osteopenia: - Her last DEXA scan done on 04/2018 showed osteopenia, with a T score of -1.4 -She is receiving Prolia every 6 months.  -Next injection is due 04/2019. -She is continuing to take calcium and vitamin  D. - She will have a repeat DEXA scan in 2 years.  3.  Anemia: -Likely due from CKD. - Labs on 01/24/2019.  Hemoglobin 8.7, ferritin 40, WBC 6.7, platelets 397.  Her creatinine is 1.11. -I have recommended 2 infusions of Feraheme 1 week apart. - She will return to the clinic in 3 months for follow-up labs.  5.  B12 deficiency: - Labs on 01/24/2019 showed her B12 at 185. - She is supposed to be given herself monthly B12 injections however she forgets to do this often. - I have started her on oral B12 daily to see if that helps since she forgets the injections and does not like doing them. -We will give her an injection of B12 today. -We will follow-up with labs in 3 months.  6.  Vitamin D deficiency: - Labs on 01/24/2019 showed her vitamin D level 29.5. -She takes vitamin D daily. - I have increased her dose to twice a day. - We will follow-up in 3 months with labs  7.  Atrial flutter: -Patient is on Xarelto. -Patient follows up with PCP or cardiology as recommended.      Orders placed this encounter:  Orders Placed This Encounter  Procedures   Lactate dehydrogenase   CBC with Differential/Platelet   Comprehensive metabolic panel   Ferritin   Iron and TIBC   Vitamin B12   VITAMIN D 25 Hydroxy (Vit-D Deficiency, Fractures)   Folate      Francene Finders, FNP-C [place] [phone]

## 2019-01-31 NOTE — Patient Instructions (Signed)
[facility] at [facility] Discharge Instructions      PLEASE START TAKING YOUR VITAMIN D TWICE A DAY. PLEASE START TAKING ORAL VITAMIN B12 DAILY. YOU WILL BE SCHEDULED WITH 2 IRON INFUSIONS. FOLLOW UP IN 3 MONTHS WITH REPEAT LABS AND PROLIA INJECTION.      Thank you for choosing [facility] at [facility] to provide your oncology and hematology care.  To afford each patient quality time with our provider, please arrive at least 15 minutes before your scheduled appointment time.   If you have a lab appointment with the [facility] please come in thru the  Main Entrance and check in at the main information desk  You need to re-schedule your appointment should you arrive 10 or more minutes late.  We strive to give you quality time with our providers, and arriving late affects you and other patients whose appointments are after yours.  Also, if you no show three or more times for appointments you may be dismissed from the clinic at the providers discretion.     Again, thank you for choosing [facility].  Our hope is that these requests will decrease the amount of time that you wait before being seen by our physicians.       _____________________________________________________________  Should you have questions after your visit to [facility], please contact our office at [phone] between the hours of 8:00 a.m. and 4:30 p.m.  Voicemails left after 4:00 p.m. will not be returned until the following business day.  For prescription refill requests, have your pharmacy contact our office and allow 72 hours.    Cancer Center Support Programs:   > Cancer Support Group  2nd Tuesday of the month 1pm-2pm, Journey Room

## 2019-01-31 NOTE — Assessment & Plan Note (Addendum)
1.  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 - She was treated with a left breast lumpectomy by Dr. Dalbert Batman. - Followed by adjuvant radiation therapy with Dr. Pablo Ledger.  Completed radiation 04/2015. - She started antiestrogen therapy with anastrozole in 05/2015.  The plan is to continue this therapy for 5 to 10 years. - Her Oncotype DX was low risk score. - Her mammogram will be done 02/2019. We will be looking for the results. - We will continue to follow her every 6 months.   2.  Osteopenia: - Her last DEXA scan done on 04/2018 showed osteopenia, with a T score of -1.4 -She is receiving Prolia every 6 months.  -Next injection is due 04/2019. -She is continuing to take calcium and vitamin D. - She will have a repeat DEXA scan in 2 years.  3.  Anemia: -Likely due from CKD. - Labs on 01/24/2019.  Hemoglobin 8.7, ferritin 40, WBC 6.7, platelets 397. Her creatinine is 1.11. -I have recommended 2 infusions of Feraheme 1 week apart. - She will return to the clinic in 3 months for follow-up labs.  5.  B12 deficiency: - Labs on 01/24/2019 showed her B12 at 185. - She is supposed to be given herself monthly B12 injections however she forgets to do this often. - I have started her on oral B12 daily to see if that helps since she forgets the injections and does not like doing them. -We will give her an injection of B12 today. -We will follow-up with labs in 3 months.  6.  Vitamin D deficiency: - Labs on 01/24/2019 showed her vitamin D level 29.5. -She takes vitamin D daily. - I have increased her dose to twice a day. - We will follow-up in 3 months with labs  7.  Atrial flutter: -Patient is on Xarelto. -Patient follows up with PCP or cardiology as recommended.

## 2019-02-11 ENCOUNTER — Other Ambulatory Visit (HOSPITAL_COMMUNITY): Payer: Self-pay | Admitting: Nurse Practitioner

## 2019-02-12 ENCOUNTER — Ambulatory Visit (HOSPITAL_COMMUNITY): Payer: Medicare Other

## 2019-02-19 ENCOUNTER — Ambulatory Visit (HOSPITAL_COMMUNITY): Payer: Medicare Other

## 2019-02-26 ENCOUNTER — Inpatient Hospital Stay (HOSPITAL_COMMUNITY): Payer: Medicare Other | Attending: Hematology

## 2019-02-26 DIAGNOSIS — D509 Iron deficiency anemia, unspecified: Secondary | ICD-10-CM | POA: Insufficient documentation

## 2019-03-04 ENCOUNTER — Other Ambulatory Visit (HOSPITAL_COMMUNITY): Payer: Self-pay | Admitting: Nurse Practitioner

## 2019-03-05 ENCOUNTER — Encounter (HOSPITAL_COMMUNITY): Payer: Self-pay

## 2019-03-05 ENCOUNTER — Inpatient Hospital Stay (HOSPITAL_COMMUNITY): Payer: Medicare Other

## 2019-03-05 ENCOUNTER — Other Ambulatory Visit: Payer: Self-pay

## 2019-03-05 VITALS — BP 155/58 | HR 65 | Temp 98.0°F | Resp 18

## 2019-03-05 DIAGNOSIS — Z170 Estrogen receptor positive status [ER+]: Secondary | ICD-10-CM

## 2019-03-05 DIAGNOSIS — C50212 Malignant neoplasm of upper-inner quadrant of left female breast: Secondary | ICD-10-CM

## 2019-03-05 DIAGNOSIS — Z79899 Other long term (current) drug therapy: Secondary | ICD-10-CM

## 2019-03-05 DIAGNOSIS — M8580 Other specified disorders of bone density and structure, unspecified site: Secondary | ICD-10-CM

## 2019-03-05 DIAGNOSIS — D509 Iron deficiency anemia, unspecified: Secondary | ICD-10-CM | POA: Diagnosis not present

## 2019-03-05 MED ORDER — SODIUM CHLORIDE 0.9 % IV SOLN
INTRAVENOUS | Status: DC
  Administered 2019-03-05: 09:00:00 via INTRAVENOUS

## 2019-03-05 MED ORDER — SODIUM CHLORIDE 0.9 % IV SOLN
510.0000 mg | Freq: Once | INTRAVENOUS | Status: AC
  Administered 2019-03-05: 09:00:00 510 mg via INTRAVENOUS
  Filled 2019-03-05: qty 510

## 2019-03-05 NOTE — Progress Notes (Signed)
Feraheme given per orders. Patient tolerated it well without problems. Vitals stable and discharged home from clinic ambulatory. Follow up as scheduled.  

## 2019-03-12 ENCOUNTER — Inpatient Hospital Stay (HOSPITAL_COMMUNITY): Payer: Medicare Other

## 2019-03-12 ENCOUNTER — Encounter (HOSPITAL_COMMUNITY): Payer: Self-pay

## 2019-03-12 ENCOUNTER — Other Ambulatory Visit: Payer: Self-pay

## 2019-03-12 VITALS — BP 131/52 | HR 69 | Temp 97.9°F | Resp 18

## 2019-03-12 DIAGNOSIS — Z79899 Other long term (current) drug therapy: Secondary | ICD-10-CM

## 2019-03-12 DIAGNOSIS — D509 Iron deficiency anemia, unspecified: Secondary | ICD-10-CM | POA: Diagnosis not present

## 2019-03-12 DIAGNOSIS — C50212 Malignant neoplasm of upper-inner quadrant of left female breast: Secondary | ICD-10-CM

## 2019-03-12 DIAGNOSIS — M8580 Other specified disorders of bone density and structure, unspecified site: Secondary | ICD-10-CM

## 2019-03-12 MED ORDER — SODIUM CHLORIDE 0.9 % IV SOLN
510.0000 mg | Freq: Once | INTRAVENOUS | Status: AC
  Administered 2019-03-12: 510 mg via INTRAVENOUS
  Filled 2019-03-12: qty 510

## 2019-03-12 MED ORDER — SODIUM CHLORIDE 0.9 % IV SOLN
INTRAVENOUS | Status: DC
  Administered 2019-03-12: 13:00:00 via INTRAVENOUS

## 2019-03-12 NOTE — Patient Instructions (Signed)
[facility]  Discharge Instructions:   _______________________________________________________________  Thank you for choosing [facility] to provide your oncology and hematology care.  To afford each patient quality time with our providers, please arrive at least 15 minutes before your scheduled appointment.  You need to re-schedule your appointment if you arrive 10 or more minutes late.  We strive to give you quality time with our providers, and arriving late affects you and other patients whose appointments are after yours.  Also, if you no show three or more times for appointments you may be dismissed from the clinic.  Again, thank you for choosing [facility]. Our hope is that these requests will allow you access to exceptional care and in a timely manner. _______________________________________________________________  If you have questions after your visit, please contact our office at [phone] between the hours of 8:30 a.m. and 5:00 p.m. Voicemails left after 4:30 p.m. will not be returned until the following business day. _______________________________________________________________  For prescription refill requests, have your pharmacy contact our office. _______________________________________________________________  Recommendations made by the consultant and any test results will be sent to your referring physician. _______________________________________________________________ 

## 2019-03-12 NOTE — Progress Notes (Signed)
Pt presents today for Feraheme. VSS. Pt has no complaints of any changes since her last visit. Last B12 was on 5/8. Per notes pt is to be giving herself injection of b12 at home and is also on oral B12. MAR reviewed.   Feraheme given today per MD orders. Tolerated infusion without adverse affects. Vital signs stable. No complaints at this time. Discharged from clinic ambulatory. F/U with [facility] as scheduled.

## 2019-03-17 ENCOUNTER — Other Ambulatory Visit (HOSPITAL_COMMUNITY): Payer: Self-pay | Admitting: Nurse Practitioner

## 2019-03-17 ENCOUNTER — Other Ambulatory Visit: Payer: Self-pay | Admitting: Adult Health

## 2019-03-17 DIAGNOSIS — I480 Paroxysmal atrial fibrillation: Secondary | ICD-10-CM

## 2019-04-04 ENCOUNTER — Ambulatory Visit: Payer: Medicare Other | Admitting: Adult Health

## 2019-05-07 ENCOUNTER — Other Ambulatory Visit (HOSPITAL_COMMUNITY): Payer: Self-pay | Admitting: *Deleted

## 2019-05-07 MED ORDER — FLECAINIDE ACETATE 50 MG PO TABS: 50.0000 mg | ORAL_TABLET | Freq: Two times a day (BID) | ORAL | 0 refills | Status: DC

## 2019-05-14 ENCOUNTER — Other Ambulatory Visit (HOSPITAL_COMMUNITY): Payer: Medicare Other

## 2019-05-14 ENCOUNTER — Encounter (HOSPITAL_COMMUNITY): Payer: Self-pay | Admitting: Nurse Practitioner

## 2019-05-14 ENCOUNTER — Other Ambulatory Visit: Payer: Self-pay

## 2019-05-14 ENCOUNTER — Ambulatory Visit (HOSPITAL_COMMUNITY)
Admission: RE | Admit: 2019-05-14 | Discharge: 2019-05-14 | Disposition: A | Discharge status: Home / Self Care | Payer: Medicare Other | Source: Ambulatory Visit | Attending: Nurse Practitioner | Admitting: Nurse Practitioner

## 2019-05-14 DIAGNOSIS — Z803 Family history of malignant neoplasm of breast: Secondary | ICD-10-CM | POA: Insufficient documentation

## 2019-05-14 DIAGNOSIS — M1990 Unspecified osteoarthritis, unspecified site: Secondary | ICD-10-CM | POA: Insufficient documentation

## 2019-05-14 DIAGNOSIS — Z6841 Body Mass Index (BMI) 40.0 and over, adult: Secondary | ICD-10-CM | POA: Insufficient documentation

## 2019-05-14 DIAGNOSIS — I480 Paroxysmal atrial fibrillation: Secondary | ICD-10-CM | POA: Diagnosis not present

## 2019-05-14 DIAGNOSIS — Z87891 Personal history of nicotine dependence: Secondary | ICD-10-CM | POA: Diagnosis not present

## 2019-05-14 DIAGNOSIS — Z836 Family history of other diseases of the respiratory system: Secondary | ICD-10-CM | POA: Diagnosis not present

## 2019-05-14 DIAGNOSIS — Z91013 Allergy to seafood: Secondary | ICD-10-CM | POA: Insufficient documentation

## 2019-05-14 DIAGNOSIS — E785 Hyperlipidemia, unspecified: Secondary | ICD-10-CM | POA: Insufficient documentation

## 2019-05-14 DIAGNOSIS — F419 Anxiety disorder, unspecified: Secondary | ICD-10-CM | POA: Insufficient documentation

## 2019-05-14 DIAGNOSIS — Z7901 Long term (current) use of anticoagulants: Secondary | ICD-10-CM | POA: Insufficient documentation

## 2019-05-14 DIAGNOSIS — Z800 Family history of malignant neoplasm of digestive organs: Secondary | ICD-10-CM | POA: Insufficient documentation

## 2019-05-14 DIAGNOSIS — I4819 Other persistent atrial fibrillation: Secondary | ICD-10-CM | POA: Diagnosis not present

## 2019-05-14 DIAGNOSIS — Z886 Allergy status to analgesic agent status: Secondary | ICD-10-CM | POA: Diagnosis not present

## 2019-05-14 DIAGNOSIS — Z79811 Long term (current) use of aromatase inhibitors: Secondary | ICD-10-CM | POA: Insufficient documentation

## 2019-05-14 DIAGNOSIS — Z888 Allergy status to other drugs, medicaments and biological substances status: Secondary | ICD-10-CM | POA: Diagnosis not present

## 2019-05-14 DIAGNOSIS — Z833 Family history of diabetes mellitus: Secondary | ICD-10-CM | POA: Insufficient documentation

## 2019-05-14 DIAGNOSIS — Z7984 Long term (current) use of oral hypoglycemic drugs: Secondary | ICD-10-CM | POA: Diagnosis not present

## 2019-05-14 DIAGNOSIS — Z79899 Other long term (current) drug therapy: Secondary | ICD-10-CM | POA: Insufficient documentation

## 2019-05-14 DIAGNOSIS — Z86010 Personal history of colonic polyps: Secondary | ICD-10-CM | POA: Diagnosis not present

## 2019-05-14 DIAGNOSIS — K219 Gastro-esophageal reflux disease without esophagitis: Secondary | ICD-10-CM | POA: Diagnosis not present

## 2019-05-14 DIAGNOSIS — I4892 Unspecified atrial flutter: Secondary | ICD-10-CM | POA: Diagnosis not present

## 2019-05-14 DIAGNOSIS — I10 Essential (primary) hypertension: Secondary | ICD-10-CM | POA: Insufficient documentation

## 2019-05-14 DIAGNOSIS — M797 Fibromyalgia: Secondary | ICD-10-CM | POA: Diagnosis not present

## 2019-05-14 DIAGNOSIS — Z801 Family history of malignant neoplasm of trachea, bronchus and lung: Secondary | ICD-10-CM | POA: Insufficient documentation

## 2019-05-14 DIAGNOSIS — Z853 Personal history of malignant neoplasm of breast: Secondary | ICD-10-CM | POA: Diagnosis not present

## 2019-05-14 DIAGNOSIS — Z96659 Presence of unspecified artificial knee joint: Secondary | ICD-10-CM | POA: Insufficient documentation

## 2019-05-14 DIAGNOSIS — G4733 Obstructive sleep apnea (adult) (pediatric): Secondary | ICD-10-CM | POA: Diagnosis not present

## 2019-05-14 DIAGNOSIS — R9431 Abnormal electrocardiogram [ECG] [EKG]: Secondary | ICD-10-CM | POA: Insufficient documentation

## 2019-05-14 DIAGNOSIS — J449 Chronic obstructive pulmonary disease, unspecified: Secondary | ICD-10-CM | POA: Insufficient documentation

## 2019-05-14 DIAGNOSIS — Z8249 Family history of ischemic heart disease and other diseases of the circulatory system: Secondary | ICD-10-CM | POA: Insufficient documentation

## 2019-05-14 NOTE — Progress Notes (Signed)
Primary Care Physician: [name], MD Referring Physician: Dr. Rayann Heman Cardiologist: Dr. Martinique   [name] is a 68 y.o. female with a h/o morbid obesity, persistent afib that is in the afib clinic for f/u. She  Is found to be  in afib this am. Pt was unaware. She states that she missed a week of flecainide 2/2 refill issues and has been back on it x 2 days. She has also been taking a triple supplement consisting of garcinia/cambogia/chromium,for weight loss.  all of which can contribute to heart irregularity. She has been taking x 2 months and has lost 12 lbs. No issues with  xarelto 20 mg qd, although her H/H has been running lower over the last few months and is getting intermittent RBC infusions at the [facility].. She does not report any breakthrough afib, no bleeding issues. She continues on cpap.    Today, she denies symptoms of palpitations, chest pain, shortness of breath, orthopnea, PND, lower extremity edema, dizziness, presyncope, syncope, or neurologic sequela. The patient is tolerating medications without difficulties and is otherwise without complaint today.   Past Medical History:  Diagnosis Date  . Acute renal failure ([place]) 01/27/2015  . Acute respiratory failure ([place]) 01/27/2015  . Adenomatous colon polyp 1993  . Allergy   . Anemia   . Anxiety   . Anxiety disorder   . Asthma    Spirometry 1989: FEV1 2.12 (71%) with ratio 82.  HFA 90% after coaching 11-09-10  . Atrial flutter ([place]) chadsvasc of 3 (11/19/14)   Status post cardioversion 2-12 2013. s/p RFCA 5/13 with JAllred (coumadin and amio stopped)   . Breast cancer ([place])    left  . COPD (chronic obstructive pulmonary disease) ([place])   . DDD (degenerative disc disease)   . Diverticulosis   . Essential hypertension, benign   . Fibromyalgia   . GERD (gastroesophageal reflux disease)    EGD with HH / esophagitis 06-30-93  . Hiatal hernia   . History of atrial fibrillation   . Hyperlipidemia   . Internal  and external hemorrhoids without complication   . Lipoma of colon   . Migraine   . Osteoarthritis   . Pelvic fracture ([place])   . Personal history of colonic polyps 06/14/2010  . Pneumonia   . PONV (postoperative nausea and vomiting)   . Radiation    left breast cancer  . Sleep apnea    cpap   Past Surgical History:  Procedure Laterality Date  . A FLUTTER ABLATION  02/22/2012  . ATRIAL FLUTTER ABLATION N/A 02/22/2012   Procedure: ATRIAL FLUTTER ABLATION;  Surgeon: Thompson Grayer, MD;  Location: [facility] CATH LAB;  Service: Cardiovascular;  Laterality: N/A;  . BREAST BIOPSY Left 11/2014  . CARDIOVERSION N/A 02/19/2014   Procedure: CARDIOVERSION;  Surgeon: Dorothy Spark, MD;  Location: [place];  Service: Cardiovascular;  Laterality: N/A;  . COLONOSCOPY  05/2010, 07/2000  . DILATION AND CURETTAGE OF UTERUS    . ESOPHAGOGASTRODUODENOSCOPY  05/2010, 06/1993  . JOINT REPLACEMENT    . POLYPECTOMY    . RADIOACTIVE SEED GUIDED PARTIAL MASTECTOMY WITH AXILLARY SENTINEL LYMPH NODE BIOPSY Left 01/26/2015   Procedure: LEFT  PARTIAL MASTECTOMY WITH  RADIOACTIVE SEED LOCALIZATION, LEFT AXILLARY SENTINEL LYMPH NODE BIOPSY;  Surgeon: Fanny Skates, MD;  Location: [facility];  Service: General;  Laterality: Left;  . ROTATOR CUFF REPAIR     bilateral  . ROTATOR CUFF REPAIR     bil  . TOTAL KNEE ARTHROPLASTY  x2 09/2006  . UPPER GASTROINTESTINAL ENDOSCOPY      Current Outpatient Medications  Medication Sig Dispense Refill  . albuterol (PROVENTIL) (2.5 MG/3ML) 0.083% nebulizer solution Take 3 mLs (2.5 mg total) by nebulization every 4 (four) hours as needed for wheezing or shortness of breath (dx: J45.991). 100 vial 5  . ALPRAZolam (XANAX) 0.5 MG tablet Take 0.5 mg by mouth 2 (two) times daily as needed for anxiety.   0  . anastrozole (ARIMIDEX) 1 MG tablet TAKE 1 TABLET BY MOUTH EVERY DAY(NEED APPOINTMENT FOR MORE REFILLS) 90 tablet 3  . atorvastatin (LIPITOR) 10 MG tablet Take 10 mg by mouth daily.  0  .  bisoprolol (ZEBETA) 10 MG tablet Take 1 tablet (10 mg total) by mouth daily. 30 tablet 2  . budesonide-formoterol (SYMBICORT) 80-4.5 MCG/ACT inhaler Symbicort 80 mcg-4.5 mcg/actuation HFA aerosol inhaler  INHALE 2 PUFFS BY MOUTH TWICE A DAY    . calcium-vitamin D (OSCAL WITH D) 500-200 MG-UNIT per tablet Take 1 tablet by mouth daily with breakfast. (Patient taking differently: Take 2 tablets by mouth daily with breakfast. ) 30 tablet 3  . CARTIA XT 180 MG 24 hr capsule take 1 capsule by mouth once daily 90 capsule 3  . celecoxib (CELEBREX) 200 MG capsule Take 1 capsule by mouth every 3-4 days  0  . cyanocobalamin (,VITAMIN B-12,) 1000 MCG/ML injection Inject 1 mL (1,000 mcg total) into the muscle every 30 (thirty) days. 10 mL 0  . cyclobenzaprine (FLEXERIL) 10 MG tablet Take 1 tablet by mouth 3 (three) times daily. As needed  0  . flecainide (TAMBOCOR) 50 MG tablet Take 1 tablet (50 mg total) by mouth 2 (two) times daily. 180 tablet 0  . FLUAD 0.5 ML SUSY ADM 0.5ML IM UTD    . furosemide (LASIX) 40 MG tablet Take 40 mg by mouth 3 (three) times a week.   0  . GARCINIA CAMBOGIA-CHROMIUM PO Take 1 capsule by mouth daily.     . hydrOXYzine (ATARAX/VISTARIL) 25 MG tablet Take 1 tablet by mouth every 6 (six) hours as needed. Itching  0  . losartan (COZAAR) 100 MG tablet Take 100 mg by mouth daily.   1  . metFORMIN (GLUCOPHAGE) 500 MG tablet 1 tablet 2 (two) times daily with a meal.     . omeprazole (PRILOSEC) 40 MG capsule take 1 capsule by mouth once daily BEFORE BREAKFAST 90 capsule 0  . PARoxetine (PAXIL) 20 MG tablet Take 20 mg by mouth daily.    . potassium chloride (K-DUR) 10 MEQ tablet Take 10 mEq by mouth daily.   0  . PROAIR RESPICLICK 161 (90 Base) MCG/ACT AEPB INHALE 2 PUFFS BY MOUTH EVERY 4 HOURS IF NEEDED 1 each 0  . promethazine (PHENERGAN) 25 MG tablet Take 25 mg by mouth every 4 (four) hours as needed for nausea or vomiting.     Alveda Reasons 20 MG TABS tablet TAKE 1 TABLET BY MOUTH EVERY  DAY WITH SUPPER 30 tablet 6  . nystatin (NYAMYC) powder Apply topically 2 (two) times daily as needed. 60 g 2   No current facility-administered medications for this encounter.    Facility-Administered Medications Ordered in Other Encounters  Medication Dose Route Frequency Provider Last Rate Last Dose  . 0.9 %  sodium chloride infusion   Intravenous Continuous Penland, Kelby Fam, MD        Allergies  Allergen Reactions  . Shellfish Allergy Hives and Shortness Of Breath  . Ace Inhibitors Cough  .  Aminophylline Nausea And Vomiting    Heart races and pounds  . Epinephrine Other (See Comments)    Pallpitations during dental procedures  . Theophyllines Nausea And Vomiting and Other (See Comments)    Heart races and pounds  . Propoxyphene Hcl Nausea And Vomiting    Social History   Socioeconomic History  . Marital status: Widowed    Spouse name: Not on file  . Number of children: 0  . Years of education: Not on file  . Highest education level: Not on file  Occupational History  . Occupation: Database administrator at [employer]  . Financial resource strain: Not on file  . Food insecurity    Worry: Not on file    Inability: Not on file  . Transportation needs    Medical: Not on file    Non-medical: Not on file  Tobacco Use  . Smoking status: Former Smoker    Packs/day: 1.00    Years: 20.00    Pack years: 20.00    Types: Cigarettes    Quit date: 09/25/1984    Years since quitting: 34.6  . Smokeless tobacco: Never Used  Substance and Sexual Activity  . Alcohol use: No    Alcohol/week: 0.0 standard drinks  . Drug use: No  . Sexual activity: Not Currently  Lifestyle  . Physical activity    Days per week: Not on file    Minutes per session: Not on file  . Stress: Not on file  Relationships  . Social Herbalist on phone: Not on file    Gets together: Not on file    Attends religious service: Not on file    Active member of club or  organization: Not on file    Attends meetings of clubs or organizations: Not on file    Relationship status: Not on file  . Intimate partner violence    Fear of current or ex partner: Not on file    Emotionally abused: Not on file    Physically abused: Not on file    Forced sexual activity: Not on file  Other Topics Concern  . Not on file  Social History Narrative  . Not on file    Family History  Problem Relation Age of Onset  . Emphysema Father        smoker  . Heart disease Father   . Breast cancer Mother 100  . Breast cancer Sister 25  . Heart Problems Sister   . Heart disease Sister   . Breast cancer Maternal Aunt 48  . Lung cancer Maternal Uncle 15       smoker  . Colon cancer Paternal Uncle 47  . Diabetes Maternal Grandmother   . Heart Problems Maternal Grandfather   . Stroke Paternal Grandfather   . Lung cancer Maternal Uncle 72       smoker  . Heart Problems Paternal Uncle   . Diabetes Paternal Grandmother   . Rectal cancer Neg Hx   . Stomach cancer Neg Hx   . Esophageal cancer Neg Hx     ROS- All systems are reviewed and negative except as per the HPI above  Physical Exam: Vitals:   05/14/19 0839  BP: 110/60  Pulse: (!) 108  Weight: 116 kg  Height: 5\' 6"  (1.676 m)   Wt Readings from Last 3 Encounters:  05/14/19 116 kg  01/31/19 115.3 kg  10/25/18 121.3 kg    Labs: Lab Results  Component Value Date   NA 138 01/24/2019   K 4.5 01/24/2019   CL 104 01/24/2019   CO2 25 01/24/2019   GLUCOSE 106 (H) 01/24/2019   BUN 26 (H) 01/24/2019   CREATININE 1.11 (H) 01/24/2019   CALCIUM 9.2 01/24/2019   PHOS 5.4 (H) 01/28/2015   MG 2.0 01/28/2015   Lab Results  Component Value Date   INR 1.18 01/25/2015   No results found for: CHOL, HDL, LDLCALC, TRIG   GEN- The patient is well appearing, alert and oriented x 3 today.   Head- normocephalic, atraumatic Eyes-  Sclera clear, conjunctiva pink Ears- hearing intact Oropharynx- clear Neck- supple, no  JVP Lymph- no cervical lymphadenopathy Lungs- Clear to ausculation bilaterally, normal work of breathing Heart-irregular rate and rhythm, no murmurs, rubs or gallops, PMI not laterally displaced GI- soft, NT, ND, + BS Extremities- no clubbing, cyanosis, or edema MS- no significant deformity or atrophy Skin- no rash or lesion Psych- euthymic mood, full affect Neuro- strength and sensation are intact  EKG-afib at 108  Bpm, qrs int 92 ms, qtc 479 ms Epic records reviewed   Assessment and Plan: 1. Persistent afib   in afib today Stop supplement  Continue flecainide 50 mg bid, had recently missed some doses Continue bisoprolol 10 mg qd Continue xarelto 20 mg bid    2. OSA Continue compliance with CPAP  3. Morbid obesity Weight loss encouraged, but not thru supplements  4. HTN Stable  F/u in afib clinic in 2 weeks  Butch Penny C. Albert Hersch, [facility] [address] [place], [place] [address] [phone]

## 2019-05-19 ENCOUNTER — Inpatient Hospital Stay (HOSPITAL_COMMUNITY): Payer: Medicare Other | Attending: Nurse Practitioner

## 2019-05-19 DIAGNOSIS — Z170 Estrogen receptor positive status [ER+]: Secondary | ICD-10-CM | POA: Insufficient documentation

## 2019-05-19 DIAGNOSIS — I4892 Unspecified atrial flutter: Secondary | ICD-10-CM | POA: Insufficient documentation

## 2019-05-19 DIAGNOSIS — Z79811 Long term (current) use of aromatase inhibitors: Secondary | ICD-10-CM | POA: Insufficient documentation

## 2019-05-19 DIAGNOSIS — Z7901 Long term (current) use of anticoagulants: Secondary | ICD-10-CM | POA: Insufficient documentation

## 2019-05-19 DIAGNOSIS — E559 Vitamin D deficiency, unspecified: Secondary | ICD-10-CM | POA: Insufficient documentation

## 2019-05-19 DIAGNOSIS — D649 Anemia, unspecified: Secondary | ICD-10-CM | POA: Insufficient documentation

## 2019-05-19 DIAGNOSIS — E538 Deficiency of other specified B group vitamins: Secondary | ICD-10-CM | POA: Insufficient documentation

## 2019-05-19 DIAGNOSIS — C50212 Malignant neoplasm of upper-inner quadrant of left female breast: Secondary | ICD-10-CM | POA: Insufficient documentation

## 2019-05-19 DIAGNOSIS — M8580 Other specified disorders of bone density and structure, unspecified site: Secondary | ICD-10-CM | POA: Insufficient documentation

## 2019-05-19 DIAGNOSIS — N189 Chronic kidney disease, unspecified: Secondary | ICD-10-CM | POA: Insufficient documentation

## 2019-05-20 ENCOUNTER — Other Ambulatory Visit: Payer: Self-pay

## 2019-05-20 ENCOUNTER — Inpatient Hospital Stay (HOSPITAL_COMMUNITY): Payer: Medicare Other

## 2019-05-20 DIAGNOSIS — D631 Anemia in chronic kidney disease: Secondary | ICD-10-CM

## 2019-05-20 DIAGNOSIS — E559 Vitamin D deficiency, unspecified: Secondary | ICD-10-CM | POA: Diagnosis not present

## 2019-05-20 DIAGNOSIS — C50212 Malignant neoplasm of upper-inner quadrant of left female breast: Secondary | ICD-10-CM

## 2019-05-20 DIAGNOSIS — Z170 Estrogen receptor positive status [ER+]: Secondary | ICD-10-CM | POA: Diagnosis not present

## 2019-05-20 DIAGNOSIS — E538 Deficiency of other specified B group vitamins: Secondary | ICD-10-CM | POA: Diagnosis not present

## 2019-05-20 DIAGNOSIS — M8580 Other specified disorders of bone density and structure, unspecified site: Secondary | ICD-10-CM | POA: Diagnosis not present

## 2019-05-20 DIAGNOSIS — I4892 Unspecified atrial flutter: Secondary | ICD-10-CM | POA: Diagnosis not present

## 2019-05-20 DIAGNOSIS — Z79811 Long term (current) use of aromatase inhibitors: Secondary | ICD-10-CM | POA: Diagnosis not present

## 2019-05-20 DIAGNOSIS — Z7901 Long term (current) use of anticoagulants: Secondary | ICD-10-CM | POA: Diagnosis not present

## 2019-05-20 DIAGNOSIS — N189 Chronic kidney disease, unspecified: Secondary | ICD-10-CM | POA: Diagnosis not present

## 2019-05-20 DIAGNOSIS — D649 Anemia, unspecified: Secondary | ICD-10-CM | POA: Diagnosis not present

## 2019-05-20 LAB — IRON AND TIBC
Iron: 50 ug/dL (ref 28–170)
Saturation Ratios: 14 % (ref 10.4–31.8)
TIBC: 349 ug/dL (ref 250–450)
UIBC: 299 ug/dL

## 2019-05-20 LAB — CBC WITH DIFFERENTIAL/PLATELET
Abs Immature Granulocytes: 0.02 10*3/uL (ref 0.00–0.07)
Basophils Absolute: 0 10*3/uL (ref 0.0–0.1)
Basophils Relative: 1 %
Eosinophils Absolute: 0.2 10*3/uL (ref 0.0–0.5)
Eosinophils Relative: 2 %
HCT: 30.8 % — ABNORMAL LOW (ref 36.0–46.0)
Hemoglobin: 9.4 g/dL — ABNORMAL LOW (ref 12.0–15.0)
Immature Granulocytes: 0 %
Lymphocytes Relative: 12 %
Lymphs Abs: 0.9 10*3/uL (ref 0.7–4.0)
MCH: 28.4 pg (ref 26.0–34.0)
MCHC: 30.5 g/dL (ref 30.0–36.0)
MCV: 93.1 fL (ref 80.0–100.0)
Monocytes Absolute: 0.5 10*3/uL (ref 0.1–1.0)
Monocytes Relative: 7 %
Neutro Abs: 6.1 10*3/uL (ref 1.7–7.7)
Neutrophils Relative %: 78 %
Platelets: 324 10*3/uL (ref 150–400)
RBC: 3.31 MIL/uL — ABNORMAL LOW (ref 3.87–5.11)
RDW: 17.3 % — ABNORMAL HIGH (ref 11.5–15.5)
WBC: 7.9 10*3/uL (ref 4.0–10.5)
nRBC: 0 % (ref 0.0–0.2)

## 2019-05-20 LAB — COMPREHENSIVE METABOLIC PANEL
ALT: 14 U/L (ref 0–44)
AST: 14 U/L — ABNORMAL LOW (ref 15–41)
Albumin: 3.8 g/dL (ref 3.5–5.0)
Alkaline Phosphatase: 58 U/L (ref 38–126)
Anion gap: 9 (ref 5–15)
BUN: 26 mg/dL — ABNORMAL HIGH (ref 8–23)
CO2: 27 mmol/L (ref 22–32)
Calcium: 9.5 mg/dL (ref 8.9–10.3)
Chloride: 101 mmol/L (ref 98–111)
Creatinine, Ser: 1.26 mg/dL — ABNORMAL HIGH (ref 0.44–1.00)
GFR calc Af Amer: 50 mL/min — ABNORMAL LOW (ref >60)
GFR calc non Af Amer: 43 mL/min — ABNORMAL LOW (ref >60)
Glucose, Bld: 100 mg/dL — ABNORMAL HIGH (ref 70–99)
Potassium: 4.6 mmol/L (ref 3.5–5.1)
Sodium: 137 mmol/L (ref 135–145)
Total Bilirubin: 0.7 mg/dL (ref 0.3–1.2)
Total Protein: 7.4 g/dL (ref 6.5–8.1)

## 2019-05-20 LAB — FERRITIN: Ferritin: 112 ng/mL (ref 11–307)

## 2019-05-20 LAB — VITAMIN B12: Vitamin B-12: 1376 pg/mL — ABNORMAL HIGH (ref 180–914)

## 2019-05-20 LAB — LACTATE DEHYDROGENASE: LDH: 114 U/L (ref 98–192)

## 2019-05-20 LAB — FOLATE: Folate: 8.7 ng/mL (ref >5.9)

## 2019-05-21 ENCOUNTER — Inpatient Hospital Stay (HOSPITAL_COMMUNITY): Payer: Medicare Other

## 2019-05-21 ENCOUNTER — Other Ambulatory Visit: Payer: Self-pay

## 2019-05-21 ENCOUNTER — Inpatient Hospital Stay (HOSPITAL_BASED_OUTPATIENT_CLINIC_OR_DEPARTMENT_OTHER): Payer: Medicare Other | Admitting: Nurse Practitioner

## 2019-05-21 ENCOUNTER — Other Ambulatory Visit: Payer: Self-pay | Admitting: Pulmonary Disease

## 2019-05-21 DIAGNOSIS — Z79899 Other long term (current) drug therapy: Secondary | ICD-10-CM

## 2019-05-21 DIAGNOSIS — C50212 Malignant neoplasm of upper-inner quadrant of left female breast: Secondary | ICD-10-CM

## 2019-05-21 DIAGNOSIS — B379 Candidiasis, unspecified: Secondary | ICD-10-CM

## 2019-05-21 DIAGNOSIS — Z170 Estrogen receptor positive status [ER+]: Secondary | ICD-10-CM

## 2019-05-21 DIAGNOSIS — M8580 Other specified disorders of bone density and structure, unspecified site: Secondary | ICD-10-CM

## 2019-05-21 LAB — VITAMIN D 25 HYDROXY (VIT D DEFICIENCY, FRACTURES): Vit D, 25-Hydroxy: 30.1 ng/mL (ref 30.0–100.0)

## 2019-05-21 MED ORDER — ANASTROZOLE 1 MG PO TABS: ORAL_TABLET | ORAL | 3 refills | Status: DC

## 2019-05-21 MED ORDER — DENOSUMAB 60 MG/ML ~~LOC~~ SOLN: 60.0000 mg | Freq: Once | SUBCUTANEOUS | Status: DC

## 2019-05-21 MED ORDER — NYSTATIN 100000 UNIT/GM EX POWD: Freq: Two times a day (BID) | CUTANEOUS | 2 refills | Status: DC | PRN

## 2019-05-21 MED ORDER — TEMAZEPAM 15 MG PO CAPS: 15.0000 mg | ORAL_CAPSULE | Freq: Every evening | ORAL | 0 refills | Status: DC | PRN

## 2019-05-21 MED ORDER — DENOSUMAB 60 MG/ML ~~LOC~~ SOSY
60.0000 mg | PREFILLED_SYRINGE | Freq: Once | SUBCUTANEOUS | Status: AC
  Administered 2019-05-21: 60 mg via SUBCUTANEOUS
  Filled 2019-05-21: qty 1

## 2019-05-21 NOTE — Assessment & Plan Note (Addendum)
1.  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 - She was treated with a left breast lumpectomy by Dr. Dalbert Batman. - Followed by adjuvant radiation therapy with Dr. Pablo Ledger.  Completed radiation 04/2015. - She started antiestrogen therapy with anastrozole in 05/2015.  The plan is to continue this therapy for 10 years. - Her Oncotype DX was low risk score.  Patient was to continue therapy for 10 years. - Her mammogram will be done 02/2019 which was BI-RADS Category 0 incomplete.  Ultrasound was done which showed scar tissue.  She will repeat in 1 year with mammogram. - We will continue to follow her every 6 months with labs.   2.  Osteopenia: - Her last DEXA scan done on 04/2018 showed osteopenia, with a T score of -1.4 -She is receiving Prolia every 6 months.  -She received her injection today 05/21/2019 -She is continuing to take calcium and vitamin D. - She will have a repeat DEXA scan in 04/2020  3.  Anemia: -Likely due from CKD. - Labs on 05/20/2019 showed her hemoglobin 9.4, ferritin 112, percent saturation 14, platelets 324, WBC 7.9 -I have recommended 2 infusions of Feraheme 1 week apart. - She will return to the clinic in 3 months for follow-up labs.  5.  B12 deficiency: - Labs on 01/24/2019 showed her B12 at 185. -Labs on 05/20/2019 showed her B12 level came up to 1376. - She is supposed to be given herself monthly B12 injections however she forgets to do this often. - I have started her on oral B12 daily to see if that helps since she forgets the injections and does not like doing them. -We will follow-up with labs in 3 months.  6.  Vitamin D deficiency: - Labs on 01/24/2019 showed her vitamin D level 29.5. -She takes vitamin D daily. - I have increased her dose to twice a day. - We will follow-up in 3 months with labs  7.  Atrial flutter: -Patient is on Xarelto. -Patient  follows up with PCP or cardiology as recommended.

## 2019-05-21 NOTE — Patient Instructions (Signed)
[facility] at [facility] Discharge Instructions  Follow-up in 3 months with repeat labs   Thank you for choosing [facility] at [facility] to provide your oncology and hematology care.  To afford each patient quality time with our provider, please arrive at least 15 minutes before your scheduled appointment time.   If you have a lab appointment with the Cancer Center please come in thru the Main Entrance and check in at the main information desk.  You need to re-schedule your appointment should you arrive 10 or more minutes late.  We strive to give you quality time with our providers, and arriving late affects you and other patients whose appointments are after yours.  Also, if you no show three or more times for appointments you may be dismissed from the clinic at the providers discretion.     Again, thank you for choosing [facility].  Our hope is that these requests will decrease the amount of time that you wait before being seen by our physicians.       _____________________________________________________________  Should you have questions after your visit to  Cancer Center, please contact our office at [phone] between the hours of 8:00 a.m. and 4:30 p.m.  Voicemails left after 4:00 p.m. will not be returned until the following business day.  For prescription refill requests, have your pharmacy contact our office and allow 72 hours.    Due to Covid, you will need to wear a mask upon entering the hospital. If you do not have a mask, a mask will be given to you at the Main Entrance upon arrival. For doctor visits, patients may have 1 support person with them. For treatment visits, patients can not have anyone with them due to social distancing guidelines and our immunocompromised population.      

## 2019-05-21 NOTE — Progress Notes (Signed)
[address] [place], [place] [address]   CLINIC:  Medical Oncology/Hematology  PCP:  [name], MD [place] [id number] [phone]   REASON FOR VISIT: Follow-up for left breast cancer  CURRENT THERAPY: Anastrozole  BRIEF ONCOLOGIC HISTORY:  Oncology History  Breast cancer of upper-inner quadrant of left female breast ([facility])  12/15/2014 Mammogram   new 43m round nodule in left breast is indeterminate   12/17/2014 Imaging   6 mm lobulated nodule in the left breast is at low suspicion for malignancy. ultrasound guided biopsy recommended   12/23/2014 Procedure   [facility] Invasive ductal carcinoma.  ER+ (100%), PR+ (99%), HER2 neg (ratio 1.61). Ki67 45%.    01/26/2015 Surgery   (L) lumpectomy with SLNB (Dalbert Batman: IDC, grade 2, spanning 1.1 cm; also low-grade DCIS; margins negative. 0/3 left axillary SLN. HER2 repeated and remains neg. (ratio 1.33).  pT1c, pN0: Stage IA   03/19/2015 Genetic Testing   Genetic testing: Negative. Genes analyzed: ATM, BARD1, BRCA1, BRCA2, BRIP1, CDH1, CHEK2, FANCC, MLH1, MSH2, MSH6, NBN, PALB2, PMS2, PTEN, RAD51C, RAD51D, STK11, TP53, and XRCC2.  Additionally, this panel includes deletion/duplication analysis (without next-generation sequencing) of one gene, EPCAM.   03/2015 - 04/2015 Radiation Therapy   Adjuvant breast radiation with Dr. WPablo Ledger    04/30/2015 Imaging   DEXA scan: Osteopenia. (T-Score of -1.1)   05/2015 -  Anti-estrogen oral therapy   Anastrozole. Planned duration of therapy 5-10 years.    07/16/2015 Miscellaneous   Prolia injections started.       INTERVAL HISTORY:  [name]. female returns for routine follow-up for left breast cancer.  She reports she is still fatigued and has problems sleeping at night.  Otherwise she is doing well. Denies any nausea, vomiting, or diarrhea. Denies any new pains. Had not noticed any recent bleeding such as  epistaxis, hematuria or hematochezia. Denies recent chest pain on exertion, shortness of breath on minimal exertion, pre-syncopal episodes, or palpitations. Denies any numbness or tingling in hands or feet. Denies any recent fevers, infections, or recent hospitalizations. Patient reports appetite at 50% and energy level at 50%.  She is eating well maintaining her weight at this time.    REVIEW OF SYSTEMS:  Review of Systems  Constitutional: Positive for fatigue.  All other systems reviewed and are negative.    PAST MEDICAL/SURGICAL HISTORY:  Past Medical History:  Diagnosis Date  . [place] ([place] 01/27/2015  . Acute respiratory failure ([facility] 01/27/2015  . Adenomatous colon polyp 1993  . Allergy   . Anemia   . Anxiety   . Anxiety disorder   . Asthma    Spirometry 1989: FEV1 2.12 (71%) with ratio 82.  HFA 90% after coaching 11-09-10  . [place]utter ([place] chadsvasc of 3 (11/19/14)   Status post cardioversion 2-12 2013. s/p RFCA 5/13 with [facility] (coumadin and amio stopped)   . Breast cancer ([facility]    left  . COPD (chronic obstructive pulmonary disease) ([facility]   . DDD (degenerative disc disease)   . Diverticulosis   . Essential hypertension, benign   . Fibromyalgia   . GERD (gastroesophageal reflux disease)    EGD with HH / esophagitis 06-30-93  . Hiatal hernia   . History of atrial fibrillation   . Hyperlipidemia   . Internal and external hemorrhoids without complication   . Lipoma of colon   . Migraine   . Osteoarthritis   .  Pelvic fracture ([place])   . Personal history of colonic polyps 06/14/2010  . Pneumonia   . PONV (postoperative nausea and vomiting)   . Radiation    left breast cancer  . Sleep apnea    cpap   Past Surgical History:  Procedure Laterality Date  . A FLUTTER ABLATION  02/22/2012  . ATRIAL FLUTTER ABLATION N/A 02/22/2012   Procedure: ATRIAL FLUTTER ABLATION;  Surgeon: Thompson Grayer, MD;  Location: [facility] CATH LAB;  Service: Cardiovascular;  Laterality:  N/A;  . BREAST BIOPSY Left 11/2014  . CARDIOVERSION N/A 02/19/2014   Procedure: CARDIOVERSION;  Surgeon: Dorothy Spark, MD;  Location: [place] [facility];  Service: Cardiovascular;  Laterality: N/A;  . COLONOSCOPY  05/2010, 07/2000  . DILATION AND CURETTAGE OF UTERUS    . ESOPHAGOGASTRODUODENOSCOPY  05/2010, 06/1993  . JOINT REPLACEMENT    . POLYPECTOMY    . RADIOACTIVE SEED GUIDED PARTIAL MASTECTOMY WITH AXILLARY SENTINEL LYMPH NODE BIOPSY Left 01/26/2015   Procedure: LEFT  PARTIAL MASTECTOMY WITH  RADIOACTIVE SEED LOCALIZATION, LEFT AXILLARY SENTINEL LYMPH NODE BIOPSY;  Surgeon: Fanny Skates, MD;  Location: [facility];  Service: General;  Laterality: Left;  . ROTATOR CUFF REPAIR     bilateral  . ROTATOR CUFF REPAIR     bil  . TOTAL KNEE ARTHROPLASTY     x2 09/2006  . UPPER GASTROINTESTINAL ENDOSCOPY       SOCIAL HISTORY:  Social History   Socioeconomic History  . Marital status: Widowed    Spouse name: Not on file  . Number of children: 0  . Years of education: Not on file  . Highest education level: Not on file  Occupational History  . Occupation: Database administrator at [employer]  . Financial resource strain: Not on file  . Food insecurity    Worry: Not on file    Inability: Not on file  . Transportation needs    Medical: Not on file    Non-medical: Not on file  Tobacco Use  . Smoking status: Former Smoker    Packs/day: 1.00    Years: 20.00    Pack years: 20.00    Types: Cigarettes    Quit date: 09/25/1984    Years since quitting: 34.6  . Smokeless tobacco: Never Used  Substance and Sexual Activity  . Alcohol use: No    Alcohol/week: 0.0 standard drinks  . Drug use: No  . Sexual activity: Not Currently  Lifestyle  . Physical activity    Days per week: Not on file    Minutes per session: Not on file  . Stress: Not on file  Relationships  . Social Herbalist on phone: Not on file    Gets together: Not on file    Attends religious  service: Not on file    Active member of club or organization: Not on file    Attends meetings of clubs or organizations: Not on file    Relationship status: Not on file  . Intimate partner violence    Fear of current or ex partner: Not on file    Emotionally abused: Not on file    Physically abused: Not on file    Forced sexual activity: Not on file  Other Topics Concern  . Not on file  Social History Narrative  . Not on file    FAMILY HISTORY:  Family History  Problem Relation Age of Onset  . Emphysema Father  smoker  . Heart disease Father   . Breast cancer Mother 68  . Breast cancer Sister 29  . Heart Problems Sister   . Heart disease Sister   . Breast cancer Maternal Aunt 48  . Lung cancer Maternal Uncle 76       smoker  . Colon cancer Paternal Uncle 12  . Diabetes Maternal Grandmother   . Heart Problems Maternal Grandfather   . Stroke Paternal Grandfather   . Lung cancer Maternal Uncle 72       smoker  . Heart Problems Paternal Uncle   . Diabetes Paternal Grandmother   . Rectal cancer Neg Hx   . Stomach cancer Neg Hx   . Esophageal cancer Neg Hx     CURRENT MEDICATIONS:  Outpatient Encounter Medications as of 05/21/2019  Medication Sig Note  . anastrozole (ARIMIDEX) 1 MG tablet TAKE 1 TABLET BY MOUTH EVERY DAY(NEED APPOINTMENT FOR MORE REFILLS)   . atorvastatin (LIPITOR) 10 MG tablet Take 10 mg by mouth daily. 06/03/2015: Received from: External Pharmacy  . bisoprolol (ZEBETA) 10 MG tablet Take 1 tablet (10 mg total) by mouth daily.   . budesonide-formoterol (SYMBICORT) 80-4.5 MCG/ACT inhaler Symbicort 80 mcg-4.5 mcg/actuation HFA aerosol inhaler  INHALE 2 PUFFS BY MOUTH TWICE A DAY   . calcium-vitamin D (OSCAL WITH D) 500-200 MG-UNIT per tablet Take 1 tablet by mouth daily with breakfast. (Patient taking differently: Take 2 tablets by mouth daily with breakfast. )   . CARTIA XT 180 MG 24 hr capsule take 1 capsule by mouth once daily   . celecoxib  (CELEBREX) 200 MG capsule Take 1 capsule by mouth every 3-4 days 04/26/2015: Received from: External Pharmacy Received Sig:   . cyanocobalamin (,VITAMIN B-12,) 1000 MCG/ML injection Inject 1 mL (1,000 mcg total) into the muscle every 30 (thirty) days.   . flecainide (TAMBOCOR) 50 MG tablet Take 1 tablet (50 mg total) by mouth 2 (two) times daily.   Marland Kitchen FLUAD 0.5 ML SUSY ADM 0.5ML IM UTD   . losartan (COZAAR) 100 MG tablet Take 100 mg by mouth daily.  02/19/2015: Received from: External Pharmacy  . metFORMIN (GLUCOPHAGE) 500 MG tablet 1 tablet 2 (two) times daily with a meal.    . omeprazole (PRILOSEC) 40 MG capsule take 1 capsule by mouth once daily BEFORE BREAKFAST   . PARoxetine (PAXIL) 20 MG tablet Take 20 mg by mouth daily.   . potassium chloride (K-DUR) 10 MEQ tablet Take 10 mEq by mouth daily.  04/26/2015: Received from: External Pharmacy Received Sig:   . XARELTO 20 MG TABS tablet TAKE 1 TABLET BY MOUTH EVERY DAY WITH SUPPER   . [DISCONTINUED] anastrozole (ARIMIDEX) 1 MG tablet TAKE 1 TABLET BY MOUTH EVERY DAY(NEED APPOINTMENT FOR MORE REFILLS)   . albuterol (PROVENTIL) (2.5 MG/3ML) 0.083% nebulizer solution Take 3 mLs (2.5 mg total) by nebulization every 4 (four) hours as needed for wheezing or shortness of breath (dx: J45.991). (Patient not taking: Reported on 05/21/2019)   . ALPRAZolam (XANAX) 0.5 MG tablet Take 0.5 mg by mouth 2 (two) times daily as needed for anxiety.  02/19/2015: Received from: External Pharmacy  . furosemide (LASIX) 40 MG tablet Take 40 mg by mouth 3 (three) times a week.  04/26/2015: Received from: External Pharmacy  . hydrOXYzine (ATARAX/VISTARIL) 25 MG tablet Take 1 tablet by mouth every 6 (six) hours as needed. Itching 04/26/2015: Received from: External Pharmacy Received Sig: take 1 tablet by mouth every 6 hours if needed for itching  .  nystatin [facility]) powder Apply topically 2 (two) times daily as needed.   Marland Kitchen PROAIR RESPICLICK 557 (90 Base) MCG/ACT AEPB INHALE 2 PUFFS BY  MOUTH EVERY 4 HOURS IF NEEDED (Patient not taking: Reported on 05/21/2019)   . promethazine (PHENERGAN) 25 MG tablet Take 25 mg by mouth every 4 (four) hours as needed for nausea or vomiting.    . temazepam (RESTORIL) 15 MG capsule Take 1 capsule (15 mg total) by mouth at bedtime as needed for sleep.   . [DISCONTINUED] cyclobenzaprine (FLEXERIL) 10 MG tablet Take 1 tablet by mouth 3 (three) times daily. As needed 02/01/2016: Received from: External Pharmacy Received Sig: take 1 tablet by mouth three times a day if needed for SPASMS  . [DISCONTINUED] GARCINIA CAMBOGIA-CHROMIUM PO Take 1 capsule by mouth daily.    . [DISCONTINUED] nystatin [facility]) powder Apply topically 2 (two) times daily as needed. (Patient not taking: Reported on 05/21/2019)    Facility-Administered Encounter Medications as of 05/21/2019  Medication  . 0.9 %  sodium chloride infusion    ALLERGIES:  Allergies  Allergen Reactions  . Shellfish Allergy Hives and Shortness Of Breath  . Ace Inhibitors Cough  . Aminophylline Nausea And Vomiting    Heart races and pounds  . Epinephrine Other (See Comments)    Pallpitations during dental procedures  . Theophyllines Nausea And Vomiting and Other (See Comments)    Heart races and pounds  . Propoxyphene Hcl Nausea And Vomiting     PHYSICAL EXAM:  ECOG Performance status: 1  Vitals:   05/21/19 1324  BP: 117/66  Pulse: 74  Resp: 18  Temp: 97.9 F (36.6 C)  SpO2: 95%   Filed Weights   05/21/19 1324  Weight: 207 lb (93.9 kg)    Physical Exam Constitutional:      Appearance: Normal appearance. She is normal weight.  Cardiovascular:     Rate and Rhythm: Normal rate and regular rhythm.     Heart sounds: Normal heart sounds.  Pulmonary:     Effort: Pulmonary effort is normal.     Breath sounds: Normal breath sounds.  Abdominal:     General: Bowel sounds are normal.     Palpations: Abdomen is soft.  Musculoskeletal: Normal range of motion.  Skin:    General: Skin is  warm and dry.  Neurological:     Mental Status: She is alert and oriented to person, place, and time. Mental status is at baseline.  Psychiatric:        Mood and Affect: Mood normal.        Behavior: Behavior normal.        Thought Content: Thought content normal.        Judgment: Judgment normal.      LABORATORY DATA:  I have reviewed the labs as listed.  CBC    Component Value Date/Time   WBC 7.9 05/20/2019 1047   RBC 3.31 (L) 05/20/2019 1047   HGB 9.4 (L) 05/20/2019 1047   HCT 30.8 (L) 05/20/2019 1047   PLT 324 05/20/2019 1047   MCV 93.1 05/20/2019 1047   MCH 28.4 05/20/2019 1047   MCHC 30.5 05/20/2019 1047   RDW 17.3 (H) 05/20/2019 1047   LYMPHSABS 0.9 05/20/2019 1047   MONOABS 0.5 05/20/2019 1047   EOSABS 0.2 05/20/2019 1047   BASOSABS 0.0 05/20/2019 1047   CMP Latest Ref Rng & Units 05/20/2019 01/24/2019 11/13/2018  Glucose 70 - 99 mg/dL 100(H) 106(H) 131(H)  BUN 8 - 23 mg/dL 26(H) 26(H) 23  Creatinine 0.44 - 1.00 mg/dL 1.26(H) 1.11(H) 1.30(H)  Sodium 135 - 145 mmol/L 137 138 138  Potassium 3.5 - 5.1 mmol/L 4.6 4.5 4.3  Chloride 98 - 111 mmol/L 101 104 102  CO2 22 - 32 mmol/L _0 Calcium 8.9 - 10.3 mg/dL 9.5 9.2 9.4  Total Protein 6.5 - 8.1 g/dL 7.4 7.4 7.5  Total Bilirubin 0.3 - 1.2 mg/dL 0.7 0.3 0.5  Alkaline Phos 38 - 126 U/L 58 54 55  AST 15 - 41 U/L 14(L) 14(L) 16  ALT 0 - 44 U/L _1 I personally performed a face-to-face visit.  All questions were answered to patient's stated satisfaction. Encouraged patient to call with any new concerns or questions before his next visit to the cancer center and we can certain see him sooner, if needed.     ASSESSMENT & PLAN:   Breast cancer of upper-inner quadrant of left female breast 1.  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 - She was treated with a left breast lumpectomy by Dr. Dalbert Batman. -  Followed by adjuvant radiation therapy with Dr. Pablo Ledger.  Completed radiation 04/2015. - She started antiestrogen therapy with anastrozole in 05/2015.  The plan is to continue this therapy for 10 years. - Her Oncotype DX was low risk score.  Patient was to continue therapy for 10 years. - Her mammogram will be done 02/2019 which was BI-RADS Category 0 incomplete.  Ultrasound was done which showed scar tissue.  She will repeat in 1 year with mammogram. - We will continue to follow her every 6 months with labs.   2.  Osteopenia: - Her last DEXA scan done on 04/2018 showed osteopenia, with a T score of -1.4 -She is receiving Prolia every 6 months.  -She received her injection today 05/21/2019 -She is continuing to take calcium and vitamin D. - She will have a repeat DEXA scan in 04/2020  3.  Anemia: -Likely due from CKD. - Labs on 05/20/2019 showed her hemoglobin 9.4, ferritin 112, percent saturation 14, platelets 324, WBC 7.9 -I have recommended 2 infusions of Feraheme 1 week apart. - She will return to the clinic in 3 months for follow-up labs.  5.  B12 deficiency: - Labs on 01/24/2019 showed her B12 at 185. -Labs on 05/20/2019 showed her B12 level came up to 1376. - She is supposed to be given herself monthly B12 injections however she forgets to do this often. - I have started her on oral B12 daily to see if that helps since she forgets the injections and does not like doing them. -We will follow-up with labs in 3 months.  6.  Vitamin D deficiency: - Labs on 01/24/2019 showed her vitamin D level 29.5. -She takes vitamin D daily. - I have increased her dose to twice a day. - We will follow-up in 3 months with labs  7.  Atrial flutter: -Patient is on Xarelto. -Patient follows up with PCP or cardiology as recommended.      Orders placed this encounter:  Orders Placed This Encounter  Procedures  . Lactate dehydrogenase  . CBC with Differential/Platelet  . Comprehensive metabolic panel   . Ferritin  . Iron and TIBC  . Vitamin B12  . VITAMIN D 25 Hydroxy (Vit-D Deficiency, Fractures)      Francene Finders, FNP-C [place] [phone]

## 2019-05-21 NOTE — Progress Notes (Signed)
Patient taking calcium as directed.  Denied tooth, jaw, and leg pain.  No recent or upcoming dental visits.  Patient tolerated injection with no complaints voiced.  Site clean and dry with no bruising or swelling noted at site.  Band aid applied.  Vss with discharge and left by wheelchair with no s/s of distress noted.

## 2019-05-28 ENCOUNTER — Encounter (HOSPITAL_COMMUNITY): Payer: Self-pay | Admitting: Nurse Practitioner

## 2019-05-28 ENCOUNTER — Ambulatory Visit (HOSPITAL_COMMUNITY)
Admission: RE | Admit: 2019-05-28 | Discharge: 2019-05-28 | Disposition: A | Discharge status: Home / Self Care | Payer: Medicare Other | Source: Ambulatory Visit | Attending: Nurse Practitioner | Admitting: Nurse Practitioner

## 2019-05-28 ENCOUNTER — Other Ambulatory Visit: Payer: Self-pay

## 2019-05-28 DIAGNOSIS — I4891 Unspecified atrial fibrillation: Secondary | ICD-10-CM | POA: Insufficient documentation

## 2019-05-28 DIAGNOSIS — R9431 Abnormal electrocardiogram [ECG] [EKG]: Secondary | ICD-10-CM | POA: Diagnosis not present

## 2019-05-28 NOTE — Progress Notes (Signed)
Pt in for EKG after restart of flecainide 50 mg bid and stopping weight loss supplement. EKG read out is junctional rhythm but I feel I see somewhat of a flat p wave, regular rhythm.  Pt feels improved. qtc readout  at 513 ms, but does not look that long. So will bring back in one week to recheck EKG. BP is 132/64 and HR is 65 bpm.

## 2019-05-30 ENCOUNTER — Encounter (HOSPITAL_COMMUNITY): Payer: Self-pay

## 2019-05-30 ENCOUNTER — Inpatient Hospital Stay (HOSPITAL_COMMUNITY): Payer: Medicare Other | Attending: Hematology

## 2019-05-30 ENCOUNTER — Other Ambulatory Visit: Payer: Self-pay

## 2019-05-30 VITALS — BP 130/57 | HR 65 | Temp 98.3°F | Resp 20

## 2019-05-30 DIAGNOSIS — Z79899 Other long term (current) drug therapy: Secondary | ICD-10-CM

## 2019-05-30 DIAGNOSIS — D509 Iron deficiency anemia, unspecified: Secondary | ICD-10-CM | POA: Insufficient documentation

## 2019-05-30 DIAGNOSIS — C50212 Malignant neoplasm of upper-inner quadrant of left female breast: Secondary | ICD-10-CM

## 2019-05-30 DIAGNOSIS — M8580 Other specified disorders of bone density and structure, unspecified site: Secondary | ICD-10-CM

## 2019-05-30 DIAGNOSIS — Z170 Estrogen receptor positive status [ER+]: Secondary | ICD-10-CM

## 2019-05-30 MED ORDER — SODIUM CHLORIDE 0.9 % IV SOLN
INTRAVENOUS | Status: DC
  Administered 2019-05-30: 14:00:00 via INTRAVENOUS

## 2019-05-30 MED ORDER — SODIUM CHLORIDE 0.9 % IV SOLN
510.0000 mg | Freq: Once | INTRAVENOUS | Status: AC
  Administered 2019-05-30: 510 mg via INTRAVENOUS
  Filled 2019-05-30: qty 510

## 2019-05-30 NOTE — Patient Instructions (Signed)
[facility] Discharge Instructions  Received Feraheme infusion today. Follow-up as scheduled. Call clinic for any questions or concerns   Thank you for choosing [facility] at [facility] to provide your oncology and hematology care.  To afford each patient quality time with our provider, please arrive at least 15 minutes before your scheduled appointment time.   If you have a lab appointment with the Cancer Center please come in thru the Main Entrance and check in at the main information desk.  You need to re-schedule your appointment should you arrive 10 or more minutes late.  We strive to give you quality time with our providers, and arriving late affects you and other patients whose appointments are after yours.  Also, if you no show three or more times for appointments you may be dismissed from the clinic at the providers discretion.     Again, thank you for choosing [facility].  Our hope is that these requests will decrease the amount of time that you wait before being seen by our physicians.       _____________________________________________________________  Should you have questions after your visit to  Cancer Center, please contact our office at [phone] between the hours of 8:00 a.m. and 4:30 p.m.  Voicemails left after 4:00 p.m. will not be returned until the following business day.  For prescription refill requests, have your pharmacy contact our office and allow 72 hours.    Due to Covid, you will need to wear a mask upon entering the hospital. If you do not have a mask, a mask will be given to you at the Main Entrance upon arrival. For doctor visits, patients may have 1 support person with them. For treatment visits, patients can not have anyone with them due to social distancing guidelines and our immunocompromised population.     

## 2019-05-30 NOTE — Progress Notes (Signed)
[name] tolerated Feraheme infusion well without complaints or incident. Peripheral IV site checked with positive blood return noted prior to and after infusion. VSS upon discharge. Pt discharged via wheelchair in satisfactory condition

## 2019-06-06 ENCOUNTER — Encounter (HOSPITAL_COMMUNITY): Payer: Medicare Other | Admitting: Nurse Practitioner

## 2019-06-06 ENCOUNTER — Ambulatory Visit (HOSPITAL_COMMUNITY): Payer: Medicare Other

## 2019-06-13 ENCOUNTER — Ambulatory Visit (HOSPITAL_COMMUNITY)
Admission: RE | Admit: 2019-06-13 | Discharge: 2019-06-13 | Disposition: A | Discharge status: Home / Self Care | Payer: Medicare Other | Source: Ambulatory Visit | Attending: Nurse Practitioner | Admitting: Nurse Practitioner

## 2019-06-13 ENCOUNTER — Encounter (HOSPITAL_COMMUNITY): Payer: Self-pay | Admitting: Nurse Practitioner

## 2019-06-13 ENCOUNTER — Other Ambulatory Visit: Payer: Self-pay

## 2019-06-13 ENCOUNTER — Other Ambulatory Visit (HOSPITAL_COMMUNITY): Payer: Self-pay | Admitting: *Deleted

## 2019-06-13 VITALS — BP 152/68 | HR 110

## 2019-06-13 DIAGNOSIS — Z7901 Long term (current) use of anticoagulants: Secondary | ICD-10-CM | POA: Insufficient documentation

## 2019-06-13 DIAGNOSIS — M797 Fibromyalgia: Secondary | ICD-10-CM | POA: Diagnosis not present

## 2019-06-13 DIAGNOSIS — M1990 Unspecified osteoarthritis, unspecified site: Secondary | ICD-10-CM | POA: Diagnosis not present

## 2019-06-13 DIAGNOSIS — Z79899 Other long term (current) drug therapy: Secondary | ICD-10-CM | POA: Diagnosis not present

## 2019-06-13 DIAGNOSIS — J449 Chronic obstructive pulmonary disease, unspecified: Secondary | ICD-10-CM | POA: Diagnosis not present

## 2019-06-13 DIAGNOSIS — K219 Gastro-esophageal reflux disease without esophagitis: Secondary | ICD-10-CM | POA: Insufficient documentation

## 2019-06-13 DIAGNOSIS — I480 Paroxysmal atrial fibrillation: Secondary | ICD-10-CM

## 2019-06-13 DIAGNOSIS — G4733 Obstructive sleep apnea (adult) (pediatric): Secondary | ICD-10-CM | POA: Diagnosis not present

## 2019-06-13 DIAGNOSIS — I4892 Unspecified atrial flutter: Secondary | ICD-10-CM | POA: Insufficient documentation

## 2019-06-13 DIAGNOSIS — E785 Hyperlipidemia, unspecified: Secondary | ICD-10-CM | POA: Diagnosis not present

## 2019-06-13 DIAGNOSIS — Z791 Long term (current) use of non-steroidal anti-inflammatories (NSAID): Secondary | ICD-10-CM | POA: Diagnosis not present

## 2019-06-13 DIAGNOSIS — F419 Anxiety disorder, unspecified: Secondary | ICD-10-CM | POA: Insufficient documentation

## 2019-06-13 DIAGNOSIS — Z87891 Personal history of nicotine dependence: Secondary | ICD-10-CM | POA: Insufficient documentation

## 2019-06-13 DIAGNOSIS — Z7984 Long term (current) use of oral hypoglycemic drugs: Secondary | ICD-10-CM | POA: Diagnosis not present

## 2019-06-13 DIAGNOSIS — I10 Essential (primary) hypertension: Secondary | ICD-10-CM | POA: Diagnosis not present

## 2019-06-13 DIAGNOSIS — I484 Atypical atrial flutter: Secondary | ICD-10-CM | POA: Diagnosis not present

## 2019-06-13 DIAGNOSIS — I4891 Unspecified atrial fibrillation: Secondary | ICD-10-CM | POA: Insufficient documentation

## 2019-06-13 DIAGNOSIS — I4819 Other persistent atrial fibrillation: Secondary | ICD-10-CM

## 2019-06-13 MED ORDER — FLECAINIDE ACETATE 100 MG PO TABS: 100.0000 mg | ORAL_TABLET | Freq: Two times a day (BID) | ORAL | 6 refills | Status: DC

## 2019-06-13 MED ORDER — DILTIAZEM HCL ER COATED BEADS 240 MG PO CP24: 240.0000 mg | ORAL_CAPSULE | Freq: Every day | ORAL | 11 refills | Status: DC

## 2019-06-13 NOTE — Progress Notes (Signed)
Primary Care Physician: [name], MD Referring Physician: Dr. Rayann Heman Cardiologist: Dr. Martinique   [name] is a 68 y.o. female with a h/o morbid obesity, persistent afib that is in the afib clinic for f/u. She  Is found to be  in afib this am. Pt was unaware. She states that she missed a week of flecainide 2/2 refill issues and has been back on it x 2 days. She has also been taking a triple supplement consisting of garcinia/cambogia/chromium,for weight loss.  all of which can contribute to heart irregularity. She has been taking x 2 months and has lost 12 lbs. No issues with  xarelto 20 mg qd, although her H/H has been running lower over the last few months and is getting intermittent RBC infusions at the [facility].. She does not report any breakthrough afib, no bleeding issues. She continues on cpap.    Return for EKG 9/18. She is in atrial flutter. She cannot tell if she is persistent or not. She increased her flecainide to 100 mg bid one week ago  with normal appearing intervals. She did miss one dose of xarelto 9/10. She has a Investment banker, operational and most strips show possible afib but there is so much artifact on the strips that it is hard to say underlying rhythm.   Today, she denies symptoms of palpitations, chest pain, shortness of breath, orthopnea, PND, lower extremity edema, dizziness, presyncope, syncope, or neurologic sequela. The patient is tolerating medications without difficulties and is otherwise without complaint today.   Past Medical History:  Diagnosis Date  . Acute renal failure ([place]) 01/27/2015  . Acute respiratory failure ([place]) 01/27/2015  . Adenomatous colon polyp 1993  . Allergy   . Anemia   . Anxiety   . Anxiety disorder   . Asthma    Spirometry 1989: FEV1 2.12 (71%) with ratio 82.  HFA 90% after coaching 11-09-10  . Atrial flutter ([place]) chadsvasc of 3 (11/19/14)   Status post cardioversion 2-12 2013. s/p RFCA 5/13 with JAllred (coumadin and amio stopped)   .  Breast cancer ([place])    left  . COPD (chronic obstructive pulmonary disease) ([place])   . DDD (degenerative disc disease)   . Diverticulosis   . Essential hypertension, benign   . Fibromyalgia   . GERD (gastroesophageal reflux disease)    EGD with HH / esophagitis 06-30-93  . Hiatal hernia   . History of atrial fibrillation   . Hyperlipidemia   . Internal and external hemorrhoids without complication   . Lipoma of colon   . Migraine   . Osteoarthritis   . Pelvic fracture ([place])   . Personal history of colonic polyps 06/14/2010  . Pneumonia   . PONV (postoperative nausea and vomiting)   . Radiation    left breast cancer  . Sleep apnea    cpap   Past Surgical History:  Procedure Laterality Date  . A FLUTTER ABLATION  02/22/2012  . ATRIAL FLUTTER ABLATION N/A 02/22/2012   Procedure: ATRIAL FLUTTER ABLATION;  Surgeon: Thompson Grayer, MD;  Location: [facility] C[facility];  Service: Cardiovascular;  Laterality: N/A;  . BREAST BIOPSY Left 11/2014  . CARDIOVERSION N/A 02/19/2014   Procedure: CARDIOVERSION;  Surgeon: Dorothy Spark, MD;  Location: [facility];  Service: Cardiovascular;  Laterality: N/A;  . COLONOSCOPY  05/2010, 07/2000  . DILATION AND CURETTAGE OF UTERUS    . ESOPHAGOGASTRODUODENOSCOPY  05/2010, 06/1993  . JOINT REPLACEMENT    . POLYPECTOMY    .  RADIOACTIVE SEED GUIDED PARTIAL MASTECTOMY WITH AXILLARY SENTINEL LYMPH NODE BIOPSY Left 01/26/2015   Procedure: LEFT  PARTIAL MASTECTOMY WITH  RADIOACTIVE SEED LOCALIZATION, LEFT AXILLARY SENTINEL LYMPH NODE BIOPSY;  Surgeon: Fanny Skates, MD;  Location: [facility];  Service: General;  Laterality: Left;  . ROTATOR CUFF REPAIR     bilateral  . ROTATOR CUFF REPAIR     bil  . TOTAL KNEE ARTHROPLASTY     x2 09/2006  . UPPER GASTROINTESTINAL ENDOSCOPY      Current Outpatient Medications  Medication Sig Dispense Refill  . albuterol (PROVENTIL) (2.5 MG/3ML) 0.083% nebulizer solution Take 3 mLs (2.5 mg total) by nebulization every 4 (four) hours as needed  for wheezing or shortness of breath (dx: J45.991). 100 vial 5  . ALPRAZolam (XANAX) 0.5 MG tablet Take 0.5 mg by mouth 2 (two) times daily as needed for anxiety.   0  . anastrozole (ARIMIDEX) 1 MG tablet TAKE 1 TABLET BY MOUTH EVERY DAY(NEED APPOINTMENT FOR MORE REFILLS) 90 tablet 3  . atorvastatin (LIPITOR) 10 MG tablet Take 10 mg by mouth daily.  0  . bisoprolol (ZEBETA) 10 MG tablet Take 1 tablet (10 mg total) by mouth daily. 30 tablet 2  . budesonide-formoterol (SYMBICORT) 80-4.5 MCG/ACT inhaler Symbicort 80 mcg-4.5 mcg/actuation HFA aerosol inhaler  INHALE 2 PUFFS BY MOUTH TWICE A DAY    . calcium-vitamin D (OSCAL WITH D) 500-200 MG-UNIT per tablet Take 1 tablet by mouth daily with breakfast. (Patient taking differently: Take 2 tablets by mouth daily with breakfast. ) 30 tablet 3  . celecoxib (CELEBREX) 200 MG capsule Take 1 capsule by mouth every 3-4 days  0  . cyanocobalamin (,VITAMIN B-12,) 1000 MCG/ML injection Inject 1 mL (1,000 mcg total) into the muscle every 30 (thirty) days. 10 mL 0  . diltiazem (CARTIA XT) 240 MG 24 hr capsule Take 1 capsule (240 mg total) by mouth daily. 30 capsule 11  . flecainide (TAMBOCOR) 100 MG tablet Take 1 tablet (100 mg total) by mouth 2 (two) times daily. 60 tablet 6  . FLUAD 0.5 ML SUSY ADM 0.5ML IM UTD    . furosemide (LASIX) 40 MG tablet Take 40 mg by mouth 3 (three) times a week.   0  . hydrOXYzine (ATARAX/VISTARIL) 25 MG tablet Take 1 tablet by mouth every 6 (six) hours as needed. Itching  0  . losartan (COZAAR) 100 MG tablet Take 100 mg by mouth daily.   1  . metFORMIN (GLUCOPHAGE) 500 MG tablet 1 tablet 2 (two) times daily with a meal.     . nystatin (NYAMYC) powder Apply topically 2 (two) times daily as needed. 60 g 2  . omeprazole (PRILOSEC) 40 MG capsule take 1 capsule by mouth once daily BEFORE BREAKFAST 90 capsule 0  . PARoxetine (PAXIL) 20 MG tablet Take 20 mg by mouth daily.    . potassium chloride (K-DUR) 10 MEQ tablet Take 10 mEq by mouth  daily.   0  . PROAIR RESPICLICK 123XX123 (90 Base) MCG/ACT AEPB INHALE 2 PUFFS BY MOUTH EVERY 4 HOURS AS NEEDED 1 each 2  . promethazine (PHENERGAN) 25 MG tablet Take 25 mg by mouth every 4 (four) hours as needed for nausea or vomiting.     . SYMBICORT 160-4.5 MCG/ACT inhaler     . temazepam (RESTORIL) 15 MG capsule Take 1 capsule (15 mg total) by mouth at bedtime as needed for sleep. 30 capsule 0  . temazepam (RESTORIL) 30 MG capsule     .  XARELTO 20 MG TABS tablet TAKE 1 TABLET BY MOUTH EVERY DAY WITH SUPPER 30 tablet 6  . XARELTO 20 MG TABS tablet      No current facility-administered medications for this encounter.    Facility-Administered Medications Ordered in Other Encounters  Medication Dose Route Frequency Provider Last Rate Last Dose  . 0.9 %  sodium chloride infusion   Intravenous Continuous Penland, Kelby Fam, MD        Allergies  Allergen Reactions  . Shellfish Allergy Hives and Shortness Of Breath  . Ace Inhibitors Cough  . Aminophylline Nausea And Vomiting    Heart races and pounds  . Epinephrine Other (See Comments)    Pallpitations during dental procedures  . Theophyllines Nausea And Vomiting and Other (See Comments)    Heart races and pounds  . Propoxyphene Hcl Nausea And Vomiting    Social History   Socioeconomic History  . Marital status: Widowed    Spouse name: Not on file  . Number of children: 0  . Years of education: Not on file  . Highest education level: Not on file  Occupational History  . Occupation: Database administrator at [employer]  . Financial resource strain: Not on file  . Food insecurity    Worry: Not on file    Inability: Not on file  . Transportation needs    Medical: Not on file    Non-medical: Not on file  Tobacco Use  . Smoking status: Former Smoker    Packs/day: 1.00    Years: 20.00    Pack years: 20.00    Types: Cigarettes    Quit date: 09/25/1984    Years since quitting: 34.7  . Smokeless tobacco:  Never Used  Substance and Sexual Activity  . Alcohol use: No    Alcohol/week: 0.0 standard drinks  . Drug use: No  . Sexual activity: Not Currently  Lifestyle  . Physical activity    Days per week: Not on file    Minutes per session: Not on file  . Stress: Not on file  Relationships  . Social Herbalist on phone: Not on file    Gets together: Not on file    Attends religious service: Not on file    Active member of club or organization: Not on file    Attends meetings of clubs or organizations: Not on file    Relationship status: Not on file  . Intimate partner violence    Fear of current or ex partner: Not on file    Emotionally abused: Not on file    Physically abused: Not on file    Forced sexual activity: Not on file  Other Topics Concern  . Not on file  Social History Narrative  . Not on file    Family History  Problem Relation Age of Onset  . Emphysema Father        smoker  . Heart disease Father   . Breast cancer Mother 61  . Breast cancer Sister 36  . Heart Problems Sister   . Heart disease Sister   . Breast cancer Maternal Aunt 48  . Lung cancer Maternal Uncle 18       smoker  . Colon cancer Paternal Uncle 13  . Diabetes Maternal Grandmother   . Heart Problems Maternal Grandfather   . Stroke Paternal Grandfather   . Lung cancer Maternal Uncle 72       smoker  . Heart Problems  Paternal Uncle   . Diabetes Paternal Grandmother   . Rectal cancer Neg Hx   . Stomach cancer Neg Hx   . Esophageal cancer Neg Hx     ROS- All systems are reviewed and negative except as per the HPI above  Physical Exam: Vitals:   06/13/19 0937  BP: (!) 152/68  Pulse: (!) 110   Wt Readings from Last 3 Encounters:  05/21/19 93.9 kg  05/14/19 116 kg  01/31/19 115.3 kg    Labs: Lab Results  Component Value Date   NA 137 05/20/2019   K 4.6 05/20/2019   CL 101 05/20/2019   CO2 27 05/20/2019   GLUCOSE 100 (H) 05/20/2019   BUN 26 (H) 05/20/2019   CREATININE  1.26 (H) 05/20/2019   CALCIUM 9.5 05/20/2019   PHOS 5.4 (H) 01/28/2015   MG 2.0 01/28/2015   Lab Results  Component Value Date   INR 1.18 01/25/2015   No results found for: CHOL, HDL, LDLCALC, TRIG   GEN- The patient is well appearing, alert and oriented x 3 today.   Head- normocephalic, atraumatic Eyes-  Sclera clear, conjunctiva pink Ears- hearing intact Oropharynx- clear Neck- supple, no JVP Lymph- no cervical lymphadenopathy Lungs- Clear to ausculation bilaterally, normal work of breathing Heart-irregular rate and rhythm, no murmurs, rubs or gallops, PMI not laterally displaced GI- soft, NT, ND, + BS Extremities- no clubbing, cyanosis, or edema MS- no significant deformity or atrophy Skin- no rash or lesion Psych- euthymic mood, full affect Neuro- strength and sensation are intact  EKG-atrial flutter at 110 bpm, qrs int 96 ms, qtc 454 ms Epic records reviewed   Assessment and Plan: 1. Afib/flutter Tolerating ok S/p ablation 01/2012 In aflutter today with RVR Continue flecainide 100 mg bid  Continue bisoprolol 10 mg qd Increase Cardizem to 240 mg daily Continue xarelto 20 mg qd, missed a dose 9/10 Zio patch to confirm persistent arrhythmia, would not be eligible for DCCV until 10/1  2. OSA Continue compliance with CPAP  3. Morbid obesity Weight loss encouraged, but not thru supplements  4. HTN Elevated Increase in Cardizem should help  F/u in afib clinic in 2 weeks  Butch Penny C. Meiya Wisler, [facility] [address] [place], [place] [address] [phone]

## 2019-06-13 NOTE — Patient Instructions (Signed)
Increase cardizem to 240mg  once a day Increase flecainide to 100mg  a day

## 2019-06-17 ENCOUNTER — Other Ambulatory Visit: Payer: Self-pay | Admitting: Adult Health

## 2019-06-17 MED ORDER — SYMBICORT 160-4.5 MCG/ACT IN AERO: 2.0000 | INHALATION_SPRAY | Freq: Two times a day (BID) | RESPIRATORY_TRACT | 1 refills | Status: DC

## 2019-06-17 NOTE — Telephone Encounter (Signed)
Received faxed refill request from [facility] in [place]  Medication name/strength/dose: Symbicort 160 Medication last rx'd: 1.31.2020 Quantity and number of refills last rx'd: #1 with 3 refills  Patient last seen in the office on 1.31.2020  Does refill need to be authorized by a provider? no Refill authorized (yes or no)?: Yes, with note that patient is due for follow up.

## 2019-06-19 ENCOUNTER — Encounter (HOSPITAL_COMMUNITY): Payer: Self-pay

## 2019-06-19 ENCOUNTER — Other Ambulatory Visit: Payer: Self-pay

## 2019-06-19 ENCOUNTER — Inpatient Hospital Stay (HOSPITAL_COMMUNITY): Payer: Medicare Other

## 2019-06-19 VITALS — BP 117/66 | HR 52 | Temp 97.5°F | Resp 20

## 2019-06-19 DIAGNOSIS — C50212 Malignant neoplasm of upper-inner quadrant of left female breast: Secondary | ICD-10-CM

## 2019-06-19 DIAGNOSIS — Z170 Estrogen receptor positive status [ER+]: Secondary | ICD-10-CM

## 2019-06-19 DIAGNOSIS — M8580 Other specified disorders of bone density and structure, unspecified site: Secondary | ICD-10-CM

## 2019-06-19 DIAGNOSIS — D509 Iron deficiency anemia, unspecified: Secondary | ICD-10-CM | POA: Diagnosis not present

## 2019-06-19 DIAGNOSIS — Z79899 Other long term (current) drug therapy: Secondary | ICD-10-CM

## 2019-06-19 MED ORDER — SODIUM CHLORIDE 0.9 % IV SOLN
510.0000 mg | Freq: Once | INTRAVENOUS | Status: AC
  Administered 2019-06-19: 510 mg via INTRAVENOUS
  Filled 2019-06-19: qty 510

## 2019-06-19 MED ORDER — SODIUM CHLORIDE 0.9 % IV SOLN
INTRAVENOUS | Status: DC
  Administered 2019-06-19: 14:00:00 via INTRAVENOUS

## 2019-06-19 NOTE — Patient Instructions (Signed)
[facility] Discharge Instructions  Received Feraheme infusion today. Follow-up as scheduled. Call clinic for any questions or concerns   Thank you for choosing  Can[facility] Center at [facility] to provide your oncology and hematology care.  To afford each patient quality time with our provider, please arrive at least 15 minutes before your scheduled appointment time.   If you have a lab appointment with the Cancer Center please come in thru the Main Entrance and check in at the main information desk.  You need to re-schedule your appointment should you arrive 10 or more minutes late.  We strive to give you quality time with our providers, and arriving late affects you and other patients whose appointments are after yours.  Also, if you no show three or more times for appointments you may be dismissed from the clinic at the providers discretion.     Again, thank you for choosing [facility].  Our hope is that these requests will decrease the amount of time that you wait before being seen by our physicians.       _____________________________________________________________  Should you have questions after your visit to [facility], please contact our office at [phone] between the hours of 8:00 a.m. and 4:30 p.m.  Voicemails left after 4:00 p.m. will not be returned until the following business day.  For prescription refill requests, have your pharmacy contact our office and allow 72 hours.    Due to Covid, you will need to wear a mask upon entering the hospital. If you do not have a mask, a mask will be given to you at the Main Entrance upon arrival. For doctor visits, patients may have 1 support person with them. For treatment visits, patients can not have anyone with them due to social distancing guidelines and our immunocompromised population.     

## 2019-06-19 NOTE — Progress Notes (Signed)
[name] tolerated Feraheme infusion well without complaints or incident. VSS upon discharge. Pt discharged via wheelchair in satisfactory condition

## 2019-06-26 ENCOUNTER — Ambulatory Visit (HOSPITAL_COMMUNITY): Payer: Medicare Other

## 2019-07-03 ENCOUNTER — Encounter (HOSPITAL_COMMUNITY): Payer: Self-pay | Admitting: Nurse Practitioner

## 2019-07-03 ENCOUNTER — Other Ambulatory Visit: Payer: Self-pay

## 2019-07-03 ENCOUNTER — Ambulatory Visit (HOSPITAL_COMMUNITY)
Admission: RE | Admit: 2019-07-03 | Discharge: 2019-07-03 | Disposition: A | Discharge status: Home / Self Care | Payer: Medicare Other | Source: Ambulatory Visit | Attending: Nurse Practitioner | Admitting: Nurse Practitioner

## 2019-07-03 VITALS — BP 134/80 | HR 97 | Ht 66.0 in | Wt 262.2 lb

## 2019-07-03 DIAGNOSIS — I4819 Other persistent atrial fibrillation: Secondary | ICD-10-CM | POA: Diagnosis not present

## 2019-07-03 DIAGNOSIS — E785 Hyperlipidemia, unspecified: Secondary | ICD-10-CM | POA: Insufficient documentation

## 2019-07-03 DIAGNOSIS — Z87891 Personal history of nicotine dependence: Secondary | ICD-10-CM | POA: Insufficient documentation

## 2019-07-03 DIAGNOSIS — M810 Age-related osteoporosis without current pathological fracture: Secondary | ICD-10-CM | POA: Insufficient documentation

## 2019-07-03 DIAGNOSIS — F419 Anxiety disorder, unspecified: Secondary | ICD-10-CM | POA: Insufficient documentation

## 2019-07-03 DIAGNOSIS — Z79899 Other long term (current) drug therapy: Secondary | ICD-10-CM | POA: Insufficient documentation

## 2019-07-03 DIAGNOSIS — G4733 Obstructive sleep apnea (adult) (pediatric): Secondary | ICD-10-CM | POA: Diagnosis not present

## 2019-07-03 DIAGNOSIS — J449 Chronic obstructive pulmonary disease, unspecified: Secondary | ICD-10-CM | POA: Diagnosis not present

## 2019-07-03 DIAGNOSIS — J45909 Unspecified asthma, uncomplicated: Secondary | ICD-10-CM | POA: Diagnosis not present

## 2019-07-03 DIAGNOSIS — K219 Gastro-esophageal reflux disease without esophagitis: Secondary | ICD-10-CM | POA: Diagnosis not present

## 2019-07-03 DIAGNOSIS — I4892 Unspecified atrial flutter: Secondary | ICD-10-CM | POA: Insufficient documentation

## 2019-07-03 DIAGNOSIS — I10 Essential (primary) hypertension: Secondary | ICD-10-CM | POA: Diagnosis not present

## 2019-07-03 DIAGNOSIS — K449 Diaphragmatic hernia without obstruction or gangrene: Secondary | ICD-10-CM | POA: Insufficient documentation

## 2019-07-03 DIAGNOSIS — J9600 Acute respiratory failure, unspecified whether with hypoxia or hypercapnia: Secondary | ICD-10-CM | POA: Diagnosis not present

## 2019-07-03 DIAGNOSIS — M797 Fibromyalgia: Secondary | ICD-10-CM | POA: Insufficient documentation

## 2019-07-03 DIAGNOSIS — Z7984 Long term (current) use of oral hypoglycemic drugs: Secondary | ICD-10-CM | POA: Diagnosis not present

## 2019-07-03 DIAGNOSIS — D649 Anemia, unspecified: Secondary | ICD-10-CM | POA: Insufficient documentation

## 2019-07-03 DIAGNOSIS — Z8249 Family history of ischemic heart disease and other diseases of the circulatory system: Secondary | ICD-10-CM | POA: Diagnosis not present

## 2019-07-03 DIAGNOSIS — N179 Acute kidney failure, unspecified: Secondary | ICD-10-CM | POA: Diagnosis not present

## 2019-07-03 LAB — CBC
HCT: 32.5 % — ABNORMAL LOW (ref 36.0–46.0)
Hemoglobin: 10.3 g/dL — ABNORMAL LOW (ref 12.0–15.0)
MCH: 30.6 pg (ref 26.0–34.0)
MCHC: 31.7 g/dL (ref 30.0–36.0)
MCV: 96.4 fL (ref 80.0–100.0)
Platelets: 327 10*3/uL (ref 150–400)
RBC: 3.37 MIL/uL — ABNORMAL LOW (ref 3.87–5.11)
RDW: 15.5 % (ref 11.5–15.5)
WBC: 7.5 10*3/uL (ref 4.0–10.5)
nRBC: 0 % (ref 0.0–0.2)

## 2019-07-03 LAB — BASIC METABOLIC PANEL
Anion gap: 15 (ref 5–15)
BUN: 24 mg/dL — ABNORMAL HIGH (ref 8–23)
CO2: 22 mmol/L (ref 22–32)
Calcium: 9.3 mg/dL (ref 8.9–10.3)
Chloride: 100 mmol/L (ref 98–111)
Creatinine, Ser: 1.32 mg/dL — ABNORMAL HIGH (ref 0.44–1.00)
GFR calc Af Amer: 47 mL/min — ABNORMAL LOW (ref >60)
GFR calc non Af Amer: 41 mL/min — ABNORMAL LOW (ref >60)
Glucose, Bld: 181 mg/dL — ABNORMAL HIGH (ref 70–99)
Potassium: 4 mmol/L (ref 3.5–5.1)
Sodium: 137 mmol/L (ref 135–145)

## 2019-07-03 NOTE — Progress Notes (Addendum)
Primary Care Physician: [name], MD Referring Physician: Dr. Rayann Heman Cardiologist: Dr. Martinique   [name] is a 68 y.o. female with a h/o morbid obesity, persistent afib that is in the afib clinic for f/u. She  Is found to be  in afib this am. Pt was unaware. She states that she missed a week of flecainide 2/2 refill issues and has been back on it x 2 days. She has also been taking a triple supplement consisting of garcinia/cambogia/chromium,for weight loss.  all of which can contribute to heart irregularity. She has been taking x 2 months and has lost 12 lbs. No issues with  xarelto 20 mg qd, although her H/H has been running lower over the last few months and is getting intermittent [facility] infusions [facility].. She does not report any breakthrough afib, no bleeding issues. She continues on cpap.    Return for EKG 9/18. She is in atrial flutter. She cannot tell if she is persistent or not. She increased her flecainide to 100 mg bid one week ago  with normal appearing intervals. She did miss one dose of xarelto 9/10. She has a Investment banker, operational and most strips show possible afib but there is so much artifact on the strips that it is hard to say underlying rhythm.   Return to afib clinic 10/8. Monitor showed persistent afib. She has not missed any other doses of xarelto and is now eligible for cardioversion as of 10/1. She is wanting to proceed. She feels slightly more short of breath but denies any significant weight gain in afib.. I feel the weight listed from 8/26 was erroneous as she would have had significant weight gain. Her weight from  today and a few days prior on 8/19 in line with today's weight.  Today, she denies symptoms of palpitations, chest pain, shortness of breath, orthopnea, PND, lower extremity edema, dizziness, presyncope, syncope, or neurologic sequela. The patient is tolerating medications without difficulties and is otherwise without complaint today.   Past Medical  History:  Diagnosis Date  . Acute renal failure ([place]) 01/27/2015  . Acute respiratory failure ([place]) 01/27/2015  . Adenomatous colon polyp 1993  . Allergy   . Anemia   . Anxiety   . Anxiety disorder   . Asthma    Spirometry 1989: FEV1 2.12 (71%) with ratio 82.  HFA 90% after coaching 11-09-10  . Atrial flutter ([place]) chadsvasc of 3 (11/19/14)   Status post cardioversion 2-12 2013. s/p RFCA 5/13 with JAllred (coumadin and amio stopped)   . Breast cancer ([place])    left  . COPD (chronic obstructive pulmonary disease) ([place])   . DDD (degenerative disc disease)   . Diverticulosis   . Essential hypertension, benign   . Fibromyalgia   . GERD (gastroesophageal reflux disease)    EGD with HH / esophagitis 06-30-93  . Hiatal hernia   . History of atrial fibrillation   . Hyperlipidemia   . Internal and external hemorrhoids without complication   . Lipoma of colon   . Migraine   . Osteoarthritis   . Pelvic fracture ([place])   . Personal history of colonic polyps 06/14/2010  . Pneumonia   . PONV (postoperative nausea and vomiting)   . Radiation    left breast cancer  . Sleep apnea    cpap   Past Surgical History:  Procedure Laterality Date  . A FLUTTER ABLATION  02/22/2012  . ATRIAL FLUTTER ABLATION N/A 02/22/2012   Procedure: ATRIAL FLUTTER  ABLATION;  Surgeon: Thompson Grayer, MD;  Location: [facility] CATH LAB;  Service: Cardiovascular;  Laterality: N/A;  . BREAST BIOPSY Left 11/2014  . CARDIOVERSION N/A 02/19/2014   Procedure: CARDIOVERSION;  Surgeon: Dorothy Spark, MD;  Location: [place];  Service: Cardiovascular;  Laterality: N/A;  . COLONOSCOPY  05/2010, 07/2000  . DILATION AND CURETTAGE OF UTERUS    . ESOPHAGOGASTRODUODENOSCOPY  05/2010, 06/1993  . JOINT REPLACEMENT    . POLYPECTOMY    . RADIOACTIVE SEED GUIDED PARTIAL MASTECTOMY WITH AXILLARY SENTINEL LYMPH NODE BIOPSY Left 01/26/2015   Procedure: LEFT  PARTIAL MASTECTOMY WITH  RADIOACTIVE SEED LOCALIZATION, LEFT AXILLARY SENTINEL LYMPH NODE BIOPSY;   Surgeon: Fanny Skates, MD;  Location: [place];  Service: General;  Laterality: Left;  . ROTATOR CUFF REPAIR     bilateral  . ROTATOR CUFF REPAIR     bil  . TOTAL KNEE ARTHROPLASTY     x2 09/2006  . UPPER GASTROINTESTINAL ENDOSCOPY      Current Outpatient Medications  Medication Sig Dispense Refill  . albuterol (PROVENTIL) (2.5 MG/3ML) 0.083% nebulizer solution Take 3 mLs (2.5 mg total) by nebulization every 4 (four) hours as needed for wheezing or shortness of breath (dx: J45.991). 100 vial 5  . ALPRAZolam (XANAX) 0.5 MG tablet Take 0.5 mg by mouth 2 (two) times daily as needed for anxiety.   0  . amoxicillin (AMOXIL) 500 MG capsule TK ONE C PO TID    . anastrozole (ARIMIDEX) 1 MG tablet TAKE 1 TABLET BY MOUTH EVERY DAY(NEED APPOINTMENT FOR MORE REFILLS) 90 tablet 3  . atorvastatin (LIPITOR) 10 MG tablet Take 10 mg by mouth daily.  0  . bisoprolol (ZEBETA) 10 MG tablet Take 1 tablet (10 mg total) by mouth daily. 30 tablet 2  . calcium-vitamin D (OSCAL WITH D) 500-200 MG-UNIT per tablet Take 1 tablet by mouth daily with breakfast. (Patient taking differently: Take 2 tablets by mouth daily with breakfast. ) 30 tablet 3  . celecoxib (CELEBREX) 200 MG capsule Take 1 capsule by mouth every 3-4 days  0  . diltiazem (CARTIA XT) 240 MG 24 hr capsule Take 1 capsule (240 mg total) by mouth daily. 30 capsule 11  . flecainide (TAMBOCOR) 100 MG tablet Take 1 tablet (100 mg total) by mouth 2 (two) times daily. 60 tablet 6  . FLUAD 0.5 ML SUSY ADM 0.5ML IM UTD    . furosemide (LASIX) 40 MG tablet Take 40 mg by mouth 3 (three) times a week.   0  . hydrOXYzine (ATARAX/VISTARIL) 25 MG tablet Take 1 tablet by mouth every 6 (six) hours as needed. Itching  0  . losartan (COZAAR) 100 MG tablet Take 100 mg by mouth daily.   1  . metFORMIN (GLUCOPHAGE) 500 MG tablet 1 tablet 2 (two) times daily with a meal.     . nystatin (NYAMYC) powder Apply topically 2 (two) times daily as needed. 60 g 2  . omeprazole  (PRILOSEC) 40 MG capsule take 1 capsule by mouth once daily BEFORE BREAKFAST 90 capsule 0  . PARoxetine (PAXIL) 20 MG tablet Take 20 mg by mouth daily.    . potassium chloride (K-DUR) 10 MEQ tablet Take 10 mEq by mouth daily.   0  . PROAIR RESPICLICK 123XX123 (90 Base) MCG/ACT AEPB INHALE 2 PUFFS BY MOUTH EVERY 4 HOURS AS NEEDED 1 each 2  . promethazine (PHENERGAN) 25 MG tablet Take 25 mg by mouth every 4 (four) hours as needed for nausea or vomiting.     Marland Kitchen  SYMBICORT 160-4.5 MCG/ACT inhaler Inhale 2 puffs into the lungs 2 (two) times daily. NEEDS APPT FOR MORE REFILLS 1 Inhaler 1  . temazepam (RESTORIL) 15 MG capsule Take 1 capsule (15 mg total) by mouth at bedtime as needed for sleep. 30 capsule 0  . temazepam (RESTORIL) 30 MG capsule     . XARELTO 20 MG TABS tablet TAKE 1 TABLET BY MOUTH EVERY DAY WITH SUPPER 30 tablet 6   No current facility-administered medications for this encounter.    Facility-Administered Medications Ordered in Other Encounters  Medication Dose Route Frequency Provider Last Rate Last Dose  . 0.9 %  sodium chloride infusion   Intravenous Continuous Penland, Kelby Fam, MD        Allergies  Allergen Reactions  . Shellfish Allergy Hives and Shortness Of Breath  . Ace Inhibitors Cough  . Aminophylline Nausea And Vomiting    Heart races and pounds  . Epinephrine Other (See Comments)    Pallpitations during dental procedures  . Theophyllines Nausea And Vomiting and Other (See Comments)    Heart races and pounds  . Propoxyphene Hcl Nausea And Vomiting    Social History   Socioeconomic History  . Marital status: Widowed    Spouse name: Not on file  . Number of children: 0  . Years of education: Not on file  . Highest education level: Not on file  Occupational History  . Occupation: Database administrator at [employer]  . Financial resource strain: Not on file  . Food insecurity    Worry: Not on file    Inability: Not on file  .  Transportation needs    Medical: Not on file    Non-medical: Not on file  Tobacco Use  . Smoking status: Former Smoker    Packs/day: 1.00    Years: 20.00    Pack years: 20.00    Types: Cigarettes    Quit date: 09/25/1984    Years since quitting: 34.7  . Smokeless tobacco: Never Used  Substance and Sexual Activity  . Alcohol use: No    Alcohol/week: 0.0 standard drinks  . Drug use: No  . Sexual activity: Not Currently  Lifestyle  . Physical activity    Days per week: Not on file    Minutes per session: Not on file  . Stress: Not on file  Relationships  . Social Herbalist on phone: Not on file    Gets together: Not on file    Attends religious service: Not on file    Active member of club or organization: Not on file    Attends meetings of clubs or organizations: Not on file    Relationship status: Not on file  . Intimate partner violence    Fear of current or ex partner: Not on file    Emotionally abused: Not on file    Physically abused: Not on file    Forced sexual activity: Not on file  Other Topics Concern  . Not on file  Social History Narrative  . Not on file    Family History  Problem Relation Age of Onset  . Emphysema Father        smoker  . Heart disease Father   . Breast cancer Mother 44  . Breast cancer Sister 17  . Heart Problems Sister   . Heart disease Sister   . Breast cancer Maternal Aunt 48  . Lung cancer Maternal Uncle 32  smoker  . Colon cancer Paternal Uncle 22  . Diabetes Maternal Grandmother   . Heart Problems Maternal Grandfather   . Stroke Paternal Grandfather   . Lung cancer Maternal Uncle 72       smoker  . Heart Problems Paternal Uncle   . Diabetes Paternal Grandmother   . Rectal cancer Neg Hx   . Stomach cancer Neg Hx   . Esophageal cancer Neg Hx     ROS- All systems are reviewed and negative except as per the HPI above  Physical Exam: Vitals:   07/03/19 0904  BP: 134/80  Pulse: 97  Weight: 118.9 kg   Height: 5\' 6"  (1.676 m)   Wt Readings from Last 3 Encounters:  07/03/19 118.9 kg  05/21/19 93.9 kg  05/14/19 116 kg    Labs: Lab Results  Component Value Date   NA 137 05/20/2019   K 4.6 05/20/2019   CL 101 05/20/2019   CO2 27 05/20/2019   GLUCOSE 100 (H) 05/20/2019   BUN 26 (H) 05/20/2019   CREATININE 1.26 (H) 05/20/2019   CALCIUM 9.5 05/20/2019   PHOS 5.4 (H) 01/28/2015   MG 2.0 01/28/2015   Lab Results  Component Value Date   INR 1.18 01/25/2015   No results found for: CHOL, HDL, LDLCALC, TRIG   GEN- The patient is well appearing, alert and oriented x 3 today.   Head- normocephalic, atraumatic Eyes-  Sclera clear, conjunctiva pink Ears- hearing intact Oropharynx- clear Neck- supple, no JVP Lymph- no cervical lymphadenopathy Lungs- Clear to ausculation bilaterally, normal work of breathing Heart irrregular rate and rhythm, no murmurs, rubs or gallops, PMI not laterally displaced GI- soft, NT, ND, + BS Extremities- no clubbing, cyanosis, or edema MS- no significant deformity or atrophy Skin- no rash or lesion Psych- euthymic mood, full affect Neuro- strength and sensation are intact  EKG-atrial fib/ flutter at 97 bpm, qrs int 96 ms, qtc 454 ms Epic records reviewed Zio patch shows persistent afib, average 89 bpm    Assessment and Plan: 1. Afib  Tolerating ok S/p ablation 01/2012 In afib Continue flecainide 100 mg bid  Continue bisoprolol 10 mg qd Increase Cardizem to 240 mg daily Continue xarelto 20 mg qd, missed a dose 9/10 Now eligible for DCCV , will schedule, no further missed doses of anticoagulation Procedure explained to pt risk vrs benefit  bmet/mag  2. OSA Continue compliance with CPAP  3. Morbid obesity Weight loss encouraged, but not thru supplements  4. HTN Stable     F/u in afib clinic in 1 week after cardioversion   Will need EKG after cardioversion as I have increased flecainide to look at interval lengths   Butch Penny C.  Virtie Bungert, [facility] [address] [place], [place] [address] [phone]

## 2019-07-03 NOTE — Patient Instructions (Signed)
Cardioversion scheduled for Friday, October 16th  - Arrive at the [facility] and go to admitting at 11:30AM  -Do not eat or drink anything after midnight the night prior to your procedure.  - Take all your morning medication with a sip of water prior to arrival.  - You will not be able to drive home after your procedure.

## 2019-07-03 NOTE — H&P (View-Only) (Signed)
Primary Care Physician: [name], MD Referring Physician: Dr. Rayann Heman Cardiologist: Dr. Martin[place]   [name] is a 68 y.o. female with a h/o morbid obesity, persistent afib that is in the afib clinic for f/u. She  Is found to be  in afib this am. Pt was unaware. She states that she missed a week of flecainide 2/2 refill issues and has been back on it x 2 days. She has also been taking a triple supplement consisting of garcinia/cambogia/chromium,for weight loss.  all of which can contribute to heart irregularity. She has been taking x 2 months and has lost 12 lbs. No issues with  xarelto 20 mg qd, although her H/H has been running lower over the last few months and is getting intermittent RBC infusions at the [facility].. She does not report any breakthrough afib, no bleeding issues. She continues on cpap.    Return for EKG 9/18. She is in atrial flutter. She cannot tell if she is persistent or not. She increased her flecainide to 100 mg bid one week ago  with normal appearing intervals. She did miss one dose of xarelto 9/10. She has a Investment banker, operational and most strips show possible afib but there is so much artifact on the strips that it is hard to say underlying rhythm.   Return to afib clinic 10/8. Monitor showed persistent afib. She has not missed any other doses of xarelto and is now eligible for cardioversion as of 10/1. She is wanting to proceed. She feels slightly more short of breath but denies any significant weight gain in afib.. I feel the weight listed from 8/26 was erroneous as she would have had significant weight gain. Her weight from  today and a few days prior on 8/19 in line with today's weight.  Today, she denies symptoms of palpitations, chest pain, shortness of breath, orthopnea, PND, lower extremity edema, dizziness, presyncope, syncope, or neurologic sequela. The patient is tolerating medications without difficulties and is otherwise without complaint today.   Past Medical  History:  Diagnosis Date  . Acute renal failure ([place]) 01/27/2015  . Acute respiratory failure ([place]) 01/27/2015  . Adenomatous colon polyp 1993  . Allergy   . Anemia   . Anxiety   . Anxiety disorder   . Asthma    Spirometry 1989: FEV1 2.12 (71%) with ratio 82.  HFA 90% after coaching 11-09-10  . Atrial flutter ([place]) chadsvasc of 3 (11/19/14)   Status post cardioversion 2-12 2013. s/p RFCA 5/13 with JAllred (coumadin and amio stopped)   . Breast cancer ([place])    left  . COPD (chronic obstructive pulmonary disease) ([place])   . DDD (degenerative disc disease)   . Diverticulosis   . Essential hypertension, benign   . Fibromyalgia   . GERD (gastroesophageal reflux disease)    EGD with HH / esophagitis 06-30-93  . Hiatal hernia   . History of atrial fibrillation   . Hyperlipidemia   . Internal and external hemorrhoids without complication   . Lipoma of colon   . Migraine   . Osteoarthritis   . Pelvic fracture ([place])   . Personal history of colonic polyps 06/14/2010  . Pneumonia   . PONV (postoperative nausea and vomiting)   . Radiation    left breast cancer  . Sleep apnea    cpap   Past Surgical History:  Procedure Laterality Date  . A FLUTTER ABLATION  02/22/2012  . ATRIAL FLUTTER ABLATION N/A 02/22/2012   Procedure: ATRIAL FLUTTER  ABLATION;  Surgeon: Thompson Grayer, MD;  Location[facility] CATH LAB;  Service: Cardiovascular;  Laterality: N/A;  . BREAST BIOPSY Left 11/2014  . CARDIOVERSION N/A 02/19/2014   Procedure: CARDIOVERSION;  Surgeon: Dorothy Spark, MD;  Location: [place];  Service: Cardiovascular;  Laterality: N/A;  . COLONOSCOPY  05/2010, 07/2000  . DILATION AND CURETTAGE OF UTERUS    . ESOPHAGOGASTRODUODENOSCOPY  05/2010, 06/1993  . JOINT REPLACEMENT    . POLYPECTOMY    . RADIOACTIVE SEED GUIDED PARTIAL MASTECTOMY WITH AXILLARY SENTINEL LYMPH NODE BIOPSY Left 01/26/2015   Procedure: LEFT  PARTIAL MASTECTOMY WITH  RADIOACTIVE SEED LOCALIZATION, LEFT AXILLARY SENTINEL LYMPH NODE BIOPSY;   Surgeon: Fanny Skates, MD;  Location: [place];  Service: General;  Laterality: Left;  . ROTATOR CUFF REPAIR     bilateral  . ROTATOR CUFF REPAIR     bil  . TOTAL KNEE ARTHROPLASTY     x2 09/2006  . UPPER GASTROINTESTINAL ENDOSCOPY      Current Outpatient Medications  Medication Sig Dispense Refill  . albuterol (PROVENTIL) (2.5 MG/3ML) 0.083% nebulizer solution Take 3 mLs (2.5 mg total) by nebulization every 4 (four) hours as needed for wheezing or shortness of breath (dx: J45.991). 100 vial 5  . ALPRAZolam (XANAX) 0.5 MG tablet Take 0.5 mg by mouth 2 (two) times daily as needed for anxiety.   0  . amoxicillin (AMOXIL) 500 MG capsule TK ONE C PO TID    . anastrozole (ARIMIDEX) 1 MG tablet TAKE 1 TABLET BY MOUTH EVERY DAY(NEED APPOINTMENT FOR MORE REFILLS) 90 tablet 3  . atorvastatin (LIPITOR) 10 MG tablet Take 10 mg by mouth daily.  0  . bisoprolol (ZEBETA) 10 MG tablet Take 1 tablet (10 mg total) by mouth daily. 30 tablet 2  . calcium-vitamin D (OSCAL WITH D) 500-200 MG-UNIT per tablet Take 1 tablet by mouth daily with breakfast. (Patient taking differently: Take 2 tablets by mouth daily with breakfast. ) 30 tablet 3  . celecoxib (CELEBREX) 200 MG capsule Take 1 capsule by mouth every 3-4 days  0  . diltiazem (CARTIA XT) 240 MG 24 hr capsule Take 1 capsule (240 mg total) by mouth daily. 30 capsule 11  . flecainide (TAMBOCOR) 100 MG tablet Take 1 tablet (100 mg total) by mouth 2 (two) times daily. 60 tablet 6  . FLUAD 0.5 ML SUSY ADM 0.5ML IM UTD    . furosemide (LASIX) 40 MG tablet Take 40 mg by mouth 3 (three) times a week.   0  . hydrOXYzine (ATARAX/VISTARIL) 25 MG tablet Take 1 tablet by mouth every 6 (six) hours as needed. Itching  0  . losartan (COZAAR) 100 MG tablet Take 100 mg by mouth daily.   1  . metFORMIN (GLUCOPHAGE) 500 MG tablet 1 tablet 2 (two) times daily with a meal.     . nystatin (NYAMYC) powder Apply topically 2 (two) times daily as needed. 60 g 2  . omeprazole  (PRILOSEC) 40 MG capsule take 1 capsule by mouth once daily BEFORE BREAKFAST 90 capsule 0  . PARoxetine (PAXIL) 20 MG tablet Take 20 mg by mouth daily.    . potassium chloride (K-DUR) 10 MEQ tablet Take 10 mEq by mouth daily.   0  . PROAIR RESPICLICK 123XX123 (90 Base) MCG/ACT AEPB INHALE 2 PUFFS BY MOUTH EVERY 4 HOURS AS NEEDED 1 each 2  . promethazine (PHENERGAN) 25 MG tablet Take 25 mg by mouth every 4 (four) hours as needed for nausea or vomiting.     Marland Kitchen  SYMBICORT 160-4.5 MCG/ACT inhaler Inhale 2 puffs into the lungs 2 (two) times daily. NEEDS APPT FOR MORE REFILLS 1 Inhaler 1  . temazepam (RESTORIL) 15 MG capsule Take 1 capsule (15 mg total) by mouth at bedtime as needed for sleep. 30 capsule 0  . temazepam (RESTORIL) 30 MG capsule     . XARELTO 20 MG TABS tablet TAKE 1 TABLET BY MOUTH EVERY DAY WITH SUPPER 30 tablet 6   No current facility-administered medications for this encounter.    Facility-Administered Medications Ordered in Other Encounters  Medication Dose Route Frequency Provider Last Rate Last Dose  . 0.9 %  sodium chloride infusion   Intravenous Continuous Penland, Kelby Fam, MD        Allergies  Allergen Reactions  . Shellfish Allergy Hives and Shortness Of Breath  . Ace Inhibitors Cough  . Aminophylline Nausea And Vomiting    Heart races and pounds  . Epinephrine Other (See Comments)    Pallpitations during dental procedures  . Theophyllines Nausea And Vomiting and Other (See Comments)    Heart races and pounds  . Propoxyphene Hcl Nausea And Vomiting    Social History   Socioeconomic History  . Marital status: Widowed    Spouse name: Not on file  . Number of children: 0  . Years of education: Not on file  . Highest education level: Not on file  Occupational History  . Occupation: Database administrator at [employer]  . Financial resource strain: Not on file  . Food insecurity    Worry: Not on file    Inability: Not on file  .  Transportation needs    Medical: Not on file    Non-medical: Not on file  Tobacco Use  . Smoking status: Former Smoker    Packs/day: 1.00    Years: 20.00    Pack years: 20.00    Types: Cigarettes    Quit date: 09/25/1984    Years since quitting: 34.7  . Smokeless tobacco: Never Used  Substance and Sexual Activity  . Alcohol use: No    Alcohol/week: 0.0 standard drinks  . Drug use: No  . Sexual activity: Not Currently  Lifestyle  . Physical activity    Days per week: Not on file    Minutes per session: Not on file  . Stress: Not on file  Relationships  . Social Herbalist on phone: Not on file    Gets together: Not on file    Attends religious service: Not on file    Active member of club or organization: Not on file    Attends meetings of clubs or organizations: Not on file    Relationship status: Not on file  . Intimate partner violence    Fear of current or ex partner: Not on file    Emotionally abused: Not on file    Physically abused: Not on file    Forced sexual activity: Not on file  Other Topics Concern  . Not on file  Social History Narrative  . Not on file    Family History  Problem Relation Age of Onset  . Emphysema Father        smoker  . Heart disease Father   . Breast cancer Mother 53  . Breast cancer Sister 33  . Heart Problems Sister   . Heart disease Sister   . Breast cancer Maternal Aunt 48  . Lung cancer Maternal Uncle 11  smoker  . Colon cancer Paternal Uncle 56  . Diabetes Maternal Grandmother   . Heart Problems Maternal Grandfather   . Stroke Paternal Grandfather   . Lung cancer Maternal Uncle 72       smoker  . Heart Problems Paternal Uncle   . Diabetes Paternal Grandmother   . Rectal cancer Neg Hx   . Stomach cancer Neg Hx   . Esophageal cancer Neg Hx     ROS- All systems are reviewed and negative except as per the HPI above  Physical Exam: Vitals:   07/03/19 0904  BP: 134/80  Pulse: 97  Weight: 118.9 kg   Height: 5\' 6"  (1.676 m)   Wt Readings from Last 3 Encounters:  07/03/19 118.9 kg  05/21/19 93.9 kg  05/14/19 116 kg    Labs: Lab Results  Component Value Date   NA 137 05/20/2019   K 4.6 05/20/2019   CL 101 05/20/2019   CO2 27 05/20/2019   GLUCOSE 100 (H) 05/20/2019   BUN 26 (H) 05/20/2019   CREATININE 1.26 (H) 05/20/2019   CALCIUM 9.5 05/20/2019   PHOS 5.4 (H) 01/28/2015   MG 2.0 01/28/2015   Lab Results  Component Value Date   INR 1.18 01/25/2015   No results found for: CHOL, HDL, LDLCALC, TRIG   GEN- The patient is well appearing, alert and oriented x 3 today.   Head- normocephalic, atraumatic Eyes-  Sclera clear, conjunctiva pink Ears- hearing intact Oropharynx- clear Neck- supple, no JVP Lymph- no cervical lymphadenopathy Lungs- Clear to ausculation bilaterally, normal work of breathing Heart irrregular rate and rhythm, no murmurs, rubs or gallops, PMI not laterally displaced GI- soft, NT, ND, + BS Extremities- no clubbing, cyanosis, or edema MS- no significant deformity or atrophy Skin- no rash or lesion Psych- euthymic mood, full affect Neuro- strength and sensation are intact  EKG-atrial fib/ flutter at 97 bpm, qrs int 96 ms, qtc 454 ms Epic records reviewed Zio patch shows persistent afib, average 89 bpm    Assessment and Plan: 1. Afib  Tolerating ok S/p ablation 01/2012 In afib Continue flecainide 100 mg bid  Continue bisoprolol 10 mg qd Increase Cardizem to 240 mg daily Continue xarelto 20 mg qd, missed a dose 9/10 Now eligible for DCCV , will schedule, no further missed doses of anticoagulation Procedure explained to pt risk vrs benefit  bmet/mag  2. OSA Continue compliance with CPAP  3. Morbid obesity Weight loss encouraged, but not thru supplements  4. HTN Stable     F/u in afib clinic in 1 week after cardioversion   Will need EKG after cardioversion as I have increased flecainide to look at interval lengths   Butch Penny C.  [place], [facility] [address] [place], [place] [address] [phone]

## 2019-07-08 ENCOUNTER — Other Ambulatory Visit: Payer: Self-pay

## 2019-07-08 ENCOUNTER — Other Ambulatory Visit (HOSPITAL_COMMUNITY)
Admission: RE | Admit: 2019-07-08 | Discharge: 2019-07-08 | Disposition: A | Discharge status: Home / Self Care | Payer: Medicare Other | Source: Ambulatory Visit | Attending: Cardiology | Admitting: Cardiology

## 2019-07-09 ENCOUNTER — Other Ambulatory Visit: Payer: Self-pay

## 2019-07-09 ENCOUNTER — Other Ambulatory Visit (HOSPITAL_COMMUNITY)
Admission: RE | Admit: 2019-07-09 | Discharge: 2019-07-09 | Disposition: A | Discharge status: Home / Self Care | Payer: Medicare Other | Source: Ambulatory Visit | Attending: Cardiology | Admitting: Cardiology

## 2019-07-09 DIAGNOSIS — Z01812 Encounter for preprocedural laboratory examination: Secondary | ICD-10-CM | POA: Diagnosis present

## 2019-07-09 DIAGNOSIS — Z20828 Contact with and (suspected) exposure to other viral communicable diseases: Secondary | ICD-10-CM | POA: Insufficient documentation

## 2019-07-09 LAB — SARS CORONAVIRUS 2 (TAT 6-24 HRS): SARS Coronavirus 2: NEGATIVE

## 2019-07-11 ENCOUNTER — Ambulatory Visit (HOSPITAL_COMMUNITY): Admission: RE | Admit: 2019-07-11 | Payer: Medicare Other | Source: Home / Self Care | Admitting: Cardiology

## 2019-07-11 ENCOUNTER — Encounter (HOSPITAL_COMMUNITY): Admission: RE | Payer: Self-pay | Source: Home / Self Care

## 2019-07-11 SURGERY — CARDIOVERSION
Anesthesia: General

## 2019-07-16 ENCOUNTER — Other Ambulatory Visit (HOSPITAL_COMMUNITY): Payer: Self-pay | Admitting: *Deleted

## 2019-07-16 DIAGNOSIS — I4819 Other persistent atrial fibrillation: Secondary | ICD-10-CM

## 2019-07-17 ENCOUNTER — Other Ambulatory Visit (HOSPITAL_COMMUNITY)
Admission: RE | Admit: 2019-07-17 | Discharge: 2019-07-17 | Disposition: A | Discharge status: Home / Self Care | Payer: Medicare Other | Source: Ambulatory Visit | Attending: Nurse Practitioner | Admitting: Nurse Practitioner

## 2019-07-17 ENCOUNTER — Other Ambulatory Visit: Payer: Self-pay

## 2019-07-17 ENCOUNTER — Other Ambulatory Visit (HOSPITAL_COMMUNITY)
Admission: RE | Admit: 2019-07-17 | Discharge: 2019-07-17 | Disposition: A | Discharge status: Home / Self Care | Payer: Medicare Other | Source: Ambulatory Visit | Attending: Cardiovascular Disease | Admitting: Cardiovascular Disease

## 2019-07-17 DIAGNOSIS — Z20828 Contact with and (suspected) exposure to other viral communicable diseases: Secondary | ICD-10-CM | POA: Diagnosis not present

## 2019-07-17 DIAGNOSIS — Z01812 Encounter for preprocedural laboratory examination: Secondary | ICD-10-CM | POA: Insufficient documentation

## 2019-07-17 LAB — BASIC METABOLIC PANEL
Anion gap: 11 (ref 5–15)
BUN: 23 mg/dL (ref 8–23)
CO2: 26 mmol/L (ref 22–32)
Calcium: 9.5 mg/dL (ref 8.9–10.3)
Chloride: 102 mmol/L (ref 98–111)
Creatinine, Ser: 1.15 mg/dL — ABNORMAL HIGH (ref 0.44–1.00)
GFR calc Af Amer: 56 mL/min — ABNORMAL LOW (ref >60)
GFR calc non Af Amer: 48 mL/min — ABNORMAL LOW (ref >60)
Glucose, Bld: 110 mg/dL — ABNORMAL HIGH (ref 70–99)
Potassium: 4.9 mmol/L (ref 3.5–5.1)
Sodium: 139 mmol/L (ref 135–145)

## 2019-07-17 LAB — CBC
HCT: 32.9 % — ABNORMAL LOW (ref 36.0–46.0)
Hemoglobin: 10.1 g/dL — ABNORMAL LOW (ref 12.0–15.0)
MCH: 30.1 pg (ref 26.0–34.0)
MCHC: 30.7 g/dL (ref 30.0–36.0)
MCV: 97.9 fL (ref 80.0–100.0)
Platelets: 370 10*3/uL (ref 150–400)
RBC: 3.36 MIL/uL — ABNORMAL LOW (ref 3.87–5.11)
RDW: 15.3 % (ref 11.5–15.5)
WBC: 7.1 10*3/uL (ref 4.0–10.5)
nRBC: 0 % (ref 0.0–0.2)

## 2019-07-17 LAB — SARS CORONAVIRUS 2 (TAT 6-24 HRS): SARS Coronavirus 2: NEGATIVE

## 2019-07-18 ENCOUNTER — Ambulatory Visit (HOSPITAL_COMMUNITY): Payer: Medicare Other | Admitting: Nurse Practitioner

## 2019-07-21 NOTE — Addendum Note (Signed)
Encounter addended by: Sherran Needs, NP on: 07/21/2019 9:37 AM  Actions taken: Clinical Note Signed

## 2019-07-21 NOTE — Progress Notes (Addendum)
Called to confirm quarantine prior to procedure, patient has been at home, no symptoms. All questions answered.

## 2019-07-22 ENCOUNTER — Ambulatory Visit (HOSPITAL_COMMUNITY): Payer: Medicare Other | Admitting: Certified Registered Nurse Anesthetist

## 2019-07-22 ENCOUNTER — Ambulatory Visit (HOSPITAL_COMMUNITY)
Admission: RE | Admit: 2019-07-22 | Discharge: 2019-07-22 | Disposition: A | Discharge status: Home / Self Care | Payer: Medicare Other | Attending: Cardiovascular Disease | Admitting: Cardiovascular Disease

## 2019-07-22 ENCOUNTER — Encounter (HOSPITAL_COMMUNITY)
Admission: RE | Disposition: A | Discharge status: Home / Self Care | Payer: Self-pay | Source: Home / Self Care | Attending: Cardiovascular Disease

## 2019-07-22 ENCOUNTER — Encounter (HOSPITAL_COMMUNITY): Payer: Self-pay

## 2019-07-22 ENCOUNTER — Other Ambulatory Visit: Payer: Self-pay

## 2019-07-22 DIAGNOSIS — I4892 Unspecified atrial flutter: Secondary | ICD-10-CM | POA: Insufficient documentation

## 2019-07-22 DIAGNOSIS — I10 Essential (primary) hypertension: Secondary | ICD-10-CM | POA: Diagnosis not present

## 2019-07-22 DIAGNOSIS — M1990 Unspecified osteoarthritis, unspecified site: Secondary | ICD-10-CM | POA: Insufficient documentation

## 2019-07-22 DIAGNOSIS — Z8249 Family history of ischemic heart disease and other diseases of the circulatory system: Secondary | ICD-10-CM | POA: Diagnosis not present

## 2019-07-22 DIAGNOSIS — Z7951 Long term (current) use of inhaled steroids: Secondary | ICD-10-CM | POA: Diagnosis not present

## 2019-07-22 DIAGNOSIS — G4733 Obstructive sleep apnea (adult) (pediatric): Secondary | ICD-10-CM | POA: Insufficient documentation

## 2019-07-22 DIAGNOSIS — M797 Fibromyalgia: Secondary | ICD-10-CM | POA: Diagnosis not present

## 2019-07-22 DIAGNOSIS — Z888 Allergy status to other drugs, medicaments and biological substances status: Secondary | ICD-10-CM | POA: Diagnosis not present

## 2019-07-22 DIAGNOSIS — Z7984 Long term (current) use of oral hypoglycemic drugs: Secondary | ICD-10-CM | POA: Diagnosis not present

## 2019-07-22 DIAGNOSIS — Z87891 Personal history of nicotine dependence: Secondary | ICD-10-CM | POA: Diagnosis not present

## 2019-07-22 DIAGNOSIS — Z79899 Other long term (current) drug therapy: Secondary | ICD-10-CM | POA: Insufficient documentation

## 2019-07-22 DIAGNOSIS — I484 Atypical atrial flutter: Secondary | ICD-10-CM | POA: Diagnosis not present

## 2019-07-22 DIAGNOSIS — E785 Hyperlipidemia, unspecified: Secondary | ICD-10-CM | POA: Insufficient documentation

## 2019-07-22 DIAGNOSIS — J449 Chronic obstructive pulmonary disease, unspecified: Secondary | ICD-10-CM | POA: Insufficient documentation

## 2019-07-22 DIAGNOSIS — Z7901 Long term (current) use of anticoagulants: Secondary | ICD-10-CM | POA: Insufficient documentation

## 2019-07-22 DIAGNOSIS — N179 Acute kidney failure, unspecified: Secondary | ICD-10-CM | POA: Insufficient documentation

## 2019-07-22 DIAGNOSIS — I4819 Other persistent atrial fibrillation: Secondary | ICD-10-CM | POA: Diagnosis present

## 2019-07-22 DIAGNOSIS — Z6841 Body Mass Index (BMI) 40.0 and over, adult: Secondary | ICD-10-CM | POA: Diagnosis not present

## 2019-07-22 HISTORY — PX: CARDIOVERSION: SHX1299

## 2019-07-22 LAB — GLUCOSE, CAPILLARY: Glucose-Capillary: 103 mg/dL — ABNORMAL HIGH (ref 70–99)

## 2019-07-22 SURGERY — CARDIOVERSION
Anesthesia: Monitor Anesthesia Care

## 2019-07-22 MED ORDER — PROPOFOL 10 MG/ML IV BOLUS
INTRAVENOUS | Status: DC | PRN
  Administered 2019-07-22: 40 mg via INTRAVENOUS
  Administered 2019-07-22: 90 mg via INTRAVENOUS

## 2019-07-22 MED ORDER — LIDOCAINE 2% (20 MG/ML) 5 ML SYRINGE
INTRAMUSCULAR | Status: DC | PRN
  Administered 2019-07-22: 40 mg via INTRAVENOUS

## 2019-07-22 MED ORDER — SODIUM CHLORIDE 0.9 % IV SOLN
INTRAVENOUS | Status: DC
  Administered 2019-07-22: 13:00:00 via INTRAVENOUS

## 2019-07-22 NOTE — Anesthesia Preprocedure Evaluation (Signed)
Anesthesia Evaluation  Patient identified by MRN, date of birth, ID band Patient awake    Reviewed: Allergy & Precautions, NPO status , Patient's Chart, lab work & pertinent test results  Airway Mallampati: III  TM Distance: >3 FB Neck ROM: Full    Dental  (+) Teeth Intact   Pulmonary former smoker,    breath sounds clear to auscultation       Cardiovascular hypertension,  Rhythm:Irregular Rate:Normal     Neuro/Psych    GI/Hepatic   Endo/Other    Renal/GU      Musculoskeletal   Abdominal (+) + obese,   Peds  Hematology   Anesthesia Other Findings   Reproductive/Obstetrics                             Anesthesia Physical Anesthesia Plan  ASA: III  Anesthesia Plan: General   Post-op Pain Management:    Induction: Intravenous  PONV Risk Score and Plan:   Airway Management Planned: Mask  Additional Equipment:   Intra-op Plan:   Post-operative Plan:   Informed Consent: I have reviewed the patients History and Physical, chart, labs and discussed the procedure including the risks, benefits and alternatives for the proposed anesthesia with the patient or authorized representative who has indicated his/her understanding and acceptance.       Plan Discussed with: CRNA and Anesthesiologist  Anesthesia Plan Comments:         Anesthesia Quick Evaluation

## 2019-07-22 NOTE — Anesthesia Procedure Notes (Signed)
Procedure Name: General with mask airway Date/Time: 07/22/2019 1:40 PM Performed by: Colin Benton, CRNA Pre-anesthesia Checklist: Patient identified, Emergency Drugs available, Suction available and Patient being monitored Patient Re-evaluated:Patient Re-evaluated prior to induction Oxygen Delivery Method: Ambu bag Preoxygenation: Pre-oxygenation with 100% oxygen Induction Type: IV induction Ventilation: Mask ventilation without difficulty Dental Injury: Teeth and Oropharynx as per pre-operative assessment

## 2019-07-22 NOTE — Interval H&P Note (Signed)
History and Physical Interval Note:  07/22/2019 12:43 PM  Margretta Ditty  has presented today for surgery, with the diagnosis of AFIB.  The various methods of treatment have been discussed with the patient and family. After consideration of risks, benefits and other options for treatment, the patient has consented to  Procedure(s): CARDIOVERSION (N/A) as a surgical intervention.  The patient's history has been reviewed, patient examined, no change in status, stable for surgery.  I have reviewed the patient's chart and labs.  Questions were answered to the patient's satisfaction.     Jenkins Rouge

## 2019-07-22 NOTE — Transfer of Care (Signed)
Immediate Anesthesia Transfer of Care Note  Patient: [name]  Procedure(s) Performed: CARDIOVERSION (N/A )  Patient Location: Endoscopy Unit  Anesthesia Type:General  Level of Consciousness: drowsy and patient cooperative  Airway & Oxygen Therapy: Patient Spontanous Breathing  Post-op Assessment: Report given to RN and Post -op Vital signs reviewed and stable  Post vital signs: Reviewed and stable  Last Vitals:  Vitals Value Taken Time  BP    Temp    Pulse    Resp    SpO2      Last Pain:  Vitals:   07/22/19 1251  TempSrc: Temporal  PainSc: 0-No pain         Complications: No apparent anesthesia complications

## 2019-07-22 NOTE — CV Procedure (Signed)
[place]: Anesthesia:  Propofol On Xarelto with no missed doses  DCC x 5 120, 150 then 200 J x 3 Finally converted to NSR at rate of 72 bpm  Will check ECG since flecainide dose recently increased No immediate neurologic sequelae  Jenkins Rouge

## 2019-07-22 NOTE — Discharge Instructions (Signed)

## 2019-07-22 NOTE — Anesthesia Postprocedure Evaluation (Signed)
Anesthesia Post Note  Patient: [name]  Procedure(s) Performed: CARDIOVERSION (N/A )     Patient location during evaluation: Endoscopy Anesthesia Type: General Level of consciousness: awake and alert Pain management: pain level controlled Vital Signs Assessment: post-procedure vital signs reviewed and stable Respiratory status: spontaneous breathing, nonlabored ventilation, respiratory function stable and patient connected to nasal cannula oxygen Cardiovascular status: blood pressure returned to baseline and stable Postop Assessment: no apparent nausea or vomiting Anesthetic complications: no    Last Vitals:  Vitals:   07/22/19 1405 07/22/19 1410  BP:  (!) 102/51  Pulse: 72 70  Resp: 20 16  Temp:    SpO2: 96% 97%    Last Pain:  Vitals:   07/22/19 1410  TempSrc:   PainSc: 0-No pain                 Rayane Gallardo COKER

## 2019-07-23 ENCOUNTER — Encounter (HOSPITAL_COMMUNITY): Payer: Self-pay | Admitting: Cardiovascular Disease

## 2019-08-01 ENCOUNTER — Encounter (HOSPITAL_COMMUNITY): Payer: Self-pay | Admitting: Nurse Practitioner

## 2019-08-01 ENCOUNTER — Other Ambulatory Visit: Payer: Self-pay

## 2019-08-01 ENCOUNTER — Ambulatory Visit (HOSPITAL_BASED_OUTPATIENT_CLINIC_OR_DEPARTMENT_OTHER)
Admission: RE | Admit: 2019-08-01 | Discharge: 2019-08-01 | Disposition: A | Discharge status: Home / Self Care | Payer: Medicare Other | Source: Ambulatory Visit | Attending: Nurse Practitioner | Admitting: Nurse Practitioner

## 2019-08-01 ENCOUNTER — Ambulatory Visit (HOSPITAL_COMMUNITY)
Admission: RE | Admit: 2019-08-01 | Discharge: 2019-08-01 | Disposition: A | Discharge status: Home / Self Care | Payer: Medicare Other | Source: Ambulatory Visit | Attending: Nurse Practitioner | Admitting: Nurse Practitioner

## 2019-08-01 VITALS — BP 160/100 | HR 75 | Ht 66.0 in | Wt 244.2 lb

## 2019-08-01 DIAGNOSIS — Z801 Family history of malignant neoplasm of trachea, bronchus and lung: Secondary | ICD-10-CM | POA: Insufficient documentation

## 2019-08-01 DIAGNOSIS — M797 Fibromyalgia: Secondary | ICD-10-CM | POA: Diagnosis not present

## 2019-08-01 DIAGNOSIS — F419 Anxiety disorder, unspecified: Secondary | ICD-10-CM | POA: Diagnosis not present

## 2019-08-01 DIAGNOSIS — J449 Chronic obstructive pulmonary disease, unspecified: Secondary | ICD-10-CM | POA: Insufficient documentation

## 2019-08-01 DIAGNOSIS — Z79899 Other long term (current) drug therapy: Secondary | ICD-10-CM | POA: Insufficient documentation

## 2019-08-01 DIAGNOSIS — Z803 Family history of malignant neoplasm of breast: Secondary | ICD-10-CM | POA: Insufficient documentation

## 2019-08-01 DIAGNOSIS — I10 Essential (primary) hypertension: Secondary | ICD-10-CM | POA: Diagnosis not present

## 2019-08-01 DIAGNOSIS — Z96659 Presence of unspecified artificial knee joint: Secondary | ICD-10-CM | POA: Insufficient documentation

## 2019-08-01 DIAGNOSIS — Z7901 Long term (current) use of anticoagulants: Secondary | ICD-10-CM | POA: Insufficient documentation

## 2019-08-01 DIAGNOSIS — Z888 Allergy status to other drugs, medicaments and biological substances status: Secondary | ICD-10-CM | POA: Insufficient documentation

## 2019-08-01 DIAGNOSIS — Z800 Family history of malignant neoplasm of digestive organs: Secondary | ICD-10-CM | POA: Insufficient documentation

## 2019-08-01 DIAGNOSIS — Z853 Personal history of malignant neoplasm of breast: Secondary | ICD-10-CM | POA: Insufficient documentation

## 2019-08-01 DIAGNOSIS — Z886 Allergy status to analgesic agent status: Secondary | ICD-10-CM | POA: Insufficient documentation

## 2019-08-01 DIAGNOSIS — Z6839 Body mass index (BMI) 39.0-39.9, adult: Secondary | ICD-10-CM | POA: Diagnosis not present

## 2019-08-01 DIAGNOSIS — K219 Gastro-esophageal reflux disease without esophagitis: Secondary | ICD-10-CM | POA: Insufficient documentation

## 2019-08-01 DIAGNOSIS — E785 Hyperlipidemia, unspecified: Secondary | ICD-10-CM | POA: Diagnosis not present

## 2019-08-01 DIAGNOSIS — G4733 Obstructive sleep apnea (adult) (pediatric): Secondary | ICD-10-CM | POA: Insufficient documentation

## 2019-08-01 DIAGNOSIS — Z7984 Long term (current) use of oral hypoglycemic drugs: Secondary | ICD-10-CM | POA: Diagnosis not present

## 2019-08-01 DIAGNOSIS — Z8249 Family history of ischemic heart disease and other diseases of the circulatory system: Secondary | ICD-10-CM | POA: Diagnosis not present

## 2019-08-01 DIAGNOSIS — Z91013 Allergy to seafood: Secondary | ICD-10-CM | POA: Diagnosis not present

## 2019-08-01 DIAGNOSIS — Z836 Family history of other diseases of the respiratory system: Secondary | ICD-10-CM | POA: Insufficient documentation

## 2019-08-01 DIAGNOSIS — I4819 Other persistent atrial fibrillation: Secondary | ICD-10-CM

## 2019-08-01 DIAGNOSIS — I4892 Unspecified atrial flutter: Secondary | ICD-10-CM | POA: Diagnosis not present

## 2019-08-01 DIAGNOSIS — Z87891 Personal history of nicotine dependence: Secondary | ICD-10-CM | POA: Diagnosis not present

## 2019-08-01 DIAGNOSIS — Z79811 Long term (current) use of aromatase inhibitors: Secondary | ICD-10-CM | POA: Diagnosis not present

## 2019-08-01 DIAGNOSIS — Z833 Family history of diabetes mellitus: Secondary | ICD-10-CM | POA: Insufficient documentation

## 2019-08-01 LAB — ECHOCARDIOGRAM COMPLETE
Height: 66 in
Weight: 3907.2 oz

## 2019-08-01 MED ORDER — FLECAINIDE ACETATE 100 MG PO TABS: 50.0000 mg | ORAL_TABLET | Freq: Two times a day (BID) | ORAL | 6 refills | Status: DC

## 2019-08-01 NOTE — Progress Notes (Signed)
  Echocardiogram 2D Echocardiogram has been performed.  Darlina Sicilian M 08/01/2019, 11:29 AM

## 2019-08-01 NOTE — Patient Instructions (Signed)
Decrease flecainide 50mg  twice a day
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
## 2019-08-02 ENCOUNTER — Other Ambulatory Visit (HOSPITAL_COMMUNITY): Payer: Self-pay | Admitting: Nurse Practitioner

## 2019-08-04 ENCOUNTER — Telehealth: Payer: Self-pay

## 2019-08-04 NOTE — Telephone Encounter (Signed)
Called pt to confirm virtual appt on 08/08/19. I was not able to reach pt due to her mailbox being full.

## 2019-08-07 ENCOUNTER — Encounter: Payer: Self-pay | Admitting: Gastroenterology

## 2019-08-08 ENCOUNTER — Other Ambulatory Visit: Payer: Self-pay

## 2019-08-08 ENCOUNTER — Telehealth (INDEPENDENT_AMBULATORY_CARE_PROVIDER_SITE_OTHER): Payer: Medicare Other | Admitting: Internal Medicine

## 2019-08-08 ENCOUNTER — Encounter: Payer: Self-pay | Admitting: Internal Medicine

## 2019-08-08 ENCOUNTER — Telehealth: Payer: Self-pay

## 2019-08-08 DIAGNOSIS — I4819 Other persistent atrial fibrillation: Secondary | ICD-10-CM

## 2019-08-08 DIAGNOSIS — G4733 Obstructive sleep apnea (adult) (pediatric): Secondary | ICD-10-CM

## 2019-08-08 NOTE — Telephone Encounter (Signed)
-----   Message from Thompson Grayer, MD sent at 08/08/2019  8:52 AM EST ----- Afib ablation  [place]/ ice/ anesthesia  Cardiac CT

## 2019-08-08 NOTE — Telephone Encounter (Signed)
Pt scheduled for afib ablation on September 23, 2019 at 7:30 am.  Pt has oncology appt December 1 and Pt states she gets lab work at that visit.  Left message in appt notes requesting CBC and BMP.  Pt scheduled for covid test.  Need to clarify for cardiac CT instruction if Pt should hold bisoprolol and take metoprolol instead.    Will clarify and complete cardiac CT instructions.  Need to mail both letters.

## 2019-08-08 NOTE — Progress Notes (Signed)
Electrophysiology TeleHealth Note  Due to national recommendations of social distancing due to [name] 19, an audio telehealth visit is felt to be most appropriate for this patient at this time.  Verbal consent was obtained by me for the telehealth visit today.  The patient does not have capability for a virtual visit.  A phone visit is therefore required today.   Date:  08/08/2019   ID:  [name], DOB 1951/05/19, MRN [id number]  Location: patient's home  Provider location:  [facility]  Evaluation Performed: Follow-up visit  PCP:  [name], MD   Electrophysiologist:  Dr Rayann Heman  Chief Complaint:  palpitations  History of Present Illness:    [name] is a 68 y.o. female who presents via telehealth conferencing today.  Since last being seen in our clinic, the patient reports doing reasonably well.  She underwent cardioversion of her afib but has already returned to afib.  + palpitations and fatigue.  Today, she denies symptoms of chest pain, shortness of breath,  lower extremity edema, dizziness, presyncope, or syncope.  The patient is otherwise without complaint today.  The patient denies symptoms of fevers, chills, cough, or new SOB worrisome for COVID 19.  Past Medical History:  Diagnosis Date  . Acute renal failure ([place]) 01/27/2015  . Acute respiratory failure ([place]) 01/27/2015  . Adenomatous colon polyp 1993  . Allergy   . Anemia   . Anxiety   . Anxiety disorder   . Asthma    Spirometry 1989: FEV1 2.12 (71%) with ratio 82.  HFA 90% after coaching 11-09-10  . Atrial flutter ([place]) chadsvasc of 3 (11/19/14)   Status post cardioversion 2-12 2013. s/p RFCA 5/13 with JAllred (coumadin and amio stopped)   . Breast cancer ([place])    left  . COPD (chronic obstructive pulmonary disease) ([place])   . DDD (degenerative disc disease)   . Diverticulosis   . Essential hypertension, benign   . Fibromyalgia   . GERD (gastroesophageal reflux disease)    EGD  with HH / esophagitis 06-30-93  . Hiatal hernia   . History of atrial fibrillation   . Hyperlipidemia   . Internal and external hemorrhoids without complication   . Lipoma of colon   . Migraine   . Osteoarthritis   . Pelvic fracture ([place])   . Personal history of colonic polyps 06/14/2010  . Pneumonia   . PONV (postoperative nausea and vomiting)   . Radiation    left breast cancer  . Sleep apnea    cpap    Past Surgical History:  Procedure Laterality Date  . A FLUTTER ABLATION  02/22/2012  . ATRIAL FLUTTER ABLATION N/A 02/22/2012   Procedure: ATRIAL FLUTTER ABLATION;  Surgeon: Thompson Grayer, MD;  Location: [facility] CATH LAB;  Service: Cardiovascular;  Laterality: N/A;  . BREAST BIOPSY Left 11/2014  . CARDIOVERSION N/A 02/19/2014   Procedure: CARDIOVERSION;  Surgeon: Dorothy Spark, MD;  Location: [facility];  Service: Cardiovascular;  Laterality: N/A;  . CARDIOVERSION N/A 07/22/2019   Procedure: CARDIOVERSION;  Surgeon: Josue Hector, MD;  Location: [facility] ENDOSCOPY;  Service: Cardiovascular;  Laterality: N/A;  . COLONOSCOPY  05/2010, 07/2000  . DILATION AND CURETTAGE OF UTERUS    . ESOPHAGOGASTRODUODENOSCOPY  05/2010, 06/1993  . JOINT REPLACEMENT    . POLYPECTOMY    . RADIOACTIVE SEED GUIDED PARTIAL MASTECTOMY WITH AXILLARY SENTINEL LYMPH NODE BIOPSY Left 01/26/2015   Procedure: LEFT  PARTIAL MASTECTOMY WITH  RADIOACTIVE SEED LOCALIZATION, LEFT  AXILLARY SENTINEL LYMPH NODE BIOPSY;  Surgeon: Fanny Skates, MD;  Location: [facility];  Service: General;  Laterality: Left;  . ROTATOR CUFF REPAIR     bilateral  . ROTATOR CUFF REPAIR     bil  . TOTAL KNEE ARTHROPLASTY     x2 09/2006  . UPPER GASTROINTESTINAL ENDOSCOPY      Current Outpatient Medications  Medication Sig Dispense Refill  . albuterol (PROVENTIL) (2.5 MG/3ML) 0.083% nebulizer solution Take 3 mLs (2.5 mg total) by nebulization every 4 (four) hours as needed for wheezing or shortness of breath (dx: J45.991). 100 vial 5  . ALPRAZolam (XANAX)  0.5 MG tablet Take 0.5 mg by mouth 2 (two) times daily as needed for anxiety.   0  . anastrozole (ARIMIDEX) 1 MG tablet TAKE 1 TABLET BY MOUTH EVERY DAY(NEED APPOINTMENT FOR MORE REFILLS) (Patient taking differently: Take 1 mg by mouth daily. ) 90 tablet 3  . atorvastatin (LIPITOR) 10 MG tablet Take 10 mg by mouth daily.  0  . bisoprolol (ZEBETA) 10 MG tablet Take 1 tablet (10 mg total) by mouth daily. 30 tablet 2  . calcium-vitamin D (OSCAL WITH D) 500-200 MG-UNIT per tablet Take 1 tablet by mouth daily with breakfast. 30 tablet 3  . celecoxib (CELEBREX) 200 MG capsule Take 200 mg by mouth 3 (three) times a week.   0  . Cholecalciferol (VITAMIN D3) 10 MCG (400 UNIT) CAPS Take 400 Units by mouth daily.    . cyclobenzaprine (FLEXERIL) 10 MG tablet Take 10 mg by mouth 3 (three) times daily as needed for muscle spasms.    Marland Kitchen diltiazem (CARDIZEM CD) 180 MG 24 hr capsule Take 180 mg by mouth daily.    Marland Kitchen diltiazem (CARTIA XT) 240 MG 24 hr capsule Take 1 capsule (240 mg total) by mouth daily. 30 capsule 11  . flecainide (TAMBOCOR) 50 MG tablet Take 1 tablet (50 mg total) by mouth 2 (two) times daily. 180 tablet 0  . fluticasone (FLONASE) 50 MCG/ACT nasal spray Place 2 sprays into both nostrils daily as needed for allergies or rhinitis.    . furosemide (LASIX) 40 MG tablet Take 40 mg by mouth 3 (three) times a week.   0  . GARCINIA CAMBOGIA-CHROMIUM PO Take 1 tablet by mouth daily.    . hydrOXYzine (ATARAX/VISTARIL) 25 MG tablet Take 25 mg by mouth every 6 (six) hours as needed for itching.   0  . losartan (COZAAR) 100 MG tablet Take 100 mg by mouth daily.   1  . metFORMIN (GLUCOPHAGE) 500 MG tablet Take 500 mg by mouth 2 (two) times daily with a meal.     . nystatin (NYAMYC) powder Apply topically 2 (two) times daily as needed. 60 g 2  . omeprazole (PRILOSEC) 40 MG capsule take 1 capsule by mouth once daily BEFORE BREAKFAST (Patient taking differently: Take 40 mg by mouth daily before breakfast. ) 90  capsule 0  . PARoxetine (PAXIL) 20 MG tablet Take 20 mg by mouth daily.    . potassium chloride (K-DUR) 10 MEQ tablet Take 10 mEq by mouth daily.   0  . PROAIR RESPICLICK 123XX123 (90 Base) MCG/ACT AEPB INHALE 2 PUFFS BY MOUTH EVERY 4 HOURS AS NEEDED (Patient taking differently: Inhale 2 puffs into the lungs every 4 (four) hours as needed (shortness of breath or wheezing). ) 1 each 2  . promethazine (PHENERGAN) 25 MG tablet Take 25 mg by mouth every 4 (four) hours as needed for nausea or vomiting.     Marland Kitchen  SYMBICORT 160-4.5 MCG/ACT inhaler Inhale 2 puffs into the lungs 2 (two) times daily. NEEDS APPT FOR MORE REFILLS 1 Inhaler 1  . temazepam (RESTORIL) 15 MG capsule Take 1 capsule (15 mg total) by mouth at bedtime as needed for sleep. 30 capsule 0  . temazepam (RESTORIL) 30 MG capsule Take 30 mg by mouth at bedtime as needed for sleep.     . vitamin E 400 UNIT capsule Take 400 Units by mouth daily.    Alveda Reasons 20 MG TABS tablet TAKE 1 TABLET BY MOUTH EVERY DAY WITH SUPPER (Patient taking differently: Take 20 mg by mouth daily with supper. ) 30 tablet 6   No current facility-administered medications for this visit.    Facility-Administered Medications Ordered in Other Visits  Medication Dose Route Frequency Provider Last Rate Last Dose  . 0.9 %  sodium chloride infusion   Intravenous Continuous Penland, Kelby Fam, MD        Allergies:   Shellfish allergy, Ace inhibitors, Aminophylline, Epinephrine, Theophyllines, and Propoxyphene hcl   Social History:  The patient  reports that she quit smoking about 34 years ago. Her smoking use included cigarettes. She has a 20.00 pack-year smoking history. She has never used smokeless tobacco. She reports that she does not drink alcohol or use drugs.   Family History:  The patient's  family history includes Breast cancer (age of onset: 53) in her maternal aunt and mother; Breast cancer (age of onset: 45) in her sister; Colon cancer (age of onset: 84) in her paternal  uncle; Diabetes in her maternal grandmother and paternal grandmother; Emphysema in her father; Heart Problems in her maternal grandfather, paternal uncle, and sister; Heart disease in her father and sister; Lung cancer (age of onset: 65) in her maternal uncle; Lung cancer (age of onset: 2) in her maternal uncle; Stroke in her paternal grandfather.   ROS:  Please see the history of present illness.   All other systems are personally reviewed and negative.    Exam:    Vital Signs:  There were no vitals taken for this visit.  Well sounding, alert and conversany  Labs/Other Tests and Data Reviewed:    Recent Labs: 05/20/2019: ALT 14 07/17/2019: BUN 23; Creatinine, Ser 1.15; Hemoglobin 10.1; Platelets 370; Potassium 4.9; Sodium 139   Wt Readings from Last 3 Encounters:  08/01/19 244 lb 3.2 oz (110.8 kg)  07/22/19 260 lb (117.9 kg)  07/03/19 262 lb 3.2 oz (118.9 kg)     Echo reviewed with patient  ASSESSMENT & PLAN:    1.  Atrial fibrillation The patient has symptomatic, recurrent persistent atrial fibrillation. She is s/p prior CTI ablation by me in 2013 she has failed medical therapy with flecainide. Chads2vasc score is 4.  she is anticoagulated with xarelto . Therapeutic strategies for afib including medicine and ablation were discussed in detail with the patient today. Risk, benefits, and alternatives to EP study and radiofrequency ablation for afib were also discussed in detail today. These risks include but are not limited to stroke, bleeding, vascular damage, tamponade, perforation, damage to the esophagus, lungs, and other structures, pulmonary vein stenosis, worsening renal function, and death. The patient understands these risk and wishes to proceed.  We will therefore proceed with catheter ablation at the next available time.  Carto, ICE, anesthesia are requested for the procedure.  Will also obtain cardiac CT prior to the procedure to exclude LAA thrombus and further evaluate  atrial anatomy.  2. OSA She uses CPAP  3. Obesity Lifestyle modification is encouraged  4. HTN Stable No change required today    Patient Risk:  after full review of this patients clinical status, I feel that they are at high risk at this time.  Today, I have spent 25 minutes with the patient with telehealth technology discussing arrhythmia management .    Army Fossa, MD  08/08/2019 8:52 AM     [facility] [address] [place]  [address] [phone] (office) [phone] (fax)

## 2019-08-14 NOTE — Telephone Encounter (Signed)
Outreach made for advisement on Pt pre meds. No response received.  Will advise Pt to take bisoprolol 2 hours prior to cardiac CT   Letters mailed.  Work up complete.

## 2019-08-19 ENCOUNTER — Other Ambulatory Visit: Payer: Self-pay

## 2019-08-19 ENCOUNTER — Inpatient Hospital Stay (HOSPITAL_COMMUNITY): Payer: Medicare Other | Attending: Hematology

## 2019-08-19 DIAGNOSIS — M8580 Other specified disorders of bone density and structure, unspecified site: Secondary | ICD-10-CM | POA: Diagnosis not present

## 2019-08-19 DIAGNOSIS — C50212 Malignant neoplasm of upper-inner quadrant of left female breast: Secondary | ICD-10-CM | POA: Insufficient documentation

## 2019-08-19 DIAGNOSIS — Z87891 Personal history of nicotine dependence: Secondary | ICD-10-CM | POA: Diagnosis not present

## 2019-08-19 DIAGNOSIS — E538 Deficiency of other specified B group vitamins: Secondary | ICD-10-CM | POA: Insufficient documentation

## 2019-08-19 DIAGNOSIS — D649 Anemia, unspecified: Secondary | ICD-10-CM | POA: Diagnosis not present

## 2019-08-19 DIAGNOSIS — Z170 Estrogen receptor positive status [ER+]: Secondary | ICD-10-CM | POA: Diagnosis not present

## 2019-08-19 DIAGNOSIS — Z923 Personal history of irradiation: Secondary | ICD-10-CM | POA: Diagnosis not present

## 2019-08-19 DIAGNOSIS — Z79811 Long term (current) use of aromatase inhibitors: Secondary | ICD-10-CM | POA: Diagnosis not present

## 2019-08-19 LAB — CBC WITH DIFFERENTIAL/PLATELET
Abs Immature Granulocytes: 0.02 10*3/uL (ref 0.00–0.07)
Basophils Absolute: 0.1 10*3/uL (ref 0.0–0.1)
Basophils Relative: 1 %
Eosinophils Absolute: 0.3 10*3/uL (ref 0.0–0.5)
Eosinophils Relative: 4 %
HCT: 31.8 % — ABNORMAL LOW (ref 36.0–46.0)
Hemoglobin: 9.6 g/dL — ABNORMAL LOW (ref 12.0–15.0)
Immature Granulocytes: 0 %
Lymphocytes Relative: 10 %
Lymphs Abs: 0.8 10*3/uL (ref 0.7–4.0)
MCH: 29.4 pg (ref 26.0–34.0)
MCHC: 30.2 g/dL (ref 30.0–36.0)
MCV: 97.2 fL (ref 80.0–100.0)
Monocytes Absolute: 0.6 10*3/uL (ref 0.1–1.0)
Monocytes Relative: 7 %
Neutro Abs: 5.9 10*3/uL (ref 1.7–7.7)
Neutrophils Relative %: 78 %
Platelets: 368 10*3/uL (ref 150–400)
RBC: 3.27 MIL/uL — ABNORMAL LOW (ref 3.87–5.11)
RDW: 14.6 % (ref 11.5–15.5)
WBC: 7.7 10*3/uL (ref 4.0–10.5)
nRBC: 0 % (ref 0.0–0.2)

## 2019-08-19 LAB — VITAMIN D 25 HYDROXY (VIT D DEFICIENCY, FRACTURES): Vit D, 25-Hydroxy: 38.69 ng/mL (ref 30–100)

## 2019-08-19 LAB — COMPREHENSIVE METABOLIC PANEL
ALT: 13 U/L (ref 0–44)
AST: 11 U/L — ABNORMAL LOW (ref 15–41)
Albumin: 3.6 g/dL (ref 3.5–5.0)
Alkaline Phosphatase: 58 U/L (ref 38–126)
Anion gap: 7 (ref 5–15)
BUN: 15 mg/dL (ref 8–23)
CO2: 25 mmol/L (ref 22–32)
Calcium: 9 mg/dL (ref 8.9–10.3)
Chloride: 106 mmol/L (ref 98–111)
Creatinine, Ser: 1.03 mg/dL — ABNORMAL HIGH (ref 0.44–1.00)
GFR calc Af Amer: >60 mL/min (ref >60)
GFR calc non Af Amer: 55 mL/min — ABNORMAL LOW (ref >60)
Glucose, Bld: 104 mg/dL — ABNORMAL HIGH (ref 70–99)
Potassium: 4.4 mmol/L (ref 3.5–5.1)
Sodium: 138 mmol/L (ref 135–145)
Total Bilirubin: 0.7 mg/dL (ref 0.3–1.2)
Total Protein: 7.5 g/dL (ref 6.5–8.1)

## 2019-08-19 LAB — LACTATE DEHYDROGENASE: LDH: 121 U/L (ref 98–192)

## 2019-08-19 LAB — VITAMIN B12: Vitamin B-12: 3315 pg/mL — ABNORMAL HIGH (ref 180–914)

## 2019-08-19 LAB — IRON AND TIBC
Iron: 39 ug/dL (ref 28–170)
Saturation Ratios: 12 % (ref 10.4–31.8)
TIBC: 328 ug/dL (ref 250–450)
UIBC: 289 ug/dL

## 2019-08-19 LAB — FERRITIN: Ferritin: 165 ng/mL (ref 11–307)

## 2019-08-20 ENCOUNTER — Ambulatory Visit: Payer: Medicare Other | Admitting: Gastroenterology

## 2019-08-26 ENCOUNTER — Ambulatory Visit (HOSPITAL_COMMUNITY): Payer: Medicare Other | Admitting: Nurse Practitioner

## 2019-08-29 ENCOUNTER — Telehealth: Payer: Self-pay | Admitting: Internal Medicine

## 2019-08-29 NOTE — Telephone Encounter (Signed)
New Message  Pt called and stated that she would like to take the covid test at [facility] instead of [facility] due to being closer to home  Please call to discuss

## 2019-09-01 ENCOUNTER — Other Ambulatory Visit (HOSPITAL_COMMUNITY): Payer: Self-pay | Admitting: Nurse Practitioner

## 2019-09-01 DIAGNOSIS — I480 Paroxysmal atrial fibrillation: Secondary | ICD-10-CM

## 2019-09-02 ENCOUNTER — Ambulatory Visit (HOSPITAL_COMMUNITY): Payer: Medicare Other | Admitting: Nurse Practitioner

## 2019-09-02 NOTE — Telephone Encounter (Signed)
Cannot change to [facility] because they do not provide covid screens on Saturday, and Friday is Christmas and all sites are closed.

## 2019-09-08 ENCOUNTER — Telehealth: Payer: Self-pay | Admitting: Internal Medicine

## 2019-09-08 NOTE — Telephone Encounter (Signed)
New message  Patient states that she is unable to have ablation done on 09/23/19 due to her having to be quarantined.  Patient would like to reschedule ablation for mid January. Please give patient a call back to assist.

## 2019-09-08 NOTE — Telephone Encounter (Signed)
Returned call to Pt.  Pt has covid  Advised would call her in January to reschedule

## 2019-09-10 ENCOUNTER — Ambulatory Visit: Payer: Medicare Other | Admitting: Gastroenterology

## 2019-09-11 ENCOUNTER — Other Ambulatory Visit: Payer: Self-pay | Admitting: Pulmonary Disease

## 2019-09-11 MED ORDER — SYMBICORT 160-4.5 MCG/ACT IN AERO: 2.0000 | INHALATION_SPRAY | Freq: Two times a day (BID) | RESPIRATORY_TRACT | 0 refills | Status: DC

## 2019-09-17 ENCOUNTER — Other Ambulatory Visit (HOSPITAL_COMMUNITY): Payer: Medicare Other

## 2019-09-20 ENCOUNTER — Other Ambulatory Visit (HOSPITAL_COMMUNITY): Payer: Medicare Other

## 2019-09-23 ENCOUNTER — Ambulatory Visit (HOSPITAL_COMMUNITY): Admit: 2019-09-23 | Payer: Medicare Other | Admitting: Internal Medicine

## 2019-09-23 ENCOUNTER — Encounter (HOSPITAL_COMMUNITY): Payer: Medicare Other

## 2019-09-23 SURGERY — ATRIAL FIBRILLATION ABLATION
Anesthesia: General

## 2019-10-01 ENCOUNTER — Telehealth: Payer: Self-pay

## 2019-10-01 ENCOUNTER — Other Ambulatory Visit: Payer: Self-pay | Admitting: Pulmonary Disease

## 2019-10-01 NOTE — Telephone Encounter (Signed)
Left message for Pt.  She had cancelled her afib ablation when she got sick with coronavirus.  Called just to check in with Pt.  Will put in recall to rediscuss ablation in a couple of months with JA.

## 2019-10-16 ENCOUNTER — Telehealth: Payer: Self-pay | Admitting: Internal Medicine

## 2019-10-16 DIAGNOSIS — I4891 Unspecified atrial fibrillation: Secondary | ICD-10-CM

## 2019-10-16 NOTE — Telephone Encounter (Signed)
  Patient would like to get her ablation scheduled.

## 2019-10-17 ENCOUNTER — Telehealth: Payer: Self-pay | Admitting: Internal Medicine

## 2019-10-17 NOTE — Telephone Encounter (Signed)
New Message    Pt is calling and says she is needing to reschedule her ablation appt    Please call back

## 2019-10-17 NOTE — Telephone Encounter (Signed)
Returned call to Pt.  Pt is now ready to reschedule her ablation.    Pt had previously thought she did have covid, but per Pt she tested negative.  Pt scheduled for November 25, 2019 at 7:30 am  Will get labs/covid test on February 26 at [facility]  Will need cardiac CT prior  Updated instruction letters and will mail per Pt request.

## 2019-10-21 ENCOUNTER — Ambulatory Visit (HOSPITAL_COMMUNITY): Payer: Medicare Other | Admitting: Nurse Practitioner

## 2019-10-21 NOTE — Telephone Encounter (Signed)
Instructions mailed.

## 2019-10-26 ENCOUNTER — Other Ambulatory Visit (HOSPITAL_COMMUNITY): Payer: Self-pay | Admitting: Nurse Practitioner

## 2019-11-19 ENCOUNTER — Other Ambulatory Visit: Payer: Self-pay | Admitting: Pulmonary Disease

## 2019-11-19 ENCOUNTER — Telehealth (HOSPITAL_COMMUNITY): Payer: Self-pay | Admitting: Emergency Medicine

## 2019-11-19 NOTE — Telephone Encounter (Signed)
Unable to leave new VM as her VM box is full

## 2019-11-19 NOTE — Telephone Encounter (Deleted)
Left message on voicemail with name and callback number Dijuan Sleeth RN Navigator Cardiac Imaging [facility] [phone] Office [phone] Cell  

## 2019-11-20 ENCOUNTER — Encounter (HOSPITAL_COMMUNITY): Payer: Self-pay

## 2019-11-20 ENCOUNTER — Ambulatory Visit (HOSPITAL_COMMUNITY)
Admission: RE | Admit: 2019-11-20 | Discharge: 2019-11-20 | Disposition: A | Discharge status: Home / Self Care | Payer: Medicare Other | Source: Ambulatory Visit | Attending: Internal Medicine | Admitting: Internal Medicine

## 2019-11-20 ENCOUNTER — Other Ambulatory Visit: Payer: Self-pay | Admitting: Internal Medicine

## 2019-11-20 ENCOUNTER — Other Ambulatory Visit: Payer: Self-pay

## 2019-11-20 DIAGNOSIS — I4891 Unspecified atrial fibrillation: Secondary | ICD-10-CM

## 2019-11-20 LAB — POCT I-STAT CREATININE: Creatinine, Ser: 1.4 mg/dL — ABNORMAL HIGH (ref 0.44–1.00)

## 2019-11-20 MED ORDER — DILTIAZEM HCL 25 MG/5ML IV SOLN
20.0000 mg | Freq: Once | INTRAVENOUS | Status: DC
  Filled 2019-11-20: qty 5

## 2019-11-20 MED ORDER — DILTIAZEM HCL 25 MG/5ML IV SOLN
10.0000 mg | Freq: Once | INTRAVENOUS | Status: AC
  Filled 2019-11-20: qty 5

## 2019-11-20 MED ORDER — DILTIAZEM HCL 25 MG/5ML IV SOLN
INTRAVENOUS | Status: AC
  Administered 2019-11-20: 11:00:00 10 mg via INTRAVENOUS
  Filled 2019-11-20: qty 5

## 2019-11-20 NOTE — Progress Notes (Signed)
Notified Dr Meda Coffee that patient HR is 80-100 (afib).  The patient has not received 20mg  Cardizem IV as waiting to obtain IV access.  Order received to d/c 20mg  IV cardizem and give 10mg  IV cardizem before scan

## 2019-11-20 NOTE — Progress Notes (Signed)
IV infiltrated during contrast administration for CT scan.  Appx 80cc of contrast extravasation.  Candiss Norse PA (radiology) notified and examined patient.  Pt denies pain at site at this time.  Ice p[ack to area applied.  PIV removed and dressing applied.  Lisabeth Pick RN notified Dr. Rayann Heman and Dr. Meda Coffee.  CT scan has been cancelled.

## 2019-11-20 NOTE — Progress Notes (Signed)
  Evaluation after Contrast Extravasation  Patient seen and examined immediately after contrast extravasation while in CT1 - estimated amount of contrast given 80 cc via right AC, per tech portion of this entered circulation. Patient initially reported no pain, however on my exam she endorses some mild pain at the IV insertion site - she denies pain superiorly or inferiorly to the right AC IV site  Exam: There is moderate swelling at the right [facility] area.  There is no erythema. There is no discoloration. There are no blisters. There are no signs of decreased perfusion of the skin.  It is warm to touch.  The patient has full ROM in fingers, grossly neurologically in tact.  Radial pulse normal.  Per contrast extravasation protocol, I have instructed the patient to keep an ice pack on the area for 20-60 minutes at a time for about 48 hours.   Keep arm elevated as much as possible.   The patient understands to call the radiology department if there is: - increase in pain or swelling - changed or altered sensation - ulceration or blistering - increasing redness - warmth or increasing firmness - decreased tissue perfusion as noted by decreased capillary refill or discoloration of skin - decreased pulses peripheral to site   Joaquim Nam PA-C 11/20/2019 12:44 PM

## 2019-11-20 NOTE — H&P (View-Only) (Signed)
  Evaluation after Contrast Extravasation  Patient seen and examined immediately after contrast extravasation while in CT1 - estimated amount of contrast given 80 cc via right AC, per tech portion of this entered circulation. Patient initially reported no pain, however on my exam she endorses some mild pain at the IV insertion site - she denies pain superiorly or inferiorly to the right AC IV site  Exam: There is moderate swelling at the right [facility] area.  There is no erythema. There is no discoloration. There are no blisters. There are no signs of decreased perfusion of the skin.  It is warm to touch.  The patient has full ROM in fingers, grossly neurologically in tact.  Radial pulse normal.  Per contrast extravasation protocol, I have instructed the patient to keep an ice pack on the area for 20-60 minutes at a time for about 48 hours.   Keep arm elevated as much as possible.   The patient understands to call the radiology department if there is: - increase in pain or swelling - changed or altered sensation - ulceration or blistering - increasing redness - warmth or increasing firmness - decreased tissue perfusion as noted by decreased capillary refill or discoloration of skin - decreased pulses peripheral to site   Joaquim Nam PA-C 11/20/2019 12:44 PM

## 2019-11-20 NOTE — Progress Notes (Signed)
Notified Dr. Meda Coffee that HR is 110's.  Order received to admin cardizem IV.  Attempted PIV x2, unsuccessful.  Placed IV team consult order

## 2019-11-21 ENCOUNTER — Other Ambulatory Visit (HOSPITAL_COMMUNITY)
Admission: RE | Admit: 2019-11-21 | Discharge: 2019-11-21 | Disposition: A | Discharge status: Home / Self Care | Payer: Medicare Other | Source: Ambulatory Visit | Attending: Internal Medicine | Admitting: Internal Medicine

## 2019-11-21 ENCOUNTER — Other Ambulatory Visit (HOSPITAL_COMMUNITY): Payer: Medicare Other

## 2019-11-21 ENCOUNTER — Ambulatory Visit (HOSPITAL_COMMUNITY): Payer: Medicare Other

## 2019-11-21 ENCOUNTER — Telehealth: Payer: Self-pay

## 2019-11-21 DIAGNOSIS — Z01812 Encounter for preprocedural laboratory examination: Secondary | ICD-10-CM | POA: Insufficient documentation

## 2019-11-21 DIAGNOSIS — Z20822 Contact with and (suspected) exposure to covid-19: Secondary | ICD-10-CM | POA: Diagnosis not present

## 2019-11-21 DIAGNOSIS — I4891 Unspecified atrial fibrillation: Secondary | ICD-10-CM

## 2019-11-21 LAB — SARS CORONAVIRUS 2 (TAT 6-24 HRS): SARS Coronavirus 2: NEGATIVE

## 2019-11-21 NOTE — Telephone Encounter (Signed)
Pt is aware to arrive at the hospital 11/24/19 at 1pm to have labs prior to her 3pm TEE.

## 2019-11-21 NOTE — Telephone Encounter (Signed)
Pt can get lab work prior to TEE on 3/1.    Arrive at 1:00 pm.  Orders for TEE entered, added lab work.  Tried to reach Pt.  Unable to speak with Pt.  Need to confirm she will arrive at 1:00 pm 3/1 for TEE

## 2019-11-21 NOTE — Telephone Encounter (Signed)
Pt verbalized understanding and agreed to a TEE 11/24/19... she repeated her instructions back to me.    *Pt reports she did not have labs done a [facility] when she had her COVID test done... I will forward to [place]... to see if pt can have labs at [facility] Monday 11/24/19 prior to her TEE. She can arrive 1pm as opposed to 2pm.

## 2019-11-21 NOTE — Telephone Encounter (Signed)
Pt scheduled for TEE on November 24, 2019 at 3:00 pm at [facility].  You are scheduled for a TEE on November 24, 2019 with Dr. Marlou Porch.  Please arrive at the [facility] (Main Entrance A) at [facility]: [address] [place], [place] [address] at 2:00 pm.    DIET: You may have a light breakfast on the day of your TEE.  Do NOT eat anything after 7:00 am. Nothing to eat or drink after midnight except a sip of water with medications (see medication instructions below)  Medication Instructions: You may take your normal morning medications EXCEPT:  Lasix  Continue your anticoagulant: Xarelto You will need to continue your anticoagulant after your procedure until you  are told by your  Provider that it is safe to stop   Labs: NO addl labs needed  You must have a responsible person to drive you home and stay in the waiting area during your procedure. Failure to do so could result in cancellation.  Bring your insurance cards.  *Special Note: Every effort is made to have your procedure done on time. Occasionally there are emergencies that occur at the hospital that may cause delays. Please be patient if a delay does occur.

## 2019-11-21 NOTE — Telephone Encounter (Signed)
Unable to reach pt on her number listed.. her VM is full so unable to LM... called her sister Jackelyn Poling on [place] and discussed the need to speak with the pt.. she is going to try and reach her and have her call me back ASAP.

## 2019-11-24 ENCOUNTER — Encounter (HOSPITAL_COMMUNITY)
Admission: RE | Disposition: A | Discharge status: Home / Self Care | Payer: Self-pay | Source: Home / Self Care | Attending: Cardiology

## 2019-11-24 ENCOUNTER — Other Ambulatory Visit: Payer: Self-pay

## 2019-11-24 ENCOUNTER — Other Ambulatory Visit (HOSPITAL_COMMUNITY): Payer: Self-pay | Admitting: *Deleted

## 2019-11-24 ENCOUNTER — Ambulatory Visit (HOSPITAL_COMMUNITY)
Admission: RE | Admit: 2019-11-24 | Discharge: 2019-11-24 | Disposition: A | Discharge status: Home / Self Care | Payer: Medicare Other | Attending: Cardiology | Admitting: Cardiology

## 2019-11-24 ENCOUNTER — Ambulatory Visit (HOSPITAL_BASED_OUTPATIENT_CLINIC_OR_DEPARTMENT_OTHER): Payer: Medicare Other

## 2019-11-24 ENCOUNTER — Encounter (HOSPITAL_COMMUNITY): Payer: Self-pay | Admitting: Cardiology

## 2019-11-24 DIAGNOSIS — I081 Rheumatic disorders of both mitral and tricuspid valves: Secondary | ICD-10-CM | POA: Diagnosis not present

## 2019-11-24 DIAGNOSIS — I340 Nonrheumatic mitral (valve) insufficiency: Secondary | ICD-10-CM

## 2019-11-24 DIAGNOSIS — I4891 Unspecified atrial fibrillation: Secondary | ICD-10-CM | POA: Insufficient documentation

## 2019-11-24 DIAGNOSIS — I361 Nonrheumatic tricuspid (valve) insufficiency: Secondary | ICD-10-CM

## 2019-11-24 HISTORY — DX: Type 2 diabetes mellitus without complications: E11.9

## 2019-11-24 HISTORY — PX: TEE WITHOUT CARDIOVERSION: SHX5443

## 2019-11-24 LAB — BASIC METABOLIC PANEL
Anion gap: 10 (ref 5–15)
BUN: 21 mg/dL (ref 8–23)
CO2: 28 mmol/L (ref 22–32)
Calcium: 9.4 mg/dL (ref 8.9–10.3)
Chloride: 102 mmol/L (ref 98–111)
Creatinine, Ser: 1.42 mg/dL — ABNORMAL HIGH (ref 0.44–1.00)
GFR calc Af Amer: 43 mL/min — ABNORMAL LOW (ref >60)
GFR calc non Af Amer: 37 mL/min — ABNORMAL LOW (ref >60)
Glucose, Bld: 155 mg/dL — ABNORMAL HIGH (ref 70–99)
Potassium: 3.6 mmol/L (ref 3.5–5.1)
Sodium: 140 mmol/L (ref 135–145)

## 2019-11-24 LAB — CBC WITH DIFFERENTIAL/PLATELET
Abs Immature Granulocytes: 0.02 10*3/uL (ref 0.00–0.07)
Basophils Absolute: 0.1 10*3/uL (ref 0.0–0.1)
Basophils Relative: 1 %
Eosinophils Absolute: 0.3 10*3/uL (ref 0.0–0.5)
Eosinophils Relative: 5 %
HCT: 33.8 % — ABNORMAL LOW (ref 36.0–46.0)
Hemoglobin: 10.2 g/dL — ABNORMAL LOW (ref 12.0–15.0)
Immature Granulocytes: 0 %
Lymphocytes Relative: 12 %
Lymphs Abs: 0.8 10*3/uL (ref 0.7–4.0)
MCH: 28.3 pg (ref 26.0–34.0)
MCHC: 30.2 g/dL (ref 30.0–36.0)
MCV: 93.6 fL (ref 80.0–100.0)
Monocytes Absolute: 0.4 10*3/uL (ref 0.1–1.0)
Monocytes Relative: 6 %
Neutro Abs: 5.3 10*3/uL (ref 1.7–7.7)
Neutrophils Relative %: 76 %
Platelets: 321 10*3/uL (ref 150–400)
RBC: 3.61 MIL/uL — ABNORMAL LOW (ref 3.87–5.11)
RDW: 15.1 % (ref 11.5–15.5)
WBC: 6.9 10*3/uL (ref 4.0–10.5)
nRBC: 0 % (ref 0.0–0.2)

## 2019-11-24 SURGERY — ECHOCARDIOGRAM, TRANSESOPHAGEAL
Anesthesia: Moderate Sedation

## 2019-11-24 MED ORDER — MIDAZOLAM HCL (PF) 5 MG/ML IJ SOLN
INTRAMUSCULAR | Status: AC
  Filled 2019-11-24: qty 2

## 2019-11-24 MED ORDER — MIDAZOLAM HCL (PF) 10 MG/2ML IJ SOLN
INTRAMUSCULAR | Status: DC | PRN
  Administered 2019-11-24 (×3): 1 mg via INTRAVENOUS
  Administered 2019-11-24: 2 mg via INTRAVENOUS

## 2019-11-24 MED ORDER — FENTANYL CITRATE (PF) 100 MCG/2ML IJ SOLN
INTRAMUSCULAR | Status: DC | PRN
  Administered 2019-11-24 (×3): 25 ug via INTRAVENOUS

## 2019-11-24 MED ORDER — FENTANYL CITRATE (PF) 100 MCG/2ML IJ SOLN
INTRAMUSCULAR | Status: AC
  Filled 2019-11-24: qty 4

## 2019-11-24 MED ORDER — SODIUM CHLORIDE 0.9 % IV SOLN: INTRAVENOUS | Status: DC

## 2019-11-24 NOTE — CV Procedure (Signed)
   Transesophageal Echocardiogram  Indications: AFIB ablation  Time out performed  During this procedure the patient is administered a total of Versed 5 mg and Fentanyl 75 mcg to achieve and maintain moderate conscious sedation.  The patient's heart rate, blood pressure, and oxygen saturation are monitored continuously during the procedure. The period of conscious sedation is 25 minutes, of which I was present face-to-face 100% of this time.  Findings:  Left Ventricle: 55% EF, normal  Mitral Valve: normal, trace MR  Aortic Valve: Normal  Tricuspid Valve: normal, moderate TR  Left Atrium: normal, no thrombus   Candee Furbish, MD

## 2019-11-24 NOTE — Anesthesia Preprocedure Evaluation (Addendum)
Anesthesia Evaluation  Patient identified by MRN, date of birth, ID band Patient awake    Reviewed: Allergy & Precautions, NPO status , Patient's Chart, lab work & pertinent test results  History of Anesthesia Complications (+) PONV  Airway Mallampati: II  TM Distance: >3 FB Neck ROM: Full    Dental  (+) Dental Advisory Given, Implants   Pulmonary sleep apnea (BiPAP) , COPD, former smoker (quit 1986),  11/21/2019 SARS coronavirus NEG   breath sounds clear to auscultation + decreased breath sounds      Cardiovascular hypertension, Pt. on home beta blockers and Pt. on medications (-) angina+ dysrhythmias Atrial Fibrillation  Rhythm:Irregular Rate:Tachycardia  11/24/2019 ECHO: EF 55-60%, mild MR, mod TR   Neuro/Psych  Headaches, Anxiety Chronic back pain    GI/Hepatic Neg liver ROS, GERD  Medicated and Controlled,  Endo/Other  diabetes (glu 123), Oral Hypoglycemic AgentsMorbid obesity  Renal/GU Renal InsufficiencyRenal disease (creat 1.42)     Musculoskeletal  (+) Arthritis , Fibromyalgia -  Abdominal (+) + obese,   Peds  Hematology xarelto   Anesthesia Other Findings H/o breast cancer  Reproductive/Obstetrics                            Anesthesia Physical Anesthesia Plan  ASA: IV  Anesthesia Plan: General   Post-op Pain Management:    Induction: Intravenous  PONV Risk Score and Plan: 4 or greater and Ondansetron, Dexamethasone and Treatment may vary due to age or medical condition  Airway Management Planned: Oral ETT  Additional Equipment:   Intra-op Plan:   Post-operative Plan: Extubation in OR  Informed Consent: I have reviewed the patients History and Physical, chart, labs and discussed the procedure including the risks, benefits and alternatives for the proposed anesthesia with the patient or authorized representative who has indicated his/her understanding and acceptance.      Dental advisory given  Plan Discussed with: CRNA and Surgeon  Anesthesia Plan Comments: (Plan routine monitors, GETA )       Anesthesia Quick Evaluation

## 2019-11-24 NOTE — Progress Notes (Signed)
  Echocardiogram Echocardiogram Transesophageal has been performed.  [name] 11/24/2019, 4:48 PM

## 2019-11-24 NOTE — Progress Notes (Signed)
Spoke with [name] in endo regarding her procedure tomorrow.  Reminded her nothing to eat or drink after midnight.  Also that she needs a responsible person to drive in home tomorrow as well as stay overnight with her.  She says she has not missed any of her xarelto doses

## 2019-11-25 ENCOUNTER — Ambulatory Visit (HOSPITAL_COMMUNITY): Payer: Medicare Other | Admitting: Anesthesiology

## 2019-11-25 ENCOUNTER — Encounter (HOSPITAL_COMMUNITY): Payer: Self-pay | Admitting: Internal Medicine

## 2019-11-25 ENCOUNTER — Encounter (HOSPITAL_COMMUNITY)
Admission: RE | Disposition: A | Discharge status: Home / Self Care | Payer: Self-pay | Source: Home / Self Care | Attending: Internal Medicine

## 2019-11-25 ENCOUNTER — Observation Stay (HOSPITAL_COMMUNITY)
Admission: RE | Admit: 2019-11-25 | Discharge: 2019-11-26 | Disposition: A | Discharge status: Home / Self Care | Payer: Medicare Other | Attending: Internal Medicine | Admitting: Internal Medicine

## 2019-11-25 ENCOUNTER — Other Ambulatory Visit (HOSPITAL_COMMUNITY): Payer: Self-pay | Admitting: Nurse Practitioner

## 2019-11-25 DIAGNOSIS — Z881 Allergy status to other antibiotic agents status: Secondary | ICD-10-CM | POA: Insufficient documentation

## 2019-11-25 DIAGNOSIS — Z886 Allergy status to analgesic agent status: Secondary | ICD-10-CM | POA: Insufficient documentation

## 2019-11-25 DIAGNOSIS — I10 Essential (primary) hypertension: Secondary | ICD-10-CM | POA: Insufficient documentation

## 2019-11-25 DIAGNOSIS — J449 Chronic obstructive pulmonary disease, unspecified: Secondary | ICD-10-CM | POA: Insufficient documentation

## 2019-11-25 DIAGNOSIS — M1990 Unspecified osteoarthritis, unspecified site: Secondary | ICD-10-CM | POA: Diagnosis not present

## 2019-11-25 DIAGNOSIS — M797 Fibromyalgia: Secondary | ICD-10-CM | POA: Insufficient documentation

## 2019-11-25 DIAGNOSIS — G4733 Obstructive sleep apnea (adult) (pediatric): Secondary | ICD-10-CM | POA: Insufficient documentation

## 2019-11-25 DIAGNOSIS — Z9012 Acquired absence of left breast and nipple: Secondary | ICD-10-CM | POA: Diagnosis not present

## 2019-11-25 DIAGNOSIS — Z888 Allergy status to other drugs, medicaments and biological substances status: Secondary | ICD-10-CM | POA: Diagnosis not present

## 2019-11-25 DIAGNOSIS — Z7984 Long term (current) use of oral hypoglycemic drugs: Secondary | ICD-10-CM | POA: Insufficient documentation

## 2019-11-25 DIAGNOSIS — Z8249 Family history of ischemic heart disease and other diseases of the circulatory system: Secondary | ICD-10-CM | POA: Diagnosis not present

## 2019-11-25 DIAGNOSIS — Z79899 Other long term (current) drug therapy: Secondary | ICD-10-CM | POA: Diagnosis not present

## 2019-11-25 DIAGNOSIS — E785 Hyperlipidemia, unspecified: Secondary | ICD-10-CM | POA: Insufficient documentation

## 2019-11-25 DIAGNOSIS — Z7951 Long term (current) use of inhaled steroids: Secondary | ICD-10-CM | POA: Diagnosis not present

## 2019-11-25 DIAGNOSIS — Z6841 Body Mass Index (BMI) 40.0 and over, adult: Secondary | ICD-10-CM | POA: Diagnosis not present

## 2019-11-25 DIAGNOSIS — I4819 Other persistent atrial fibrillation: Principal | ICD-10-CM | POA: Diagnosis present

## 2019-11-25 DIAGNOSIS — Z96659 Presence of unspecified artificial knee joint: Secondary | ICD-10-CM | POA: Insufficient documentation

## 2019-11-25 DIAGNOSIS — Z87891 Personal history of nicotine dependence: Secondary | ICD-10-CM | POA: Insufficient documentation

## 2019-11-25 DIAGNOSIS — Z7901 Long term (current) use of anticoagulants: Secondary | ICD-10-CM | POA: Insufficient documentation

## 2019-11-25 HISTORY — PX: ATRIAL FIBRILLATION ABLATION: EP1191

## 2019-11-25 LAB — POCT ACTIVATED CLOTTING TIME
Activated Clotting Time: 274 seconds
Activated Clotting Time: 307 seconds

## 2019-11-25 LAB — GLUCOSE, CAPILLARY
Glucose-Capillary: 123 mg/dL — ABNORMAL HIGH (ref 70–99)
Glucose-Capillary: 126 mg/dL — ABNORMAL HIGH (ref 70–99)

## 2019-11-25 SURGERY — ATRIAL FIBRILLATION ABLATION
Anesthesia: General

## 2019-11-25 MED ORDER — FUROSEMIDE 40 MG PO TABS
40.0000 mg | ORAL_TABLET | Freq: Every day | ORAL | Status: DC
  Administered 2019-11-25 – 2019-11-26 (×2): 40 mg via ORAL
  Filled 2019-11-25 (×2): qty 1

## 2019-11-25 MED ORDER — POTASSIUM CHLORIDE CRYS ER 10 MEQ PO TBCR
10.0000 meq | EXTENDED_RELEASE_TABLET | Freq: Every day | ORAL | Status: DC
  Administered 2019-11-25 – 2019-11-26 (×2): 10 meq via ORAL
  Filled 2019-11-25 (×2): qty 1

## 2019-11-25 MED ORDER — ATORVASTATIN CALCIUM 10 MG PO TABS
10.0000 mg | ORAL_TABLET | Freq: Every day | ORAL | Status: DC
  Administered 2019-11-25 – 2019-11-26 (×2): 10 mg via ORAL
  Filled 2019-11-25 (×2): qty 1

## 2019-11-25 MED ORDER — RIVAROXABAN 20 MG PO TABS
20.0000 mg | ORAL_TABLET | Freq: Every evening | ORAL | Status: DC
  Administered 2019-11-25: 20:00:00 20 mg via ORAL
  Filled 2019-11-25: qty 1

## 2019-11-25 MED ORDER — FLUTICASONE FUROATE-VILANTEROL 200-25 MCG/INH IN AEPB
1.0000 | INHALATION_SPRAY | Freq: Every day | RESPIRATORY_TRACT | Status: DC
  Administered 2019-11-25 – 2019-11-26 (×2): 1 via RESPIRATORY_TRACT
  Filled 2019-11-25: qty 28

## 2019-11-25 MED ORDER — FLECAINIDE ACETATE 50 MG PO TABS
100.0000 mg | ORAL_TABLET | Freq: Two times a day (BID) | ORAL | Status: DC
  Administered 2019-11-26 (×2): 100 mg via ORAL
  Filled 2019-11-25 (×2): qty 2

## 2019-11-25 MED ORDER — PAROXETINE HCL 20 MG PO TABS
20.0000 mg | ORAL_TABLET | Freq: Every day | ORAL | Status: DC
  Administered 2019-11-25 – 2019-11-26 (×2): 20 mg via ORAL
  Filled 2019-11-25 (×2): qty 1

## 2019-11-25 MED ORDER — SODIUM CHLORIDE 0.9% FLUSH
3.0000 mL | Freq: Two times a day (BID) | INTRAVENOUS | Status: DC
  Administered 2019-11-25 – 2019-11-26 (×3): 3 mL via INTRAVENOUS

## 2019-11-25 MED ORDER — DILTIAZEM HCL ER COATED BEADS 240 MG PO CP24
240.0000 mg | ORAL_CAPSULE | Freq: Every day | ORAL | Status: DC
  Administered 2019-11-25 – 2019-11-26 (×2): 240 mg via ORAL
  Filled 2019-11-25 (×2): qty 1

## 2019-11-25 MED ORDER — HEPARIN SODIUM (PORCINE) 1000 UNIT/ML IJ SOLN
INTRAMUSCULAR | Status: DC | PRN
  Administered 2019-11-25: 5000 [IU] via INTRAVENOUS
  Administered 2019-11-25: 3000 [IU] via INTRAVENOUS

## 2019-11-25 MED ORDER — FLECAINIDE ACETATE 100 MG PO TABS: 100.0000 mg | ORAL_TABLET | Freq: Two times a day (BID) | ORAL | Status: DC

## 2019-11-25 MED ORDER — PROTAMINE SULFATE 10 MG/ML IV SOLN
INTRAVENOUS | Status: DC | PRN
  Administered 2019-11-25: 30 mg via INTRAVENOUS
  Administered 2019-11-25: 10 mg via INTRAVENOUS

## 2019-11-25 MED ORDER — SUGAMMADEX SODIUM 200 MG/2ML IV SOLN
INTRAVENOUS | Status: DC | PRN
  Administered 2019-11-25: 200 mg via INTRAVENOUS

## 2019-11-25 MED ORDER — PROMETHAZINE HCL 25 MG/ML IJ SOLN: 6.2500 mg | INTRAMUSCULAR | Status: DC | PRN

## 2019-11-25 MED ORDER — ASPIRIN EC 81 MG PO TBEC
81.0000 mg | DELAYED_RELEASE_TABLET | Freq: Every day | ORAL | Status: DC
  Administered 2019-11-26: 81 mg via ORAL
  Filled 2019-11-25: qty 1

## 2019-11-25 MED ORDER — ANASTROZOLE 1 MG PO TABS
1.0000 mg | ORAL_TABLET | Freq: Every day | ORAL | Status: DC
  Administered 2019-11-25 – 2019-11-26 (×2): 1 mg via ORAL
  Filled 2019-11-25 (×2): qty 1

## 2019-11-25 MED ORDER — FENTANYL CITRATE (PF) 100 MCG/2ML IJ SOLN: 25.0000 ug | INTRAMUSCULAR | Status: DC | PRN

## 2019-11-25 MED ORDER — ONDANSETRON HCL 4 MG/2ML IJ SOLN: 4.0000 mg | Freq: Four times a day (QID) | INTRAMUSCULAR | Status: DC | PRN

## 2019-11-25 MED ORDER — FENTANYL CITRATE (PF) 250 MCG/5ML IJ SOLN
INTRAMUSCULAR | Status: DC | PRN
  Administered 2019-11-25: 100 ug via INTRAVENOUS

## 2019-11-25 MED ORDER — ACETAMINOPHEN 325 MG PO TABS: 650.0000 mg | ORAL_TABLET | ORAL | Status: DC | PRN

## 2019-11-25 MED ORDER — SODIUM CHLORIDE 0.9% FLUSH: 3.0000 mL | INTRAVENOUS | Status: DC | PRN

## 2019-11-25 MED ORDER — SODIUM CHLORIDE 0.9 % IV SOLN: 250.0000 mL | INTRAVENOUS | Status: DC | PRN

## 2019-11-25 MED ORDER — HEPARIN SODIUM (PORCINE) 1000 UNIT/ML IJ SOLN
INTRAMUSCULAR | Status: AC
  Filled 2019-11-25: qty 1

## 2019-11-25 MED ORDER — MIDAZOLAM HCL 2 MG/2ML IJ SOLN: 0.5000 mg | Freq: Once | INTRAMUSCULAR | Status: DC | PRN

## 2019-11-25 MED ORDER — METFORMIN HCL 500 MG PO TABS
500.0000 mg | ORAL_TABLET | Freq: Two times a day (BID) | ORAL | Status: DC
  Administered 2019-11-25 – 2019-11-26 (×2): 500 mg via ORAL
  Filled 2019-11-25 (×2): qty 1

## 2019-11-25 MED ORDER — SODIUM CHLORIDE 0.9 % IV SOLN: INTRAVENOUS | Status: DC

## 2019-11-25 MED ORDER — ROCURONIUM BROMIDE 10 MG/ML (PF) SYRINGE
PREFILLED_SYRINGE | INTRAVENOUS | Status: DC | PRN
  Administered 2019-11-25: 20 mg via INTRAVENOUS
  Administered 2019-11-25: 60 mg via INTRAVENOUS

## 2019-11-25 MED ORDER — FLUTICASONE PROPIONATE 50 MCG/ACT NA SUSP
2.0000 | Freq: Every day | NASAL | Status: DC | PRN
  Filled 2019-11-25: qty 16

## 2019-11-25 MED ORDER — HEPARIN SODIUM (PORCINE) 1000 UNIT/ML IJ SOLN
INTRAMUSCULAR | Status: DC | PRN
  Administered 2019-11-25: 1000 [IU] via INTRAVENOUS
  Administered 2019-11-25: 14000 [IU] via INTRAVENOUS

## 2019-11-25 MED ORDER — MIDAZOLAM HCL 2 MG/2ML IJ SOLN
INTRAMUSCULAR | Status: DC | PRN
  Administered 2019-11-25: .5 mg via INTRAVENOUS

## 2019-11-25 MED ORDER — ONDANSETRON HCL 4 MG/2ML IJ SOLN
INTRAMUSCULAR | Status: DC | PRN
  Administered 2019-11-25: 4 mg via INTRAVENOUS

## 2019-11-25 MED ORDER — PROPOFOL 10 MG/ML IV BOLUS
INTRAVENOUS | Status: DC | PRN
  Administered 2019-11-25: 120 mg via INTRAVENOUS
  Administered 2019-11-25: 20 mg via INTRAVENOUS

## 2019-11-25 MED ORDER — PHENYLEPHRINE HCL-NACL 10-0.9 MG/250ML-% IV SOLN
INTRAVENOUS | Status: DC | PRN
  Administered 2019-11-25: 50 ug/min via INTRAVENOUS

## 2019-11-25 MED ORDER — DEXAMETHASONE SODIUM PHOSPHATE 10 MG/ML IJ SOLN
INTRAMUSCULAR | Status: DC | PRN
  Administered 2019-11-25: 5 mg via INTRAVENOUS

## 2019-11-25 MED ORDER — PANTOPRAZOLE SODIUM 40 MG PO TBEC
80.0000 mg | DELAYED_RELEASE_TABLET | Freq: Every day | ORAL | Status: DC
  Administered 2019-11-26: 09:00:00 80 mg via ORAL
  Filled 2019-11-25: qty 2

## 2019-11-25 MED ORDER — NITROGLYCERIN 0.4 MG SL SUBL: 0.4000 mg | SUBLINGUAL_TABLET | SUBLINGUAL | Status: DC | PRN

## 2019-11-25 MED ORDER — ALBUTEROL SULFATE (2.5 MG/3ML) 0.083% IN NEBU
INHALATION_SOLUTION | RESPIRATORY_TRACT | Status: AC
  Administered 2019-11-25: 14:00:00 2.5 mg via RESPIRATORY_TRACT
  Filled 2019-11-25: qty 3

## 2019-11-25 MED ORDER — PHENYLEPHRINE 40 MCG/ML (10ML) SYRINGE FOR IV PUSH (FOR BLOOD PRESSURE SUPPORT)
PREFILLED_SYRINGE | INTRAVENOUS | Status: DC | PRN
  Administered 2019-11-25: 80 ug via INTRAVENOUS
  Administered 2019-11-25: 40 ug via INTRAVENOUS
  Administered 2019-11-25: 120 ug via INTRAVENOUS
  Administered 2019-11-25 (×2): 80 ug via INTRAVENOUS

## 2019-11-25 MED ORDER — LIDOCAINE 2% (20 MG/ML) 5 ML SYRINGE
INTRAMUSCULAR | Status: DC | PRN
  Administered 2019-11-25: 40 mg via INTRAVENOUS

## 2019-11-25 MED ORDER — HEPARIN (PORCINE) IN NACL 2-0.9 UNITS/ML
INTRAMUSCULAR | Status: AC | PRN
  Administered 2019-11-25: 500 mL

## 2019-11-25 MED ORDER — MEPERIDINE HCL 25 MG/ML IJ SOLN: 6.2500 mg | INTRAMUSCULAR | Status: DC | PRN

## 2019-11-25 MED ORDER — ALBUTEROL SULFATE (2.5 MG/3ML) 0.083% IN NEBU: 2.5000 mg | INHALATION_SOLUTION | RESPIRATORY_TRACT | Status: DC | PRN

## 2019-11-25 MED ORDER — BISOPROLOL FUMARATE 10 MG PO TABS
10.0000 mg | ORAL_TABLET | Freq: Every day | ORAL | Status: DC
  Administered 2019-11-25 – 2019-11-26 (×2): 10 mg via ORAL
  Filled 2019-11-25 (×2): qty 1

## 2019-11-25 MED ORDER — ALBUTEROL SULFATE (2.5 MG/3ML) 0.083% IN NEBU
2.5000 mg | INHALATION_SOLUTION | Freq: Once | RESPIRATORY_TRACT | Status: AC
  Filled 2019-11-25: qty 3

## 2019-11-25 MED ORDER — ALBUTEROL SULFATE 108 (90 BASE) MCG/ACT IN AEPB: 2.0000 | INHALATION_SPRAY | RESPIRATORY_TRACT | Status: DC | PRN

## 2019-11-25 MED ORDER — LOSARTAN POTASSIUM 50 MG PO TABS: 100.0000 mg | ORAL_TABLET | Freq: Every day | ORAL | Status: DC

## 2019-11-25 SURGICAL SUPPLY — 20 items
BLANKET WARM UNDERBOD FULL ACC (MISCELLANEOUS) ×5 IMPLANT
CATH MAPPNG PENTARAY F 2-6-2MM (CATHETERS) IMPLANT
CATH SMTCH THERMOCOOL SF DF (CATHETERS) ×2 IMPLANT
CATH SOUNDSTAR ECO 8FR (CATHETERS) ×2 IMPLANT
CATH WEBSTER BI DIR CS D-F CRV (CATHETERS) ×2 IMPLANT
COVER SWIFTLINK CONNECTOR (BAG) ×3 IMPLANT
DEVICE CLOSURE PERCLS PRGLD 6F (VASCULAR PRODUCTS) IMPLANT
NDL BAYLIS TRANSSEPTAL 71CM (NEEDLE) IMPLANT
NEEDLE BAYLIS TRANSSEPTAL 71CM (NEEDLE) ×3 IMPLANT
PACK EP LATEX FREE (CUSTOM PROCEDURE TRAY) ×3
PACK EP LF (CUSTOM PROCEDURE TRAY) ×1 IMPLANT
PAD PRO RADIOLUCENT 2001M-C (PAD) ×3 IMPLANT
PATCH CARTO3 (PAD) ×2 IMPLANT
PENTARAY F 2-6-2MM (CATHETERS) ×3
PERCLOSE PROGLIDE 6F (VASCULAR PRODUCTS) ×9
SHEATH PINNACLE 7F 10CM (SHEATH) ×4 IMPLANT
SHEATH PINNACLE 9F 10CM (SHEATH) ×2 IMPLANT
SHEATH PROBE COVER 6X72 (BAG) ×2 IMPLANT
SHEATH SWARTZ TS SL2 63CM 8.5F (SHEATH) ×2 IMPLANT
TUBING SMART ABLATE COOLFLOW (TUBING) ×2 IMPLANT

## 2019-11-25 NOTE — Progress Notes (Signed)
Reported to RN on 6E that Dr. Rayann Heman has put in an order for patient to take xarelto .

## 2019-11-25 NOTE — Plan of Care (Signed)
  Problem: Education: Goal: Knowledge of General Education information will improve Description: Including pain rating scale, medication(s)/side effects and non-pharmacologic comfort measures Outcome: Progressing   Problem: Health Behavior/Discharge Planning: Goal: Ability to manage health-related needs will improve Outcome: Progressing   Problem: Clinical Measurements: Goal: Cardiovascular complication will be avoided Outcome: Progressing   Problem: Nutrition: Goal: Adequate nutrition will be maintained Outcome: Progressing   Problem: Coping: Goal: Level of anxiety will decrease Outcome: Progressing   Problem: Pain Managment: Goal: General experience of comfort will improve Outcome: Progressing  Elesa Hacker, RN

## 2019-11-25 NOTE — Anesthesia Postprocedure Evaluation (Signed)
Anesthesia Post Note  Patient: [name]  Procedure(s) Performed: ATRIAL FIBRILLATION ABLATION (N/A )     Patient location during evaluation: Short Stay Anesthesia Type: General Level of consciousness: patient cooperative, oriented and sedated Pain management: pain level controlled Vital Signs Assessment: post-procedure vital signs reviewed and stable Respiratory status: spontaneous breathing, nonlabored ventilation, respiratory function stable and patient connected to nasal cannula oxygen Cardiovascular status: blood pressure returned to baseline and stable Postop Assessment: no apparent nausea or vomiting Anesthetic complications: no    Last Vitals:  Vitals:   11/25/19 1525 11/25/19 1625  BP: (!) 113/46 (!) 99/46  Pulse: 70 67  Resp: 17 19  Temp:    SpO2: 92% 94%    Last Pain:  Vitals:   11/25/19 1140  TempSrc:   PainSc: 0-No pain                 Dionysios Massman,E. Analynn Daum

## 2019-11-25 NOTE — Anesthesia Procedure Notes (Signed)
Procedure Name: Intubation Date/Time: 11/25/2019 7:52 AM Performed by: Verdie Drown, CRNA Pre-anesthesia Checklist: Patient identified, Emergency Drugs available, Suction available and Patient being monitored Patient Re-evaluated:Patient Re-evaluated prior to induction Oxygen Delivery Method: Circle System Utilized Preoxygenation: Pre-oxygenation with 100% oxygen Induction Type: IV induction Ventilation: Two handed mask ventilation required Laryngoscope Size: Mac and 3 Grade View: Grade I Tube type: Oral Tube size: 7.0 mm Number of attempts: 1 Airway Equipment and Method: Stylet,  Oral airway and Video-laryngoscopy Placement Confirmation: ETT inserted through vocal cords under direct vision,  positive ETCO2 and breath sounds checked- equal and bilateral Secured at: 21 cm Tube secured with: Tape Dental Injury: Teeth and Oropharynx as per pre-operative assessment  Difficulty Due To: Difficulty was anticipated, Difficult Airway- due to large tongue, Difficult Airway- due to reduced neck mobility and Difficult Airway- due to limited oral opening

## 2019-11-25 NOTE — Progress Notes (Signed)
Called report to [place], RN on 6E. Transferrred to 6E02 via bed on 1L O2 via [place].

## 2019-11-25 NOTE — Progress Notes (Signed)
Assumed care of patient from Fredda Hammed, RN. Assessment documented.

## 2019-11-25 NOTE — Interval H&P Note (Signed)
History and Physical Interval Note:  Per Dr. Rayann Heman, TEE prior to previously cancelled ablation No CP, no SOB GEN: Well nourished, well developed, in no acute distress obese HEENT: normal Neck: no JVD, carotid bruits, or masses Cardiac: IRRR; no murmurs, rubs, or gallops,no edema  Respiratory:  clear to auscultation bilaterally, normal work of breathing GI: soft, nontender, nondistended, + BS MS: no deformity or atrophy Skin: warm and dry, no rash Neuro:  Alert and Oriented x 3, Strength and sensation are intact Psych: euthymic mood, full affect  Labs reviewed  AFIB  - TEE today, ablation tomorrow  11/25/2019 7:14 AM  [name]  has presented today for surgery, with the diagnosis of AFIB.  The various methods of treatment have been discussed with the patient and family. After consideration of risks, benefits and other options for treatment, the patient has consented to  Procedure(s): TRANSESOPHAGEAL ECHOCARDIOGRAM (TEE) (N/A) as a surgical intervention.  The patient's history has been reviewed, patient examined, no change in status, stable for surgery.  I have reviewed the patient's chart and labs.  Questions were answered to the patient's satisfaction.     [employer]

## 2019-11-25 NOTE — Transfer of Care (Signed)
Immediate Anesthesia Transfer of Care Note  Patient: [name]  Procedure(s) Performed: ATRIAL FIBRILLATION ABLATION (N/A )  Patient Location: PACU and Cath Lab  Anesthesia Type:General  Level of Consciousness: patient cooperative and responds to stimulation  Airway & Oxygen Therapy: Patient Spontanous Breathing and Patient connected to face mask oxygen  Post-op Assessment: Report given to RN and Post -op Vital signs reviewed and stable  Post vital signs: Reviewed and stable  Last Vitals:  Vitals Value Taken Time  BP 200/70 11/25/19 1032  Temp 36.4 C 11/25/19 1031  Pulse 74 11/25/19 1036  Resp 18 11/25/19 1036  SpO2 100 % 11/25/19 1036  Vitals shown include unvalidated device data.  Last Pain:  Vitals:   11/25/19 1031  TempSrc: Temporal  PainSc: Asleep         Complications: No apparent anesthesia complications

## 2019-11-25 NOTE — H&P (Signed)
Chief Complaint: palpitations   History of Present Illness:    [name] is a 68 y.o. female who presents for afib ablation. Since last being seen in our clinic, the patient reports doing reasonably well. She underwent cardioversion of her afib but has already returned to afib. + palpitations and fatigue. Today, she denies symptoms of exertional chest pain, shortness of breath, lower extremity edema, dizziness, presyncope, or syncope. The patient is otherwise without complaint today. The patient denies symptoms of fevers, chills, cough, or new SOB worrisome for COVID 19.       Past Medical History:  Diagnosis Date  . Acute renal failure ([place]) 01/27/2015  . Acute respiratory failure ([place]) 01/27/2015  . Adenomatous colon polyp 1993  . Allergy   . Anemia   . Anxiety   . Anxiety disorder   . Asthma    Spirometry 1989: FEV1 2.12 (71%) with ratio 82. HFA 90% after coaching 11-09-10  . Atrial flutter ([place]) chadsvasc of 3 (11/19/14)   Status post cardioversion 2-12 2013. s/p RFCA 5/13 with JAllred (coumadin and amio stopped)   . Breast cancer ([place])    left  . COPD (chronic obstructive pulmonary disease) ([place])   . DDD (degenerative disc disease)   . Diverticulosis   . Essential hypertension, benign   . Fibromyalgia   . GERD (gastroesophageal reflux disease)    EGD with HH / esophagitis 06-30-93  . Hiatal hernia   . History of atrial fibrillation   . Hyperlipidemia   . Internal and external hemorrhoids without complication   . Lipoma of colon   . Migraine   . Osteoarthritis   . Pelvic fracture ([place])   . Personal history of colonic polyps 06/14/2010  . Pneumonia   . PONV (postoperative nausea and vomiting)   . Radiation    left breast cancer  . Sleep apnea    cpap        Past Surgical History:  Procedure Laterality Date  . A FLUTTER ABLATION  02/22/2012  . ATRIAL FLUTTER ABLATION N/A 02/22/2012   Procedure: ATRIAL FLUTTER ABLATION; Surgeon: Thompson Grayer, MD; Location[facility]  CATH LAB; Service: Cardiovascular; Laterality: N/A;  . BREAST BIOPSY Left 11/2014  . CARDIOVERSION N/A 02/19/2014   Procedure: CARDIOVERSION; Surgeon: Dorothy Spark, MD; Location: [place]; Service: Cardiovascular; Laterality: N/A;  . CARDIOVERSION N/A 07/22/2019   Procedure: CARDIOVERSION; Surgeon: Josue Hector, MD; Location: [facility] ENDOSCOPY; Service: Cardiovascular; Laterality: N/A;  . COLONOSCOPY  05/2010, 07/2000  . DILATION AND CURETTAGE OF UTERUS    . ESOPHAGOGASTRODUODENOSCOPY  05/2010, 06/1993  . JOINT REPLACEMENT    . POLYPECTOMY    . RADIOACTIVE SEED GUIDED PARTIAL MASTECTOMY WITH AXILLARY SENTINEL LYMPH NODE BIOPSY Left 01/26/2015   Procedure: LEFT PARTIAL MASTECTOMY WITH RADIOACTIVE SEED LOCALIZATION, LEFT AXILLARY SENTINEL LYMPH NODE BIOPSY; Surgeon: Fanny Skates, MD; Location: [facility]; Service: General; Laterality: Left;  . ROTATOR CUFF REPAIR     bilateral  . ROTATOR CUFF REPAIR     bil  . TOTAL KNEE ARTHROPLASTY     x2 09/2006  . UPPER GASTROINTESTINAL ENDOSCOPY           Current Outpatient Medications  Medication Sig Dispense Refill  . albuterol (PROVENTIL) (2.5 MG/3ML) 0.083% nebulizer solution Take 3 mLs (2.5 mg total) by nebulization every 4 (four) hours as needed for wheezing or shortness of breath (dx: J45.991). 100 vial 5  . ALPRAZolam (XANAX) 0.5 MG tablet Take 0.5 mg by mouth 2 (two) times daily as needed for  anxiety.   0  . anastrozole (ARIMIDEX) 1 MG tablet TAKE 1 TABLET BY MOUTH EVERY DAY(NEED APPOINTMENT FOR MORE REFILLS) (Patient taking differently: Take 1 mg by mouth daily. ) 90 tablet 3  . atorvastatin (LIPITOR) 10 MG tablet Take 10 mg by mouth daily.  0  . bisoprolol (ZEBETA) 10 MG tablet Take 1 tablet (10 mg total) by mouth daily. 30 tablet 2  . calcium-vitamin D (OSCAL WITH D) 500-200 MG-UNIT per tablet Take 1 tablet by mouth daily with breakfast. 30 tablet 3  . celecoxib (CELEBREX) 200 MG capsule Take 200 mg by mouth 3 (three) times a week.   0  .  Cholecalciferol (VITAMIN D3) 10 MCG (400 UNIT) CAPS Take 400 Units by mouth daily.    . cyclobenzaprine (FLEXERIL) 10 MG tablet Take 10 mg by mouth 3 (three) times daily as needed for muscle spasms.    Marland Kitchen diltiazem (CARDIZEM CD) 180 MG 24 hr capsule Take 180 mg by mouth daily.    Marland Kitchen diltiazem (CARTIA XT) 240 MG 24 hr capsule Take 1 capsule (240 mg total) by mouth daily. 30 capsule 11  . flecainide (TAMBOCOR) 50 MG tablet Take 1 tablet (50 mg total) by mouth 2 (two) times daily. 180 tablet 0  . fluticasone (FLONASE) 50 MCG/ACT nasal spray Place 2 sprays into both nostrils daily as needed for allergies or rhinitis.    . furosemide (LASIX) 40 MG tablet Take 40 mg by mouth 3 (three) times a week.   0  . GARCINIA CAMBOGIA-CHROMIUM PO Take 1 tablet by mouth daily.    . hydrOXYzine (ATARAX/VISTARIL) 25 MG tablet Take 25 mg by mouth every 6 (six) hours as needed for itching.   0  . losartan (COZAAR) 100 MG tablet Take 100 mg by mouth daily.   1  . metFORMIN (GLUCOPHAGE) 500 MG tablet Take 500 mg by mouth 2 (two) times daily with a meal.     . nystatin (NYAMYC) powder Apply topically 2 (two) times daily as needed. 60 g 2  . omeprazole (PRILOSEC) 40 MG capsule take 1 capsule by mouth once daily BEFORE BREAKFAST (Patient taking differently: Take 40 mg by mouth daily before breakfast. ) 90 capsule 0  . PARoxetine (PAXIL) 20 MG tablet Take 20 mg by mouth daily.    . potassium chloride (K-DUR) 10 MEQ tablet Take 10 mEq by mouth daily.   0  . PROAIR RESPICLICK 123XX123 (90 Base) MCG/ACT AEPB INHALE 2 PUFFS BY MOUTH EVERY 4 HOURS AS NEEDED (Patient taking differently: Inhale 2 puffs into the lungs every 4 (four) hours as needed (shortness of breath or wheezing). ) 1 each 2  . promethazine (PHENERGAN) 25 MG tablet Take 25 mg by mouth every 4 (four) hours as needed for nausea or vomiting.     . SYMBICORT 160-4.5 MCG/ACT inhaler Inhale 2 puffs into the lungs 2 (two) times daily. NEEDS APPT FOR MORE REFILLS 1 Inhaler 1  .  temazepam (RESTORIL) 15 MG capsule Take 1 capsule (15 mg total) by mouth at bedtime as needed for sleep. 30 capsule 0  . temazepam (RESTORIL) 30 MG capsule Take 30 mg by mouth at bedtime as needed for sleep.     . vitamin E 400 UNIT capsule Take 400 Units by mouth daily.    Alveda Reasons 20 MG TABS tablet TAKE 1 TABLET BY MOUTH EVERY DAY WITH SUPPER (Patient taking differently: Take 20 mg by mouth daily with supper. ) 30 tablet 6   No current facility-administered  medications for this visit.             Facility-Administered Medications Ordered in Other Visits  Medication Dose Route Frequency Provider Last Rate Last Dose  . 0.9 % sodium chloride infusion  Intravenous Continuous Penland, Kelby Fam, MD     Allergies: Shellfish allergy, Ace inhibitors, Aminophylline, Epinephrine, Theophyllines, and Propoxyphene hcl  Social History: The patient reports that she quit smoking about 34 years ago. Her smoking use included cigarettes. She has a 20.00 pack-year smoking history. She has never used smokeless tobacco. She reports that she does not drink alcohol or use drugs.   Family History: The patient's family history includes Breast cancer (age of onset: 72) in her maternal aunt and mother; Breast cancer (age of onset: 65) in her sister; Colon cancer (age of onset: 3) in her paternal uncle; Diabetes in her maternal grandmother and paternal grandmother; Emphysema in her father; Heart Problems in her maternal grandfather, paternal uncle, and sister; Heart disease in her father and sister; Lung cancer (age of onset: 22) in her maternal uncle; Lung cancer (age of onset: 32) in her maternal uncle; Stroke in her paternal grandfather.   ROS: Please see the history of present illness. All other systems are personally reviewed and negative.   Physical Exam: Vitals:   11/25/19 0546  BP: 131/85  Pulse: (!) 114  Resp: 16  Temp: 99 F (37.2 C)  TempSrc: Oral  SpO2: 97%  Weight: 108.4 kg  Height: 5\' 6"  (1.676 m)     GEN- The patient is overweight appearing, alert and oriented x 3 today.   Head- normocephalic, atraumatic Eyes-  Sclera clear, conjunctiva pink Ears- hearing intact Oropharynx- clear Neck- supple, Lungs- normal work of breathing Heart- irregular rate and rhythm  GI- soft  MS- no significant deformity or atrophy Skin- no rash or lesion Psych- euthymic mood, full affect Neuro- strength and sensation are intact   ASSESSMENT & PLAN:   1. Atrial fibrillation  The patient has symptomatic, recurrent persistent atrial fibrillation.  She is s/p prior CTI ablation by me in 2013  she has failed medical therapy with flecainide.  Chads2vasc score is 4. she is anticoagulated with xarelto .   She presents for AF ablation She reports compliance with xarelto without interruption.  TEE from yesterday is reviewed.  Risk, benefits, and alternatives to EP study and radiofrequency ablation for afib were also discussed in detail today. These risks include but are not limited to stroke, bleeding, vascular damage, tamponade, perforation, damage to the esophagus, lungs, and other structures, pulmonary vein stenosis, worsening renal function, and death. The patient understands these risk and wishes to proceed.  Thompson Grayer MD, [facility] 11/25/2019 7:23 AM

## 2019-11-26 ENCOUNTER — Telehealth: Payer: Self-pay | Admitting: Internal Medicine

## 2019-11-26 DIAGNOSIS — I480 Paroxysmal atrial fibrillation: Secondary | ICD-10-CM

## 2019-11-26 DIAGNOSIS — I4819 Other persistent atrial fibrillation: Secondary | ICD-10-CM

## 2019-11-26 DIAGNOSIS — E785 Hyperlipidemia, unspecified: Secondary | ICD-10-CM | POA: Diagnosis not present

## 2019-11-26 DIAGNOSIS — I10 Essential (primary) hypertension: Secondary | ICD-10-CM | POA: Diagnosis not present

## 2019-11-26 LAB — GLUCOSE, CAPILLARY: Glucose-Capillary: 158 mg/dL — ABNORMAL HIGH (ref 70–99)

## 2019-11-26 MED ORDER — RIVAROXABAN 20 MG PO TABS: ORAL_TABLET | ORAL | 1 refills | Status: DC

## 2019-11-26 NOTE — Telephone Encounter (Signed)
*  STAT* If patient is at the pharmacy, call can be transferred to refill team.   1. Which medications need to be refilled? (please list name of each medication and dose if known)  XARELTO 20 MG TABS tablet  2. Which pharmacy/location (including street and city if local pharmacy) is medication to be sent to? [facility] [place]  3. Do they need a 30 day or 90 day supply? 90 with refills  Pt is out of medication

## 2019-11-26 NOTE — Progress Notes (Signed)
RT brought CPAP machine to patients room.  I showed patient two different types of mask that we have - a full face mask and one that only covers patient nose.  Patient did not want to try either mask.  Patient on 1 1/2 L satting 97%.  CPAP left at patients bedside incase she changes her mind.

## 2019-11-26 NOTE — Discharge Instructions (Signed)
Post procedure care instructions No driving for 4 days. No lifting over 5 lbs for 1 week. No vigorous or sexual activity for 1 week. You may return to work/your usual activities on 12/02/2019. Keep procedure site clean & dry. If you notice increased pain, swelling, bleeding or pus, call/return!  You may shower, but no soaking baths/hot tubs/pools for 1 week.   You have an appointment set up with the [facility].  Multiple studies have shown that being followed by a dedicated atrial fibrillation clinic in addition to the standard care you receive from your other physicians improves health. We believe that enrollment in the atrial fibrillation clinic will allow [place] to better care for you.   The phone number to the [facility] is [phone]. The clinic is staffed Monday through Friday from 8:30am to 5pm.  Parking Directions: The clinic is located in the Heart and Vascular Building connected to [facility]. 1)From [address] turn on to [address] and go to the 3rd entrance  (Heart and Vascular entrance) on the right. 2)Look to the right for Heart &Vascular Parking Garage. 3)A code for the entrance is required please call the clinic to receive this.   4)Take the elevators to the 1st floor. Registration is in the room with the glass walls at the end of the hallway.  If you have any trouble parking or locating the clinic, please dont hesitate to call [phone].

## 2019-11-26 NOTE — Discharge Summary (Addendum)
ELECTROPHYSIOLOGY PROCEDURE DISCHARGE SUMMARY    Patient ID: [name],  MRN: [id number], DOB/AGE: 71-Aug-1952 71 y.o.  Admit date: 11/25/2019 Discharge date: 11/26/2019  Primary Care Physician: [name], MD  Primary Cardiologist: Dr. Martinique Electrophysiologist: Thompson Grayer, MD  Primary Discharge Diagnosis:  1. Persistent AFib     CHA2DS2Vasc is 4, on Eliquis, appropriately doses     Remote hx of AFlutter (ablated)  Secondary Discharge Diagnosis:  1. Morbid obesity 2. OSA      W/CPAP at home (declined here) 3. COPD 4. HTN  Procedures This Admission:  1.  Electrophysiology study and radiofrequency catheter ablation on 11/25/2019 by Dr Thompson Grayer.   This study demonstrated   CONCLUSIONS: 1. Atrial fibrillation upon presentation.   2. Intracardiac echo reveals a moderate sized left atrium with four separate pulmonary veins without evidence of pulmonary vein stenosis. 3. Successful electrical isolation and anatomical encircling of all four pulmonary veins with radiofrequency current.  A WACA approach was used 3. Additional left atrial ablation was performed with a Murphy box lesion created along the posterior wall of the left atrium 4. Atrial fibrillation successfully cardioverted to sinus rhythm. 5. No early apparent complications.    Brief HPI: [name] is a 69 y.o. female with a history of persistent atrial fibrillation.  She has failed medical therapy with Flecainide. Risks, benefits, and alternatives to catheter ablation of atrial fibrillation were reviewed with the patient who wished to proceed.  The patient underwent TEE prior to the procedure which demonstrated normal LV function and no LAA thrombus.    Hospital Course:  The patient was admitted and underwent EPS/RFCA of atrial fibrillation with details as outlined above.  She was monitored on telemetry overnight which demonstrated SB 50's overnight, SR 60's-70's otherwise.  Groin  was without complication on the day of discharge.  The patient was held overnight post procedure yesterday with persistent sleepiness post anesthesia.  This AM she is easy to wake, is AAO x3 and appropriate.  She feels well, no CP or SOB, no groin discomfort.  She decline CPAP last night.  On RA O2 sat is 94.   She was examined by Dr. Curt Bears and considered to be stable for discharge.  Wound care and restrictions were reviewed with the patient.  The patient [name] be seen back by in the AFib clinic in 4 weeks and Dr Rayann Heman in 12 weeks for post ablation follow up.    Physical Exam: Vitals:   11/25/19 1815 11/25/19 1946 11/25/19 2006 11/26/19 0523  BP: 124/69 110/70  110/64  Pulse: 74 83  66  Resp: 19 (!) 22  18  Temp:   98.1 F (36.7 C) 98.2 F (36.8 C)  TempSrc:   Oral Oral  SpO2: 99% 99%  98%  Weight:    122.5 kg  Height:        GEN- The patient is well appearing, alert and oriented x 3 today.   HEENT: normocephalic, atraumatic; sclera clear, conjunctiva pink; hearing intact; oropharynx clear; neck supple  Lungs- CTA b/l, normal work of breathing.  No wheezes, rales, rhonchi Heart- RRR, no murmurs, rubs or gallops  GI- soft, non-tender, non-distended, obese Extremities- no clubbing, cyanosis, or edema; DP/PT/radial pulses 2+ bilaterally, R groin without hematoma/bruit MS- no significant deformity or atrophy Skin- warm and dry, no rash or lesion Psych- euthymic mood, full affect Neuro- strength and sensation are intact   Labs:   Lab Results  Component Value Date  WBC 6.9 11/24/2019   HGB 10.2 (L) 11/24/2019   HCT 33.8 (L) 11/24/2019   MCV 93.6 11/24/2019   PLT 321 11/24/2019    Recent Labs  Lab 11/24/19 1316  NA 140  K 3.6  CL 102  CO2 28  BUN 21  CREATININE 1.42*  CALCIUM 9.4  GLUCOSE 155*     Discharge Medications:  Allergies as of 11/26/2019       Reactions   Shellfish Allergy Hives, Shortness Of Breath   Ace Inhibitors Cough   Aminophylline Nausea And  Vomiting   Heart races and pounds   Epinephrine Other (See Comments)   Pallpitations during dental procedures   Theophyllines Nausea And Vomiting, Other (See Comments)   Heart races and pounds   Propoxyphene Hcl Nausea And Vomiting        Medication List     TAKE these medications    albuterol (2.5 MG/3ML) 0.083% nebulizer solution Commonly known as: PROVENTIL Take 3 mLs (2.5 mg total) by nebulization every 4 (four) hours as needed for wheezing or shortness of breath (dx: J45.991). What changed: Another medication with the same name was changed. Make sure you understand how and when to take each.   ProAir RespiClick 123XX123 (90 Base) MCG/ACT Aepb Generic drug: Albuterol Sulfate INHALE 2 PUFFS BY MOUTH EVERY 4 HOURS AS NEEDED What changed: reasons to take this   ALPRAZolam 0.5 MG tablet Commonly known as: XANAX Take 0.5 mg by mouth 2 (two) times daily as needed for anxiety.   anastrozole 1 MG tablet Commonly known as: ARIMIDEX TAKE 1 TABLET BY MOUTH EVERY DAY(NEED APPOINTMENT FOR MORE REFILLS) What changed:  how much to take how to take this when to take this additional instructions   atorvastatin 10 MG tablet Commonly known as: LIPITOR Take 10 mg by mouth daily.   bisoprolol 10 MG tablet Commonly known as: ZEBETA Take 1 tablet (10 mg total) by mouth daily.   calcium-vitamin D 500-200 MG-UNIT tablet Commonly known as: OSCAL WITH D Take 1 tablet by mouth daily with breakfast.   celecoxib 200 MG capsule Commonly known as: CELEBREX Take 200 mg by mouth every 3 (three) days. In the morning   diltiazem 240 MG 24 hr capsule Commonly known as: Cartia XT Take 1 capsule (240 mg total) by mouth daily.   flecainide 100 MG tablet Commonly known as: TAMBOCOR TAKE 1 TABLET BY MOUTH TWICE DAILY What changed: Another medication with the same name was removed. Continue taking this medication, and follow the directions you see here.   fluticasone 50 MCG/ACT nasal  spray Commonly known as: FLONASE Place 2 sprays into both nostrils daily as needed for allergies or rhinitis.   furosemide 40 MG tablet Commonly known as: LASIX Take 40 mg by mouth daily.   hydrOXYzine 25 MG tablet Commonly known as: ATARAX/VISTARIL Take 25 mg by mouth every 6 (six) hours as needed for itching.   losartan 100 MG tablet Commonly known as: COZAAR Take 100 mg by mouth daily.   metFORMIN 500 MG tablet Commonly known as: GLUCOPHAGE Take 500 mg by mouth 2 (two) times daily with a meal.   nystatin powder Commonly known as: Nyamyc Apply topically 2 (two) times daily as needed. What changed:  how much to take reasons to take this   omeprazole 40 MG capsule Commonly known as: PRILOSEC take 1 capsule by mouth once daily BEFORE BREAKFAST What changed:  how much to take how to take this when to take this additional instructions  PARoxetine 20 MG tablet Commonly known as: PAXIL Take 20 mg by mouth daily.   potassium chloride 10 MEQ tablet Commonly known as: KLOR-CON Take 10 mEq by mouth daily.   Symbicort 160-4.5 MCG/ACT inhaler Generic drug: budesonide-formoterol INHALE 2 PUFFS INTO THE LUNGS TWICE DAILY What changed: when to take this   Vitamin D3 10 MCG (400 UNIT) Caps Take 400 Units by mouth daily.   vitamin E 1000 UNIT capsule Take 1,000 Units by mouth daily.   Xarelto 20 MG Tabs tablet Generic drug: rivaroxaban TAKE 1 TABLET BY MOUTH EVERY DAY WITH SUPPER What changed: See the new instructions.        Disposition: Home    Follow-up Information     [facility] Follow up.   Specialty: Cardiology Why: 12/22/3029 @ 8:30AM, with Shelley Greenland, NP Contact information: [address] [id number] [place] [phone]        Thompson Grayer, MD Follow up.   Specialty: Cardiology Why: 03/01/2020 @ 9:00AM Contact information: [place]  [id number] [phone]            Duration of Discharge Encounter: Greater than 30 minutes including physician time.  Venetia Night, PA-C 11/26/2019 7:53 AM   I have seen and examined this patient with [name].  Agree with above, note added to reflect my findings.  On exam, RRR, no murmurs.  Patient had AF ablation for persistent atrial fibrillation. Tolerated the procedure well without complaint this morning. Plan for discharge today with follow up in EP clinic.  Denali Sharma M. Tekeyah Santiago MD 11/26/2019 8:18 AM

## 2019-11-26 NOTE — Plan of Care (Signed)
  Problem: Education: Goal: Knowledge of General Education information will improve Description: Including pain rating scale, medication(s)/side effects and non-pharmacologic comfort measures Outcome: Progressing   Problem: Health Behavior/Discharge Planning: Goal: Ability to manage health-related needs will improve Outcome: Progressing   Problem: Clinical Measurements: Goal: Ability to maintain clinical measurements within normal limits will improve Outcome: Progressing Goal: Respiratory complications will improve Outcome: Progressing Goal: Cardiovascular complication will be avoided Outcome: Progressing   Problem: Activity: Goal: Risk for activity intolerance will decrease Outcome: Progressing   Problem: Nutrition: Goal: Adequate nutrition will be maintained Outcome: Progressing   Problem: Coping: Goal: Level of anxiety will decrease Outcome: Progressing   Problem: Elimination: Goal: Will not experience complications related to bowel motility Outcome: Progressing Goal: Will not experience complications related to urinary retention Outcome: Progressing   Problem: Pain Managment: Goal: General experience of comfort will improve Outcome: Progressing   Problem: Safety: Goal: Ability to remain free from injury will improve Outcome: Progressing

## 2019-11-26 NOTE — Telephone Encounter (Addendum)
**Note De-Identified Iverson Sees Obfuscation** I have e-scribed the pts Xareto 20 mg #90 with 1 refill to [facility] in [place] as requested.  I called the pt to make aware but got no answer and no way to leave a message as the pts mailbox is full.

## 2019-12-04 ENCOUNTER — Other Ambulatory Visit (HOSPITAL_COMMUNITY): Payer: Self-pay | Admitting: *Deleted

## 2019-12-04 DIAGNOSIS — C50212 Malignant neoplasm of upper-inner quadrant of left female breast: Secondary | ICD-10-CM

## 2019-12-04 DIAGNOSIS — M8580 Other specified disorders of bone density and structure, unspecified site: Secondary | ICD-10-CM

## 2019-12-05 ENCOUNTER — Inpatient Hospital Stay (HOSPITAL_BASED_OUTPATIENT_CLINIC_OR_DEPARTMENT_OTHER): Payer: Medicare Other | Admitting: Nurse Practitioner

## 2019-12-05 ENCOUNTER — Other Ambulatory Visit: Payer: Self-pay

## 2019-12-05 ENCOUNTER — Inpatient Hospital Stay (HOSPITAL_COMMUNITY): Payer: Medicare Other

## 2019-12-05 ENCOUNTER — Inpatient Hospital Stay (HOSPITAL_COMMUNITY): Payer: Medicare Other | Attending: Hematology

## 2019-12-05 DIAGNOSIS — C50212 Malignant neoplasm of upper-inner quadrant of left female breast: Secondary | ICD-10-CM

## 2019-12-05 DIAGNOSIS — M8580 Other specified disorders of bone density and structure, unspecified site: Secondary | ICD-10-CM | POA: Diagnosis not present

## 2019-12-05 DIAGNOSIS — Z170 Estrogen receptor positive status [ER+]: Secondary | ICD-10-CM | POA: Insufficient documentation

## 2019-12-05 DIAGNOSIS — E538 Deficiency of other specified B group vitamins: Secondary | ICD-10-CM | POA: Diagnosis not present

## 2019-12-05 DIAGNOSIS — Z7901 Long term (current) use of anticoagulants: Secondary | ICD-10-CM | POA: Insufficient documentation

## 2019-12-05 DIAGNOSIS — E559 Vitamin D deficiency, unspecified: Secondary | ICD-10-CM | POA: Insufficient documentation

## 2019-12-05 DIAGNOSIS — D509 Iron deficiency anemia, unspecified: Secondary | ICD-10-CM | POA: Diagnosis not present

## 2019-12-05 DIAGNOSIS — Z79899 Other long term (current) drug therapy: Secondary | ICD-10-CM

## 2019-12-05 DIAGNOSIS — I4892 Unspecified atrial flutter: Secondary | ICD-10-CM | POA: Diagnosis not present

## 2019-12-05 DIAGNOSIS — Z79811 Long term (current) use of aromatase inhibitors: Secondary | ICD-10-CM | POA: Diagnosis not present

## 2019-12-05 LAB — CBC WITH DIFFERENTIAL/PLATELET
Abs Immature Granulocytes: 0.02 10*3/uL (ref 0.00–0.07)
Basophils Absolute: 0.1 10*3/uL (ref 0.0–0.1)
Basophils Relative: 1 %
Eosinophils Absolute: 0.5 10*3/uL (ref 0.0–0.5)
Eosinophils Relative: 6 %
HCT: 30.3 % — ABNORMAL LOW (ref 36.0–46.0)
Hemoglobin: 9.3 g/dL — ABNORMAL LOW (ref 12.0–15.0)
Immature Granulocytes: 0 %
Lymphocytes Relative: 14 %
Lymphs Abs: 1 10*3/uL (ref 0.7–4.0)
MCH: 28.9 pg (ref 26.0–34.0)
MCHC: 30.7 g/dL (ref 30.0–36.0)
MCV: 94.1 fL (ref 80.0–100.0)
Monocytes Absolute: 0.5 10*3/uL (ref 0.1–1.0)
Monocytes Relative: 7 %
Neutro Abs: 5.2 10*3/uL (ref 1.7–7.7)
Neutrophils Relative %: 72 %
Platelets: 385 10*3/uL (ref 150–400)
RBC: 3.22 MIL/uL — ABNORMAL LOW (ref 3.87–5.11)
RDW: 15.8 % — ABNORMAL HIGH (ref 11.5–15.5)
WBC: 7.3 10*3/uL (ref 4.0–10.5)
nRBC: 0 % (ref 0.0–0.2)

## 2019-12-05 LAB — COMPREHENSIVE METABOLIC PANEL
ALT: 12 U/L (ref 0–44)
AST: 11 U/L — ABNORMAL LOW (ref 15–41)
Albumin: 3.8 g/dL (ref 3.5–5.0)
Alkaline Phosphatase: 60 U/L (ref 38–126)
Anion gap: 11 (ref 5–15)
BUN: 23 mg/dL (ref 8–23)
CO2: 27 mmol/L (ref 22–32)
Calcium: 8.9 mg/dL (ref 8.9–10.3)
Chloride: 102 mmol/L (ref 98–111)
Creatinine, Ser: 1.72 mg/dL — ABNORMAL HIGH (ref 0.44–1.00)
GFR calc Af Amer: 34 mL/min — ABNORMAL LOW (ref >60)
GFR calc non Af Amer: 29 mL/min — ABNORMAL LOW (ref >60)
Glucose, Bld: 109 mg/dL — ABNORMAL HIGH (ref 70–99)
Potassium: 3.5 mmol/L (ref 3.5–5.1)
Sodium: 140 mmol/L (ref 135–145)
Total Bilirubin: 0.7 mg/dL (ref 0.3–1.2)
Total Protein: 7.6 g/dL (ref 6.5–8.1)

## 2019-12-05 LAB — IRON AND TIBC
Iron: 47 ug/dL (ref 28–170)
Saturation Ratios: 14 % (ref 10.4–31.8)
TIBC: 348 ug/dL (ref 250–450)
UIBC: 301 ug/dL

## 2019-12-05 LAB — VITAMIN D 25 HYDROXY (VIT D DEFICIENCY, FRACTURES): Vit D, 25-Hydroxy: 37.77 ng/mL (ref 30–100)

## 2019-12-05 LAB — VITAMIN B12: Vitamin B-12: 721 pg/mL (ref 180–914)

## 2019-12-05 LAB — LACTATE DEHYDROGENASE: LDH: 127 U/L (ref 98–192)

## 2019-12-05 LAB — FERRITIN: Ferritin: 194 ng/mL (ref 11–307)

## 2019-12-05 MED ORDER — DENOSUMAB 60 MG/ML ~~LOC~~ SOSY
60.0000 mg | PREFILLED_SYRINGE | Freq: Once | SUBCUTANEOUS | Status: AC
  Administered 2019-12-05: 60 mg via SUBCUTANEOUS
  Filled 2019-12-05: qty 1

## 2019-12-05 MED ORDER — DENOSUMAB 60 MG/ML ~~LOC~~ SOLN: 60.0000 mg | Freq: Once | SUBCUTANEOUS | Status: DC

## 2019-12-05 NOTE — Progress Notes (Signed)
[facility] [place], [place] [address]   CLINIC:  Medical Oncology/Hematology  PCP:  Shelley Chroman, MD [address] [place] [address] [phone]   REASON FOR VISIT: Follow-up for breast cancer and iron deficiency anemia  CURRENT THERAPY: Anastrozole and intermittent iron infusions  BRIEF ONCOLOGIC HISTORY:  Oncology History  Breast cancer of upper-inner quadrant of left female breast ([place])  12/15/2014 Mammogram   new 73m round nodule in left breast is indeterminate   12/17/2014 Imaging   6 mm lobulated nodule in the left breast is at low suspicion for malignancy. ultrasound guided biopsy recommended   12/23/2014 Procedure   [facility] In[facility] carcinoma.  ER+ (100%), PR+ (99%), HER2 neg (ratio 1.61). Ki67 45%.    01/26/2015 Surgery   (L) lumpectomy with SLNB ([name]: IDC, grade 2, spanning 1.1 cm; also low-grade DCIS; margins negative. 0/3 left axillary SLN. HER2 repeated and remains neg. (ratio 1.33).  pT1c, pN0: Stage IA   03/19/2015 Genetic Testing   Genetic testing: Negative. Genes analyzed: ATM, BARD1, BRCA1, BRCA2, BRIP1, CDH1, CHEK2, FANCC, MLH1, MSH2, MSH6, NBN, PALB2, PMS2, PTEN, RAD51C, RAD51D, STK11, TP53, and XRCC2.  Additionally, this panel includes deletion/duplication analysis (without next-generation sequencing) of one gene, EPCAM.   03/2015 - 04/2015 Radiation Therapy   Adjuvant breast radiation with [name]    04/30/2015 Imaging   DEXA scan: Osteopenia. (T-Score of -1.1)   05/2015 -  Anti-estrogen oral therapy   Anastrozole. Planned duration of therapy 5-10 years.    07/16/2015 Miscellaneous   Prolia injections started.       INTERVAL HISTORY:  [name]89y.o. female returns for routine follow-up for breast cancer and iron deficiency anemia.  Patient reports she has been doing well since her last visit she denies any new lumps or bumps present.  She denies any bright red bleeding  per rectum or melena.  She denies any easy bruising or bleeding.  She denies any extreme fatigue.  She denies any new bone Murphy.  She does report occasional diarrhea.  Denies any nausea, vomiting, or constipation. Denies any new pains. Had not noticed any recent bleeding such as epistaxis, hematuria or hematochezia. Denies recent chest Murphy on exertion, shortness of breath on minimal exertion, pre-syncopal episodes, or palpitations. Denies any numbness or tingling in hands or feet. Denies any recent fevers, infections, or recent hospitalizations. Patient reports appetite at 50% and energy level at 25%.  She is eating well enough to maintain her weight.     REVIEW OF SYSTEMS:  Review of Systems  Gastrointestinal: Positive for diarrhea.  All other systems reviewed and are negative.    PAST MEDICAL/SURGICAL HISTORY:  Past Medical History:  Diagnosis Date  . Adenomatous colon polyp 1993  . Allergy   . Anemia   . Anxiety disorder   . Asthma    Spirometry 1989: FEV1 2.12 (71%) with ratio 82.  HFA 90% after coaching 11-09-10  . Atrial flutter ([place] of 3 (11/19/14)   Status post cardioversion 2-12 2013. s/p RFCA 5/13 with [name] (([facility])  . Breast cancer ([place]    left  . COPD (chronic obstructive pulmonary disease) ([place]   . DDD (degenerative disc disease)   . Diabetes mellitus without complication ([place]   . Diverticulosis   . Essential hypertension, benign   . Fibromyalgia   . GERD (gastroesophageal reflux disease)    EGD with HH / esophagitis 06-30-93  . Hiatal  hernia   . Hyperlipidemia   . Internal and external hemorrhoids without complication   . Lipoma of colon   . Migraine   . Osteoarthritis   . Pelvic fracture ([place])   . Persistent atrial fibrillation ([place])   . Personal history of colonic polyps 06/14/2010  . Pneumonia   . PONV (postoperative nausea and vomiting)   . Radiation    left breast cancer  . Sleep apnea    cpap   Past Surgical History:  Procedure  Laterality Date  . A FLUTTER ABLATION  02/22/2012  . ATRIAL FIBRILLATION ABLATION N/A 11/25/2019   Procedure: ATRIAL FIBRILLATION ABLATION;  Surgeon: Shelley Grayer, MD;  Location: [facility];  Service: Cardiovascular;  Laterality: N/A;  . ATRIAL FLUTTER ABLATION N/A 02/22/2012   Procedure: ATRIAL FLUTTER ABLATION;  Surgeon: Shelley Grayer, MD;  Location: [facility] CATH LAB;  Service: Cardiovascular;  Laterality: N/A;  . BREAST BIOPSY Left 11/2014  . CARDIOVERSION N/A 02/19/2014   Procedure: CARDIOVERSION;  Surgeon: Shelley Spark, MD;  Location: [facility];  Service: Cardiovascular;  Laterality: N/A;  . CARDIOVERSION N/A 07/22/2019   Procedure: CARDIOVERSION;  Surgeon: Shelley Hector, MD;  Location: [facility] ENDOSCOPY;  Service: Cardiovascular;  Laterality: N/A;  . COLONOSCOPY  05/2010, 07/2000  . DILATION AND CURETTAGE OF UTERUS    . ESOPHAGOGASTRODUODENOSCOPY  05/2010, 06/1993  . JOINT REPLACEMENT    . POLYPECTOMY    . RADIOACTIVE SEED GUIDED PARTIAL MASTECTOMY WITH AXILLARY SENTINEL LYMPH NODE BIOPSY Left 01/26/2015   Procedure: LEFT  PARTIAL MASTECTOMY WITH  RADIOACTIVE SEED LOCALIZATION, LEFT AXILLARY SENTINEL LYMPH NODE BIOPSY;  Surgeon: Shelley Skates, MD;  Location: [facility];  Service: General;  Laterality: Left;  . ROTATOR CUFF REPAIR     bilateral  . ROTATOR CUFF REPAIR     bil  . TEE WITHOUT CARDIOVERSION N/A 11/24/2019   Procedure: TRANSESOPHAGEAL ECHOCARDIOGRAM (TEE);  Surgeon: Shelley Pain, MD;  Location: [facility] ENDOSCOPY;  Service: Cardiovascular;  Laterality: N/A;  . TOTAL KNEE ARTHROPLASTY     x2 09/2006  . UPPER GASTROINTESTINAL ENDOSCOPY       SOCIAL HISTORY:  Social History   Socioeconomic History  . Marital status: Widowed    Spouse name: Not on file  . Number of children: 0  . Years of education: Not on file  . Highest education level: Not on file  Occupational History  . Occupation: Database administrator at [employer]  Tobacco Use  . Smoking status: Former Smoker     Packs/day: 1.00    Years: 20.00    Pack years: 20.00    Types: Cigarettes    Quit date: 09/25/1984    Years since quitting: 35.2  . Smokeless tobacco: Never Used  Substance and Sexual Activity  . Alcohol use: No    Alcohol/week: 0.0 standard drinks  . Drug use: No  . Sexual activity: Not Currently  Other Topics Concern  . Not on file  Social History Narrative  . Not on file   Social Determinants of Health   Financial Resource Strain:   . Difficulty of Paying Living Expenses:   Food Insecurity:   . Worried About Charity fundraiser in the Last Year:   . Arboriculturist in the Last Year:   Transportation Needs:   . Film/video editor (Medical):   Marland Kitchen Lack of Transportation (Non-Medical):   Physical Activity:   . Days of Exercise per Week:   . Minutes of Exercise per Session:  Stress:   . Feeling of Stress :   Social Connections:   . Frequency of Communication with Friends and Family:   . Frequency of Social Gatherings with Friends and Family:   . Attends Religious Services:   . Active Member of Clubs or Organizations:   . Attends Archivist Meetings:   Marland Kitchen Marital Status:   Intimate Partner Violence:   . Fear of Current or Ex-Partner:   . Emotionally Abused:   Marland Kitchen Physically Abused:   . Sexually Abused:     FAMILY HISTORY:  Family History  Problem Relation Age of Onset  . Emphysema Father        smoker  . Heart disease Father   . Breast cancer Mother 56  . Breast cancer Sister 2  . Heart Problems Sister   . Heart disease Sister   . Breast cancer Maternal Aunt 48  . Lung cancer Maternal Uncle 64       smoker  . Colon cancer Paternal Uncle 47  . Diabetes Maternal Grandmother   . Heart Problems Maternal Grandfather   . Stroke Paternal Grandfather   . Lung cancer Maternal Uncle 72       smoker  . Heart Problems Paternal Uncle   . Diabetes Paternal Grandmother   . Rectal cancer Neg Hx   . Stomach cancer Neg Hx   . Esophageal cancer Neg Hx      CURRENT MEDICATIONS:  Outpatient Encounter Medications as of 12/05/2019  Medication Sig  . anastrozole (ARIMIDEX) 1 MG tablet TAKE 1 TABLET BY MOUTH EVERY DAY(NEED APPOINTMENT FOR MORE REFILLS) (Patient taking differently: Take 1 mg by mouth daily. )  . atorvastatin (LIPITOR) 10 MG tablet Take 10 mg by mouth daily.  . bisoprolol (ZEBETA) 10 MG tablet Take 1 tablet (10 mg total) by mouth daily.  Marland Kitchen BREO ELLIPTA 100-25 MCG/INH AEPB Inhale 1 puff into the lungs daily.  . calcium-vitamin D (OSCAL WITH D) 500-200 MG-UNIT per tablet Take 1 tablet by mouth daily with breakfast.  . cefUROXime (CEFTIN) 500 MG tablet Take 500 mg by mouth 2 (two) times daily.  . celecoxib (CELEBREX) 200 MG capsule Take 200 mg by mouth every 3 (three) days. In the morning  . Cholecalciferol (VITAMIN D3) 10 MCG (400 UNIT) CAPS Take 400 Units by mouth daily.  Marland Kitchen diltiazem (CARTIA XT) 240 MG 24 hr capsule Take 1 capsule (240 mg total) by mouth daily.  . flecainide (TAMBOCOR) 100 MG tablet TAKE 1 TABLET BY MOUTH TWICE DAILY  . furosemide (LASIX) 40 MG tablet Take 40 mg by mouth daily.   Marland Kitchen losartan (COZAAR) 100 MG tablet Take 100 mg by mouth daily.   . metFORMIN (GLUCOPHAGE) 500 MG tablet Take 500 mg by mouth 2 (two) times daily with a meal.   . nystatin (NYAMYC) powder Apply topically 2 (two) times daily as needed. (Patient taking differently: Apply 1 application topically 2 (two) times daily as needed (skin irritation). )  . omeprazole (PRILOSEC) 40 MG capsule take 1 capsule by mouth once daily BEFORE BREAKFAST (Patient taking differently: Take 40 mg by mouth daily before breakfast. )  . PARoxetine (PAXIL) 20 MG tablet Take 20 mg by mouth daily.  . potassium chloride (K-DUR) 10 MEQ tablet Take 10 mEq by mouth daily.   . predniSONE (DELTASONE) 10 MG tablet Take 10 mg by mouth 2 (two) times daily.  Marland Kitchen PROAIR RESPICLICK 967 (90 Base) MCG/ACT AEPB INHALE 2 PUFFS BY MOUTH EVERY 4 HOURS AS NEEDED (Patient  taking differently:  Inhale 2 puffs into the lungs every 4 (four) hours as needed (shortness of breath or wheezing). )  . rivaroxaban (XARELTO) 20 MG TABS tablet TAKE 1 TABLET BY MOUTH EVERY DAY WITH SUPPER  . SYMBICORT 160-4.5 MCG/ACT inhaler INHALE 2 PUFFS INTO THE LUNGS TWICE DAILY (Patient taking differently: Inhale 2 puffs into the lungs in the morning and at bedtime. )  . vitamin E 1000 UNIT capsule Take 1,000 Units by mouth daily.  Marland Kitchen albuterol (PROVENTIL) (2.5 MG/3ML) 0.083% nebulizer solution Take 3 mLs (2.5 mg total) by nebulization every 4 (four) hours as needed for wheezing or shortness of breath (dx: J45.991). (Patient not taking: Reported on 12/05/2019)  . ALPRAZolam (XANAX) 0.5 MG tablet Take 0.5 mg by mouth 2 (two) times daily as needed for anxiety.   . fluticasone (FLONASE) 50 MCG/ACT nasal spray Place 2 sprays into both nostrils daily as needed for allergies or rhinitis.  . hydrOXYzine (ATARAX/VISTARIL) 25 MG tablet Take 25 mg by mouth every 6 (six) hours as needed for itching.    Facility-Administered Encounter Medications as of 12/05/2019  Medication  . 0.9 %  sodium chloride infusion    ALLERGIES:  Allergies  Allergen Reactions  . Shellfish Allergy Hives and Shortness Of Breath  . Ace Inhibitors Cough  . Aminophylline Nausea And Vomiting    Heart races and pounds  . Epinephrine Other (See Comments)    Pallpitations during dental procedures  . Theophyllines Nausea And Vomiting and Other (See Comments)    Heart races and pounds  . Propoxyphene Hcl Nausea And Vomiting     PHYSICAL EXAM:  ECOG Performance status: 2  Vitals:   12/05/19 1111  BP: (!) 146/51  Pulse: 66  Resp: 18  Temp: (!) 97.1 F (36.2 C)  SpO2: 91%   Filed Weights   12/05/19 1111  Weight: 252 lb (114.3 kg)    Physical Exam Constitutional:      Appearance: She is obese.  Cardiovascular:     Rate and Rhythm: Normal rate and regular rhythm.     Heart sounds: Normal heart sounds.  Pulmonary:     Effort:  Pulmonary effort is normal.     Breath sounds: Normal breath sounds.  Abdominal:     General: Bowel sounds are normal.     Palpations: Abdomen is soft.  Musculoskeletal:        General: Normal range of motion.  Skin:    General: Skin is warm.  Neurological:     Mental Status: She is alert and oriented to person, place, and time. Mental status is at baseline.  Psychiatric:        Mood and Affect: Mood normal.        Behavior: Behavior normal.        Thought Content: Thought content normal.        Judgment: Judgment normal.      LABORATORY DATA:  I have reviewed the labs as listed.  CBC    Component Value Date/Time   WBC 7.3 12/05/2019 1025   RBC 3.22 (L) 12/05/2019 1025   HGB 9.3 (L) 12/05/2019 1025   HCT 30.3 (L) 12/05/2019 1025   PLT 385 12/05/2019 1025   MCV 94.1 12/05/2019 1025   MCH 28.9 12/05/2019 1025   MCHC 30.7 12/05/2019 1025   RDW 15.8 (H) 12/05/2019 1025   LYMPHSABS 1.0 12/05/2019 1025   MONOABS 0.5 12/05/2019 1025   EOSABS 0.5 12/05/2019 1025   BASOSABS 0.1 12/05/2019 1025  CMP Latest Ref Rng & Units 12/05/2019 11/24/2019 11/20/2019  Glucose 70 - 99 mg/dL 109(H) 155(H) -  BUN 8 - 23 mg/dL 23 21 -  Creatinine 0.44 - 1.00 mg/dL 1.72(H) 1.42(H) 1.40(H)  Sodium 135 - 145 mmol/L 140 140 -  Potassium 3.5 - 5.1 mmol/L 3.5 3.6 -  Chloride 98 - 111 mmol/L 102 102 -  CO2 22 - 32 mmol/L 27 28 -  Calcium 8.9 - 10.3 mg/dL 8.9 9.4 -  Total Protein 6.5 - 8.1 g/dL 7.6 - -  Total Bilirubin 0.3 - 1.2 mg/dL 0.7 - -  Alkaline Phos 38 - 126 U/L 60 - -  AST 15 - 41 U/L 11(L) - -  ALT 0 - 44 U/L 12 - -    I personally performed a face-to-face visit.  All questions were answered to patient's stated satisfaction. Encouraged patient to call with any new concerns or questions before his next visit to the cancer center and we can certain see him sooner, if needed.     ASSESSMENT & PLAN:   Breast cancer of upper-inner quadrant of left female breast 1.  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 - She was treated with a left breast lumpectomy by Dr. Dalbert Murphy. - Followed by adjuvant radiation therapy with Dr. Pablo Murphy.  Completed radiation 04/2015. - She started antiestrogen therapy with anastrozole in 05/2015.  The plan is to continue this therapy for 10 years. - Her Oncotype DX was low risk score.  Patient was to continue therapy for 10 years. - Her mammogram will be done 02/2019 which was BI-RADS Category 0 incomplete.  Ultrasound was done which showed scar tissue.  She will repeat in 1 year with mammogram. -She will schedule her mammogram to be done with [facility] in June 2021. - We will continue to follow her every 6 months with labs.   2.  Osteopenia: - Her last DEXA scan done on 04/2018 showed osteopenia, with a T score of -1.4 -She is receiving Prolia every 6 months.  -She received her injection today 12/05/2019 -She is continuing to take calcium and vitamin D. - She will have a repeat DEXA scan in 04/2020  3.  Anemia: -Likely due from CKD. -Last Feraheme infusion was on 05/30/2019 and 06/19/2019 -Labs done on 12/05/2019 showed hemoglobin 9.3, ferritin 194, percent saturation 14, platelets 385, WBC 7.3 -She will get 2 infusions of IV iron. - She will return to the clinic in 3 months for follow-up labs.  5.  B12 deficiency: - Labs on 01/24/2019 showed her B12 at 185. -Labs on 05/20/2019 showed her B12 level came up to 1376. - She is supposed to be given herself monthly B12 injections however she forgets to do this often. - I have started her on oral B12 daily to see if that helps since she forgets the injections and does not like doing them. -Labs done on 12/05/2019 showed B12 at 721 we will continue with the oral B12 daily. -We will follow-up with labs in 3 months.  6.  Vitamin D deficiency: - Labs on 01/24/2019 showed her vitamin D level 29.5. -She takes  vitamin D daily. - I have increased her dose to twice a day. -Labs done on 12/05/2019 shows vitamin D level pending. - We will follow-up in 3 months with labs  7.  Atrial flutter: -Patient is on Xarelto. -Patient follows up with PCP or cardiology as  recommended.      Orders placed this encounter:  Orders Placed This Encounter  Procedures  . Lactate dehydrogenase  . CBC with Differential/Platelet  . Comprehensive metabolic panel  . Ferritin  . Iron and TIBC  . Vitamin B12  . VITAMIN D 25 Hydroxy (Vit-D Deficiency, Fractures)  . Folate      Francene Finders, FNP-C [place] [phone]

## 2019-12-05 NOTE — Assessment & Plan Note (Addendum)
1.  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 - She was treated with a left breast lumpectomy by Dr. Dalbert Batman. - Followed by adjuvant radiation therapy with Dr. Pablo Ledger.  Completed radiation 04/2015. - She started antiestrogen therapy with anastrozole in 05/2015.  The plan is to continue this therapy for 10 years. - Her Oncotype DX was low risk score.  Patient was to continue therapy for 10 years. - Her mammogram will be done 02/2019 which was BI-RADS Category 0 incomplete.  Ultrasound was done which showed scar tissue.  She will repeat in 1 year with mammogram. -She will schedule her mammogram to be done with [facility] in June 2021. - We will continue to follow her every 6 months with labs.   2.  Osteopenia: - Her last DEXA scan done on 04/2018 showed osteopenia, with a T score of -1.4 -She is receiving Prolia every 6 months.  -She received her injection today 12/05/2019 -She is continuing to take calcium and vitamin D. - She will have a repeat DEXA scan in 04/2020  3.  Anemia: -Likely due from CKD. -Last Feraheme infusion was on 05/30/2019 and 06/19/2019 -Labs done on 12/05/2019 showed hemoglobin 9.3, ferritin 194, percent saturation 14, platelets 385, WBC 7.3 -She will get 2 infusions of IV iron. - She will return to the clinic in 3 months for follow-up labs.  5.  B12 deficiency: - Labs on 01/24/2019 showed her B12 at 185. -Labs on 05/20/2019 showed her B12 level came up to 1376. - She is supposed to be given herself monthly B12 injections however she forgets to do this often. - I have started her on oral B12 daily to see if that helps since she forgets the injections and does not like doing them. -Labs done on 12/05/2019 showed B12 at 721 we will continue with the oral B12 daily. -We will follow-up with labs in 3 months.  6.  Vitamin D deficiency: - Labs on 01/24/2019 showed her vitamin D  level 29.5. -She takes vitamin D daily. - I have increased her dose to twice a day. -Labs done on 12/05/2019 shows vitamin D level pending. - We will follow-up in 3 months with labs  7.  Atrial flutter: -Patient is on Xarelto. -Patient follows up with PCP or cardiology as recommended.

## 2019-12-05 NOTE — Patient Instructions (Signed)
[facility] at [facility] Discharge Instructions  Follow up in 3 months with lab s   Thank you for choosing [facility] at [facility] to provide your oncology and hematology care.  To afford each patient quality time with our provider, please arrive at least 15 minutes before your scheduled appointment time.   If you have a lab appointment with the Cancer Center please come in thru the Main Entrance and check in at the main information desk.  You need to re-schedule your appointment should you arrive 10 or more minutes late.  We strive to give you quality time with our providers, and arriving late affects you and other patients whose appointments are after yours.  Also, if you no show three or more times for appointments you may be dismissed from the clinic at the providers discretion.     Again, thank you for choosing [facility].  Our hope is that these requests will decrease the amount of time that you wait before being seen by our physicians.       _____________________________________________________________  Should you have questions after your visit to  Cancer Center, please contact our office at [phone] between the hours of 8:00 a.m. and 4:30 p.m.  Voicemails left after 4:00 p.m. will not be returned until the following business day.  For prescription refill requests, have your pharmacy contact our office and allow 72 hours.    Due to Covid, you will need to wear a mask upon entering the hospital. If you do not have a mask, a mask will be given to you at the Main Entrance upon arrival. For doctor visits, patients may have 1 support person with them. For treatment visits, patients can not have anyone with them due to social distancing guidelines and our immunocompromised population.      

## 2019-12-05 NOTE — Progress Notes (Signed)
Patient may have prolia shot verbal order Francene Finders, NP.  Patient tolerated injection with no complaints voiced.  Site clean and dry with no bruising or swelling noted at site.  Band aid applied.  Vss with discharge and left by wheelchair with no s/s of distress noted.

## 2019-12-11 ENCOUNTER — Other Ambulatory Visit (HOSPITAL_COMMUNITY): Payer: Self-pay | Admitting: Nurse Practitioner

## 2019-12-11 DIAGNOSIS — C50212 Malignant neoplasm of upper-inner quadrant of left female breast: Secondary | ICD-10-CM

## 2019-12-11 DIAGNOSIS — Z170 Estrogen receptor positive status [ER+]: Secondary | ICD-10-CM

## 2019-12-12 ENCOUNTER — Ambulatory Visit (HOSPITAL_COMMUNITY): Payer: Medicare Other

## 2019-12-19 ENCOUNTER — Ambulatory Visit (HOSPITAL_COMMUNITY): Payer: Medicare Other

## 2019-12-23 ENCOUNTER — Ambulatory Visit (HOSPITAL_COMMUNITY)
Admission: RE | Admit: 2019-12-23 | Discharge: 2019-12-23 | Disposition: A | Discharge status: Home / Self Care | Payer: Medicare Other | Source: Ambulatory Visit | Attending: Nurse Practitioner | Admitting: Nurse Practitioner

## 2019-12-23 ENCOUNTER — Encounter (HOSPITAL_COMMUNITY): Payer: Self-pay | Admitting: Nurse Practitioner

## 2019-12-23 ENCOUNTER — Other Ambulatory Visit: Payer: Self-pay

## 2019-12-23 VITALS — BP 128/70 | HR 45 | Ht 66.0 in | Wt 262.8 lb

## 2019-12-23 DIAGNOSIS — I4819 Other persistent atrial fibrillation: Secondary | ICD-10-CM | POA: Diagnosis not present

## 2019-12-23 DIAGNOSIS — K219 Gastro-esophageal reflux disease without esophagitis: Secondary | ICD-10-CM | POA: Diagnosis not present

## 2019-12-23 DIAGNOSIS — Z888 Allergy status to other drugs, medicaments and biological substances status: Secondary | ICD-10-CM | POA: Insufficient documentation

## 2019-12-23 DIAGNOSIS — Z923 Personal history of irradiation: Secondary | ICD-10-CM | POA: Insufficient documentation

## 2019-12-23 DIAGNOSIS — Z803 Family history of malignant neoplasm of breast: Secondary | ICD-10-CM | POA: Diagnosis not present

## 2019-12-23 DIAGNOSIS — I4892 Unspecified atrial flutter: Secondary | ICD-10-CM | POA: Insufficient documentation

## 2019-12-23 DIAGNOSIS — Z7901 Long term (current) use of anticoagulants: Secondary | ICD-10-CM | POA: Insufficient documentation

## 2019-12-23 DIAGNOSIS — Z7984 Long term (current) use of oral hypoglycemic drugs: Secondary | ICD-10-CM | POA: Diagnosis not present

## 2019-12-23 DIAGNOSIS — F419 Anxiety disorder, unspecified: Secondary | ICD-10-CM | POA: Insufficient documentation

## 2019-12-23 DIAGNOSIS — Z8249 Family history of ischemic heart disease and other diseases of the circulatory system: Secondary | ICD-10-CM | POA: Insufficient documentation

## 2019-12-23 DIAGNOSIS — Z79899 Other long term (current) drug therapy: Secondary | ICD-10-CM | POA: Insufficient documentation

## 2019-12-23 DIAGNOSIS — D6869 Other thrombophilia: Secondary | ICD-10-CM | POA: Diagnosis not present

## 2019-12-23 DIAGNOSIS — M797 Fibromyalgia: Secondary | ICD-10-CM | POA: Diagnosis not present

## 2019-12-23 DIAGNOSIS — Z86010 Personal history of colonic polyps: Secondary | ICD-10-CM | POA: Insufficient documentation

## 2019-12-23 DIAGNOSIS — Z91013 Allergy to seafood: Secondary | ICD-10-CM | POA: Diagnosis not present

## 2019-12-23 DIAGNOSIS — E119 Type 2 diabetes mellitus without complications: Secondary | ICD-10-CM | POA: Diagnosis not present

## 2019-12-23 DIAGNOSIS — I10 Essential (primary) hypertension: Secondary | ICD-10-CM | POA: Diagnosis not present

## 2019-12-23 DIAGNOSIS — G4733 Obstructive sleep apnea (adult) (pediatric): Secondary | ICD-10-CM | POA: Diagnosis not present

## 2019-12-23 DIAGNOSIS — J449 Chronic obstructive pulmonary disease, unspecified: Secondary | ICD-10-CM | POA: Insufficient documentation

## 2019-12-23 DIAGNOSIS — Z79811 Long term (current) use of aromatase inhibitors: Secondary | ICD-10-CM | POA: Diagnosis not present

## 2019-12-23 DIAGNOSIS — Z853 Personal history of malignant neoplasm of breast: Secondary | ICD-10-CM | POA: Insufficient documentation

## 2019-12-23 DIAGNOSIS — Z7951 Long term (current) use of inhaled steroids: Secondary | ICD-10-CM | POA: Diagnosis not present

## 2019-12-23 DIAGNOSIS — Z6841 Body Mass Index (BMI) 40.0 and over, adult: Secondary | ICD-10-CM | POA: Diagnosis not present

## 2019-12-23 DIAGNOSIS — E785 Hyperlipidemia, unspecified: Secondary | ICD-10-CM | POA: Diagnosis not present

## 2019-12-23 DIAGNOSIS — M1990 Unspecified osteoarthritis, unspecified site: Secondary | ICD-10-CM | POA: Insufficient documentation

## 2019-12-23 DIAGNOSIS — Z825 Family history of asthma and other chronic lower respiratory diseases: Secondary | ICD-10-CM | POA: Diagnosis not present

## 2019-12-23 DIAGNOSIS — Z87891 Personal history of nicotine dependence: Secondary | ICD-10-CM | POA: Diagnosis not present

## 2019-12-23 MED ORDER — DILTIAZEM HCL ER COATED BEADS 120 MG PO CP24: 120.0000 mg | ORAL_CAPSULE | Freq: Every day | ORAL | 11 refills | Status: AC

## 2019-12-23 MED ORDER — BISOPROLOL FUMARATE 10 MG PO TABS: 5.0000 mg | ORAL_TABLET | Freq: Every day | ORAL | 3 refills | Status: AC

## 2019-12-23 NOTE — Progress Notes (Addendum)
Primary Care Physician: [name], MD Referring Physician: Dr. Rayann Heman Cardiologist: Dr. Martinique   [name] is a 69 y.o. female with a h/o morbid obesity, persistent afib that is in the afib clinic for f/u. She  Is found to be  in afib this am. Pt was unaware. She states that she missed a week of flecainide 2/2 refill issues and has been back on it x 2 days. She has also been taking a triple supplement consisting of garcinia/cambogia/chromium,for weight loss.  all of which can contribute to heart irregularity. She has been taking x 2 months and has lost 12 lbs. No issues with  xarelto 20 mg qd, although her H/H has been running lower over the last few months and is getting intermittent RBC infusions at the [facility].. She does not report any breakthrough afib, no bleeding issues. She continues on cpap.    Return for EKG 9/18. She is in atrial flutter. She cannot tell if she is persistent or not. She increased her flecainide to 100 mg bid one week ago  with normal appearing intervals. She did miss one dose of xarelto 9/10. She has a Investment banker, operational and most strips show possible afib but there is so much artifact on the strips that it is hard to say underlying rhythm.   F/u in afib clinic, 12/24/19. Pt is one month s/p  afib ablation. First ablation in 2013. She failed flecainide and cardioversion shortly before ablation. She is in slow SR today, asymptomatic. She has started taking her diltiazem 240 mg every other day. Her main complaint is that of fatigue.  No swallowing or groin issues. She states that she has missed her iron infusions and that may be contributing to fatigue as well.Continues on xarelto 20 mg daily for a CHA2DS2VASc score of 4.  Today, she denies symptoms of palpitations, chest pain, shortness of breath, orthopnea, PND, lower extremity edema, dizziness, presyncope, syncope, or neurologic sequela. The patient is tolerating medications without difficulties and is otherwise  without complaint today.   Past Medical History:  Diagnosis Date  . Adenomatous colon polyp 1993  . Allergy   . Anemia   . Anxiety disorder   . Asthma    Spirometry 1989: FEV1 2.12 (71%) with ratio 82.  HFA 90% after coaching 11-09-10  . Atrial flutter ([place]) chadsvasc of 3 (11/19/14)   Status post cardioversion 2-12 2013. s/p RFCA 5/13 with JAllred ((CTI)  . Breast cancer ([place])    left  . COPD (chronic obstructive pulmonary disease) ([place])   . DDD (degenerative disc disease)   . Diabetes mellitus without complication ([place])   . Diverticulosis   . Essential hypertension, benign   . Fibromyalgia   . GERD (gastroesophageal reflux disease)    EGD with HH / esophagitis 06-30-93  . Hiatal hernia   . Hyperlipidemia   . Internal and external hemorrhoids without complication   . Lipoma of colon   . Migraine   . Osteoarthritis   . Pelvic fracture ([place])   . Persistent atrial fibrillation ([place])   . Personal history of colonic polyps 06/14/2010  . Pneumonia   . PONV (postoperative nausea and vomiting)   . Radiation    left breast cancer  . Sleep apnea    cpap   Past Surgical History:  Procedure Laterality Date  . A FLUTTER ABLATION  02/22/2012  . ATRIAL FIBRILLATION ABLATION N/A 11/25/2019   Procedure: ATRIAL FIBRILLATION ABLATION;  Surgeon: Thompson Grayer, MD;  Location: [place]  CV LAB;  Service: Cardiovascular;  Laterality: N/A;  . ATRIAL FLUTTER ABLATION N/A 02/22/2012   Procedure: ATRIAL FLUTTER ABLATION;  Surgeon: Thompson Grayer, MD;  Location: [facility] CATH LAB;  Service: Cardiovascular;  Laterality: N/A;  . BREAST BIOPSY Left 11/2014  . CARDIOVERSION N/A 02/19/2014   Procedure: CARDIOVERSION;  Surgeon: Dorothy Spark, MD;  Location: [place];  Service: Cardiovascular;  Laterality: N/A;  . CARDIOVERSION N/A 07/22/2019   Procedure: CARDIOVERSION;  Surgeon: Josue Hector, MD;  Location: [facility] ENDOSCOPY;  Service: Cardiovascular;  Laterality: N/A;  . COLONOSCOPY  05/2010, 07/2000  . DILATION  AND CURETTAGE OF UTERUS    . ESOPHAGOGASTRODUODENOSCOPY  05/2010, 06/1993  . JOINT REPLACEMENT    . POLYPECTOMY    . RADIOACTIVE SEED GUIDED PARTIAL MASTECTOMY WITH AXILLARY SENTINEL LYMPH NODE BIOPSY Left 01/26/2015   Procedure: LEFT  PARTIAL MASTECTOMY WITH  RADIOACTIVE SEED LOCALIZATION, LEFT AXILLARY SENTINEL LYMPH NODE BIOPSY;  Surgeon: Fanny Skates, MD;  Location: [place];  Service: General;  Laterality: Left;  . ROTATOR CUFF REPAIR     bilateral  . ROTATOR CUFF REPAIR     bil  . TEE WITHOUT CARDIOVERSION N/A 11/24/2019   Procedure: TRANSESOPHAGEAL ECHOCARDIOGRAM (TEE);  Surgeon: Jerline Pain, MD;  Location: [facility] ENDOSCOPY;  Service: Cardiovascular;  Laterality: N/A;  . TOTAL KNEE ARTHROPLASTY     x2 09/2006  . UPPER GASTROINTESTINAL ENDOSCOPY      Current Outpatient Medications  Medication Sig Dispense Refill  . albuterol (PROVENTIL) (2.5 MG/3ML) 0.083% nebulizer solution Take 3 mLs (2.5 mg total) by nebulization every 4 (four) hours as needed for wheezing or shortness of breath (dx: J45.991). 100 vial 5  . ALPRAZolam (XANAX) 0.5 MG tablet Take 0.5 mg by mouth 2 (two) times daily as needed for anxiety.   0  . anastrozole (ARIMIDEX) 1 MG tablet TAKE 1 TABLET BY MOUTH EVERY DAY(NEED APPOINTMENT FOR MORE REFILLS) (Patient taking differently: Take 1 mg by mouth daily. ) 90 tablet 3  . atorvastatin (LIPITOR) 10 MG tablet Take 10 mg by mouth daily.  0  . bisoprolol (ZEBETA) 10 MG tablet Take 1 tablet (10 mg total) by mouth daily. 30 tablet 2  . BREO ELLIPTA 100-25 MCG/INH AEPB Inhale 1 puff into the lungs daily.    . calcium-vitamin D (OSCAL WITH D) 500-200 MG-UNIT per tablet Take 1 tablet by mouth daily with breakfast. 30 tablet 3  . celecoxib (CELEBREX) 200 MG capsule Take 200 mg by mouth every 3 (three) days. In the morning  0  . Cholecalciferol (VITAMIN D3) 10 MCG (400 UNIT) CAPS Take 400 Units by mouth daily.    Marland Kitchen diltiazem (CARTIA XT) 240 MG 24 hr capsule Take 1 capsule (240 mg total) by  mouth daily. (Patient taking differently: Take 240 mg by mouth every other day. ) 30 capsule 11  . flecainide (TAMBOCOR) 100 MG tablet TAKE 1 TABLET BY MOUTH TWICE DAILY 180 tablet 1  . fluticasone (FLONASE) 50 MCG/ACT nasal spray Place 2 sprays into both nostrils daily as needed for allergies or rhinitis.    . furosemide (LASIX) 40 MG tablet Take 40 mg by mouth daily.   0  . hydrOXYzine (ATARAX/VISTARIL) 25 MG tablet Take 25 mg by mouth every 6 (six) hours as needed for itching.   0  . losartan (COZAAR) 100 MG tablet Take 100 mg by mouth daily.   1  . metFORMIN (GLUCOPHAGE) 500 MG tablet Take 500 mg by mouth 2 (two) times daily with a meal.     .  omeprazole (PRILOSEC) 40 MG capsule take 1 capsule by mouth once daily BEFORE BREAKFAST 90 capsule 0  . PARoxetine (PAXIL) 20 MG tablet Take 20 mg by mouth daily.    . potassium chloride (K-DUR) 10 MEQ tablet Take 10 mEq by mouth daily.   0  . PROAIR RESPICLICK 123XX123 (90 Base) MCG/ACT AEPB INHALE 2 PUFFS BY MOUTH EVERY 4 HOURS AS NEEDED (Patient taking differently: Inhale 2 puffs into the lungs every 4 (four) hours as needed (shortness of breath or wheezing). ) 1 each 2  . promethazine (PHENERGAN) 25 MG tablet Take 25 mg by mouth in the morning and at bedtime.     . rivaroxaban (XARELTO) 20 MG TABS tablet TAKE 1 TABLET BY MOUTH EVERY DAY WITH SUPPER 90 tablet 1  . SYMBICORT 160-4.5 MCG/ACT inhaler INHALE 2 PUFFS INTO THE LUNGS TWICE DAILY 10.2 g 0  . vitamin E 1000 UNIT capsule Take 1,000 Units by mouth daily.     No current facility-administered medications for this encounter.   Facility-Administered Medications Ordered in Other Encounters  Medication Dose Route Frequency Provider Last Rate Last Admin  . 0.9 %  sodium chloride infusion   Intravenous Continuous Penland, Kelby Fam, MD        Allergies  Allergen Reactions  . Shellfish Allergy Hives and Shortness Of Breath  . Ace Inhibitors Cough  . Aminophylline Nausea And Vomiting    Heart races  and pounds  . Epinephrine Other (See Comments)    Pallpitations during dental procedures  . Theophyllines Nausea And Vomiting and Other (See Comments)    Heart races and pounds  . Propoxyphene Hcl Nausea And Vomiting    Social History   Socioeconomic History  . Marital status: Widowed    Spouse name: Not on file  . Number of children: 0  . Years of education: Not on file  . Highest education level: Not on file  Occupational History  . Occupation: Database administrator at [employer]  Tobacco Use  . Smoking status: Former Smoker    Packs/day: 1.00    Years: 20.00    Pack years: 20.00    Types: Cigarettes    Quit date: 09/25/1984    Years since quitting: 35.2  . Smokeless tobacco: Never Used  Substance and Sexual Activity  . Alcohol use: No    Alcohol/week: 0.0 standard drinks  . Drug use: No  . Sexual activity: Not Currently  Other Topics Concern  . Not on file  Social History Narrative  . Not on file   Social Determinants of Health   Financial Resource Strain:   . Difficulty of Paying Living Expenses:   Food Insecurity:   . Worried About Charity fundraiser in the Last Year:   . Arboriculturist in the Last Year:   Transportation Needs:   . Film/video editor (Medical):   Marland Kitchen Lack of Transportation (Non-Medical):   Physical Activity:   . Days of Exercise per Week:   . Minutes of Exercise per Session:   Stress:   . Feeling of Stress :   Social Connections:   . Frequency of Communication with Friends and Family:   . Frequency of Social Gatherings with Friends and Family:   . Attends Religious Services:   . Active Member of Clubs or Organizations:   . Attends Archivist Meetings:   Marland Kitchen Marital Status:   Intimate Partner Violence:   . Fear of Current or Ex-Partner:   .  Emotionally Abused:   Marland Kitchen Physically Abused:   . Sexually Abused:     Family History  Problem Relation Age of Onset  . Emphysema Father        smoker  . Heart disease  Father   . Breast cancer Mother 56  . Breast cancer Sister 57  . Heart Problems Sister   . Heart disease Sister   . Breast cancer Maternal Aunt 48  . Lung cancer Maternal Uncle 31       smoker  . Colon cancer Paternal Uncle 2  . Diabetes Maternal Grandmother   . Heart Problems Maternal Grandfather   . Stroke Paternal Grandfather   . Lung cancer Maternal Uncle 72       smoker  . Heart Problems Paternal Uncle   . Diabetes Paternal Grandmother   . Rectal cancer Neg Hx   . Stomach cancer Neg Hx   . Esophageal cancer Neg Hx     ROS- All systems are reviewed and negative except as per the HPI above  Physical Exam: Vitals:   12/23/19 0843  Weight: 119.2 kg  Height: 5\' 6"  (1.676 m)   Wt Readings from Last 3 Encounters:  12/23/19 119.2 kg  12/05/19 114.3 kg  11/26/19 122.5 kg    Labs: Lab Results  Component Value Date   NA 140 12/05/2019   K 3.5 12/05/2019   CL 102 12/05/2019   CO2 27 12/05/2019   GLUCOSE 109 (H) 12/05/2019   BUN 23 12/05/2019   CREATININE 1.72 (H) 12/05/2019   CALCIUM 8.9 12/05/2019   PHOS 5.4 (H) 01/28/2015   MG 2.0 01/28/2015   Lab Results  Component Value Date   INR 1.18 01/25/2015   No results found for: CHOL, HDL, LDLCALC, TRIG   GEN- The patient is well appearing, alert and oriented x 3 today.   Head- normocephalic, atraumatic Eyes-  Sclera clear, conjunctiva pink Ears- hearing intact Oropharynx- clear Neck- supple, no JVP Lymph- no cervical lymphadenopathy Lungs- Clear to ausculation bilaterally, normal work of breathing Heart- slow regular rate and rhythm, no murmurs, rubs or gallops, PMI not laterally displaced GI- soft, NT, ND, + BS Extremities- no clubbing, cyanosis, or edema MS- no significant deformity or atrophy Skin- no rash or lesion Psych- euthymic mood, full affect Neuro- strength and sensation are intact  EKG- sinus brady at 45 bpm, pr int 190 ms, qrs int 96 ms, qtc 441 ms Epic records reviewed   Assessment and  Plan: 1. Afib/flutter S/p ablation 01/2012 Failed  flecainide and cardioversion, subsequent ablation one month ago and is in Sinus brady Decrease bisoprolol to 5  mg qd Reduce Cardizem to 120  mg daily Continue xarelto 20 mg qd   2. OSA Continue compliance with CPAP  3. Morbid obesity Weight loss encouraged, but not thru supplements  4. HTN Stable   5. CHA2DS2VASc score of at least 4 Reminded not to miss any doses of xarelto to 20 mg daily during recovery period   F/u with Dr. Rayann Heman  6/7  Geroge Baseman. [facility], [facility] [address] [place], [place] [address] [phone]

## 2019-12-23 NOTE — Patient Instructions (Signed)
Decrease bisoprolol to 1/2 tablet a day (5mg )  Decrease cardizem (cartia) to 120mg  once a day

## 2019-12-29 ENCOUNTER — Ambulatory Visit: Payer: Medicare Other | Admitting: Internal Medicine

## 2020-01-23 ENCOUNTER — Other Ambulatory Visit: Payer: Self-pay

## 2020-01-23 ENCOUNTER — Encounter (HOSPITAL_COMMUNITY): Payer: Self-pay

## 2020-01-23 ENCOUNTER — Inpatient Hospital Stay (HOSPITAL_COMMUNITY): Payer: Medicare Other | Attending: Hematology

## 2020-01-23 VITALS — BP 134/60 | HR 99 | Temp 97.3°F | Resp 18

## 2020-01-23 DIAGNOSIS — Z79899 Other long term (current) drug therapy: Secondary | ICD-10-CM

## 2020-01-23 DIAGNOSIS — D509 Iron deficiency anemia, unspecified: Secondary | ICD-10-CM | POA: Diagnosis not present

## 2020-01-23 DIAGNOSIS — M8580 Other specified disorders of bone density and structure, unspecified site: Secondary | ICD-10-CM

## 2020-01-23 DIAGNOSIS — C50212 Malignant neoplasm of upper-inner quadrant of left female breast: Secondary | ICD-10-CM | POA: Insufficient documentation

## 2020-01-23 DIAGNOSIS — Z170 Estrogen receptor positive status [ER+]: Secondary | ICD-10-CM

## 2020-01-23 MED ORDER — SODIUM CHLORIDE 0.9 % IV SOLN: Freq: Once | INTRAVENOUS | Status: AC

## 2020-01-23 MED ORDER — SODIUM CHLORIDE 0.9% FLUSH
10.0000 mL | Freq: Once | INTRAVENOUS | Status: AC | PRN
  Administered 2020-01-23: 10 mL

## 2020-01-23 MED ORDER — SODIUM CHLORIDE 0.9 % IV SOLN
510.0000 mg | Freq: Once | INTRAVENOUS | Status: AC
  Administered 2020-01-23: 510 mg via INTRAVENOUS
  Filled 2020-01-23: qty 510

## 2020-01-23 NOTE — Progress Notes (Signed)
Patient tolerated iron infusion with no complaints voiced.  Peripheral IV site clean and dry with good blood return noted before and after infusion.  Band aid applied.  VSS with discharge and left by wheelchair with no s/s of distress noted.  

## 2020-01-30 ENCOUNTER — Ambulatory Visit (HOSPITAL_COMMUNITY): Payer: Medicare Other

## 2020-01-30 MED ORDER — SODIUM CHLORIDE 0.9 % IV SOLN
510.0000 mg | Freq: Once | INTRAVENOUS | Status: AC
  Filled 2020-01-30: qty 17

## 2020-02-05 ENCOUNTER — Ambulatory Visit (HOSPITAL_COMMUNITY): Payer: Medicare Other

## 2020-02-06 ENCOUNTER — Inpatient Hospital Stay (HOSPITAL_COMMUNITY): Payer: Medicare Other | Attending: Hematology

## 2020-02-06 ENCOUNTER — Other Ambulatory Visit: Payer: Self-pay

## 2020-02-06 ENCOUNTER — Encounter (HOSPITAL_COMMUNITY): Payer: Self-pay

## 2020-02-06 VITALS — BP 117/88 | HR 87 | Temp 97.7°F | Resp 20

## 2020-02-06 DIAGNOSIS — Z79899 Other long term (current) drug therapy: Secondary | ICD-10-CM

## 2020-02-06 DIAGNOSIS — N189 Chronic kidney disease, unspecified: Secondary | ICD-10-CM | POA: Diagnosis present

## 2020-02-06 DIAGNOSIS — D631 Anemia in chronic kidney disease: Secondary | ICD-10-CM | POA: Insufficient documentation

## 2020-02-06 DIAGNOSIS — Z170 Estrogen receptor positive status [ER+]: Secondary | ICD-10-CM

## 2020-02-06 DIAGNOSIS — M8580 Other specified disorders of bone density and structure, unspecified site: Secondary | ICD-10-CM

## 2020-02-06 MED ORDER — SODIUM CHLORIDE 0.9 % IV SOLN: Freq: Once | INTRAVENOUS | Status: AC

## 2020-02-06 MED ORDER — CYANOCOBALAMIN 1000 MCG/ML IJ SOLN: 1000.0000 ug | Freq: Once | INTRAMUSCULAR | Status: DC

## 2020-02-06 MED ORDER — SODIUM CHLORIDE 0.9 % IV SOLN
510.0000 mg | Freq: Once | INTRAVENOUS | Status: AC
  Administered 2020-02-06: 510 mg via INTRAVENOUS
  Filled 2020-02-06: qty 510

## 2020-02-06 NOTE — Patient Instructions (Signed)
[facility]  Discharge Instructions:   _______________________________________________________________  Thank you for choosing [facility] at [facility] to provide your oncology and hematology care.  To afford each patient quality time with our providers, please arrive at least 15 minutes before your scheduled appointment.  You need to re-schedule your appointment if you arrive 10 or more minutes late.  We strive to give you quality time with our providers, and arriving late affects you and other patients whose appointments are after yours.  Also, if you no show three or more times for appointments you may be dismissed from the clinic.  Again, thank you for choosing [facility] at [facility]. Our hope is that these requests will allow you access to exceptional care and in a timely manner. _______________________________________________________________  If you have questions after your visit, please contact our office at [phone] between the hours of 8:30 a.m. and 5:00 p.m. Voicemails left after 4:30 p.m. will not be returned until the following business day. _______________________________________________________________  For prescription refill requests, have your pharmacy contact our office. _______________________________________________________________  Recommendations made by the consultant and any test results will be sent to your referring physician. _______________________________________________________________ 

## 2020-02-06 NOTE — Progress Notes (Signed)
Iron infusion given per orders. Patient tolerated it well without problems. Vitals stable and discharged home from clinic via wheelchair. Follow up as scheduled.  

## 2020-02-23 ENCOUNTER — Other Ambulatory Visit (HOSPITAL_COMMUNITY): Payer: Self-pay | Admitting: Nurse Practitioner

## 2020-02-23 DIAGNOSIS — Z170 Estrogen receptor positive status [ER+]: Secondary | ICD-10-CM

## 2020-02-27 ENCOUNTER — Telehealth: Payer: Self-pay

## 2020-02-27 NOTE — Telephone Encounter (Signed)
Called pt to confirm virtual appt on 03/01/20. Pts mailbos was full, and I was unable to reach pt.

## 2020-03-01 ENCOUNTER — Telehealth (INDEPENDENT_AMBULATORY_CARE_PROVIDER_SITE_OTHER): Payer: Medicare Other | Admitting: Internal Medicine

## 2020-03-01 ENCOUNTER — Other Ambulatory Visit: Payer: Self-pay

## 2020-03-01 DIAGNOSIS — I4819 Other persistent atrial fibrillation: Secondary | ICD-10-CM | POA: Diagnosis not present

## 2020-03-01 DIAGNOSIS — G4733 Obstructive sleep apnea (adult) (pediatric): Secondary | ICD-10-CM | POA: Diagnosis not present

## 2020-03-01 DIAGNOSIS — I10 Essential (primary) hypertension: Secondary | ICD-10-CM | POA: Diagnosis not present

## 2020-03-01 NOTE — Progress Notes (Signed)
Electrophysiology TeleHealth Note  Due to national recommendations of social distancing due to [place] 19, an audio telehealth visit is felt to be most appropriate for this patient at this time.  Verbal consent was obtained by me for the telehealth visit today.  The patient does not have capability for a virtual visit.  A phone visit is therefore required today.   Date:  03/01/2020   ID:  [name], DOB 1951/01/09, MRN [id number]  Location: patient's home  Provider location:  [place]  Evaluation Performed: Follow-up visit  PCP:  [name], MD   Electrophysiologist:  Dr Rayann Heman  Chief Complaint:  palpitations  History of Present Illness:    [name] is a 69 y.o. female who presents via telehealth conferencing today.  Since last being seen in our clinic, the patient reports doing very well.  She is pleased with results.  Denies procedure related complications.  Today, she denies symptoms of palpitations, chest pain, shortness of breath,  lower extremity edema, dizziness, presyncope, or syncope.  The patient is otherwise without complaint today.     Past Medical History:  Diagnosis Date  . Adenomatous colon polyp 1993  . Allergy   . Anemia   . Anxiety disorder   . Asthma    Spirometry 1989: FEV1 2.12 (71%) with ratio 82.  HFA 90% after coaching 11-09-10  . Atrial flutter ([place]) chadsvasc of 3 (11/19/14)   Status post cardioversion 2-12 2013. s/p RFCA 5/13 with JAllred ((CTI)  . Breast cancer ([place])    left  . COPD (chronic obstructive pulmonary disease) ([place])   . DDD (degenerative disc disease)   . Diabetes mellitus without complication ([place])   . Diverticulosis   . Essential hypertension, benign   . Fibromyalgia   . GERD (gastroesophageal reflux disease)    EGD with HH / esophagitis 06-30-93  . Hiatal hernia   . Hyperlipidemia   . Internal and external hemorrhoids without complication   . Lipoma of colon   . Migraine   . Osteoarthritis    . Pelvic fracture ([place])   . Persistent atrial fibrillation ([place])   . Personal history of colonic polyps 06/14/2010  . Pneumonia   . PONV (postoperative nausea and vomiting)   . Radiation    left breast cancer  . Sleep apnea    cpap    Past Surgical History:  Procedure Laterality Date  . A FLUTTER ABLATION  02/22/2012  . ATRIAL FIBRILLATION ABLATION N/A 11/25/2019   Procedure: ATRIAL FIBRILLATION ABLATION;  Surgeon: Thompson Grayer, MD;  Location: [place] CV LAB;  Service: Cardiovascular;  Laterality: N/A;  . ATRIAL FLUTTER ABLATION N/A 02/22/2012   Procedure: ATRIAL FLUTTER ABLATION;  Surgeon: Thompson Grayer, MD;  Location: [facility] CATH LAB;  Service: Cardiovascular;  Laterality: N/A;  . BREAST BIOPSY Left 11/2014  . CARDIOVERSION N/A 02/19/2014   Procedure: CARDIOVERSION;  Surgeon: Dorothy Spark, MD;  Location: [place];  Service: Cardiovascular;  Laterality: N/A;  . CARDIOVERSION N/A 07/22/2019   Procedure: CARDIOVERSION;  Surgeon: Josue Hector, MD;  Location: [facility] ENDOSCOPY;  Service: Cardiovascular;  Laterality: N/A;  . COLONOSCOPY  05/2010, 07/2000  . DILATION AND CURETTAGE OF UTERUS    . ESOPHAGOGASTRODUODENOSCOPY  05/2010, 06/1993  . JOINT REPLACEMENT    . POLYPECTOMY    . RADIOACTIVE SEED GUIDED PARTIAL MASTECTOMY WITH AXILLARY SENTINEL LYMPH NODE BIOPSY Left 01/26/2015   Procedure: LEFT  PARTIAL MASTECTOMY WITH  RADIOACTIVE SEED LOCALIZATION, LEFT AXILLARY SENTINEL LYMPH  NODE BIOPSY;  Surgeon: Fanny Skates, MD;  Location: [place];  Service: General;  Laterality: Left;  . ROTATOR CUFF REPAIR     bilateral  . ROTATOR CUFF REPAIR     bil  . TEE WITHOUT CARDIOVERSION N/A 11/24/2019   Procedure: TRANSESOPHAGEAL ECHOCARDIOGRAM (TEE);  Surgeon: Jerline Pain, MD;  Location: [facility] ENDOSCOPY;  Service: Cardiovascular;  Laterality: N/A;  . TOTAL KNEE ARTHROPLASTY     x2 09/2006  . UPPER GASTROINTESTINAL ENDOSCOPY      Current Outpatient Medications  Medication Sig Dispense Refill  .  albuterol (PROVENTIL) (2.5 MG/3ML) 0.083% nebulizer solution Take 3 mLs (2.5 mg total) by nebulization every 4 (four) hours as needed for wheezing or shortness of breath (dx: J45.991). 100 vial 5  . ALPRAZolam (XANAX) 0.5 MG tablet Take 0.5 mg by mouth 2 (two) times daily as needed for anxiety.   0  . anastrozole (ARIMIDEX) 1 MG tablet Take 1 tablet (1 mg total) by mouth daily. 90 tablet 3  . atorvastatin (LIPITOR) 10 MG tablet Take 10 mg by mouth daily.  0  . bisoprolol (ZEBETA) 10 MG tablet Take 0.5 tablets (5 mg total) by mouth daily. 15 tablet 3  . BREO ELLIPTA 100-25 MCG/INH AEPB Inhale 1 puff into the lungs daily.    . calcium-vitamin D (OSCAL WITH D) 500-200 MG-UNIT per tablet Take 1 tablet by mouth daily with breakfast. 30 tablet 3  . celecoxib (CELEBREX) 200 MG capsule Take 200 mg by mouth every 3 (three) days. In the morning  0  . Cholecalciferol (VITAMIN D3) 10 MCG (400 UNIT) CAPS Take 400 Units by mouth daily.    Marland Kitchen diltiazem (CARTIA XT) 120 MG 24 hr capsule Take 1 capsule (120 mg total) by mouth daily. 30 capsule 11  . flecainide (TAMBOCOR) 100 MG tablet TAKE 1 TABLET BY MOUTH TWICE DAILY 180 tablet 1  . fluticasone (FLONASE) 50 MCG/ACT nasal spray Place 2 sprays into both nostrils daily as needed for allergies or rhinitis.    . furosemide (LASIX) 40 MG tablet Take 40 mg by mouth daily.   0  . hydrOXYzine (ATARAX/VISTARIL) 25 MG tablet Take 25 mg by mouth every 6 (six) hours as needed for itching.   0  . losartan (COZAAR) 100 MG tablet Take 100 mg by mouth daily.   1  . metFORMIN (GLUCOPHAGE) 500 MG tablet Take 500 mg by mouth 2 (two) times daily with a meal.     . omeprazole (PRILOSEC) 40 MG capsule take 1 capsule by mouth once daily BEFORE BREAKFAST 90 capsule 0  . PARoxetine (PAXIL) 20 MG tablet Take 20 mg by mouth daily.    . potassium chloride (K-DUR) 10 MEQ tablet Take 10 mEq by mouth daily.   0  . PROAIR RESPICLICK 413 (90 Base) MCG/ACT AEPB INHALE 2 PUFFS BY MOUTH EVERY 4 HOURS  AS NEEDED (Patient taking differently: Inhale 2 puffs into the lungs every 4 (four) hours as needed (shortness of breath or wheezing). ) 1 each 2  . promethazine (PHENERGAN) 25 MG tablet Take 25 mg by mouth in the morning and at bedtime.     . rivaroxaban (XARELTO) 20 MG TABS tablet TAKE 1 TABLET BY MOUTH EVERY DAY WITH SUPPER 90 tablet 1  . SYMBICORT 160-4.5 MCG/ACT inhaler INHALE 2 PUFFS INTO THE LUNGS TWICE DAILY 10.2 g 0  . vitamin E 1000 UNIT capsule Take 1,000 Units by mouth daily.     No current facility-administered medications for this visit.  Facility-Administered Medications Ordered in Other Visits  Medication Dose Route Frequency Provider Last Rate Last Admin  . 0.9 %  sodium chloride infusion   Intravenous Continuous Penland, Kelby Fam, MD      . ferumoxytol (FERAHEME) 510 mg in sodium chloride 0.9 % 100 mL IVPB  510 mg Intravenous Once Lockamy, Randi L, NP-C        Allergies:   Shellfish allergy, Ace inhibitors, Aminophylline, Epinephrine, Theophyllines, and Propoxyphene hcl   Social History:  The patient  reports that she quit smoking about 35 years ago. Her smoking use included cigarettes. She has a 20.00 pack-year smoking history. She has never used smokeless tobacco. She reports that she does not drink alcohol or use drugs.   ROS:  Please see the history of present illness.   All other systems are personally reviewed and negative.    Exam:    Vital Signs:  There were no vitals taken for this visit.  Well sounding, alert and conversant   Labs/Other Tests and Data Reviewed:    Recent Labs: 12/05/2019: ALT 12; BUN 23; Creatinine, Ser 1.72; Hemoglobin 9.3; Platelets 385; Potassium 3.5; Sodium 140   Wt Readings from Last 3 Encounters:  12/23/19 262 lb 12.8 oz (119.2 kg)  12/05/19 252 lb (114.3 kg)  11/26/19 270 lb 1 oz (122.5 kg)       ASSESSMENT & PLAN:    1.  Persistent atrial fibrillation Doing very well post ablation Denies procedure related  complications Does not wish to stop flecainide at this time No changes today Importance of lifestyle modification was discussed at length today chads2vasc score is at least 4. Continue xarelto  2. Obesity Lifestyle modification was advised  3. OSA She is compliant with CPAP  4. HTN Stable No change required today   Risks, benefits and potential toxicities for medications prescribed and/or refilled reviewed with patient today.   Follow-up:  3 months with me   Patient Risk:  after full review of this patients clinical status, I feel that they are at moderate risk at this time.  Today, I have spent 15 minutes with the patient with telehealth technology discussing arrhythmia management .    Army Fossa, MD  03/01/2020 9:10 AM     [facility] [address] [place] [address] [phone] (office) [phone] (fax)

## 2020-03-05 ENCOUNTER — Other Ambulatory Visit: Payer: Self-pay

## 2020-03-05 ENCOUNTER — Inpatient Hospital Stay (HOSPITAL_COMMUNITY): Payer: Medicare Other | Attending: Hematology

## 2020-03-05 DIAGNOSIS — D649 Anemia, unspecified: Secondary | ICD-10-CM | POA: Diagnosis not present

## 2020-03-05 DIAGNOSIS — Z170 Estrogen receptor positive status [ER+]: Secondary | ICD-10-CM | POA: Insufficient documentation

## 2020-03-05 DIAGNOSIS — Z7901 Long term (current) use of anticoagulants: Secondary | ICD-10-CM | POA: Insufficient documentation

## 2020-03-05 DIAGNOSIS — E538 Deficiency of other specified B group vitamins: Secondary | ICD-10-CM | POA: Insufficient documentation

## 2020-03-05 DIAGNOSIS — I4892 Unspecified atrial flutter: Secondary | ICD-10-CM | POA: Insufficient documentation

## 2020-03-05 DIAGNOSIS — E559 Vitamin D deficiency, unspecified: Secondary | ICD-10-CM | POA: Insufficient documentation

## 2020-03-05 DIAGNOSIS — Z79811 Long term (current) use of aromatase inhibitors: Secondary | ICD-10-CM | POA: Diagnosis not present

## 2020-03-05 DIAGNOSIS — M8580 Other specified disorders of bone density and structure, unspecified site: Secondary | ICD-10-CM | POA: Insufficient documentation

## 2020-03-05 DIAGNOSIS — C50212 Malignant neoplasm of upper-inner quadrant of left female breast: Secondary | ICD-10-CM | POA: Diagnosis not present

## 2020-03-05 LAB — CBC WITH DIFFERENTIAL/PLATELET
Abs Immature Granulocytes: 0.02 10*3/uL (ref 0.00–0.07)
Basophils Absolute: 0.1 10*3/uL (ref 0.0–0.1)
Basophils Relative: 1 %
Eosinophils Absolute: 1.1 10*3/uL — ABNORMAL HIGH (ref 0.0–0.5)
Eosinophils Relative: 14 %
HCT: 38.9 % (ref 36.0–46.0)
Hemoglobin: 11.4 g/dL — ABNORMAL LOW (ref 12.0–15.0)
Immature Granulocytes: 0 %
Lymphocytes Relative: 17 %
Lymphs Abs: 1.3 10*3/uL (ref 0.7–4.0)
MCH: 28.6 pg (ref 26.0–34.0)
MCHC: 29.3 g/dL — ABNORMAL LOW (ref 30.0–36.0)
MCV: 97.5 fL (ref 80.0–100.0)
Monocytes Absolute: 0.5 10*3/uL (ref 0.1–1.0)
Monocytes Relative: 7 %
Neutro Abs: 4.7 10*3/uL (ref 1.7–7.7)
Neutrophils Relative %: 61 %
Platelets: 325 10*3/uL (ref 150–400)
RBC: 3.99 MIL/uL (ref 3.87–5.11)
RDW: 15.9 % — ABNORMAL HIGH (ref 11.5–15.5)
WBC: 7.6 10*3/uL (ref 4.0–10.5)
nRBC: 0 % (ref 0.0–0.2)

## 2020-03-05 LAB — COMPREHENSIVE METABOLIC PANEL
ALT: 13 U/L (ref 0–44)
AST: 13 U/L — ABNORMAL LOW (ref 15–41)
Albumin: 4 g/dL (ref 3.5–5.0)
Alkaline Phosphatase: 54 U/L (ref 38–126)
Anion gap: 14 (ref 5–15)
BUN: 35 mg/dL — ABNORMAL HIGH (ref 8–23)
CO2: 25 mmol/L (ref 22–32)
Calcium: 8.4 mg/dL — ABNORMAL LOW (ref 8.9–10.3)
Chloride: 101 mmol/L (ref 98–111)
Creatinine, Ser: 1.91 mg/dL — ABNORMAL HIGH (ref 0.44–1.00)
GFR calc Af Amer: 30 mL/min — ABNORMAL LOW (ref >60)
GFR calc non Af Amer: 26 mL/min — ABNORMAL LOW (ref >60)
Glucose, Bld: 105 mg/dL — ABNORMAL HIGH (ref 70–99)
Potassium: 4.2 mmol/L (ref 3.5–5.1)
Sodium: 140 mmol/L (ref 135–145)
Total Bilirubin: 0.5 mg/dL (ref 0.3–1.2)
Total Protein: 7.5 g/dL (ref 6.5–8.1)

## 2020-03-05 LAB — IRON AND TIBC
Iron: 81 ug/dL (ref 28–170)
Saturation Ratios: 26 % (ref 10.4–31.8)
TIBC: 311 ug/dL (ref 250–450)
UIBC: 230 ug/dL

## 2020-03-05 LAB — LACTATE DEHYDROGENASE: LDH: 122 U/L (ref 98–192)

## 2020-03-05 LAB — VITAMIN B12: Vitamin B-12: 570 pg/mL (ref 180–914)

## 2020-03-05 LAB — FOLATE: Folate: 7.1 ng/mL (ref >5.9)

## 2020-03-05 LAB — VITAMIN D 25 HYDROXY (VIT D DEFICIENCY, FRACTURES): Vit D, 25-Hydroxy: 42.04 ng/mL (ref 30–100)

## 2020-03-05 LAB — FERRITIN: Ferritin: 253 ng/mL (ref 11–307)

## 2020-03-12 ENCOUNTER — Inpatient Hospital Stay (HOSPITAL_BASED_OUTPATIENT_CLINIC_OR_DEPARTMENT_OTHER): Payer: Medicare Other | Admitting: Nurse Practitioner

## 2020-03-12 DIAGNOSIS — C50212 Malignant neoplasm of upper-inner quadrant of left female breast: Secondary | ICD-10-CM | POA: Diagnosis not present

## 2020-03-12 DIAGNOSIS — Z170 Estrogen receptor positive status [ER+]: Secondary | ICD-10-CM

## 2020-03-12 MED ORDER — ANASTROZOLE 1 MG PO TABS: 1.0000 mg | ORAL_TABLET | Freq: Every day | ORAL | 3 refills | Status: AC

## 2020-03-12 NOTE — Progress Notes (Signed)
[place], [place] [address]   CLINIC:  Medical Oncology/Hematology  PCP:  Shelley Chroman, MD [place] [address] [phone]   REASON FOR VISIT: Follow-up for breast cancer   CURRENT THERAPY: Anastrozole  BRIEF ONCOLOGIC HISTORY:  Oncology History  Breast cancer of upper-inner quadrant of left female breast ([place])  12/15/2014 Mammogram   new 64m round nodule in left breast is indeterminate   12/17/2014 Imaging   6 mm lobulated nodule in the left breast is at low suspicion for malignancy. ultrasound guided biopsy recommended   12/23/2014 Procedure   [facility]: Invasive ductal carcinoma.  ER+ (100%), PR+ (99%), HER2 neg (ratio 1.61). Ki67 45%.    01/26/2015 Surgery   (L) lumpectomy with SLNB ([name]: IDC, grade 2, spanning 1.1 cm; also low-grade DCIS; margins negative. 0/3 left axillary SLN. HER2 repeated and remains neg. (ratio 1.33).  pT1c, pN0: Stage IA   03/19/2015 Genetic Testing   Genetic testing: Negative. Genes analyzed: ATM, BARD1, BRCA1, BRCA2, BRIP1, CDH1, CHEK2, FANCC, MLH1, MSH2, MSH6, NBN, PALB2, PMS2, PTEN, RAD51C, RAD51D, STK11, TP53, and XRCC2.  Additionally, this panel includes deletion/duplication analysis (without next-generation sequencing) of one gene, EPCAM.   03/2015 - 04/2015 Radiation Therapy   Adjuvant breast radiation with [name]    04/30/2015 Imaging   DEXA scan: Osteopenia. (T-Score of -1.1)   05/2015 -  Anti-estrogen oral therapy   Anastrozole. Planned duration of therapy 5-10 years.    07/16/2015 Miscellaneous   Prolia injections started.        INTERVAL HISTORY:  Ms. [id number].[id number]. female returns for routine follow-up for breast cancer.  She is doing well since her last visit.  She is taking her medication as prescribed with no unwanted side effects.  She denies any bright red bleeding per rectum or melena.  She denies any easy bruising or bleeding.  Denies any nausea, vomiting, or diarrhea. Denies any new pains. Had not noticed any recent bleeding such as epistaxis, hematuria or hematochezia. Denies recent chest Murphy on exertion, shortness of breath on minimal exertion, pre-syncopal episodes, or palpitations. Denies any numbness or tingling in hands or feet. Denies any recent fevers, infections, or recent hospitalizations. Patient reports appetite at 50% and energy level at 25%.  She is eating well maintain her weight this time.    REVIEW OF SYSTEMS:  Review of Systems  Respiratory: Positive for cough and shortness of breath.   Neurological: Positive for dizziness and numbness.  All other systems reviewed and are negative.    PAST MEDICAL/SURGICAL HISTORY:  Past Medical History:  Diagnosis Date  . Adenomatous colon polyp 1993  . Allergy   . Anemia   . Anxiety disorder   . Asthma    Spirometry 1989: FEV1 2.12 (71%) with ratio 82.  HFA 90% after coaching 11-09-10  . Atrial flutter ([place] ch[place]vasc [place] 3 (11/19/14)   Status post cardioversion 2-12 2013. s/p RFCA 5/13 with JAllred (([facility])  . Breast cancer ([place]    left  . COPD (chronic obstructive pulmonary disease) ([place]   . DDD (degenerative disc disease)   . Diabetes mellitus without complication ([place]   . Diverticulosis   . Essential hypertension, benign   . Fibromyalgia   . GERD (gastroesophageal reflux disease)    EGD with HH / esophagitis 06-30-93  . Hiatal hernia   . Hyperlipidemia   . Internal and external hemorrhoids without complication   .  Lipoma of colon   . Migraine   . Osteoarthritis   . Pelvic fracture ([place])   . Persistent atrial fibrillation ([place])   . Personal history of colonic polyps 06/14/2010  . Pneumonia   . PONV (postoperative nausea and vomiting)   . Radiation    left breast cancer  . Sleep apnea    cpap   Past Surgical History:  Procedure Laterality Date  . A FLUTTER ABLATION  02/22/2012  . ATRIAL FIBRILLATION ABLATION N/A 11/25/2019   Procedure:  ATRIAL FIBRILLATION ABLATION;  Surgeon: Shelley Grayer, MD;  Location: [facility];  Service: Cardiovascular;  Laterality: N/A;  . ATRIAL FLUTTER ABLATION N/A 02/22/2012   Procedure: ATRIAL FLUTTER ABLATION;  Surgeon: Shelley Grayer, MD;  Location: [facility] CATH LAB;  Service: Cardiovascular;  Laterality: N/A;  . BREAST BIOPSY Left 11/2014  . CARDIOVERSION N/A 02/19/2014   Procedure: CARDIOVERSION;  Surgeon: Shelley Spark, MD;  Location: [facility];  Service: Cardiovascular;  Laterality: N/A;  . CARDIOVERSION N/A 07/22/2019   Procedure: CARDIOVERSION;  Surgeon: Shelley Hector, MD;  Location: [facility] ENDOSCOPY;  Service: Cardiovascular;  Laterality: N/A;  . COLONOSCOPY  05/2010, 07/2000  . DILATION AND CURETTAGE OF UTERUS    . ESOPHAGOGASTRODUODENOSCOPY  05/2010, 06/1993  . JOINT REPLACEMENT    . POLYPECTOMY    . RADIOACTIVE SEED GUIDED PARTIAL MASTECTOMY WITH AXILLARY SENTINEL LYMPH NODE BIOPSY Left 01/26/2015   Procedure: LEFT  PARTIAL MASTECTOMY WITH  RADIOACTIVE SEED LOCALIZATION, LEFT AXILLARY SENTINEL LYMPH NODE BIOPSY;  Surgeon: Shelley Skates, MD;  Location: [place];  Service: General;  Laterality: Left;  . ROTATOR CUFF REPAIR     bilateral  . ROTATOR CUFF REPAIR     bil  . TEE WITHOUT CARDIOVERSION N/A 11/24/2019   Procedure: TRANSESOPHAGEAL ECHOCARDIOGRAM (TEE);  Surgeon: Shelley Pain, MD;  Location: [facility] ENDOSCOPY;  Service: Cardiovascular;  Laterality: N/A;  . TOTAL KNEE ARTHROPLASTY     x2 09/2006  . UPPER GASTROINTESTINAL ENDOSCOPY       SOCIAL HISTORY:  Social History   Socioeconomic History  . Marital status: Widowed    Spouse name: Not on file  . Number of children: 0  . Years of education: Not on file  . Highest education level: Not on file  Occupational History  . Occupation: Database administrator at [employer]  Tobacco Use  . Smoking status: Former Smoker    Packs/day: 1.00    Years: 20.00    Pack years: 20.00    Types: Cigarettes    Quit date: 09/25/1984     Years since quitting: 35.4  . Smokeless tobacco: Never Used  Vaping Use  . Vaping Use: Never used  Substance and Sexual Activity  . Alcohol use: No    Alcohol/week: 0.0 standard drinks  . Drug use: No  . Sexual activity: Not Currently  Other Topics Concern  . Not on file  Social History Narrative  . Not on file   Social Determinants of Health   Financial Resource Strain:   . Difficulty of Paying Living Expenses:   Food Insecurity:   . Worried About Charity fundraiser in the Last Year:   . Arboriculturist in the Last Year:   Transportation Needs:   . Film/video editor (Medical):   Marland Kitchen Lack of Transportation (Non-Medical):   Physical Activity:   . Days of Exercise per Week:   . Minutes of Exercise per Session:   Stress:   . Feeling of  Stress :   Social Connections:   . Frequency of Communication with Friends and Family:   . Frequency of Social Gatherings with Friends and Family:   . Attends Religious Services:   . Active Member of Clubs or Organizations:   . Attends Archivist Meetings:   Marland Kitchen Marital Status:   Intimate Partner Violence:   . Fear of Current or Ex-Partner:   . Emotionally Abused:   Marland Kitchen Physically Abused:   . Sexually Abused:     FAMILY HISTORY:  Family History  Problem Relation Age of Onset  . Emphysema Father        smoker  . Heart disease Father   . Breast cancer Mother 42  . Breast cancer Sister 39  . Heart Problems Sister   . Heart disease Sister   . Breast cancer Maternal Aunt 48  . Lung cancer Maternal Uncle 58       smoker  . Colon cancer Paternal Uncle 57  . Diabetes Maternal Grandmother   . Heart Problems Maternal Grandfather   . Stroke Paternal Grandfather   . Lung cancer Maternal Uncle 72       smoker  . Heart Problems Paternal Uncle   . Diabetes Paternal Grandmother   . Rectal cancer Neg Hx   . Stomach cancer Neg Hx   . Esophageal cancer Neg Hx     CURRENT MEDICATIONS:  Outpatient Encounter Medications as of  03/12/2020  Medication Sig  . ALPRAZolam (XANAX) 0.5 MG tablet Take 0.5 mg by mouth 2 (two) times daily as needed for anxiety.   Marland Kitchen anastrozole (ARIMIDEX) 1 MG tablet Take 1 tablet (1 mg total) by mouth daily.  Marland Kitchen atorvastatin (LIPITOR) 10 MG tablet Take 10 mg by mouth daily.  . bisoprolol (ZEBETA) 10 MG tablet Take 0.5 tablets (5 mg total) by mouth daily.  Marland Kitchen BREO ELLIPTA 100-25 MCG/INH AEPB Inhale 1 puff into the lungs daily.  . calcium-vitamin D (OSCAL WITH D) 500-200 MG-UNIT per tablet Take 1 tablet by mouth daily with breakfast.  . celecoxib (CELEBREX) 200 MG capsule Take 200 mg by mouth every 3 (three) days. In the morning  . Cholecalciferol (VITAMIN D3) 10 MCG (400 UNIT) CAPS Take 400 Units by mouth daily.  Marland Kitchen diltiazem (CARTIA XT) 120 MG 24 hr capsule Take 1 capsule (120 mg total) by mouth daily.  . flecainide (TAMBOCOR) 100 MG tablet TAKE 1 TABLET BY MOUTH TWICE DAILY  . fluticasone (FLONASE) 50 MCG/ACT nasal spray Place 2 sprays into both nostrils daily as needed for allergies or rhinitis.  . furosemide (LASIX) 40 MG tablet Take 40 mg by mouth daily.   Marland Kitchen losartan (COZAAR) 100 MG tablet Take 100 mg by mouth daily.   . metFORMIN (GLUCOPHAGE) 500 MG tablet Take 500 mg by mouth 2 (two) times daily with a meal.   . omeprazole (PRILOSEC) 40 MG capsule take 1 capsule by mouth once daily BEFORE BREAKFAST  . PARoxetine (PAXIL) 20 MG tablet Take 20 mg by mouth daily.  . potassium chloride (K-DUR) 10 MEQ tablet Take 10 mEq by mouth daily.   . rivaroxaban (XARELTO) 20 MG TABS tablet TAKE 1 TABLET BY MOUTH EVERY DAY WITH SUPPER  . SYMBICORT 160-4.5 MCG/ACT inhaler INHALE 2 PUFFS INTO THE LUNGS TWICE DAILY  . vitamin E 1000 UNIT capsule Take 1,000 Units by mouth daily.  Marland Kitchen albuterol (PROVENTIL) (2.5 MG/3ML) 0.083% nebulizer solution Take 3 mLs (2.5 mg total) by nebulization every 4 (four) hours as needed for wheezing  or shortness of breath (dx: J45.991). (Patient not taking: Reported on 03/12/2020)  .  HYDROcodone-acetaminophen (NORCO/VICODIN) 5-325 MG tablet Take 1 tablet by mouth 2 (two) times daily. (Patient not taking: Reported on 03/12/2020)  . hydrOXYzine (ATARAX/VISTARIL) 25 MG tablet Take 25 mg by mouth every 6 (six) hours as needed for itching.  (Patient not taking: Reported on 03/12/2020)  . PROAIR RESPICLICK 108 (90 Base) MCG/ACT AEPB INHALE 2 PUFFS BY MOUTH EVERY 4 HOURS AS NEEDED (Patient not taking: Reported on 03/12/2020)  . promethazine (PHENERGAN) 25 MG tablet Take 25 mg by mouth in the morning and at bedtime.  (Patient not taking: Reported on 03/12/2020)   Facility-Administered Encounter Medications as of 03/12/2020  Medication  . 0.9 %  sodium chloride infusion  . ferumoxytol (FERAHEME) 510 mg in sodium chloride 0.9 % 100 mL IVPB    ALLERGIES:  Allergies  Allergen Reactions  . Shellfish Allergy Hives and Shortness Of Breath  . Ace Inhibitors Cough  . Aminophylline Nausea And Vomiting    Heart races and pounds  . Epinephrine Other (See Comments)    Pallpitations during dental procedures  . Theophyllines Nausea And Vomiting and Other (See Comments)    Heart races and pounds  . Propoxyphene Hcl Nausea And Vomiting     PHYSICAL EXAM:  ECOG Performance status: 1  Vitals:   03/12/20 1112  BP: (!) 103/55  Pulse: 97  Resp: 18  Temp: (!) 96.9 F (36.1 C)  SpO2: 96%   Filed Weights      Physical Exam Constitutional:      Appearance: Normal appearance. She is normal weight.  Cardiovascular:     Rate and Rhythm: Normal rate and regular rhythm.     Heart sounds: Normal heart sounds.  Pulmonary:     Effort: Pulmonary effort is normal.     Breath sounds: Normal breath sounds.  Abdominal:     General: Bowel sounds are normal.     Palpations: Abdomen is soft.  Musculoskeletal:        General: Normal range of motion.  Skin:    General: Skin is warm.  Neurological:     Mental Status: She is alert and oriented to person, place, and time. Mental status is at  baseline.  Psychiatric:        Mood and Affect: Mood normal.        Behavior: Behavior normal.        Thought Content: Thought content normal.        Judgment: Judgment normal.      LABORATORY DATA:  I have reviewed the labs as listed.  CBC    Component Value Date/Time   WBC 7.6 03/05/2020 1102   RBC 3.99 03/05/2020 1102   HGB 11.4 (L) 03/05/2020 1102   HCT 38.9 03/05/2020 1102   PLT 325 03/05/2020 1102   MCV 97.5 03/05/2020 1102   MCH 28.6 03/05/2020 1102   MCHC 29.3 (L) 03/05/2020 1102   RDW 15.9 (H) 03/05/2020 1102   LYMPHSABS 1.3 03/05/2020 1102   MONOABS 0.5 03/05/2020 1102   EOSABS 1.1 (H) 03/05/2020 1102   BASOSABS 0.1 03/05/2020 1102   CMP Latest Ref Rng & Units 03/05/2020 12/05/2019 11/24/2019  Glucose 70 - 99 mg/dL 875(I) 433(I) 951(O)  BUN 8 - 23 mg/dL 84(Z) 23 21  Creatinine 0.44 - 1.00 mg/dL 6.60(Y) 3.01(S) 0.10(X)  Sodium 135 - 145 mmol/L 140 140 140  Potassium 3.5 - 5.1 mmol/L 4.2 3.5 3.6  Chloride 98 - 111 mmol/L  101 102 102  CO2 22 - 32 mmol/L '25 27 28  '$ Calcium 8.9 - 10.3 mg/dL 8.4(L) 8.9 9.4  Total Protein 6.5 - 8.1 g/dL 7.5 7.6 -  Total Bilirubin 0.3 - 1.2 mg/dL 0.5 0.7 -  Alkaline Phos 38 - 126 U/L 54 60 -  AST 15 - 41 U/L 13(L) 11(L) -  ALT 0 - 44 U/L 13 12 -    All questions were answered to patient's stated satisfaction. Encouraged patient to call with any new concerns or questions before his next visit to the cancer center and we can certain see him sooner, if needed.     ASSESSMENT & PLAN:  Breast cancer of upper-inner quadrant of left female breast 1.  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 - She was treated with a left breast lumpectomy by Dr. Dalbert Murphy. - Followed by adjuvant radiation therapy with Dr. Pablo Murphy.  Completed radiation 04/2015. - She started antiestrogen therapy with anastrozole in 05/2015.  The plan is to continue this  therapy for 10 years. - Her Oncotype DX was low risk score.  Patient was to continue therapy for 10 years. - Her mammogram will be done 02/2019 which was BI-RADS Category 0 incomplete.  Ultrasound was done which showed scar tissue.  She will repeat in 1 year with mammogram. -She will schedule her mammogram to be done with [facility] in August 2021. -She will continue to take her anastrozole. - We will continue to follow her every 6 months with labs.   2.  Osteopenia: - Her last DEXA scan done on 04/2018 showed osteopenia, with a T score of -1.4 -She is receiving Prolia every 6 months.  -She received her injection today 12/05/2019 -She is continuing to take calcium and vitamin D. -We will schedule her a DEXA scan at her next visit.  3.  Anemia: -Likely due from CKD. -Last Feraheme infusion was on 05/30/2019 and 06/19/2019 -Labs done on 03/05/2020 showed hemoglobin 11.4, ferritin 253, percent saturation 26. -She responded very well to the 2 iron infusions that we gave her last time. - She will return to the clinic in 3 months for follow-up labs.  5.  B12 deficiency: - Labs on 01/24/2019 showed her B12 at 185. -Labs on 05/20/2019 showed her B12 level came up to 1376. - She is supposed to be given herself monthly B12 injections however she forgets to do this often. - I have started her on oral B12 daily to see if that helps since she forgets the injections and does not like doing them. -Labs done on 03/05/2020 showed B12 at 570 we will continue with the oral B12 daily. -We will follow-up with labs in 3 months.  6.  Vitamin D deficiency: - Labs on 01/24/2019 showed her vitamin D level 29.5. -She takes vitamin D daily. - I have increased her dose to twice a day. -Labs done on 03/05/2020 shows vitamin D level 42.04 - We will follow-up in 3 months with labs  7.  Atrial flutter: -Patient is on Xarelto. -Patient follows up with PCP or cardiology as recommended.     Orders placed this encounter:   Orders Placed This Encounter  Procedures  . Lactate dehydrogenase  . CBC with Differential/Platelet  . Comprehensive metabolic panel  . Ferritin  . Iron and TIBC  . Vitamin B12  . VITAMIN D 25 Hydroxy (Vit-D Deficiency, Fractures)  . Folate  Francene Finders, FNP-C [place] [phone]

## 2020-03-12 NOTE — Assessment & Plan Note (Addendum)
1.  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 - She was treated with a left breast lumpectomy by Dr. Dalbert Batman. - Followed by adjuvant radiation therapy with Dr. Pablo Ledger.  Completed radiation 04/2015. - She started antiestrogen therapy with anastrozole in 05/2015.  The plan is to continue this therapy for 10 years. - Her Oncotype DX was low risk score.  Patient was to continue therapy for 10 years. - Her mammogram will be done 02/2019 which was BI-RADS Category 0 incomplete.  Ultrasound was done which showed scar tissue.  She will repeat in 1 year with mammogram. -She will schedule her mammogram to be done with [facility] in August 2021. -She will continue to take her anastrozole. - We will continue to follow her every 6 months with labs.   2.  Osteopenia: - Her last DEXA scan done on 04/2018 showed osteopenia, with a T score of -1.4 -She is receiving Prolia every 6 months.  -She received her injection today 12/05/2019 -She is continuing to take calcium and vitamin D. -We will schedule her a DEXA scan at her next visit.  3.  Anemia: -Likely due from CKD. -Last Feraheme infusion was on 05/30/2019 and 06/19/2019 -Labs done on 03/05/2020 showed hemoglobin 11.4, ferritin 253, percent saturation 26. -She responded very well to the 2 iron infusions that we gave her last time. - She will return to the clinic in 3 months for follow-up labs.  5.  B12 deficiency: - Labs on 01/24/2019 showed her B12 at 185. -Labs on 05/20/2019 showed her B12 level came up to 1376. - She is supposed to be given herself monthly B12 injections however she forgets to do this often. - I have started her on oral B12 daily to see if that helps since she forgets the injections and does not like doing them. -Labs done on 03/05/2020 showed B12 at 570 we will continue with the oral B12 daily. -We will follow-up with labs in 3  months.  6.  Vitamin D deficiency: - Labs on 01/24/2019 showed her vitamin D level 29.5. -She takes vitamin D daily. - I have increased her dose to twice a day. -Labs done on 03/05/2020 shows vitamin D level 42.04 - We will follow-up in 3 months with labs  7.  Atrial flutter: -Patient is on Xarelto. -Patient follows up with PCP or cardiology as recommended.

## 2020-05-14 ENCOUNTER — Other Ambulatory Visit: Payer: Self-pay | Admitting: Internal Medicine

## 2020-05-14 DIAGNOSIS — I480 Paroxysmal atrial fibrillation: Secondary | ICD-10-CM

## 2020-05-17 NOTE — Telephone Encounter (Signed)
Prescription refill request for Xarelto received.   Last office visit: 03/01/2020, Allred Weight: 119.2 kg Age: 69 y.o. Scr: 1.91, 03/05/2020 CrCl:  50.84 ml/min   Prescription refill sent.

## 2020-06-02 ENCOUNTER — Ambulatory Visit: Payer: Medicare Other | Admitting: Internal Medicine

## 2020-06-07 ENCOUNTER — Ambulatory Visit (HOSPITAL_COMMUNITY): Payer: Medicare Other

## 2020-06-07 ENCOUNTER — Other Ambulatory Visit (HOSPITAL_COMMUNITY): Payer: Medicare Other

## 2020-06-08 ENCOUNTER — Inpatient Hospital Stay (HOSPITAL_COMMUNITY): Payer: Medicare Other

## 2020-06-08 ENCOUNTER — Inpatient Hospital Stay (HOSPITAL_COMMUNITY): Payer: Medicare Other | Admitting: Nurse Practitioner

## 2020-06-15 ENCOUNTER — Other Ambulatory Visit: Payer: Self-pay

## 2020-06-15 ENCOUNTER — Inpatient Hospital Stay (HOSPITAL_COMMUNITY): Payer: Medicare Other | Attending: Nurse Practitioner | Admitting: Nurse Practitioner

## 2020-06-15 ENCOUNTER — Inpatient Hospital Stay (HOSPITAL_COMMUNITY): Payer: Medicare Other | Attending: Hematology

## 2020-06-15 ENCOUNTER — Inpatient Hospital Stay (HOSPITAL_COMMUNITY): Payer: Medicare Other

## 2020-06-15 ENCOUNTER — Encounter (HOSPITAL_COMMUNITY): Payer: Self-pay | Admitting: Lab

## 2020-06-15 ENCOUNTER — Encounter (HOSPITAL_COMMUNITY): Payer: Self-pay | Admitting: Nurse Practitioner

## 2020-06-15 VITALS — BP 119/46 | HR 95 | Temp 97.1°F | Resp 18

## 2020-06-15 DIAGNOSIS — Z170 Estrogen receptor positive status [ER+]: Secondary | ICD-10-CM | POA: Insufficient documentation

## 2020-06-15 DIAGNOSIS — M8580 Other specified disorders of bone density and structure, unspecified site: Secondary | ICD-10-CM

## 2020-06-15 DIAGNOSIS — Z79811 Long term (current) use of aromatase inhibitors: Secondary | ICD-10-CM | POA: Insufficient documentation

## 2020-06-15 DIAGNOSIS — E538 Deficiency of other specified B group vitamins: Secondary | ICD-10-CM | POA: Insufficient documentation

## 2020-06-15 DIAGNOSIS — C50212 Malignant neoplasm of upper-inner quadrant of left female breast: Secondary | ICD-10-CM | POA: Insufficient documentation

## 2020-06-15 DIAGNOSIS — N189 Chronic kidney disease, unspecified: Secondary | ICD-10-CM | POA: Diagnosis not present

## 2020-06-15 DIAGNOSIS — D649 Anemia, unspecified: Secondary | ICD-10-CM | POA: Insufficient documentation

## 2020-06-15 DIAGNOSIS — E559 Vitamin D deficiency, unspecified: Secondary | ICD-10-CM | POA: Diagnosis not present

## 2020-06-15 DIAGNOSIS — I4892 Unspecified atrial flutter: Secondary | ICD-10-CM | POA: Diagnosis not present

## 2020-06-15 DIAGNOSIS — W19XXXS Unspecified fall, sequela: Secondary | ICD-10-CM

## 2020-06-15 DIAGNOSIS — D631 Anemia in chronic kidney disease: Secondary | ICD-10-CM | POA: Diagnosis not present

## 2020-06-15 DIAGNOSIS — Z7901 Long term (current) use of anticoagulants: Secondary | ICD-10-CM | POA: Insufficient documentation

## 2020-06-15 DIAGNOSIS — Z79899 Other long term (current) drug therapy: Secondary | ICD-10-CM

## 2020-06-15 LAB — FERRITIN: Ferritin: 246 ng/mL (ref 11–307)

## 2020-06-15 LAB — COMPREHENSIVE METABOLIC PANEL
ALT: 11 U/L (ref 0–44)
AST: 17 U/L (ref 15–41)
Albumin: 3.5 g/dL (ref 3.5–5.0)
Alkaline Phosphatase: 58 U/L (ref 38–126)
Anion gap: 13 (ref 5–15)
BUN: 21 mg/dL (ref 8–23)
CO2: 26 mmol/L (ref 22–32)
Calcium: 9.5 mg/dL (ref 8.9–10.3)
Chloride: 98 mmol/L (ref 98–111)
Creatinine, Ser: 1.51 mg/dL — ABNORMAL HIGH (ref 0.44–1.00)
GFR calc Af Amer: 40 mL/min — ABNORMAL LOW (ref >60)
GFR calc non Af Amer: 34 mL/min — ABNORMAL LOW (ref >60)
Glucose, Bld: 153 mg/dL — ABNORMAL HIGH (ref 70–99)
Potassium: 3.4 mmol/L — ABNORMAL LOW (ref 3.5–5.1)
Sodium: 137 mmol/L (ref 135–145)
Total Bilirubin: 0.6 mg/dL (ref 0.3–1.2)
Total Protein: 6.9 g/dL (ref 6.5–8.1)

## 2020-06-15 LAB — CBC WITH DIFFERENTIAL/PLATELET
Abs Immature Granulocytes: 0.02 10*3/uL (ref 0.00–0.07)
Basophils Absolute: 0.1 10*3/uL (ref 0.0–0.1)
Basophils Relative: 1 %
Eosinophils Absolute: 0.3 10*3/uL (ref 0.0–0.5)
Eosinophils Relative: 4 %
HCT: 32.8 % — ABNORMAL LOW (ref 36.0–46.0)
Hemoglobin: 10 g/dL — ABNORMAL LOW (ref 12.0–15.0)
Immature Granulocytes: 0 %
Lymphocytes Relative: 12 %
Lymphs Abs: 0.9 10*3/uL (ref 0.7–4.0)
MCH: 29.5 pg (ref 26.0–34.0)
MCHC: 30.5 g/dL (ref 30.0–36.0)
MCV: 96.8 fL (ref 80.0–100.0)
Monocytes Absolute: 0.5 10*3/uL (ref 0.1–1.0)
Monocytes Relative: 7 %
Neutro Abs: 5.7 10*3/uL (ref 1.7–7.7)
Neutrophils Relative %: 76 %
Platelets: 364 10*3/uL (ref 150–400)
RBC: 3.39 MIL/uL — ABNORMAL LOW (ref 3.87–5.11)
RDW: 14.4 % (ref 11.5–15.5)
WBC: 7.5 10*3/uL (ref 4.0–10.5)
nRBC: 0 % (ref 0.0–0.2)

## 2020-06-15 LAB — FOLATE: Folate: 8.4 ng/mL (ref >5.9)

## 2020-06-15 LAB — VITAMIN B12: Vitamin B-12: 549 pg/mL (ref 180–914)

## 2020-06-15 LAB — IRON AND TIBC
Iron: 58 ug/dL (ref 28–170)
Saturation Ratios: 19 % (ref 10.4–31.8)
TIBC: 304 ug/dL (ref 250–450)
UIBC: 246 ug/dL

## 2020-06-15 LAB — LACTATE DEHYDROGENASE: LDH: 115 U/L (ref 98–192)

## 2020-06-15 LAB — VITAMIN D 25 HYDROXY (VIT D DEFICIENCY, FRACTURES): Vit D, 25-Hydroxy: 39.89 ng/mL (ref 30–100)

## 2020-06-15 MED ORDER — HYDROCODONE-ACETAMINOPHEN 5-325 MG PO TABS
1.0000 | ORAL_TABLET | Freq: Two times a day (BID) | ORAL | 0 refills | Status: AC
Start: 2020-06-15 — End: ?

## 2020-06-15 MED ORDER — DENOSUMAB 60 MG/ML ~~LOC~~ SOSY
PREFILLED_SYRINGE | SUBCUTANEOUS | Status: AC
  Filled 2020-06-15: qty 1

## 2020-06-15 MED ORDER — DENOSUMAB 60 MG/ML ~~LOC~~ SOSY
60.0000 mg | PREFILLED_SYRINGE | Freq: Once | SUBCUTANEOUS | Status: AC
  Administered 2020-06-15: 60 mg via SUBCUTANEOUS

## 2020-06-15 MED ORDER — DENOSUMAB 60 MG/ML ~~LOC~~ SOLN: 60.0000 mg | Freq: Once | SUBCUTANEOUS | Status: DC

## 2020-06-15 NOTE — Patient Instructions (Signed)
[facility] at [facility] Discharge Instructions  Received Prolia injection today. Follow-up as scheduled   Thank you for choosing [facility] at [facility] to provide your oncology and hematology care.  To afford each patient quality time with our provider, please arrive at least 15 minutes before your scheduled appointment time.   If you have a lab appointment with the Cancer Center please come in thru the Main Entrance and check in at the main information desk.  You need to re-schedule your appointment should you arrive 10 or more minutes late.  We strive to give you quality time with our providers, and arriving late affects you and other patients whose appointments are after yours.  Also, if you no show three or more times for appointments you may be dismissed from the clinic at the providers discretion.     Again, thank you for choosing [facility].  Our hope is that these requests will decrease the amount of time that you wait before being seen by our physicians.       _____________________________________________________________  Should you have questions after your visit to [facility], please contact our office at [phone] and follow the prompts.  Our office hours are 8:00 a.m. and 4:30 p.m. Monday - Friday.  Please note that voicemails left after 4:00 p.m. may not be returned until the following business day.  We are closed weekends and major holidays.  You do have access to a nurse 24-7, just call the main number to the clinic [phone] and do not press any options, hold on the line and a nurse will answer the phone.    For prescription refill requests, have your pharmacy contact our office and allow 72 hours.    Due to Covid, you will need to wear a mask upon entering the hospital. If you do not have a mask, a mask will be given to you at the Main Entrance upon arrival. For doctor visits, patients may have  1 support person age 18 or older with them. For treatment visits, patients can not have anyone with them due to social distancing guidelines and our immunocompromised population.     

## 2020-06-15 NOTE — Progress Notes (Signed)
[name], [place] [address]   CLINIC:  Medical Oncology/Hematology  PCP:  [name], MD [place] [phone]   REASON FOR VISIT: Follow-up for breast cancer   CURRENT THERAPY: Anastrozole  BRIEF ONCOLOGIC HISTORY:  Oncology History  Breast cancer of upper-inner quadrant of left female breast ([name])  12/15/2014 Mammogram   new 49mm round nodule in left breast is indeterminate   12/17/2014 Imaging   6 mm lobulated nodule in the left breast is at low suspicion for malignancy. ultrasound guided biopsy recommended   12/23/2014 Procedure   (L) breast biopsy (done at [facility]): Invasive ductal carcinoma.  ER+ (100%), PR+ (99%), HER2 neg (ratio 1.61). Ki67 45%.    01/26/2015 Surgery   (L) lumpectomy with SLNB Dalbert Batman): IDC, grade 2, spanning 1.1 cm; also low-grade DCIS; margins negative. 0/3 left axillary SLN. HER2 repeated and remains neg. (ratio 1.33).  pT1c, pN0: Stage IA   03/19/2015 Genetic Testing   Genetic testing: Negative. Genes analyzed: ATM, BARD1, BRCA1, BRCA2, BRIP1, CDH1, CHEK2, FANCC, MLH1, MSH2, MSH6, NBN, PALB2, PMS2, PTEN, RAD51C, RAD51D, STK11, TP53, and XRCC2.  Additionally, this panel includes deletion/duplication analysis (without next-generation sequencing) of one gene, EPCAM.   03/2015 - 04/2015 Radiation Therapy   Adjuvant breast radiation with Dr. Pablo Ledger.    04/30/2015 Imaging   DEXA scan: Osteopenia. (T-Score of -1.1)   05/2015 -  Anti-estrogen oral therapy   Anastrozole. Planned duration of therapy 5-10 years.    07/16/2015 Miscellaneous   Prolia injections started.       INTERVAL HISTORY:  [name] 69 y.o. female returns for routine follow-up for breast cancer.  Patient reports she is doing well since her last visit.  She denies any new lumps or bumps present.  She denies any new bone pain.  She does report she fell last night and she is still little sore today.  Patient denies any  bright red bleeding per rectum or melena.  She denies any easy bruising or bleeding. Denies any nausea, vomiting, or diarrhea. Denies any new pains. Had not noticed any recent bleeding such as epistaxis, hematuria or hematochezia. Denies recent chest pain on exertion, shortness of breath on minimal exertion, pre-syncopal episodes, or palpitations. Denies any numbness or tingling in hands or feet. Denies any recent fevers, infections, or recent hospitalizations. Patient reports appetite at 0% and energy level at 0%.     REVIEW OF SYSTEMS:  Review of Systems  Constitutional: Positive for fatigue.  Gastrointestinal: Positive for nausea.  Musculoskeletal: Positive for myalgias.  Neurological: Positive for dizziness and headaches.  Psychiatric/Behavioral: Positive for depression and sleep disturbance.     PAST MEDICAL/SURGICAL HISTORY:  Past Medical History:  Diagnosis Date  . Adenomatous colon polyp 1993  . Allergy   . Anemia   . Anxiety disorder   . Asthma    Spirometry 1989: FEV1 2.12 (71%) with ratio 82.  HFA 90% after coaching 11-09-10  . Atrial flutter ([place]) chadsvasc of 3 (11/19/14)   Status post cardioversion 2-12 2013. s/p RFCA 5/13 with JAllred (([facility])  . Breast cancer ([place])    left  . COPD (chronic obstructive pulmonary disease) ([place])   . DDD (degenerative disc disease)   . Diabetes mellitus without complication ([place])   . Diverticulosis   . Essential hypertension, benign   . Fibromyalgia   . GERD (gastroesophageal reflux disease)    EGD with HH / esophagitis 06-30-93  . Hiatal hernia   .  Hyperlipidemia   . Internal and external hemorrhoids without complication   . Lipoma of colon   . Migraine   . Osteoarthritis   . Pelvic fracture ([place])   . Persistent atrial fibrillation ([place])   . Personal history of colonic polyps 06/14/2010  . Pneumonia   . PONV (postoperative nausea and vomiting)   . Radiation    left breast cancer  . Sleep apnea    cpap   Past Surgical  History:  Procedure Laterality Date  . A FLUTTER ABLATION  02/22/2012  . ATRIAL FIBRILLATION ABLATION N/A 11/25/2019   Procedure: ATRIAL FIBRILLATION ABLATION;  Surgeon: Thompson Grayer, MD;  Location: [facility];  Service: Cardiovascular;  Laterality: N/A;  . ATRIAL FLUTTER ABLATION N/A 02/22/2012   Procedure: ATRIAL FLUTTER ABLATION;  Surgeon: Thompson Grayer, MD;  Location: [facility] CATH LAB;  Service: Cardiovascular;  Laterality: N/A;  . BREAST BIOPSY Left 11/2014  . CARDIOVERSION N/A 02/19/2014   Procedure: CARDIOVERSION;  Surgeon: Dorothy Spark, MD;  Location: [facility];  Service: Cardiovascular;  Laterality: N/A;  . CARDIOVERSION N/A 07/22/2019   Procedure: CARDIOVERSION;  Surgeon: Josue Hector, MD;  Location: [facility] ENDOSCOPY;  Service: Cardiovascular;  Laterality: N/A;  . COLONOSCOPY  05/2010, 07/2000  . DILATION AND CURETTAGE OF UTERUS    . ESOPHAGOGASTRODUODENOSCOPY  05/2010, 06/1993  . JOINT REPLACEMENT    . POLYPECTOMY    . RADIOACTIVE SEED GUIDED PARTIAL MASTECTOMY WITH AXILLARY SENTINEL LYMPH NODE BIOPSY Left 01/26/2015   Procedure: LEFT  PARTIAL MASTECTOMY WITH  RADIOACTIVE SEED LOCALIZATION, LEFT AXILLARY SENTINEL LYMPH NODE BIOPSY;  Surgeon: Fanny Skates, MD;  Location: [facility];  Service: General;  Laterality: Left;  . ROTATOR CUFF REPAIR     bilateral  . ROTATOR CUFF REPAIR     bil  . TEE WITHOUT CARDIOVERSION N/A 11/24/2019   Procedure: TRANSESOPHAGEAL ECHOCARDIOGRAM (TEE);  Surgeon: Jerline Pain, MD;  Location: [facility] ENDOSCOPY;  Service: Cardiovascular;  Laterality: N/A;  . TOTAL KNEE ARTHROPLASTY     x2 09/2006  . UPPER GASTROINTESTINAL ENDOSCOPY       SOCIAL HISTORY:  Social History   Socioeconomic History  . Marital status: Widowed    Spouse name: Not on file  . Number of children: 0  . Years of education: Not on file  . Highest education level: Not on file  Occupational History  . Occupation: Database administrator at [employer]  Tobacco Use  . Smoking  status: Former Smoker    Packs/day: 1.00    Years: 20.00    Pack years: 20.00    Types: Cigarettes    Quit date: 09/25/1984    Years since quitting: 35.7  . Smokeless tobacco: Never Used  Vaping Use  . Vaping Use: Never used  Substance and Sexual Activity  . Alcohol use: No    Alcohol/week: 0.0 standard drinks  . Drug use: No  . Sexual activity: Not Currently  Other Topics Concern  . Not on file  Social History Narrative  . Not on file   Social Determinants of Health   Financial Resource Strain:   . Difficulty of Paying Living Expenses: Not on file  Food Insecurity:   . Worried About Charity fundraiser in the Last Year: Not on file  . Ran Out of Food in the Last Year: Not on file  Transportation Needs:   . Lack of Transportation (Medical): Not on file  . Lack of Transportation (Non-Medical): Not on file  Physical Activity:   .  Days of Exercise per Week: Not on file  . Minutes of Exercise per Session: Not on file  Stress:   . Feeling of Stress : Not on file  Social Connections:   . Frequency of Communication with Friends and Family: Not on file  . Frequency of Social Gatherings with Friends and Family: Not on file  . Attends Religious Services: Not on file  . Active Member of Clubs or Organizations: Not on file  . Attends Archivist Meetings: Not on file  . Marital Status: Not on file  Intimate Partner Violence:   . Fear of Current or Ex-Partner: Not on file  . Emotionally Abused: Not on file  . Physically Abused: Not on file  . Sexually Abused: Not on file    FAMILY HISTORY:  Family History  Problem Relation Age of Onset  . Emphysema Father        smoker  . Heart disease Father   . Breast cancer Mother 13  . Breast cancer Sister 18  . Heart Problems Sister   . Heart disease Sister   . Breast cancer Maternal Aunt 48  . Lung cancer Maternal Uncle 43       smoker  . Colon cancer Paternal Uncle 2  . Diabetes Maternal Grandmother   . Heart Problems  Maternal Grandfather   . Stroke Paternal Grandfather   . Lung cancer Maternal Uncle 72       smoker  . Heart Problems Paternal Uncle   . Diabetes Paternal Grandmother   . Rectal cancer Neg Hx   . Stomach cancer Neg Hx   . Esophageal cancer Neg Hx     CURRENT MEDICATIONS:  Outpatient Encounter Medications as of 06/15/2020  Medication Sig  . albuterol (PROVENTIL) (2.5 MG/3ML) 0.083% nebulizer solution Take 3 mLs (2.5 mg total) by nebulization every 4 (four) hours as needed for wheezing or shortness of breath (dx: J45.991).  . hydrOXYzine (ATARAX/VISTARIL) 25 MG tablet Take 25 mg by mouth every 6 (six) hours as needed for itching.   . promethazine (PHENERGAN) 25 MG tablet Take 25 mg by mouth in the morning and at bedtime.   . ALPRAZolam (XANAX) 0.5 MG tablet Take 0.5 mg by mouth 2 (two) times daily as needed for anxiety.   Marland Kitchen anastrozole (ARIMIDEX) 1 MG tablet Take 1 tablet (1 mg total) by mouth daily.  Marland Kitchen atorvastatin (LIPITOR) 10 MG tablet Take 10 mg by mouth daily.  . bisoprolol (ZEBETA) 10 MG tablet Take 0.5 tablets (5 mg total) by mouth daily.  Marland Kitchen BREO ELLIPTA 100-25 MCG/INH AEPB Inhale 1 puff into the lungs daily.  . calcium-vitamin D (OSCAL WITH D) 500-200 MG-UNIT per tablet Take 1 tablet by mouth daily with breakfast.  . celecoxib (CELEBREX) 200 MG capsule Take 200 mg by mouth every 3 (three) days. In the morning  . Cholecalciferol (VITAMIN D3) 10 MCG (400 UNIT) CAPS Take 400 Units by mouth daily.  Marland Kitchen diltiazem (CARTIA XT) 120 MG 24 hr capsule Take 1 capsule (120 mg total) by mouth daily.  . flecainide (TAMBOCOR) 100 MG tablet TAKE 1 TABLET BY MOUTH TWICE DAILY  . fluticasone (FLONASE) 50 MCG/ACT nasal spray Place 2 sprays into both nostrils daily as needed for allergies or rhinitis.  . furosemide (LASIX) 40 MG tablet Take 40 mg by mouth daily.   Marland Kitchen HYDROcodone-acetaminophen (NORCO/VICODIN) 5-325 MG tablet Take 1 tablet by mouth 2 (two) times daily. (Patient not taking: Reported on  03/12/2020)  . losartan (COZAAR) 100 MG  tablet Take 100 mg by mouth daily.   . metFORMIN (GLUCOPHAGE) 500 MG tablet Take 500 mg by mouth 2 (two) times daily with a meal.   . omeprazole (PRILOSEC) 40 MG capsule take 1 capsule by mouth once daily BEFORE BREAKFAST  . PARoxetine (PAXIL) 20 MG tablet Take 20 mg by mouth daily.  . potassium chloride (K-DUR) 10 MEQ tablet Take 10 mEq by mouth daily.   Marland Kitchen PROAIR RESPICLICK 108 (90 Base) MCG/ACT AEPB INHALE 2 PUFFS BY MOUTH EVERY 4 HOURS AS NEEDED (Patient not taking: Reported on 03/12/2020)  . vitamin E 1000 UNIT capsule Take 1,000 Units by mouth daily.  Carlena Hurl 20 MG TABS tablet TAKE 1 TABLET BY MOUTH EVERY DAY WITH SUPPER  . [DISCONTINUED] SYMBICORT 160-4.5 MCG/ACT inhaler INHALE 2 PUFFS INTO THE LUNGS TWICE DAILY (Patient not taking: Reported on 06/15/2020)   Facility-Administered Encounter Medications as of 06/15/2020  Medication  . 0.9 %  sodium chloride infusion  . ferumoxytol (FERAHEME) 510 mg in sodium chloride 0.9 % 100 mL IVPB    ALLERGIES:  Allergies  Allergen Reactions  . Shellfish Allergy Hives and Shortness Of Breath  . Ace Inhibitors Cough  . Aminophylline Nausea And Vomiting    Heart races and pounds  . Epinephrine Other (See Comments)    Pallpitations during dental procedures  . Theophyllines Nausea And Vomiting and Other (See Comments)    Heart races and pounds  . Propoxyphene Hcl Nausea And Vomiting     PHYSICAL EXAM:  ECOG Performance status: 1  Vitals:   06/15/20 1256  BP: (!) 119/46  Pulse: 95  Resp: 18  Temp: (!) 97.1 F (36.2 C)  SpO2: 98%   Filed Weights   Physical Exam Constitutional:      Appearance: She is obese.  Cardiovascular:     Rate and Rhythm: Normal rate and regular rhythm.     Heart sounds: Normal heart sounds.  Pulmonary:     Effort: Pulmonary effort is normal.     Breath sounds: Normal breath sounds.  Abdominal:     General: Bowel sounds are normal.     Palpations: Abdomen is soft.   Musculoskeletal:        General: Normal range of motion.  Skin:    General: Skin is warm.  Neurological:     Mental Status: She is alert and oriented to person, place, and time. Mental status is at baseline.  Psychiatric:        Mood and Affect: Mood normal.        Behavior: Behavior normal.        Thought Content: Thought content normal.        Judgment: Judgment normal.      LABORATORY DATA:  I have reviewed the labs as listed.  CBC    Component Value Date/Time   WBC 7.5 06/15/2020 1200   RBC 3.39 (L) 06/15/2020 1200   HGB 10.0 (L) 06/15/2020 1200   HCT 32.8 (L) 06/15/2020 1200   PLT 364 06/15/2020 1200   MCV 96.8 06/15/2020 1200   MCH 29.5 06/15/2020 1200   MCHC 30.5 06/15/2020 1200   RDW 14.4 06/15/2020 1200   LYMPHSABS 0.9 06/15/2020 1200   MONOABS 0.5 06/15/2020 1200   EOSABS 0.3 06/15/2020 1200   BASOSABS 0.1 06/15/2020 1200   CMP Latest Ref Rng & Units 06/15/2020 03/05/2020 12/05/2019  Glucose 70 - 99 mg/dL 327(M) 147(W) 929(V)  BUN 8 - 23 mg/dL 21 74(B) 23  Creatinine 0.44 - 1.00  mg/dL 1.51(H) 1.91(H) 1.72(H)  Sodium 135 - 145 mmol/L 137 140 140  Potassium 3.5 - 5.1 mmol/L 3.4(L) 4.2 3.5  Chloride 98 - 111 mmol/L 98 101 102  CO2 22 - 32 mmol/L $RemoveB'26 25 27  'LgVVdXxn$ Calcium 8.9 - 10.3 mg/dL 9.5 8.4(L) 8.9  Total Protein 6.5 - 8.1 g/dL 6.9 7.5 7.6  Total Bilirubin 0.3 - 1.2 mg/dL 0.6 0.5 0.7  Alkaline Phos 38 - 126 U/L 58 54 60  AST 15 - 41 U/L 17 13(L) 11(L)  ALT 0 - 44 U/L $Remo'11 13 12    'WFGMI$ DIAGNOSTIC IMAGING:  I have independently reviewed the mammogram scans and discussed with the patient.  ASSESSMENT & PLAN:  Breast cancer of upper-inner quadrant of left female breast 1.  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 - She was treated with a left breast lumpectomy by Dr. Dalbert Batman. - Followed by adjuvant radiation therapy with Dr. Pablo Ledger.  Completed radiation  04/2015. - She started antiestrogen therapy with anastrozole in 05/2015.  The plan is to continue this therapy for 10 years. - Her Oncotype DX was low risk score.  Patient was to continue therapy for 10 years. - Her mammogram will be done 06/08/2020 was B RADS category 2 benign.  Mammogram was done with [facility]. -Labs done on 06/15/2020 showed WBC 7.5, hemoglobin 10.0, platelets 364.   -She will continue to take her anastrozole. - We will continue to follow her every 6 months with labs.  2.  Osteopenia: - Her last DEXA scan done on 04/2018 showed osteopenia, with a T score of -1.4 -She is receiving Prolia every 6 months.  -She received her injection today 12/05/2019 -She is continuing to take calcium and vitamin D.  3.  Anemia: -Likely due from CKD. -Last Feraheme infusion was on 01/23/2020 and 02/05/2020 -Labs done on 06/15/2020 showed hemoglobin 10.0, ferritin 246, percent saturation 19 -She will get 2 more iron infusions. - She will return to the clinic in 5 months for follow-up labs.  5.  B12 deficiency: - Labs on 01/24/2019 showed her B12 at 185. -Labs on 05/20/2019 showed her B12 level came up to 1376. - She is supposed to be given herself monthly B12 injections however she forgets to do this often. - I have started her on oral B12 daily to see if that helps since she forgets the injections and does not like doing them. -Labs done on 06/15/2020 showed B12 549. -We will follow-up with labs in 5 months.  6.  Vitamin D deficiency: - Labs on 01/24/2019 showed her vitamin D level 29.5. -She takes vitamin D daily. - I have increased her dose to twice a day. -Labs done on 03/05/2020 shows vitamin D level 42.04 - We will follow-up in 3 months with labs  7.  Atrial flutter: -Patient is on Xarelto. -Patient follows up with PCP or cardiology as recommended.  8.  Fall: -Patient reports she fell last night at home. -She reports that she is sore and requesting pain medication. -We will call her in a  week supply.     Orders placed this encounter:  Orders Placed This Encounter  Procedures  . CBC with Differential/Platelet  . Comprehensive metabolic panel  . Ferritin  . Iron and TIBC  . Lactate dehydrogenase  . Vitamin B12  . VITAMIN D 25 Hydroxy (Vit-D Deficiency, Fractures)      Francene Finders, FNP-C [facility] [phone]

## 2020-06-15 NOTE — Patient Instructions (Signed)
[facility] at [facility] Discharge Instructions  You saw Randi Lockamy, NP, today.   Thank you for choosing [facility] at [facility] to provide your oncology and hematology care.  To afford each patient quality time with our provider, please arrive at least 15 minutes before your scheduled appointment time.   If you have a lab appointment with the Cancer Center please come in thru the Main Entrance and check in at the main information desk.  You need to re-schedule your appointment should you arrive 10 or more minutes late.  We strive to give you quality time with our providers, and arriving late affects you and other patients whose appointments are after yours.  Also, if you no show three or more times for appointments you may be dismissed from the clinic at the providers discretion.     Again, thank you for choosing [facility].  Our hope is that these requests will decrease the amount of time that you wait before being seen by our physicians.       _____________________________________________________________  Should you have questions after your visit to [facility], please contact our office at [phone] and follow the prompts.  Our office hours are 8:00 a.m. and 4:30 p.m. Monday - Friday.  Please note that voicemails left after 4:00 p.m. may not be returned until the following business day.  We are closed weekends and major holidays.  You do have access to a nurse 24-7, just call the main number to the clinic [phone] and do not press any options, hold on the line and a nurse will answer the phone.    For prescription refill requests, have your pharmacy contact our office and allow 72 hours.    Due to Covid, you will need to wear a mask upon entering the hospital. If you do not have a mask, a mask will be given to you at the Main Entrance upon arrival. For doctor visits, patients may have 1 support person age  18 or older with them. For treatment visits, patients can not have anyone with them due to social distancing guidelines and our immunocompromised population.     

## 2020-06-15 NOTE — Assessment & Plan Note (Signed)
1.  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 - She was treated with a left breast lumpectomy by Dr. Dalbert Batman. - Followed by adjuvant radiation therapy with Dr. Pablo Ledger.  Completed radiation 04/2015. - She started antiestrogen therapy with anastrozole in 05/2015.  The plan is to continue this therapy for 10 years. - Her Oncotype DX was low risk score.  Patient was to continue therapy for 10 years. - Her mammogram will be done 06/08/2020 was B RADS category 2 benign.  Mammogram was done with [facility]. -Labs done on 06/15/2020 showed WBC 7.5, hemoglobin 10.0, platelets 364.   -She will continue to take her anastrozole. - We will continue to follow her every 6 months with labs.  2.  Osteopenia: - Her last DEXA scan done on 04/2018 showed osteopenia, with a T score of -1.4 -She is receiving Prolia every 6 months.  -She received her injection today 12/05/2019 -She is continuing to take calcium and vitamin D.  3.  Anemia: -Likely due from CKD. -Last Feraheme infusion was on 01/23/2020 and 02/05/2020 -Labs done on 06/15/2020 showed hemoglobin 10.0, ferritin 246, percent saturation 19 -She will get 2 more iron infusions. - She will return to the clinic in 5 months for follow-up labs.  5.  B12 deficiency: - Labs on 01/24/2019 showed her B12 at 185. -Labs on 05/20/2019 showed her B12 level came up to 1376. - She is supposed to be given herself monthly B12 injections however she forgets to do this often. - I have started her on oral B12 daily to see if that helps since she forgets the injections and does not like doing them. -Labs done on 06/15/2020 showed B12 549. -We will follow-up with labs in 5 months.  6.  Vitamin D deficiency: - Labs on 01/24/2019 showed her vitamin D level 29.5. -She takes vitamin D daily. - I have increased her dose to twice a day. -Labs done on 03/05/2020 shows vitamin D level  42.04 - We will follow-up in 3 months with labs  7.  Atrial flutter: -Patient is on Xarelto. -Patient follows up with PCP or cardiology as recommended.  8.  Fall: -Patient reports she fell last night at home. -She reports that she is sore and requesting pain medication. -We will call her in a week supply.

## 2020-06-15 NOTE — Progress Notes (Unsigned)
Referral sent to Dr Ethelene Hal. Records faxed on 9/21

## 2020-06-15 NOTE — Progress Notes (Signed)
[place] reviewed with and pt seen by RLockamy NP and pt approved for Prolia injection today per NP                                      [name] tolerated Prolia injection well without complaints or incident. Calcium 9.5 today and pt denied any tooth or jaw pain and no recent or future dental visits prior to administering this medication. Pt continues to take her Calcium PO as prescribed without issues. Pt discharged via wheelchair in satisfactory condition

## 2020-06-23 ENCOUNTER — Other Ambulatory Visit: Payer: Self-pay

## 2020-06-23 ENCOUNTER — Inpatient Hospital Stay (HOSPITAL_COMMUNITY): Payer: Medicare Other

## 2020-06-23 VITALS — BP 138/70 | HR 85 | Temp 97.2°F | Resp 18

## 2020-06-23 DIAGNOSIS — Z79899 Other long term (current) drug therapy: Secondary | ICD-10-CM

## 2020-06-23 DIAGNOSIS — C50212 Malignant neoplasm of upper-inner quadrant of left female breast: Secondary | ICD-10-CM | POA: Diagnosis not present

## 2020-06-23 DIAGNOSIS — M8580 Other specified disorders of bone density and structure, unspecified site: Secondary | ICD-10-CM

## 2020-06-23 MED ORDER — SODIUM CHLORIDE 0.9 % IV SOLN: INTRAVENOUS | Status: DC

## 2020-06-23 MED ORDER — SODIUM CHLORIDE 0.9 % IV SOLN
510.0000 mg | Freq: Once | INTRAVENOUS | Status: AC
  Administered 2020-06-23: 510 mg via INTRAVENOUS
  Filled 2020-06-23: qty 510

## 2020-06-23 MED ORDER — CYANOCOBALAMIN 1000 MCG/ML IJ SOLN: 1000.0000 ug | Freq: Once | INTRAMUSCULAR | Status: DC

## 2020-06-23 NOTE — Patient Instructions (Signed)
[facility] at [facility]  Discharge Instructions:   _______________________________________________________________  Thank you for choosing [facility] at [facility] to provide your oncology and hematology care.  To afford each patient quality time with our providers, please arrive at least 15 minutes before your scheduled appointment.  You need to re-schedule your appointment if you arrive 10 or more minutes late.  We strive to give you quality time with our providers, and arriving late affects you and other patients whose appointments are after yours.  Also, if you no show three or more times for appointments you may be dismissed from the clinic.  Again, thank you for choosing [facility] at [facility]. Our hope is that these requests will allow you access to exceptional care and in a timely manner. _______________________________________________________________  If you have questions after your visit, please contact our office at [phone] between the hours of 8:30 a.m. and 5:00 p.m. Voicemails left after 4:30 p.m. will not be returned until the following business day. _______________________________________________________________  For prescription refill requests, have your pharmacy contact our office. _______________________________________________________________  Recommendations made by the consultant and any test results will be sent to your referring physician. _______________________________________________________________ 

## 2020-06-23 NOTE — Progress Notes (Signed)
Iron infusion given per orders. Patient tolerated it well without problems. Vitals stable and discharged home from clinic via wheelchair in stable condtion. Follow up as scheduled.

## 2020-06-30 ENCOUNTER — Other Ambulatory Visit: Payer: Self-pay

## 2020-06-30 ENCOUNTER — Inpatient Hospital Stay (HOSPITAL_COMMUNITY): Payer: Medicare Other | Attending: Hematology

## 2020-06-30 VITALS — BP 174/85 | HR 108 | Temp 97.3°F | Resp 20

## 2020-06-30 DIAGNOSIS — D631 Anemia in chronic kidney disease: Secondary | ICD-10-CM | POA: Diagnosis present

## 2020-06-30 DIAGNOSIS — Z79899 Other long term (current) drug therapy: Secondary | ICD-10-CM

## 2020-06-30 DIAGNOSIS — N189 Chronic kidney disease, unspecified: Secondary | ICD-10-CM | POA: Diagnosis present

## 2020-06-30 DIAGNOSIS — M8580 Other specified disorders of bone density and structure, unspecified site: Secondary | ICD-10-CM

## 2020-06-30 DIAGNOSIS — Z170 Estrogen receptor positive status [ER+]: Secondary | ICD-10-CM

## 2020-06-30 MED ORDER — SODIUM CHLORIDE 0.9 % IV SOLN
510.0000 mg | Freq: Once | INTRAVENOUS | Status: AC
  Administered 2020-06-30: 510 mg via INTRAVENOUS
  Filled 2020-06-30: qty 510

## 2020-06-30 MED ORDER — SODIUM CHLORIDE 0.9 % IV SOLN: Freq: Once | INTRAVENOUS | Status: AC

## 2020-06-30 NOTE — Progress Notes (Signed)
Iron infusion given per orders. Patient tolerated it well without problems. Vitals stable and discharged home from clinic via wheelchair in stable condition.  Follow up as scheduled.

## 2020-06-30 NOTE — Patient Instructions (Signed)
[facility]  Discharge Instructions:   _______________________________________________________________  Thank you for choosing [facility] to provide your oncology and hematology care.  To afford each patient quality time with our providers, please arrive at least 15 minutes before your scheduled appointment.  You need to re-schedule your appointment if you arrive 10 or more minutes late.  We strive to give you quality time with our providers, and arriving late affects you and other patients whose appointments are after yours.  Also, if you no show three or more times for appointments you may be dismissed from the clinic.  Again, thank you for choosing [facility]. Our hope is that these requests will allow you access to exceptional care and in a timely manner. _______________________________________________________________  If you have questions after your visit, please contact our office at [phone] between the hours of 8:30 a.m. and 5:00 p.m. Voicemails left after 4:30 p.m. will not be returned until the following business day. _______________________________________________________________  For prescription refill requests, have your pharmacy contact our office. _______________________________________________________________  Recommendations made by the consultant and any test results will be sent to your referring physician. _______________________________________________________________ 

## 2020-07-08 ENCOUNTER — Other Ambulatory Visit: Payer: Self-pay | Admitting: Nephrology

## 2020-07-08 ENCOUNTER — Other Ambulatory Visit: Payer: Self-pay | Admitting: Gastroenterology

## 2020-07-08 ENCOUNTER — Other Ambulatory Visit (HOSPITAL_COMMUNITY): Payer: Self-pay | Admitting: Gastroenterology

## 2020-07-08 DIAGNOSIS — I129 Hypertensive chronic kidney disease with stage 1 through stage 4 chronic kidney disease, or unspecified chronic kidney disease: Secondary | ICD-10-CM

## 2020-07-13 ENCOUNTER — Encounter (HOSPITAL_COMMUNITY): Payer: Self-pay

## 2020-07-13 ENCOUNTER — Ambulatory Visit (HOSPITAL_COMMUNITY): Admission: RE | Admit: 2020-07-13 | Payer: Medicare Other | Source: Ambulatory Visit

## 2020-07-28 ENCOUNTER — Ambulatory Visit (HOSPITAL_COMMUNITY)
Admission: RE | Admit: 2020-07-28 | Discharge: 2020-07-28 | Disposition: A | Discharge status: Home / Self Care | Payer: Medicare Other | Source: Ambulatory Visit | Attending: Nephrology | Admitting: Nephrology

## 2020-07-28 ENCOUNTER — Other Ambulatory Visit: Payer: Self-pay

## 2020-07-28 DIAGNOSIS — I129 Hypertensive chronic kidney disease with stage 1 through stage 4 chronic kidney disease, or unspecified chronic kidney disease: Secondary | ICD-10-CM | POA: Diagnosis not present

## 2020-08-21 ENCOUNTER — Other Ambulatory Visit: Payer: Self-pay | Admitting: Internal Medicine

## 2020-08-21 DIAGNOSIS — I480 Paroxysmal atrial fibrillation: Secondary | ICD-10-CM

## 2020-08-23 NOTE — Telephone Encounter (Signed)
Age 69, weight 119kg, SCr 1.51 on 06/15/20, CrCl 41mL/min Last visit June 2021, afib indication

## 2020-09-25 DEATH — deceased

## 2020-12-14 ENCOUNTER — Ambulatory Visit (HOSPITAL_COMMUNITY): Payer: Medicare Other

## 2020-12-14 ENCOUNTER — Ambulatory Visit (HOSPITAL_COMMUNITY): Payer: Medicare Other | Admitting: Hematology

## 2020-12-14 ENCOUNTER — Other Ambulatory Visit (HOSPITAL_COMMUNITY): Payer: Medicare Other

## 2022-09-03 IMAGING — US US RENAL
1 series · 14 of 25 positions shown · non-contrast
Comparison: None.

CLINICAL DATA: Malignant hypertensive renal disease.

EXAM:
RENAL / URINARY TRACT ULTRASOUND COMPLETE

[Series 1: us renal · 14 of 50 slices shown]
[im 1/50]
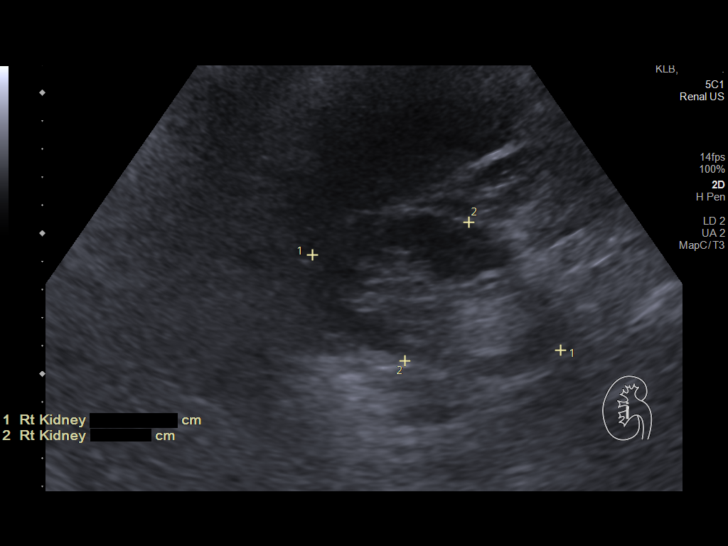
[im 5/50]
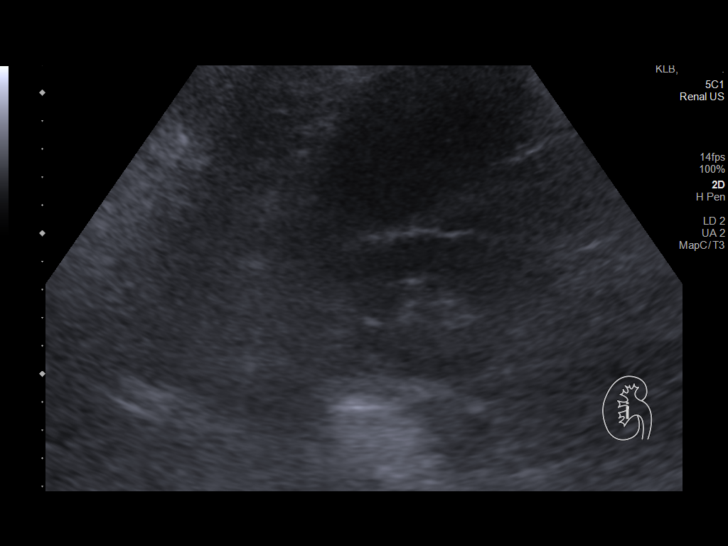
[im 9/50]
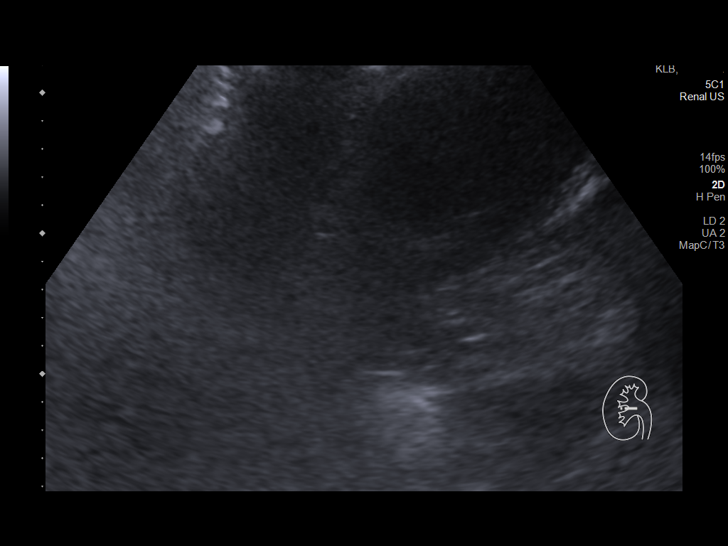
[im 13/50]
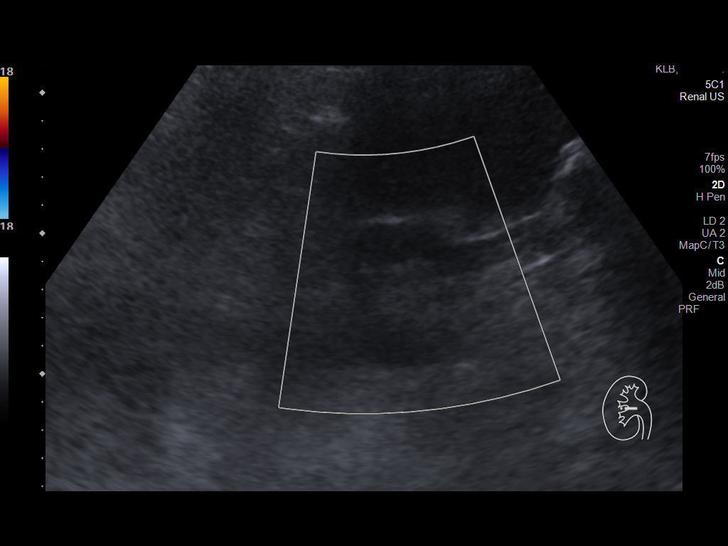
[im 17/50]
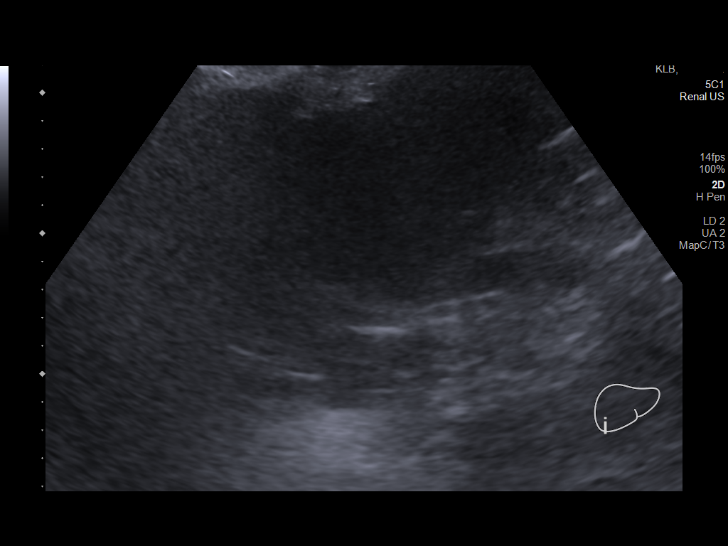
[im 19/50]
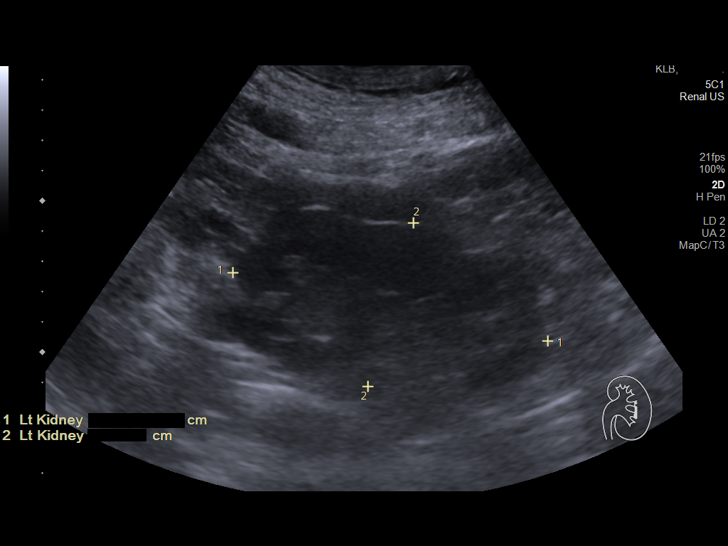
[im 23/50]
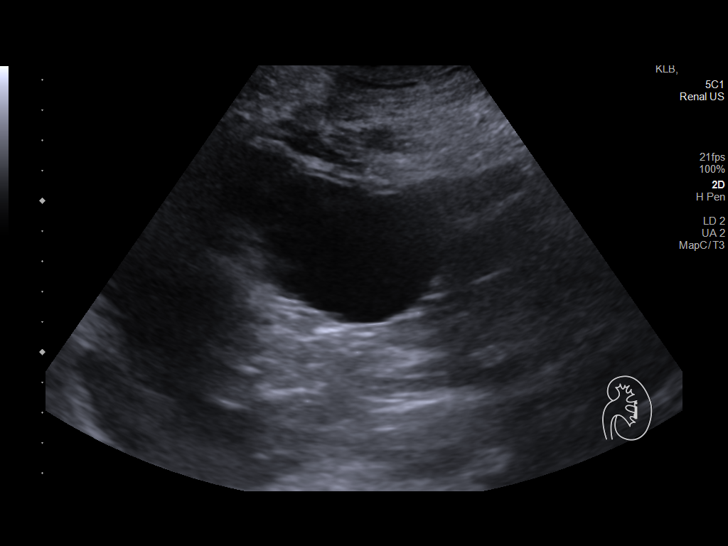
[im 27/50]
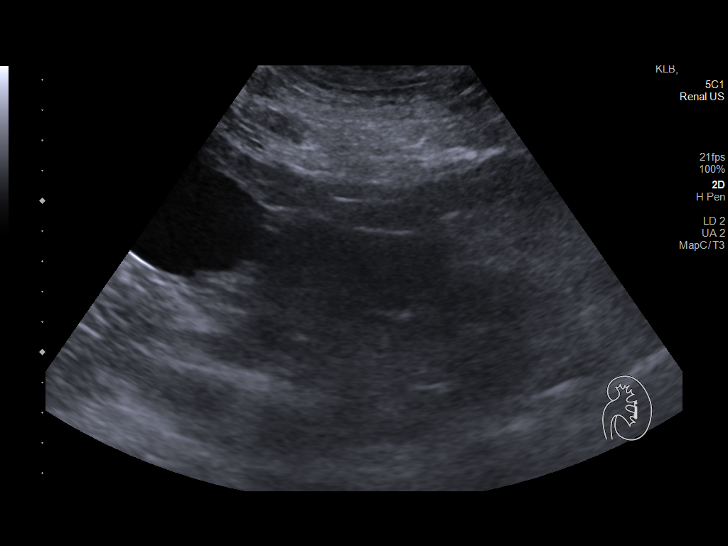
[im 31/50]
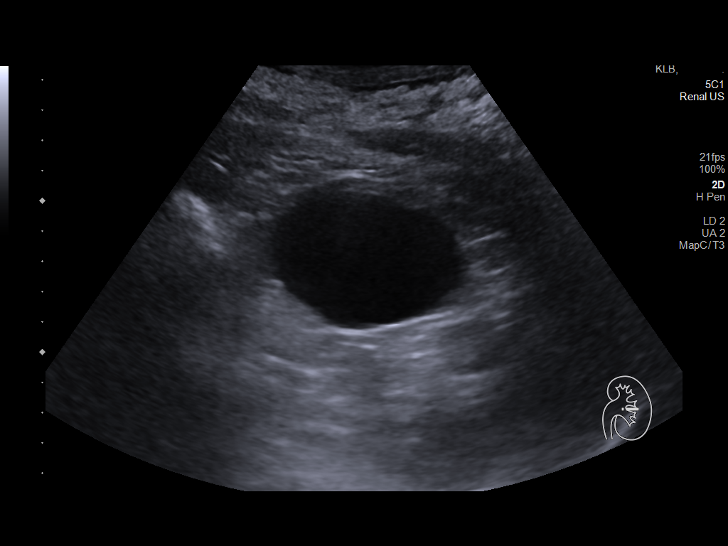
[im 33/50]
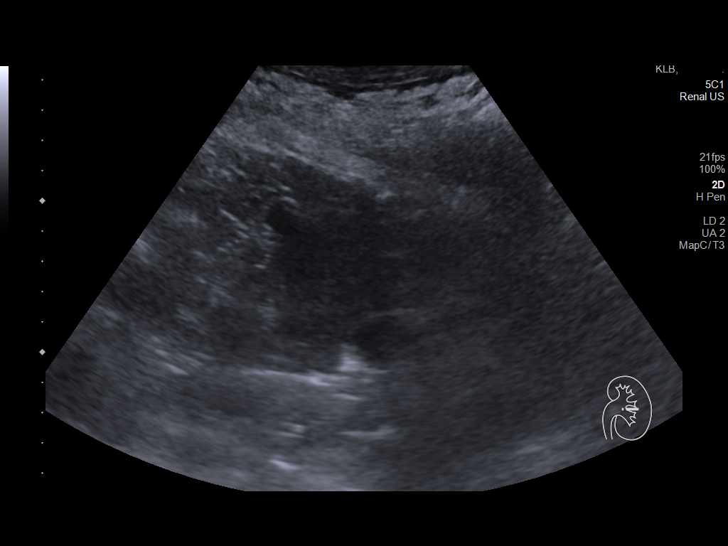
[im 37/50]
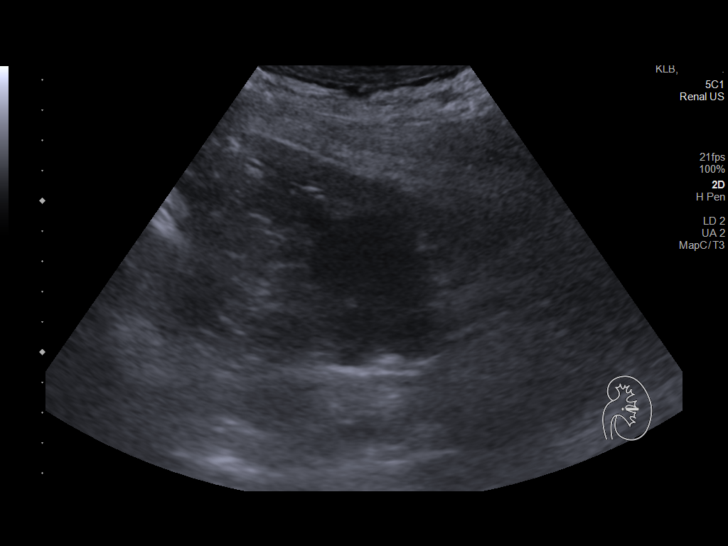
[im 41/50]
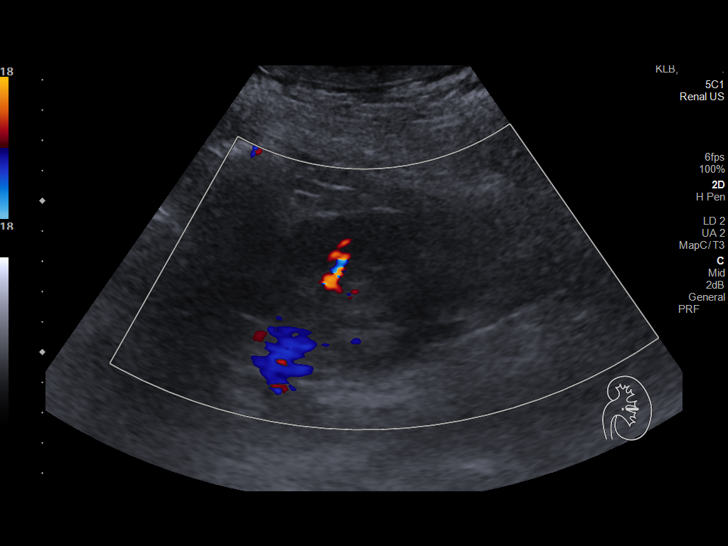
[im 45/50]
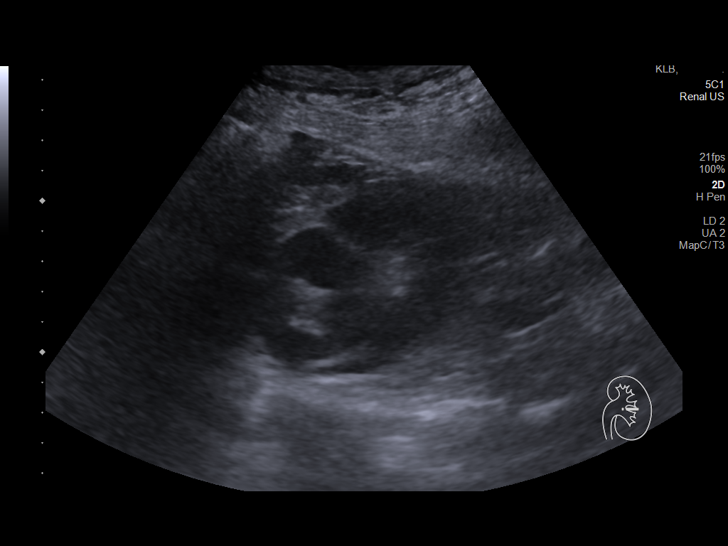
[im 50/50]
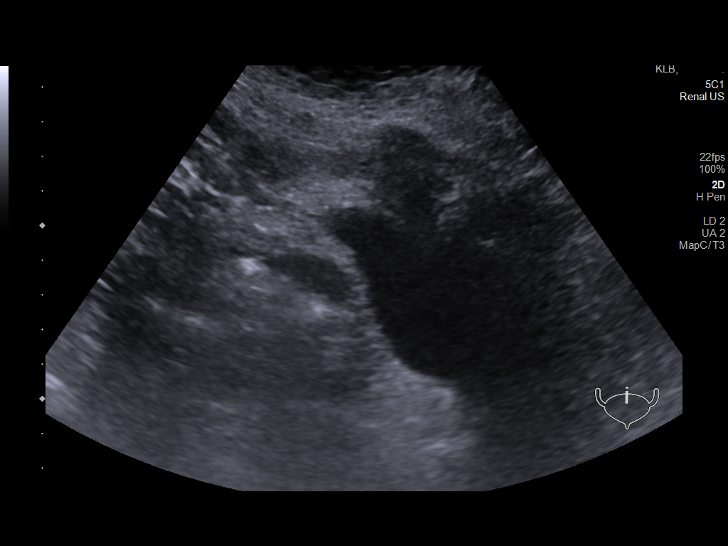

[14 of 25 positions shown; findings below may reference images not displayed]

FINDINGS: Right Kidney:

Renal measurements: 9.5 x 5.4 x 4.6 cm = volume: 123 mL. Technically
limited evaluation due to habitus. Mild thinning of the renal
parenchyma. Echogenicity grossly normal. No mass. No evidence of
focal renal lesion.

Left Kidney:

Renal measurements: 10.7 x 5.6 x 4.9 cm = volume: 152 mL.
Technically limited evaluation due to habitus. Echogenicity grossly
normal. No hydronephrosis. Exophytic cyst measuring 2.1 cm from the
upper kidney. There is an adjacent exophytic 6.7 cm cyst. Less than
10 cysts visualized in the kidney. No evidence of solid lesion.

Bladder:

Appears normal for degree of bladder distention. Both ureteral jets
are visualized.

Other:

None.
IMPRESSION: 1. No hydronephrosis or obstructive uropathy.
2. Mild thinning of the right renal parenchyma.
3. Left renal cysts.
